# Patient Record
Sex: Female | Born: 1950 | Race: White | Hispanic: No | Marital: Married | Smoking: Former smoker
Health system: Southern US, Community
[De-identification: ages and names within clinical notes are randomized; demographics above are authoritative.]

## PROBLEM LIST (undated history)

## (undated) DIAGNOSIS — K5790 Diverticulosis of intestine, part unspecified, without perforation or abscess without bleeding: Secondary | ICD-10-CM

## (undated) DIAGNOSIS — J42 Unspecified chronic bronchitis: Secondary | ICD-10-CM

## (undated) DIAGNOSIS — Z5189 Encounter for other specified aftercare: Secondary | ICD-10-CM

## (undated) DIAGNOSIS — F324 Major depressive disorder, single episode, in partial remission: Secondary | ICD-10-CM

## (undated) DIAGNOSIS — M810 Age-related osteoporosis without current pathological fracture: Secondary | ICD-10-CM

## (undated) DIAGNOSIS — E119 Type 2 diabetes mellitus without complications: Secondary | ICD-10-CM

## (undated) DIAGNOSIS — E782 Mixed hyperlipidemia: Secondary | ICD-10-CM

## (undated) DIAGNOSIS — K859 Acute pancreatitis without necrosis or infection, unspecified: Secondary | ICD-10-CM

## (undated) DIAGNOSIS — K219 Gastro-esophageal reflux disease without esophagitis: Secondary | ICD-10-CM

## (undated) DIAGNOSIS — R011 Cardiac murmur, unspecified: Secondary | ICD-10-CM

## (undated) DIAGNOSIS — K2270 Barrett's esophagus without dysplasia: Secondary | ICD-10-CM

## (undated) DIAGNOSIS — G4733 Obstructive sleep apnea (adult) (pediatric): Secondary | ICD-10-CM

## (undated) DIAGNOSIS — M797 Fibromyalgia: Secondary | ICD-10-CM

## (undated) DIAGNOSIS — Z9989 Dependence on other enabling machines and devices: Secondary | ICD-10-CM

## (undated) DIAGNOSIS — K222 Esophageal obstruction: Secondary | ICD-10-CM

## (undated) DIAGNOSIS — G43909 Migraine, unspecified, not intractable, without status migrainosus: Secondary | ICD-10-CM

## (undated) DIAGNOSIS — M545 Low back pain, unspecified: Secondary | ICD-10-CM

## (undated) DIAGNOSIS — I10 Essential (primary) hypertension: Secondary | ICD-10-CM

## (undated) DIAGNOSIS — J45909 Unspecified asthma, uncomplicated: Secondary | ICD-10-CM

## (undated) DIAGNOSIS — I219 Acute myocardial infarction, unspecified: Secondary | ICD-10-CM

## (undated) DIAGNOSIS — N189 Chronic kidney disease, unspecified: Secondary | ICD-10-CM

## (undated) DIAGNOSIS — K589 Irritable bowel syndrome without diarrhea: Secondary | ICD-10-CM

## (undated) DIAGNOSIS — K529 Noninfective gastroenteritis and colitis, unspecified: Secondary | ICD-10-CM

## (undated) DIAGNOSIS — K760 Fatty (change of) liver, not elsewhere classified: Secondary | ICD-10-CM

## (undated) DIAGNOSIS — J189 Pneumonia, unspecified organism: Secondary | ICD-10-CM

## (undated) DIAGNOSIS — G4730 Sleep apnea, unspecified: Secondary | ICD-10-CM

## (undated) DIAGNOSIS — Z8719 Personal history of other diseases of the digestive system: Secondary | ICD-10-CM

## (undated) DIAGNOSIS — M1990 Unspecified osteoarthritis, unspecified site: Secondary | ICD-10-CM

## (undated) DIAGNOSIS — D649 Anemia, unspecified: Secondary | ICD-10-CM

## (undated) DIAGNOSIS — F419 Anxiety disorder, unspecified: Secondary | ICD-10-CM

## (undated) DIAGNOSIS — I2510 Atherosclerotic heart disease of native coronary artery without angina pectoris: Secondary | ICD-10-CM

## (undated) DIAGNOSIS — G8929 Other chronic pain: Secondary | ICD-10-CM

## (undated) DIAGNOSIS — H269 Unspecified cataract: Secondary | ICD-10-CM

## (undated) HISTORY — DX: Anxiety disorder, unspecified: F41.9

## (undated) HISTORY — DX: Noninfective gastroenteritis and colitis, unspecified: K52.9

## (undated) HISTORY — DX: Acute myocardial infarction, unspecified: I21.9

## (undated) HISTORY — DX: Age-related osteoporosis without current pathological fracture: M81.0

## (undated) HISTORY — DX: Esophageal obstruction: K22.2

## (undated) HISTORY — DX: Fibromyalgia: M79.7

## (undated) HISTORY — PX: CATARACT EXTRACTION W/ INTRAOCULAR LENS  IMPLANT, BILATERAL: SHX1307

## (undated) HISTORY — DX: Gastro-esophageal reflux disease without esophagitis: K21.9

## (undated) HISTORY — DX: Essential (primary) hypertension: I10

## (undated) HISTORY — DX: Major depressive disorder, single episode, in partial remission: F32.4

## (undated) HISTORY — PX: ESOPHAGEAL DILATION: SHX303

## (undated) HISTORY — DX: Atherosclerotic heart disease of native coronary artery without angina pectoris: I25.10

## (undated) HISTORY — DX: Mixed hyperlipidemia: E78.2

## (undated) HISTORY — DX: Fatty (change of) liver, not elsewhere classified: K76.0

## (undated) HISTORY — PX: CARPAL TUNNEL RELEASE: SHX101

## (undated) HISTORY — DX: Unspecified asthma, uncomplicated: J45.909

## (undated) HISTORY — PX: APPENDECTOMY: SHX54

## (undated) HISTORY — PX: ABDOMINAL HYSTERECTOMY: SHX81

## (undated) HISTORY — DX: Type 2 diabetes mellitus without complications: E11.9

## (undated) HISTORY — DX: Unspecified cataract: H26.9

## (undated) HISTORY — DX: Irritable bowel syndrome, unspecified: K58.9

## (undated) HISTORY — DX: Chronic kidney disease, unspecified: N18.9

## (undated) HISTORY — DX: Sleep apnea, unspecified: G47.30

## (undated) HISTORY — PX: UPPER GASTROINTESTINAL ENDOSCOPY: SHX188

## (undated) HISTORY — DX: Encounter for other specified aftercare: Z51.89

---

## 1969-03-20 HISTORY — PX: DILATION AND CURETTAGE OF UTERUS: SHX78

## 1969-03-20 HISTORY — PX: NASAL SEPTUM SURGERY: SHX37

## 1969-07-19 HISTORY — PX: HUMERUS FRACTURE SURGERY: SHX670

## 1970-07-20 HISTORY — PX: BUNIONECTOMY: SHX129

## 1983-07-21 HISTORY — PX: TUBAL LIGATION: SHX77

## 1989-03-20 DIAGNOSIS — J189 Pneumonia, unspecified organism: Secondary | ICD-10-CM

## 1989-03-20 HISTORY — PX: TOE SURGERY: SHX1073

## 1989-03-20 HISTORY — PX: CHOLECYSTECTOMY: SHX55

## 1989-03-20 HISTORY — DX: Pneumonia, unspecified organism: J18.9

## 2000-11-29 ENCOUNTER — Ambulatory Visit (HOSPITAL_COMMUNITY): Admission: RE | Admit: 2000-11-29 | Discharge: 2000-11-29 | Payer: Self-pay | Admitting: Cardiology

## 2002-11-03 ENCOUNTER — Encounter: Payer: Self-pay | Admitting: Unknown Physician Specialty

## 2002-11-03 ENCOUNTER — Encounter: Admission: RE | Admit: 2002-11-03 | Discharge: 2002-11-03 | Payer: Self-pay | Admitting: Unknown Physician Specialty

## 2004-05-27 ENCOUNTER — Ambulatory Visit: Payer: Self-pay | Admitting: Cardiology

## 2004-06-03 ENCOUNTER — Ambulatory Visit: Payer: Self-pay | Admitting: Cardiology

## 2005-02-18 ENCOUNTER — Encounter: Admission: RE | Admit: 2005-02-18 | Discharge: 2005-02-18 | Payer: Self-pay | Admitting: Orthopedic Surgery

## 2005-02-19 ENCOUNTER — Ambulatory Visit (HOSPITAL_BASED_OUTPATIENT_CLINIC_OR_DEPARTMENT_OTHER): Admission: RE | Admit: 2005-02-19 | Discharge: 2005-02-19 | Payer: Self-pay | Admitting: Orthopedic Surgery

## 2005-02-19 ENCOUNTER — Ambulatory Visit (HOSPITAL_COMMUNITY): Admission: RE | Admit: 2005-02-19 | Discharge: 2005-02-19 | Payer: Self-pay | Admitting: Orthopedic Surgery

## 2005-03-13 ENCOUNTER — Ambulatory Visit (HOSPITAL_COMMUNITY): Admission: RE | Admit: 2005-03-13 | Discharge: 2005-03-13 | Payer: Self-pay | Admitting: Orthopedic Surgery

## 2005-03-13 ENCOUNTER — Ambulatory Visit (HOSPITAL_BASED_OUTPATIENT_CLINIC_OR_DEPARTMENT_OTHER): Admission: RE | Admit: 2005-03-13 | Discharge: 2005-03-13 | Payer: Self-pay | Admitting: Orthopedic Surgery

## 2005-04-02 ENCOUNTER — Ambulatory Visit: Payer: Self-pay | Admitting: Psychiatry

## 2005-05-05 ENCOUNTER — Ambulatory Visit: Payer: Self-pay | Admitting: Psychiatry

## 2006-06-01 ENCOUNTER — Ambulatory Visit (HOSPITAL_BASED_OUTPATIENT_CLINIC_OR_DEPARTMENT_OTHER): Admission: RE | Admit: 2006-06-01 | Discharge: 2006-06-01 | Payer: Self-pay | Admitting: Urology

## 2006-07-20 DIAGNOSIS — K859 Acute pancreatitis without necrosis or infection, unspecified: Secondary | ICD-10-CM

## 2006-07-20 HISTORY — DX: Acute pancreatitis without necrosis or infection, unspecified: K85.90

## 2006-07-20 HISTORY — PX: ERCP: SHX60

## 2006-08-27 ENCOUNTER — Emergency Department (HOSPITAL_COMMUNITY): Admission: EM | Admit: 2006-08-27 | Discharge: 2006-08-27 | Payer: Self-pay | Admitting: Emergency Medicine

## 2007-03-14 ENCOUNTER — Encounter: Payer: Self-pay | Admitting: Pulmonary Disease

## 2007-03-16 ENCOUNTER — Ambulatory Visit: Payer: Self-pay | Admitting: Pulmonary Disease

## 2007-03-23 ENCOUNTER — Other Ambulatory Visit: Admission: RE | Admit: 2007-03-23 | Discharge: 2007-03-23 | Payer: Self-pay | Admitting: *Deleted

## 2007-04-03 ENCOUNTER — Encounter: Payer: Self-pay | Admitting: Pulmonary Disease

## 2007-04-03 ENCOUNTER — Ambulatory Visit (HOSPITAL_BASED_OUTPATIENT_CLINIC_OR_DEPARTMENT_OTHER): Admission: RE | Admit: 2007-04-03 | Discharge: 2007-04-03 | Payer: Self-pay | Admitting: Pulmonary Disease

## 2007-04-19 ENCOUNTER — Ambulatory Visit: Payer: Self-pay | Admitting: Pulmonary Disease

## 2007-04-27 ENCOUNTER — Ambulatory Visit: Payer: Self-pay | Admitting: Pulmonary Disease

## 2007-05-24 DIAGNOSIS — R51 Headache: Secondary | ICD-10-CM | POA: Insufficient documentation

## 2007-05-24 DIAGNOSIS — I10 Essential (primary) hypertension: Secondary | ICD-10-CM | POA: Insufficient documentation

## 2007-05-24 DIAGNOSIS — F411 Generalized anxiety disorder: Secondary | ICD-10-CM | POA: Insufficient documentation

## 2007-05-24 DIAGNOSIS — J45909 Unspecified asthma, uncomplicated: Secondary | ICD-10-CM | POA: Insufficient documentation

## 2007-05-24 DIAGNOSIS — G8929 Other chronic pain: Secondary | ICD-10-CM | POA: Insufficient documentation

## 2007-05-24 DIAGNOSIS — R519 Headache, unspecified: Secondary | ICD-10-CM | POA: Insufficient documentation

## 2007-05-24 DIAGNOSIS — G4733 Obstructive sleep apnea (adult) (pediatric): Secondary | ICD-10-CM | POA: Insufficient documentation

## 2007-05-24 DIAGNOSIS — G4700 Insomnia, unspecified: Secondary | ICD-10-CM | POA: Insufficient documentation

## 2007-05-24 DIAGNOSIS — G4710 Hypersomnia, unspecified: Secondary | ICD-10-CM | POA: Insufficient documentation

## 2007-05-24 DIAGNOSIS — E782 Mixed hyperlipidemia: Secondary | ICD-10-CM | POA: Insufficient documentation

## 2007-05-31 ENCOUNTER — Ambulatory Visit: Payer: Self-pay | Admitting: Pulmonary Disease

## 2007-11-28 ENCOUNTER — Ambulatory Visit: Payer: Self-pay | Admitting: Pulmonary Disease

## 2007-11-28 DIAGNOSIS — J310 Chronic rhinitis: Secondary | ICD-10-CM | POA: Insufficient documentation

## 2008-06-04 ENCOUNTER — Ambulatory Visit: Payer: Self-pay | Admitting: Pulmonary Disease

## 2008-06-11 ENCOUNTER — Telehealth: Payer: Self-pay | Admitting: Pulmonary Disease

## 2008-06-12 ENCOUNTER — Telehealth (INDEPENDENT_AMBULATORY_CARE_PROVIDER_SITE_OTHER): Payer: Self-pay | Admitting: *Deleted

## 2008-12-14 ENCOUNTER — Ambulatory Visit: Payer: Self-pay | Admitting: Pulmonary Disease

## 2009-01-14 ENCOUNTER — Telehealth: Payer: Self-pay | Admitting: Pulmonary Disease

## 2010-04-19 HISTORY — PX: TOE FUSION: SHX1070

## 2010-08-10 ENCOUNTER — Encounter: Payer: Self-pay | Admitting: *Deleted

## 2010-08-10 ENCOUNTER — Encounter: Payer: Self-pay | Admitting: Otolaryngology

## 2010-09-08 ENCOUNTER — Ambulatory Visit (INDEPENDENT_AMBULATORY_CARE_PROVIDER_SITE_OTHER): Payer: 59 | Admitting: Pulmonary Disease

## 2010-09-08 ENCOUNTER — Encounter: Payer: Self-pay | Admitting: Pulmonary Disease

## 2010-09-08 DIAGNOSIS — G4733 Obstructive sleep apnea (adult) (pediatric): Secondary | ICD-10-CM

## 2010-09-16 NOTE — Assessment & Plan Note (Signed)
Summary: re-evaluation for osa   CC:  F/u appt for OSA.  Pt states she never went to [facility] to be evaluated for dental appliance because ins wouldn't pay for it.  Pt states she is also not currently on a cpap.  Pt states she had surgery recently and was told "they had a hard time waking her up from the ansthesia."  Pt was instructed to f/u with Dr. Shelle Iron regarding her OSA. Marland Kitchen  History of Present Illness: the pt comes in today for further evaluation and treatment of osa.  She has a h/o mild osa, and has not been seen since 2010.  She was tried on cpap with very poor tolerance, and last visit we discussed trying bilevel while she was being evaluated for possible dental appliance.  She states the appliance was not covered by her insurance, and her local dentist is concerned about the feasibility due to missing back teeth.  She never tried the bilevel, and was concerned about costs.  She comes in today where she continues to have issues with her sleep, but feels her chronic pain may be more of an issue than anything else.  She has persistent daytime sleepiness as well.   Current Medications (verified): 1)  Singulair 10 Mg  Tabs (Montelukast Sodium) .... Take One Tab By Mouth Once Daily 2)  Alprazolam 1 Mg  Tabs (Alprazolam) .... Take 1 Tab By Mouth At Bedtime 3)  Lantus Solostar 100 Unit/ml Soln (Insulin Glargine) .... Take As Directed 4)  Hyzaar 100-25 Mg  Tabs (Losartan Potassium-Hctz) .... Take 1 Tablet By Mouth Once A Day 5)  Hyomax-Sr 0.375 Mg Xr12h-Tab (Hyoscyamine Sulfate) .... Take 1 Tablet By Mouth Once A Day 6)  Trazodone Hcl 50 Mg  Tabs (Trazodone Hcl) .... Take 1 Tab By Mouth At Bedtime 7)  Nexium 40 Mg Cpdr (Esomeprazole Magnesium) .... Take 1 Tablet By Mouth Once A Day 8)  Nuvigil 250 Mg Tabs (Armodafinil) .... Take 1 Tablet By Mouth Once A Day 9)  Vicodin 5-500 Mg Tabs (Hydrocodone-Acetaminophen) .... Take 1/2 Tab By Mouth Each Morning and 1 Tab By Mouth Each Evening 10)  Lidoderm 5 % Ptch  (Lidocaine) .... Use As Directed 11)  Biotin 5000 Mcg Caps (Biotin) .... Take 1 Tablet By Mouth Once A Day 12)  Cymbalta 60 Mg Cpep (Duloxetine Hcl) .... Take 1 Tablet By Mouth Once A Day 13)  Zyrtec Allergy 10 Mg Caps (Cetirizine Hcl) .... Take 1 Tablet By Mouth Once A Day 14)  Zocor 20 Mg Tabs (Simvastatin) .... Take 1 Tablet By Mouth Once A Day 15)  Probiotic  Caps (Probiotic Product) .... Take 1 Tablet By Mouth Once A Day 16)  Neurontin .... Take 1 Tab By Mouth At Bedtime  Allergies (verified): 1)  Sulfa 2)  * Ambien  Past History:  Past medical, surgical, family and social histories (including risk factors) reviewed, and no changes noted (except as noted below).  Past Medical History: Reviewed history from 05/24/2007 and no changes required. Hyperlipidemia Hypertension  Past Surgical History: R foot surgery---bone fusion Oct 2011.   Family History: Reviewed history and no changes required.  Social History: Reviewed history and no changes required.  Review of Systems       The patient complains of joint stiffness or pain.  The patient denies shortness of breath with activity, shortness of breath at rest, productive cough, non-productive cough, coughing up blood, chest pain, irregular heartbeats, acid heartburn, indigestion, loss of appetite, weight change, abdominal pain,  difficulty swallowing, sore throat, tooth/dental problems, headaches, nasal congestion/difficulty breathing through nose, sneezing, itching, ear ache, anxiety, depression, hand/feet swelling, rash, change in color of mucus, and fever.    Vital Signs:  Patient profile:   60 year old female Height:      62 inches Weight:      150.38 pounds BMI:     27.60 O2 Sat:      93 % on Room air Temp:     98.0 degrees F oral Pulse rate:   91 / minute BP sitting:   174 / 72  (left arm) Cuff size:   regular  Vitals Entered By: Arman Filter LPN (September 08, 2010 1:45 PM)  O2 Flow:  Room air CC: F/u appt for  OSA.  Pt states she never went to [facility] to be evaluated for dental appliance because ins wouldn't pay for it.  Pt states she is also not currently on a cpap.  Pt states she had surgery recently and was told "they had a hard time waking her up from the ansthesia."  Pt was instructed to f/u with Dr. Shelle Iron regarding her OSA.  Comments Medications reviewed with patient Arman Filter LPN  September 08, 2010 1:45 PM    Physical Exam  General:  ow female in nad Nose:  mild turbinate hypertrophy, but patent Mouth:  mild elongation of soft palate, no significant narrowing posteriorly Lungs:  clear to auscultation Heart:  rrr Extremities:  no edema or cyanosis  Neurologic:  alert, does not appear sleepy, moves all 4.    Impression & Recommendations:  Problem # 1:  OBSTRUCTIVE SLEEP APNEA (ICD-327.23) the pt has known mild osa, and has been very intolerant of cpap in the past.  She has disrupted sleep at night with sleepiness during the day, however has known chronic pain syndrome that she complains keeps her awake most nights.  I have reiterated to her this is not a CV risk given its mild nature.  She really does not have upper airway anatomy that is amenable to surgery, and is missing enough teeth that would make a dental appliance questionable (according to her dentist).  After a long discussion with her, she would like to try bilevel to see if her tolerance would be better.  I doubt she will do much better than with cpap, but am willing to give it a chance.  Medications Added to Medication List This Visit: 1)  Alprazolam 1 Mg Tabs (Alprazolam) .... Take 1 tab by mouth at bedtime 2)  Hyomax-sr 0.375 Mg Xr12h-tab (Hyoscyamine sulfate) .... Take 1 tablet by mouth once a day 3)  Trazodone Hcl 50 Mg Tabs (Trazodone hcl) .... Take 1 tab by mouth at bedtime 4)  Vicodin 5-500 Mg Tabs (Hydrocodone-acetaminophen) .... Take 1/2 tab by mouth each morning and 1 tab by mouth each evening 5)  Lidoderm 5 % Ptch  (Lidocaine) .... Use as directed 6)  Biotin 5000 Mcg Caps (Biotin) .... Take 1 tablet by mouth once a day 7)  Cymbalta 60 Mg Cpep (Duloxetine hcl) .... Take 1 tablet by mouth once a day 8)  Zyrtec Allergy 10 Mg Caps (Cetirizine hcl) .... Take 1 tablet by mouth once a day 9)  Zocor 20 Mg Tabs (Simvastatin) .... Take 1 tablet by mouth once a day 10)  Probiotic Caps (Probiotic product) .... Take 1 tablet by mouth once a day 11)  Neurontin  .... Take 1 tab by mouth at bedtime  Other Orders: Est. Patient Level IV (  16109) DME Referral (DME)  Patient Instructions: 1)  will try bipap to see if it helps sleep, but I suspect your pain may be more of an issue. 2)  followup with me in 4weeks, but call if tolerance issues with bipap 3)  work on weight loss.   Immunization History:  Tetanus/Td Immunization History:    Tetanus/Td:  historical (03/20/2010)  Influenza Immunization History:    Influenza:  historical (03/20/2010)

## 2010-09-26 ENCOUNTER — Encounter: Payer: Self-pay | Admitting: Pulmonary Disease

## 2010-10-08 ENCOUNTER — Ambulatory Visit: Payer: 59 | Admitting: Pulmonary Disease

## 2010-11-04 ENCOUNTER — Encounter: Payer: Self-pay | Admitting: Pulmonary Disease

## 2010-11-05 ENCOUNTER — Encounter: Payer: Self-pay | Admitting: Pulmonary Disease

## 2010-11-05 ENCOUNTER — Ambulatory Visit (INDEPENDENT_AMBULATORY_CARE_PROVIDER_SITE_OTHER): Payer: 59 | Admitting: Pulmonary Disease

## 2010-11-05 VITALS — BP 168/80 | HR 76 | Temp 98.3°F | Ht 62.0 in | Wt 152.0 lb

## 2010-11-05 DIAGNOSIS — G4733 Obstructive sleep apnea (adult) (pediatric): Secondary | ICD-10-CM

## 2010-11-05 NOTE — Progress Notes (Signed)
  Subjective:    Patient ID: [name], female    DOB: 1950/10/18, 60 y.o.   MRN: [id number]  HPI The pt comes in today for f/u of her osa.  She had been intolerant of cpap in the past, and recently was tried on bilevel.  She comes in today where she is doing much better with this device.  She is wearing every night, and has no issues with the pressure.  She has some mask leaks that she believes will improve once allergy season is over?  She is sleeping much better, and is very happy with her daytime alertness.    Review of Systems  Constitutional: Negative for fever and unexpected weight change.  HENT: Positive for ear pain, congestion, sore throat, trouble swallowing and sinus pressure. Negative for nosebleeds, rhinorrhea, sneezing, dental problem and postnasal drip.   Eyes: Negative for redness and itching.  Respiratory: Positive for chest tightness and shortness of breath. Negative for cough and wheezing.   Cardiovascular: Negative for palpitations and leg swelling.  Gastrointestinal: Negative for nausea and vomiting.  Genitourinary: Negative for dysuria.  Musculoskeletal: Negative for joint swelling.  Skin: Negative for rash.  Neurological: Positive for headaches.  Hematological: Bruises/bleeds easily.  Psychiatric/Behavioral: Negative for dysphoric mood. The patient is not nervous/anxious.        Objective:   Physical Exam Wd female in nad No skin breakdown or pressure necrosis from cpap mask . No edema or cyanosis noted  Alert, does not appear sleepy, moves all 4        Assessment & Plan:

## 2010-11-05 NOTE — Patient Instructions (Signed)
Stay on bipap, but call if you continue to have mask fit issues. Work on weight loss followup with me in 6mos

## 2010-11-05 NOTE — Assessment & Plan Note (Signed)
The pt is doing very well with bipap, and feels much better.  She will continue with this, and will consider a different mask if the leak issues persist.  Right now, she does not feel it is a significant issue.  I have also asked her to work on weight loss.

## 2010-12-02 NOTE — Procedures (Signed)
NAMESHANVI, [name]                 ACCOUNT NO.:  [id number]   MEDICAL RECORD NO.:  [id number]          PATIENT TYPE:  OUT   LOCATION:  SLEEP CENTER                 FACILITY:  [facility]   PHYSICIAN:  Barbaraann Share, MD,FCCPDATE OF BIRTH:  02-11-1949   DATE OF STUDY:  04/03/2007                            NOCTURNAL POLYSOMNOGRAM   REFERRING PHYSICIAN:   INDICATION FOR STUDY:  Hypersomnia with sleep apnea.   EPWORTH SLEEPINESS SCORE:  14.   MEDICATIONS:   SLEEP ARCHITECTURE:  Patient had a total sleep time of 366 minutes with  very little slow wave sleep and also REM.  Sleep onset latency was  normal.  REM onset was fairly rapid at 53 minutes.  Sleep efficiency was  decreased at 85%.   RESPIRATORY DATA:  Patient was found to have 68 obstructive hypopneas  and 3 central apneas for an apnea-hypopnea index of 12 events per hours.  The events occurred primarily during REM, and there was moderate snoring  noted throughout.   OXYGEN DATA:  There was O2 desaturation as low as 85% with the patient's  obstructive events.   CARDIAC DATA:  No clinically significant arrhythmias were noted.   MOVEMENT-PARASOMNIA:  None.   IMPRESSIONS-RECOMMENDATIONS:  Mild obstructive sleep apnea/hypopnea  syndrome with an apnea-hypopnea index of 12 events per hour and O2  desaturation as low as 85%.  The events occurred primarily during REM,  and it should be noted the patient's degree of sleep apnea may be under-  estimated because of her very low quantity of slow wave sleep and REM.  Treatment for this degree of sleep apnea can include weight loss alone,  if applicable, upper airway surgery, oral appliance, and also CPAP.  Clinical correlation is suggested.     Barbaraann Share, MD,FCCP  Diplomate, American Board of Sleep  Medicine  Electronically Signed    KMC/MEDQ  D:  04/19/2007 08:15:49  T:  04/19/2007 10:05:32  Job:  [id number]

## 2010-12-02 NOTE — Procedures (Signed)
[name], [name]                 ACCOUNT NO.:  [id number]   MEDICAL RECORD NO.:  [id number]          PATIENT TYPE:  OUT   LOCATION:  SLEEP CENTER                 [facility]   PHYSICIAN:  Barbaraann Share, MD,FCCPDATE OF BIRTH:  1950-12-16   DATE OF STUDY:  04/03/2007                          MULTIPLE SLEEP LATENCY TEST   REFERRING PHYSICIAN:   INDICATION FOR STUDY:  Hypersomnia, with questionable narcolepsy, code  347.00.   EPWORTH SLEEPINESS SCORE:  14.   BMI:   MEDICATIONS:   NAP 1:  8 a.m.  Sleep onset latency is 4.5 minutes, and latency to REM  is N/A.   NAP 2:  10 a.m.  Sleep onset latency is 2.5 minutes, and REM onset is  N/A.   NAP 3:  12 noon.  Sleep onset latency is 3.5 minutes, and REM onset is  N/A.   NAP 4:  2 PM  Sleep onset latency is 2 minutes. with REM onset at 10  minutes.   NAP 5:  4 p.m.  Sleep onset latency is 7 minutes, and REM onset is N/A.    MEAN SLEEP LATENCY:  3.9 minutes   NUMBER OF REM EPISODES:  1   COMMENTS:  The patient underwent nocturnal polysomnography prior to this  MSLT.  She was found to have mild obstructive sleep apnea, with an  apnea/hypopnea index of 12 events per hour, with the majority of the  events occurring during REM.   IMPRESSIONS/RECOMMENDATIONS:  Objective daytime sleepiness, with a mean  sleep onset latency of 3.9 minutes and one sleep onset REM period noted  during the entire study.  I suspect that the majority of this may be  due to the patient's obstructive sleep apnea, though I cannot with 100%  certainty exclude the possibility of narcolepsy.  Clinical correlation  is suggested after treating the patient's obstructive sleep apnea.      Barbaraann Share, MD,FCCP  Diplomate, American Board of Sleep  Medicine  Electronically Signed     KMC/MEDQ  D:  04/19/2007 08:35:15  T:  04/19/2007 10:13:20  Job:  [id number]

## 2010-12-05 NOTE — Op Note (Signed)
[name], [name]                 ACCOUNT NO.:  [id number]   MEDICAL RECORD NO.:  [id number]          PATIENT TYPE:  AMB   LOCATION:  [facility]                          FACILITY:  [facility]   PHYSICIAN:  Katy Fitch. Sypher, M.D. DATE OF BIRTH:  08-06-1950   DATE OF PROCEDURE:  03/13/2005  DATE OF DISCHARGE:                                 OPERATIVE REPORT   PREOPERATIVE DIAGNOSIS:  Entrapment neuropathy, left median nerve at carpal  tunnel.   POSTOPERATIVE DIAGNOSIS:  Entrapment neuropathy, left median nerve at carpal  tunnel.   OPERATION:  Release of left transverse carpal ligament.   OPERATIONS:  Josephine Igo, M.D.   ASSISTANT:  Molly Maduro Dasnoit PA-C.   ANESTHESIA:  General by LMA.   SUPERVISING ANESTHESIOLOGIST:  Bedelia Person, M.D.   INDICATIONS:  [name] is a 60 year old woman referred for evaluation and  management of numb and painful hands.  She is status post release of her  right transcarpal ligament and right thumb A1 pulley with good results.  She  now presents for similar surgery to release the left transcarpal ligament.  Preoperatively, she has had electrodiagnostic studies that document  significant median neuropathy.   PROCEDURE:  [name] was brought to the operating room and placed in  supine position upon the operating table.   Following induction of general anesthesia by LMA technique, the left arm was  prepped with Betadine soap and solution and sterilely draped.   Following exsanguination of the limb with an Esmarch bandage, an arterial  tourniquet on the proximal brachium was inflated to 220 mmHg.   Procedure commenced with a short incision in the line of the ring finger and  the palm.  Subcutaneous tissues were carefully divided, revealing the palmar  fascia.  This was split longitudinally to reveal the common extensor branch  of the median nerve.  These were followed back to the transcarpal ligament,  which were carefully isolated from the median nerve.   The ligament was then  released along its ulnar border with scissors extending into the distal  forearm.  This widely opened the carpal canal.   No mass or other predicaments were noted.   Bleeding points along the margin of the released ligament were  electrocauterized with bipolar current followed by repair of the skin with  intradermal 3-0 Prolene suture.   A compressive dressing was applied with a volar plaster splint, maintaining  the wrist in 5 degrees of dorsiflexion.   NOTE:  For aftercare, she was provided a prescription for Vicodin 5 mg one  p.o. q.4-6 h. p.r.n. pain.  She is noted have allergy to sulfa, erythromycin  and Augmentin and medication reconciliation has been completed.      Katy Fitch Sypher, M.D.  Electronically Signed     RVS/MEDQ  D:  03/13/2005  T:  03/14/2005  Job:  [id number]

## 2010-12-05 NOTE — Cardiovascular Report (Signed)
[place]  Patient:    [name], [name]                        MRN: [id number] Proc. Date: 11/29/00 Adm. Date:  [id number] Attending:  Norman Clay CC:         Caryl Comes. Slotnick, M.D., [place] [facility]   Cardiac Catheterization  PROCEDURE:  Cardiac catheterization.  HISTORY:  A 60 year old female with positive family history of heart disease, hyperlipidemia, who has had previous abnormal treadmill test.  The stress echo was normal, but she continued to have severe chest pain that was unresponsive to medical trial.  Because of severe anxiety and ongoing chest pain, catheterization was advised to exclude coronary disease.  CARDIOLOGIST:  Darden Palmer., M.D.  COMMENTS ABOUT PROCEDURE:  The patient tolerated the procedure well without complications.  At the end of the procedure, the patient had good hemostasis and pedal pulses present.  Please see the attached catheterization log for remainder of the details and medicines used.  HEMODYNAMIC DATA:  Aorta post contrast 137/64, LV post contrast 137/18.  ANGIOGRAPHIC DATA:  Left ventriculogram:  Performed in the 30 degree RAO projection.  The aortic valve was normal.  The mitral valve was normal.  The left ventricle appears normal in size.  The estimated ejection fraction was 60 to 65%.   Coronary arteries arise and distribute normally.  The left main coronary artery appears normal.  The left anterior descending appears normal.  The circumflex has two marginal branches and appears normal.  The right coronary artery has no significant disease.  IMPRESSION: 1. Normal left ventricular function. 2. Normal coronary arteries.  No significant coronary artery disease    identified.  RECOMMENDATIONS:  Workup and evaluation for other sources of pain with special attention to anxiety management. DD:  11/29/00 TD:  11/29/00 Job: [id number]

## 2010-12-05 NOTE — Op Note (Signed)
[name], [name]                 ACCOUNT NO.:  [id number]   MEDICAL RECORD NO.:  [id number]          PATIENT TYPE:  AMB   LOCATION:  [facility]                         FACILITY:  [facility]   PHYSICIAN:  Lindaann Slough, M.D.  DATE OF BIRTH:  Mar 29, 1951   DATE OF PROCEDURE:  06/01/2006  DATE OF DISCHARGE:                                 OPERATIVE REPORT   PREOPERATIVE DIAGNOSIS:  Pelvic pain syndrome.   POSTOPERATIVE DIAGNOSIS:  Pelvic pain syndrome.   PROCEDURE DONE:  Cystoscopy hydraulic bladder distension, instillation of  Marcaine and Pyridium and urethral dilation.   SURGEON:  Dr. Brunilda Payor.   ANESTHESIA:  General.   INDICATION:  The patient is a 60 years old female who has been complaining  of pelvic pain.  She was then treated by Dr. Randell Patient with antibiotics.  However, she continues to complain of the same symptoms.  She is scheduled  for cystoscopy and hydraulic bladder distension.   Under general anesthesia the patient was prepped and draped and placed in  the dorsal lithotomy position.  A #22 Wappler cystoscope was inserted in the  bladder.  The bladder mucosa is normal.  There is no stone or tumor in the  bladder.  The ureteral orifices are in normal position and shape with clear  efflux.  There is no evidence of submucosal hemorrhage.  The bladder was  then distended for about 5 minutes.  The bladder capacity is about 650 mL.  The cystoscope was then removed.  The urethra was dilated up to #30-French,  then 400 mg of Pyridium in 15 mL 0.5% Marcaine were instilled in the  bladder.     The patient tolerated the procedure well and left the OR in satisfactory  condition to post anesthesia care unit.      Lindaann Slough, M.D.  Electronically Signed     MN/MEDQ  D:  06/01/2006  T:  06/01/2006  Job:  [id number]   cc:   Almedia Balls. Randell Patient, M.D.  Fax: [phone]

## 2010-12-05 NOTE — Op Note (Signed)
[name], [name]                 ACCOUNT NO.:  [id number]   MEDICAL RECORD NO.:  [id number]          PATIENT TYPE:  AMB   LOCATION:  DSC                          FACILITY:  [facility]   PHYSICIAN:  Katy Fitch. Sypher, M.D. DATE OF BIRTH:  11-02-1950   DATE OF PROCEDURE:  02/19/2005  DATE OF DISCHARGE:                                 OPERATIVE REPORT   PREOP DIAGNOSES:  1.  Right carpal tunnel syndrome.  2.  Left carpal tunnel syndrome.  3.  Stenosing tenosynovitis of right thumb left thumb and right long finger.   POSTOPERATIVE DIAGNOSES:  1.  Right carpal tunnel syndrome.  2.  Left carpal tunnel syndrome.  3.  Stenosing tenosynovitis of right thumb left thumb and right long finger.   OPERATION:  1.  Release of right transverse carpal ligament.  2.  Through separate incision, release of right thumb A1 pulley.  3.  Injection of right long finger flexor sheath at the level of A1 pulley      with Depo-Medrol 1% lidocaine.  4.  Injection of left carpal canal slash ulnar bursa with Depo-Medrol      lidocaine.  5.  Injection of left thumb flexor sheath at A1 pulley with Depo-Medrol and      1% lidocaine.   OPERATING SURGEON:  Katy Fitch. Sypher, M.D.   ASSISTANT:  Marveen Reeks. Dasnoit, P.A.-C.   ANESTHESIA:  General by LMA.   SUPERVISING ANESTHESIOLOGIST:  Dr. Autumn Patty.   INDICATIONS:  [name] is a 54-year woman referred through the courtesy  of Dr. Lowanda Foster for evaluation and management of hand numbness and  triggering of her thumbs.   She has a history of chronic stenosing tenosynovitis affecting multiple  digits including both thumbs and recently her right long finger. She has  failed nonoperative measures. Electrodiagnostic studies have confirmed  bilateral carpal tunnel syndrome. Due to failure to respond to nonoperative  measures, she is brought to the operating room at this time for release of  right thumb A1 pulley and release of her right transverse carpal  ligament.  In the preoperative holding area, we discussed injection of her other digits  that were mildly effected with stenosing tenosynovitis and injection of her  left ulnar bursa.   After informed consent, she is brought to the operating room at this time.   PROCEDURE:  Miciah Shealy was brought to the operating room and placed in  supine position on the operating table.   Following anesthesia consultation with Dr. Sampson Goon, general anesthesia by  LMA technique was induced.   The right arm was prepped with Betadine soap solution, sterilely draped. The  left thumb and wrist region were prepped with alcohol swab.   The left thumb flexor sheath and left ulnar bursa were injected with a  mixture of Depo-Medrol and lidocaine.  1 mL of 40 milligrams per mL Depo-  Medrol was mixed with a total of 4 mL of 1% plain lidocaine.   A total mixture of 2 mL was injected into the ulnar bursa with standard  technique proximal to the wrist flexion  crease. The flexor sheath was  injected with a 0.75 mL injection distending the flexor sheath and the right  long finger was likewise injected with approximately 1 mL of the mixture  through the A1 pulley distending the flexor sheath.   Thereafter, the right upper extremity was exsanguinated with Esmarch bandage  and arterial tourniquet on the proximal brachium inflated to 230 mmHg.   Procedure commenced with a short transverse incision directly over the  palpably thickened thumb A1 pulley.  The subcutaneous tissues were carefully  divided taking care to gently retract the radial proper digital nerve. The  pulley was isolated, retractors placed, pulley split with scalpel and  scissors. Thereafter full active range of motion the IP joint of the thumb  was recovered.   Attention was directed to the palm. A short incision was fashioned in the  line of the ring finger. The subcutaneous tissue was carefully divided  revealing the palmar fascia. This was  split longitudinally to revealed the  common sensory branch of the median nerve. These were followed back to  transverse carpal ligament which was carefully isolated from the median  nerve.   The ligament was released along its ulnar border extending into the distal  forearm. This widely opened the carpal canal. No masses or other  predicaments were noted.   The wound was then repaired with intradermal 3-0 Prolene as was the wound at  the base of the thumb. Steri-Strips were applied followed by postoperative  analgesia with 0.25% Marcaine.   The tourniquet was released with immediate capillary fill to the fingers and  thumb. Ms. Claytor was awakened from her anesthesia and transferred to recovery  room with stable vital signs. For aftercare, she is provided prescription  for Percocet 5 milligrams 1 or 2 tablets p.o. 4 to 6 hours p.r.n. pain 20  tablets without refill. She is advised that she will have numbness of her  fingers and thumb that were injected with Depo-Medrol and lidocaine.       RVS/MEDQ  D:  02/19/2005  T:  02/19/2005  Job:  [id number]   cc:   Caryl Comes. Slotnick, M.D.  [address] [place]  [place] [address]  Fax: [phone]

## 2010-12-05 NOTE — H&P (Signed)
[name]. [facility]  Patient:    [name], [name]                          MRN: [id number] Adm. Date:  11/29/00 Attending:  Darden Palmer., M.D. CC:         [name], M.D., [facility]   History and Physical  CHIEF COMPLAINT: Admitted for catheterization and evaluation of chest pain.  HISTORY OF PRESENT ILLNESS: The patient is a 60 year old female, who I was asked to see in consultation by Dr. Gae Freeman for evaluation of chest pain on Nov 25, 2000.  [name] has a history of hypertension and has a very strongly positive premature cardiac disease.  She has had episodic chest pain over the years.  Two years ago she was evaluated with chest pain and had an abnormal treadmill, and had a catheterization done at [facility] which was reported as "normal."  She has continued to have some episodic chest pain since that time.  More recently she had worsening chest pain described as pressure in her chest and feeling like something was going to break in her chest, particularly when she would wake up in the morning.  One month ago she had a left-sided chest pain that radiated down her left arm and had some numbness in the left side of her face.  She had a headache, and was diagnosed possibly with shingles.  A stress test was set up with Dr. Gae Freeman, with findings of 1.5-2 mm of lateral ST segment depression.  The patient was referred to Dr. Burnetta Freeman on November 15, 2000 at [facility] and she had a stress echocardiogram but her work load was only 6.6 METS.  The patient has continued to have chest pain since that time.  It is very difficult to tell what the pain is from.  She complains the pain is worse with stress and she reports a great deal of work-related stress.  She is unable to exercise to a significant degree because of painful feet and previous feet operations.  She has had continued chest discomfort.   It is not worse with food and it not pleuritic.  She also describes some atypical type of chest pain, described as fleeting or sharp or tingling chest pains.  When she was evaluated in the office after reviewing all the data it was felt that because of her increased anxiety and her early family history of heart disease that repeat catheterization should be done to exclude interval coronary artery disease as the cause of her pain.  PAST MEDICAL HISTORY:  1. Hyperlipidemia, which is currently under treatment.  2. Blood pressure was also elevated in the office and she has a prior history     of hypertension.  3. History of treated asthma.  4. History of history in the past.  5. History of esophageal reflux.  PAST SURGICAL HISTORY:  1. Abdominal hysterectomy.  2. Cesarean section.  4. Fracture of right arm.  5. Foot surgery three times on the right and one on the left.  6. Cholecystectomy.  ALLERGIES:  1. SULFA.  2. ERYTHROMYCIN.  CURRENT MEDICATIONS:  1. Hyzaar 100/25 mg q.d.  2. Prilosec 40 mg q.d.  3. Zoloft 50 mg q.d.  4. Singulair 10 mg q.d.  5. Zyrtec 10 mg q.d.  6. Zocor 10 mg q.d.  7. Premarin 0.625 mg q.d.  8. Reglan 10 mg q.h.s.  9. Advair 100/50 mg b.i.d. 10. Flonase inhaler b.i.d.  FAMILY HISTORY: Father died of heart failure at age 54.  Mother died at age one month after coronary artery bypass grafting.  Three brothers are living, one brother has had stenting, another brother has had kidney stones.  A sister age 65 has hyperlipidemia.  She has two sons, age 58 and 28.  SOCIAL HISTORY: She works for the [employer] site and has a case load and describes an increased amount of situational stress with this.  She quit smoking seven years ago but smoked one pack per day for 20 years.  She does not use alcohol to excess.  She has been married to her second husband for the past 20 years.  REVIEW OF SYSTEMS: She describes significant situational stress.   She saw a psychiatrist years ago and describes a great deal of tension and anxiety, and has been treated for depression in the past.  She describes panic attacks and anxiety disorder.  She has significant esophageal reflux and symptoms of dyspepsia.  She has no diarrhea or constipation.  She does have some urinary frequency.  She has frequent allergies and also has a known history of asthma. She has no claudication but does describe foot pain that hinders her from walking.  She describes a prior history of having taken Redux and Pondimin several years ago, but has had an echocardiogram since then showing no valvular problems.  The remainder of the Review Of Systems is unremarkable except as noted above.  PHYSICAL EXAMINATION:  GENERAL: She is an anxious-appearing woman.  VITAL SIGNS: Weight 158-1/2 pounds.  Blood pressure 174/90 sitting, 170/86 standing.  Pulse 92.  SKIN: Warm and dry, without lesions.  HEENT: She wears glasses.  EOMI.  PERRLA.  C&S clear.  Fundi unremarkable. Pharynx negative.  NECK: Supple without masses.  No thyromegaly, no carotid bruits.  LUNGS: Clear to A&P.  CARDIOVASCULAR: Normal S1 and S2.  No S3, S4, or murmur.  ABDOMEN: Soft, nontender.  No masses, no organomegaly.  BREAST: Not examined.  LYMPH NODES: Unremarkable.  EXTREMITIES: Femoral and distal pulses present and 2+.  LABORATORY DATA: A 12 lead echocardiogram shows minor nonspecific changes at rest.  Laboratories are pending at the time of this dictation.  IMPRESSION:  1. Chest pain with some atypical features, rule out unstable angina or     coronary artery disease.     a. History of catheterization two years ago.     b. Recent negative stress echocardiogram but with poor exercise capacity.  2. Hyperlipidemia, under treatment.  3. Hypertension, not well controlled at this time.  4. Anxiety and depression with history of panic disorder.  5. Esophageal reflux.  6. Asthma by  history.  PLAN: The patient is brought in at this time for same-day cardiac  catheterization.  The procedure was discussed with the patient fully including risk of MI, death, or CVA, and she is willing to proceed.  Also, possibly of angioplasty and stenting was discussed with the patient. DD:  11/25/00 TD:  11/26/00 Job: [id number]

## 2011-02-16 ENCOUNTER — Telehealth: Payer: Self-pay | Admitting: Pulmonary Disease

## 2011-02-16 NOTE — Telephone Encounter (Signed)
Spoke with pt informed her we do have the cmn form [facility]; waiting for sig from Dr Shelle Iron as soon as signed we wll fax to [facility]. Kandice Hams

## 2011-02-16 NOTE — Telephone Encounter (Signed)
Pt advised I will forward this message to Alida to have CMN completed by [facility] and faxed to [facility]. Alida do you have this CMN? Carron Curie, CMA

## 2011-04-09 ENCOUNTER — Encounter: Payer: Self-pay | Admitting: Pulmonary Disease

## 2011-04-09 ENCOUNTER — Ambulatory Visit (INDEPENDENT_AMBULATORY_CARE_PROVIDER_SITE_OTHER): Payer: 59 | Admitting: Pulmonary Disease

## 2011-04-09 VITALS — BP 138/72 | HR 86 | Temp 98.3°F | Ht 62.0 in | Wt 149.2 lb

## 2011-04-09 DIAGNOSIS — G4733 Obstructive sleep apnea (adult) (pediatric): Secondary | ICD-10-CM

## 2011-04-09 NOTE — Progress Notes (Signed)
  Subjective:    Patient ID: [name], female    DOB: May 31, 1951, 60 y.o.   MRN: [id number]  HPI Patient comes in today for followup of her obstructive sleep apnea.  She is wearing BiPAP compliantly, and has found a mask that fits her fairly well.  She needs to keep up with the mask changes in the appropriate interval in order for it to seal affectively.  She feels that she is sleeping well and has adequate daytime alertness.   Review of Systems  Constitutional: Negative for fever and unexpected weight change.  HENT: Negative for ear pain, nosebleeds, congestion, sore throat, rhinorrhea, sneezing, trouble swallowing, dental problem, postnasal drip and sinus pressure.   Eyes: Negative for redness and itching.  Respiratory: Negative for cough, chest tightness, shortness of breath and wheezing.   Cardiovascular: Negative for palpitations and leg swelling.  Gastrointestinal: Negative for nausea and vomiting.  Genitourinary: Negative for dysuria.  Musculoskeletal: Negative for joint swelling.  Skin: Negative for rash.  Neurological: Negative for headaches.  Hematological: Does not bruise/bleed easily.  Psychiatric/Behavioral: Negative for dysphoric mood. The patient is not nervous/anxious.        Objective:   Physical Exam Overweight female in no acute distress Skin breakdown or pressure necrosis from the CPAP mask Lower extremities without edema, no cyanosis Alert, does not appear to be sleepy, moves all 4 extremities.        Assessment & Plan:

## 2011-04-09 NOTE — Assessment & Plan Note (Signed)
The patient is doing well on BiPAP, and feels that she is sleeping well with adequate daytime alertness.  I have asked her to keep up with her mask changes and supplies, and to work on weight loss.  She will follow up with me in one year.

## 2011-04-09 NOTE — Patient Instructions (Signed)
Stay on bipap and keep up with supplies Work on weight loss followup with me in one year.

## 2011-05-13 ENCOUNTER — Ambulatory Visit: Payer: 59 | Admitting: Pulmonary Disease

## 2011-07-15 ENCOUNTER — Telehealth: Payer: Self-pay | Admitting: Pulmonary Disease

## 2011-07-15 NOTE — Telephone Encounter (Signed)
I spoke with pt and she states that her family doctor is the one prescribing her nuvigil for her. She states she has a new insurance and requires a 90 day supply be sent to [facility]. She states the nurse advised her that they are requesting a PA done on the nuvigil and he PCP is wanting to know if we will do the PA since we have documentation of her OSA. I advised her that we can't do PA on a medication if we are not prescribing it. Pt then asked if I can send all her records to her PCP for her so they can do the PA. I advised her since we do not have a release on file that she will need to sign one before we can do that. Pt states she would like one mailed to her so she can fill out and will have her pcp fax it over. I have mailed form to pt and nothing further was needed

## 2012-04-08 ENCOUNTER — Ambulatory Visit: Payer: 59 | Admitting: Pulmonary Disease

## 2012-04-12 ENCOUNTER — Ambulatory Visit: Payer: 59 | Admitting: Pulmonary Disease

## 2012-04-19 HISTORY — PX: COLONOSCOPY: SHX174

## 2012-05-04 ENCOUNTER — Ambulatory Visit: Payer: 59 | Admitting: Pulmonary Disease

## 2012-11-21 DIAGNOSIS — R072 Precordial pain: Secondary | ICD-10-CM

## 2012-12-05 ENCOUNTER — Encounter: Payer: Self-pay | Admitting: Cardiology

## 2012-12-05 ENCOUNTER — Ambulatory Visit (INDEPENDENT_AMBULATORY_CARE_PROVIDER_SITE_OTHER): Payer: 59 | Admitting: Cardiology

## 2012-12-05 ENCOUNTER — Telehealth: Payer: Self-pay | Admitting: Cardiology

## 2012-12-05 ENCOUNTER — Encounter: Payer: Self-pay | Admitting: *Deleted

## 2012-12-05 ENCOUNTER — Other Ambulatory Visit: Payer: Self-pay | Admitting: Cardiology

## 2012-12-05 VITALS — BP 176/78 | HR 78 | Ht 63.0 in | Wt 148.0 lb

## 2012-12-05 DIAGNOSIS — R9439 Abnormal result of other cardiovascular function study: Secondary | ICD-10-CM

## 2012-12-05 DIAGNOSIS — R0989 Other specified symptoms and signs involving the circulatory and respiratory systems: Secondary | ICD-10-CM

## 2012-12-05 DIAGNOSIS — R0609 Other forms of dyspnea: Secondary | ICD-10-CM

## 2012-12-05 DIAGNOSIS — G4733 Obstructive sleep apnea (adult) (pediatric): Secondary | ICD-10-CM

## 2012-12-05 DIAGNOSIS — Z01810 Encounter for preprocedural cardiovascular examination: Secondary | ICD-10-CM

## 2012-12-05 DIAGNOSIS — E782 Mixed hyperlipidemia: Secondary | ICD-10-CM

## 2012-12-05 DIAGNOSIS — R0600 Dyspnea, unspecified: Secondary | ICD-10-CM

## 2012-12-05 DIAGNOSIS — I10 Essential (primary) hypertension: Secondary | ICD-10-CM

## 2012-12-05 NOTE — Patient Instructions (Signed)
Your physician recommends that you continue on your current medications as directed. Please refer to the Current Medication list given to you today. Your physician recommends that you return for lab work today at [facility]. A chest x-ray takes a picture of the organs and structures inside the chest, including the heart, lungs, and blood vessels. This test can show several things, including, whether the heart is enlarges; whether fluid is building up in the lungs; and whether pacemaker / defibrillator leads are still in place. Your physician has requested that you have a cardiac catheterization. Cardiac catheterization is used to diagnose and/or treat various heart conditions. Doctors may recommend this procedure for a number of different reasons. The most common reason is to evaluate chest pain. Chest pain can be a symptom of coronary artery disease (CAD), and cardiac catheterization can show whether plaque is narrowing or blocking your heart's arteries. This procedure is also used to evaluate the valves, as well as measure the blood flow and oxygen levels in different parts of your heart. For further information please visit [email or web address] Please follow instruction sheet, as given.

## 2012-12-05 NOTE — Assessment & Plan Note (Signed)
Outlined above. Patient reports increased dyspnea on exertion with intermittent atypical chest pain over the last few months. Cardiac risk factor profile includes relatively long-standing diabetes mellitus, hypertension, also hyperlipidemia. She did have a normal cardiac catheterization 12 years ago, although these results would not apply at this particular time. We have discussed risks and benefits of repeat diagnostic cardiac catheterization to exclude development of obstructive CAD, and she is in agreement to proceed.

## 2012-12-05 NOTE — Assessment & Plan Note (Signed)
Currently not on statin therapy. With concurrent type 2 diabetes mellitus, this would be a consideration. Lipids have been followed by Dr. Reuel Boom most recently.

## 2012-12-05 NOTE — Assessment & Plan Note (Signed)
Blood pressure elevated today, she does report compliance with her medications. May need further titration over time.

## 2012-12-05 NOTE — Telephone Encounter (Signed)
Left heart cath in [facility] lab dx: abnormal stress test and dyspnea on exertion with Dr. Clifton James @11 :30 am on Dec 09, 2012

## 2012-12-05 NOTE — Assessment & Plan Note (Signed)
Followed by Dr Clance 

## 2012-12-05 NOTE — Progress Notes (Signed)
Clinical Summary [name] is a 62 y.o.female referred for cardiology consultation by Dr. Reuel Freeman. She is here with her husband. She states that over the last few months she has noted increasing shortness of breath with typical activities, house chores, playing with her dog outside. Also feels intermittent heaviness in her chest, not always exertional. States these symptoms are different from her typical asthma.  GXT done on 5/5, interpreted by Dr. Myrtis Freeman, revealed abnormal ST segment changes - 1 mm flat ST segment depression (leads not indicated) with hypertensive response, maximum workload 10.1 METs. No chest pain was reported.  She does have a history of normal coronary arteries by cardiac catheterization in May of 2002 by Dr. Donnie Freeman.  Medical history reviewed including diabetes mellitus for approximately 10 years, hypertension, hyperlipidemia, and OSA.   Allergies  Allergen Reactions  . Remeron (Mirtazapine)     Makes her too sleepy  . Zolpidem Tartrate     amnesia  . Sulfonamide Derivatives Rash    Mouth sores    Current Outpatient Prescriptions  Medication Sig Dispense Refill  . ALPRAZolam (XANAX) 1 MG tablet Take 1 tablet by mouth at bedtime       . Armodafinil (NUVIGIL) 250 MG tablet Take 250 mg by mouth daily as needed.       . Biotin 5000 MCG TABS Take 1 tablet by mouth once daily       . cetirizine (ZYRTEC) 10 MG tablet Take 10 mg by mouth daily.        . Cholecalciferol (VITAMIN D-3 PO) Take 1 capsule by mouth daily as needed.       . doxycycline (VIBRA-TABS) 100 MG tablet Take 1 tablet by mouth 2 (two) times daily.      . DULoxetine (CYMBALTA) 60 MG capsule Take 60 mg by mouth daily.        Marland Kitchen gabapentin (NEURONTIN) 300 MG capsule Take 300 mg by mouth daily as needed.       Marland Kitchen HYDROcodone-acetaminophen (NORCO/VICODIN) 5-325 MG per tablet Take 1 tablet by mouth every 6 (six) hours as needed for pain.      Marland Kitchen insulin glargine (LANTUS) 100 UNIT/ML injection Take as directed        . lidocaine (LIDODERM) 5 % Use as directed       . losartan-hydrochlorothiazide (HYZAAR) 100-25 MG per tablet Take 1 tablet by mouth daily.        . montelukast (SINGULAIR) 10 MG tablet Take 10 mg by mouth daily.        Marland Kitchen NEXIUM 40 MG capsule Take 1 capsule by mouth daily.      . Probiotic Product (PROBIOTIC PO) Take 1 capsule by mouth once daily       . traZODone (DESYREL) 50 MG tablet Take 200 mg by mouth at bedtime.        No current facility-administered medications for this visit.    Past Medical History  Diagnosis Date  . Essential hypertension, benign   . Mixed hyperlipidemia   . GERD (gastroesophageal reflux disease)   . Anxiety   . Type 2 diabetes mellitus   . IBS (irritable bowel syndrome)   . Asthma   . OSA (obstructive sleep apnea)   . Fibromyalgia     Past Surgical History  Procedure Laterality Date  . Foot surgery  10/11    First MTP joint  . Colonoscopy  10/13    Dr. Teena Freeman - tubular adenomas and diverticulosis  . Cholecystectomy    .  Appendectomy    . Abdominal hysterectomy    . Carpal tunnel release    . Cataract extraction      Family History  Problem Relation Age of Onset  . Stroke Mother   . Cirrhosis Father     Social History [name] reports that she quit smoking about 19 years ago. Her smoking use included Cigarettes. She has a 12 pack-year smoking history. She has never used smokeless tobacco. [name] reports that she does not drink alcohol.  Review of Systems No palpitations. Uses inhalers occasionally for her asthma, no recent hospitalizations. Reports no bleeding episodes, no orthopnea or PND. No claudication.  Physical Examination Filed Vitals:   12/05/12 1344  BP: 176/78  Pulse: 78   Filed Weights   12/05/12 1344  Weight: 148 lb (67.132 kg)   No acute distress. HEENT: Conjunctiva and lids normal, oropharynx clear. Neck: Supple, no elevated JVP or carotid bruits, no thyromegaly. Lungs: Diminished but clear to auscultation,  nonlabored breathing at rest. Cardiac: Regular rate and rhythm, no S3 or significant systolic murmur, no pericardial rub. Abdomen: Soft, nontender, bowel sounds present, no guarding or rebound. Extremities: No pitting edema, distal pulses 2+. Skin: Warm and dry. Musculoskeletal: No kyphosis. Neuropsychiatric: Alert and oriented x3, affect grossly appropriate.   Problem List and Plan   Abnormal stress test Outlined above. Patient reports increased dyspnea on exertion with intermittent atypical chest pain over the last few months. Cardiac risk factor profile includes relatively long-standing diabetes mellitus, hypertension, also hyperlipidemia. She did have a normal cardiac catheterization 12 years ago, although these results would not apply at this particular time. We have discussed risks and benefits of repeat diagnostic cardiac catheterization to exclude development of obstructive CAD, and she is in agreement to proceed.  Essential hypertension, benign Blood pressure elevated today, she does report compliance with her medications. May need further titration over time.  Mixed hyperlipidemia Currently not on statin therapy. With concurrent type 2 diabetes mellitus, this would be a consideration. Lipids have been followed by Dr. Reuel Freeman most recently.  OBSTRUCTIVE SLEEP APNEA Followed by Dr. Shelle Freeman.    [name], M.D., F.A.C.C.

## 2012-12-08 ENCOUNTER — Telehealth: Payer: Self-pay

## 2012-12-08 NOTE — Telephone Encounter (Signed)
[employer] YNWG#[id number] FOR [facility] 12/09/12

## 2012-12-08 NOTE — Telephone Encounter (Signed)
Patient called and asked about lab work and chest xray done, would like to know if they were received and how they looked.  Patient is scheduled for a cath tomorrow.  Patient would like someone to call her, she is anxious about cath procedure.

## 2012-12-09 ENCOUNTER — Encounter (HOSPITAL_COMMUNITY)
Admission: RE | Disposition: A | Discharge status: Hospice | Payer: Self-pay | Source: Ambulatory Visit | Attending: Cardiovascular Disease

## 2012-12-09 ENCOUNTER — Encounter (HOSPITAL_BASED_OUTPATIENT_CLINIC_OR_DEPARTMENT_OTHER)
Admission: RE | Disposition: A | Discharge status: Hospice | Payer: Self-pay | Source: Ambulatory Visit | Attending: Cardiovascular Disease

## 2012-12-09 ENCOUNTER — Inpatient Hospital Stay (HOSPITAL_BASED_OUTPATIENT_CLINIC_OR_DEPARTMENT_OTHER)
Admission: RE | Admit: 2012-12-09 | Discharge: 2012-12-09 | Disposition: A | Discharge status: Hospice | Payer: Medicare Other | Source: Ambulatory Visit | Attending: Cardiovascular Disease | Admitting: Cardiovascular Disease

## 2012-12-09 ENCOUNTER — Ambulatory Visit (HOSPITAL_COMMUNITY)
Admission: RE | Admit: 2012-12-09 | Discharge: 2012-12-10 | Disposition: A | Discharge status: Hospice | Payer: Medicare Other | Source: Ambulatory Visit | Attending: Cardiovascular Disease | Admitting: Cardiovascular Disease

## 2012-12-09 ENCOUNTER — Encounter (HOSPITAL_COMMUNITY): Payer: Self-pay | Admitting: General Practice

## 2012-12-09 DIAGNOSIS — E119 Type 2 diabetes mellitus without complications: Secondary | ICD-10-CM | POA: Insufficient documentation

## 2012-12-09 DIAGNOSIS — I2510 Atherosclerotic heart disease of native coronary artery without angina pectoris: Secondary | ICD-10-CM

## 2012-12-09 DIAGNOSIS — Z955 Presence of coronary angioplasty implant and graft: Secondary | ICD-10-CM

## 2012-12-09 DIAGNOSIS — E785 Hyperlipidemia, unspecified: Secondary | ICD-10-CM | POA: Insufficient documentation

## 2012-12-09 DIAGNOSIS — I10 Essential (primary) hypertension: Secondary | ICD-10-CM | POA: Insufficient documentation

## 2012-12-09 DIAGNOSIS — G4733 Obstructive sleep apnea (adult) (pediatric): Secondary | ICD-10-CM | POA: Insufficient documentation

## 2012-12-09 DIAGNOSIS — G4710 Hypersomnia, unspecified: Secondary | ICD-10-CM

## 2012-12-09 DIAGNOSIS — I200 Unstable angina: Secondary | ICD-10-CM | POA: Insufficient documentation

## 2012-12-09 DIAGNOSIS — Z79899 Other long term (current) drug therapy: Secondary | ICD-10-CM | POA: Insufficient documentation

## 2012-12-09 DIAGNOSIS — R9439 Abnormal result of other cardiovascular function study: Secondary | ICD-10-CM | POA: Insufficient documentation

## 2012-12-09 DIAGNOSIS — G8929 Other chronic pain: Secondary | ICD-10-CM

## 2012-12-09 DIAGNOSIS — R0602 Shortness of breath: Secondary | ICD-10-CM | POA: Insufficient documentation

## 2012-12-09 DIAGNOSIS — E782 Mixed hyperlipidemia: Secondary | ICD-10-CM | POA: Insufficient documentation

## 2012-12-09 HISTORY — DX: Obstructive sleep apnea (adult) (pediatric): G47.33

## 2012-12-09 HISTORY — PX: PERCUTANEOUS CORONARY STENT INTERVENTION (PCI-S): SHX5485

## 2012-12-09 HISTORY — DX: Unspecified osteoarthritis, unspecified site: M19.90

## 2012-12-09 HISTORY — DX: Migraine, unspecified, not intractable, without status migrainosus: G43.909

## 2012-12-09 HISTORY — DX: Personal history of other diseases of the digestive system: Z87.19

## 2012-12-09 HISTORY — DX: Unspecified chronic bronchitis: J42

## 2012-12-09 HISTORY — DX: Dependence on other enabling machines and devices: Z99.89

## 2012-12-09 HISTORY — DX: Low back pain: M54.5

## 2012-12-09 HISTORY — DX: Low back pain, unspecified: M54.50

## 2012-12-09 HISTORY — DX: Other chronic pain: G89.29

## 2012-12-09 HISTORY — DX: Cardiac murmur, unspecified: R01.1

## 2012-12-09 HISTORY — DX: Barrett's esophagus without dysplasia: K22.70

## 2012-12-09 HISTORY — DX: Acute pancreatitis without necrosis or infection, unspecified: K85.90

## 2012-12-09 HISTORY — DX: Pneumonia, unspecified organism: J18.9

## 2012-12-09 HISTORY — DX: Anemia, unspecified: D64.9

## 2012-12-09 LAB — GLUCOSE, CAPILLARY: Glucose-Capillary: 146 mg/dL — ABNORMAL HIGH (ref 70–99)

## 2012-12-09 LAB — POCT ACTIVATED CLOTTING TIME: Activated Clotting Time: 432 seconds

## 2012-12-09 SURGERY — JV LEFT HEART CATHETERIZATION WITH CORONARY ANGIOGRAM
Anesthesia: Moderate Sedation

## 2012-12-09 SURGERY — PERCUTANEOUS CORONARY STENT INTERVENTION (PCI-S)
Anesthesia: LOCAL

## 2012-12-09 MED ORDER — HYDROCHLOROTHIAZIDE 25 MG PO TABS
25.0000 mg | ORAL_TABLET | Freq: Every day | ORAL | Status: DC
  Filled 2012-12-09: qty 1

## 2012-12-09 MED ORDER — LIDOCAINE HCL (PF) 1 % IJ SOLN
INTRAMUSCULAR | Status: AC
  Filled 2012-12-09: qty 30

## 2012-12-09 MED ORDER — ALPRAZOLAM 0.25 MG PO TABS: 0.5000 mg | ORAL_TABLET | Freq: Every evening | ORAL | Status: DC | PRN

## 2012-12-09 MED ORDER — MIDAZOLAM HCL 2 MG/2ML IJ SOLN
INTRAMUSCULAR | Status: AC
  Filled 2012-12-09: qty 2

## 2012-12-09 MED ORDER — ONDANSETRON HCL 4 MG/2ML IJ SOLN: 4.0000 mg | Freq: Four times a day (QID) | INTRAMUSCULAR | Status: DC | PRN

## 2012-12-09 MED ORDER — INSULIN GLARGINE 100 UNIT/ML ~~LOC~~ SOLN
40.0000 [IU] | Freq: Every day | SUBCUTANEOUS | Status: DC
  Filled 2012-12-09: qty 0.6

## 2012-12-09 MED ORDER — TRAZODONE HCL 100 MG PO TABS
200.0000 mg | ORAL_TABLET | Freq: Every day | ORAL | Status: DC
  Administered 2012-12-09: 22:00:00 200 mg via ORAL
  Filled 2012-12-09 (×2): qty 2

## 2012-12-09 MED ORDER — LOSARTAN POTASSIUM 25 MG PO TABS: 25.0000 mg | ORAL_TABLET | Freq: Every day | ORAL | Status: DC

## 2012-12-09 MED ORDER — LOSARTAN POTASSIUM 50 MG PO TABS
100.0000 mg | ORAL_TABLET | Freq: Every day | ORAL | Status: DC
  Filled 2012-12-09: qty 2

## 2012-12-09 MED ORDER — DIAZEPAM 5 MG PO TABS
5.0000 mg | ORAL_TABLET | Freq: Once | ORAL | Status: AC
  Administered 2012-12-09: 5 mg via ORAL

## 2012-12-09 MED ORDER — FENTANYL CITRATE 0.05 MG/ML IJ SOLN
INTRAMUSCULAR | Status: AC
  Filled 2012-12-09: qty 2

## 2012-12-09 MED ORDER — LORATADINE 10 MG PO TABS
10.0000 mg | ORAL_TABLET | Freq: Every day | ORAL | Status: DC
  Filled 2012-12-09: qty 1

## 2012-12-09 MED ORDER — ATORVASTATIN CALCIUM 40 MG PO TABS
40.0000 mg | ORAL_TABLET | Freq: Every day | ORAL | Status: DC
  Administered 2012-12-09: 40 mg via ORAL
  Filled 2012-12-09 (×3): qty 1

## 2012-12-09 MED ORDER — BIVALIRUDIN 250 MG IV SOLR
INTRAVENOUS | Status: AC
  Filled 2012-12-09: qty 250

## 2012-12-09 MED ORDER — SODIUM CHLORIDE 0.9 % IV SOLN: INTRAVENOUS | Status: DC

## 2012-12-09 MED ORDER — ACETAMINOPHEN 325 MG PO TABS: 650.0000 mg | ORAL_TABLET | ORAL | Status: DC | PRN

## 2012-12-09 MED ORDER — MONTELUKAST SODIUM 10 MG PO TABS
10.0000 mg | ORAL_TABLET | Freq: Every day | ORAL | Status: DC
  Administered 2012-12-09: 10 mg via ORAL
  Filled 2012-12-09 (×2): qty 1

## 2012-12-09 MED ORDER — CLOPIDOGREL BISULFATE 300 MG PO TABS
ORAL_TABLET | ORAL | Status: AC
  Filled 2012-12-09: qty 2

## 2012-12-09 MED ORDER — CLOPIDOGREL BISULFATE 75 MG PO TABS
75.0000 mg | ORAL_TABLET | Freq: Every day | ORAL | Status: DC
  Administered 2012-12-10: 10:00:00 75 mg via ORAL

## 2012-12-09 MED ORDER — METOPROLOL TARTRATE 25 MG PO TABS
25.0000 mg | ORAL_TABLET | Freq: Two times a day (BID) | ORAL | Status: DC
  Administered 2012-12-09 – 2012-12-10 (×2): 25 mg via ORAL
  Filled 2012-12-09 (×2): qty 1

## 2012-12-09 MED ORDER — HYDROCHLOROTHIAZIDE 25 MG PO TABS: 25.0000 mg | ORAL_TABLET | Freq: Every day | ORAL | Status: DC

## 2012-12-09 MED ORDER — DULOXETINE HCL 60 MG PO CPEP
60.0000 mg | ORAL_CAPSULE | Freq: Every day | ORAL | Status: DC
  Administered 2012-12-09: 60 mg via ORAL
  Filled 2012-12-09 (×2): qty 1

## 2012-12-09 MED ORDER — HEPARIN (PORCINE) IN NACL 2-0.9 UNIT/ML-% IJ SOLN
INTRAMUSCULAR | Status: AC
  Filled 2012-12-09: qty 1000

## 2012-12-09 MED ORDER — VERAPAMIL HCL 2.5 MG/ML IV SOLN
INTRAVENOUS | Status: AC
  Filled 2012-12-09: qty 2

## 2012-12-09 MED ORDER — SODIUM CHLORIDE 0.9 % IV SOLN: INTRAVENOUS | Status: AC

## 2012-12-09 MED ORDER — LOSARTAN POTASSIUM-HCTZ 100-25 MG PO TABS: 1.0000 | ORAL_TABLET | Freq: Every day | ORAL | Status: DC

## 2012-12-09 MED ORDER — ASPIRIN 81 MG PO CHEW
81.0000 mg | CHEWABLE_TABLET | Freq: Every day | ORAL | Status: DC
  Administered 2012-12-10: 81 mg via ORAL
  Filled 2012-12-09: qty 1

## 2012-12-09 MED ORDER — ASPIRIN 81 MG PO CHEW
324.0000 mg | CHEWABLE_TABLET | ORAL | Status: AC
  Administered 2012-12-09: 324 mg via ORAL

## 2012-12-09 MED ORDER — NITROGLYCERIN 1 MG/10 ML FOR IR/CATH LAB
INTRA_ARTERIAL | Status: AC
  Filled 2012-12-09: qty 10

## 2012-12-09 MED ORDER — HYDROCODONE-ACETAMINOPHEN 5-325 MG PO TABS
1.0000 | ORAL_TABLET | Freq: Two times a day (BID) | ORAL | Status: DC | PRN
  Administered 2012-12-10: 1 via ORAL

## 2012-12-09 NOTE — Interval H&P Note (Signed)
History and Physical Interval Note:  12/09/2012 12:38 PM  [name]  has presented today for cardiac cath with the diagnosis of abnormal stress test.  The various methods of treatment have been discussed with the patient and family. After consideration of risks, benefits and other options for treatment, the patient has consented to  Procedure(s): [facility] LEFT HEART CATHETERIZATION WITH CORONARY ANGIOGRAM (N/A) as a surgical intervention .  The patient's history has been reviewed, patient examined, no change in status, stable for surgery.  I have reviewed the patient's chart and labs.  Questions were answered to the patient's satisfaction.     Jarrad Mclees

## 2012-12-09 NOTE — OR Nursing (Signed)
Dr Clifton James with husband to discuss results and treatment plan

## 2012-12-09 NOTE — OR Nursing (Signed)
+  Allen's test right hand 

## 2012-12-09 NOTE — H&P (View-Only) (Signed)
 Clinical Summary [name] is a 62 y.o.female referred for cardiology consultation by Dr. Daniel. She is here with her husband. She states that over the last few months she has noted increasing shortness of breath with typical activities, house chores, playing with her dog outside. Also feels intermittent heaviness in her chest, not always exertional. States these symptoms are different from her typical asthma.  GXT done on 5/5, interpreted by Dr. Katz, revealed abnormal ST segment changes - 1 mm flat ST segment depression (leads not indicated) with hypertensive response, maximum workload 10.1 METs. No chest pain was reported.  She does have a history of normal coronary arteries by cardiac catheterization in May of 2002 by Dr. Tilley.  Medical history reviewed including diabetes mellitus for approximately 10 years, hypertension, hyperlipidemia, and OSA.   Allergies  Allergen Reactions  . Remeron (Mirtazapine)     Makes her too sleepy  . Zolpidem Tartrate     amnesia  . Sulfonamide Derivatives Rash    Mouth sores    Current Outpatient Prescriptions  Medication Sig Dispense Refill  . ALPRAZolam (XANAX) 1 MG tablet Take 1 tablet by mouth at bedtime       . Armodafinil (NUVIGIL) 250 MG tablet Take 250 mg by mouth daily as needed.       . Biotin 5000 MCG TABS Take 1 tablet by mouth once daily       . cetirizine (ZYRTEC) 10 MG tablet Take 10 mg by mouth daily.        . Cholecalciferol (VITAMIN D-3 PO) Take 1 capsule by mouth daily as needed.       . doxycycline (VIBRA-TABS) 100 MG tablet Take 1 tablet by mouth 2 (two) times daily.      . DULoxetine (CYMBALTA) 60 MG capsule Take 60 mg by mouth daily.        . gabapentin (NEURONTIN) 300 MG capsule Take 300 mg by mouth daily as needed.       . HYDROcodone-acetaminophen (NORCO/VICODIN) 5-325 MG per tablet Take 1 tablet by mouth every 6 (six) hours as needed for pain.      . insulin glargine (LANTUS) 100 UNIT/ML injection Take as directed        . lidocaine (LIDODERM) 5 % Use as directed       . losartan-hydrochlorothiazide (HYZAAR) 100-25 MG per tablet Take 1 tablet by mouth daily.        . montelukast (SINGULAIR) 10 MG tablet Take 10 mg by mouth daily.        . NEXIUM 40 MG capsule Take 1 capsule by mouth daily.      . Probiotic Product (PROBIOTIC PO) Take 1 capsule by mouth once daily       . traZODone (DESYREL) 50 MG tablet Take 200 mg by mouth at bedtime.        No current facility-administered medications for this visit.    Past Medical History  Diagnosis Date  . Essential hypertension, benign   . Mixed hyperlipidemia   . GERD (gastroesophageal reflux disease)   . Anxiety   . Type 2 diabetes mellitus   . IBS (irritable bowel syndrome)   . Asthma   . OSA (obstructive sleep apnea)   . Fibromyalgia     Past Surgical History  Procedure Laterality Date  . Foot surgery  10/11    First MTP joint  . Colonoscopy  10/13    Dr. Benson - tubular adenomas and diverticulosis  . Cholecystectomy    .   Appendectomy    . Abdominal hysterectomy    . Carpal tunnel release    . Cataract extraction      Family History  Problem Relation Age of Onset  . Stroke Mother   . Cirrhosis Father     Social History Ms. [name] reports that she quit smoking about 19 years ago. Her smoking use included Cigarettes. She has a 12 pack-year smoking history. She has never used smokeless tobacco. Ms. [name] reports that she does not drink alcohol.  Review of Systems No palpitations. Uses inhalers occasionally for her asthma, no recent hospitalizations. Reports no bleeding episodes, no orthopnea or PND. No claudication.  Physical Examination Filed Vitals:   12/05/12 1344  BP: 176/78  Pulse: 78   Filed Weights   12/05/12 1344  Weight: 148 lb (67.132 kg)   No acute distress. HEENT: Conjunctiva and lids normal, oropharynx clear. Neck: Supple, no elevated JVP or carotid bruits, no thyromegaly. Lungs: Diminished but clear to auscultation,  nonlabored breathing at rest. Cardiac: Regular rate and rhythm, no S3 or significant systolic murmur, no pericardial rub. Abdomen: Soft, nontender, bowel sounds present, no guarding or rebound. Extremities: No pitting edema, distal pulses 2+. Skin: Warm and dry. Musculoskeletal: No kyphosis. Neuropsychiatric: Alert and oriented x3, affect grossly appropriate.   Problem List and Plan   Abnormal stress test Outlined above. Patient reports increased dyspnea on exertion with intermittent atypical chest pain over the last few months. Cardiac risk factor profile includes relatively long-standing diabetes mellitus, hypertension, also hyperlipidemia. She did have a normal cardiac catheterization 12 years ago, although these results would not apply at this particular time. We have discussed risks and benefits of repeat diagnostic cardiac catheterization to exclude development of obstructive CAD, and she is in agreement to proceed.  Essential hypertension, benign Blood pressure elevated today, she does report compliance with her medications. May need further titration over time.  Mixed hyperlipidemia Currently not on statin therapy. With concurrent type 2 diabetes mellitus, this would be a consideration. Lipids have been followed by Dr. Daniel most recently.  OBSTRUCTIVE SLEEP APNEA Followed by Dr. Clance.    [name], M.D., F.A.C.C.   

## 2012-12-09 NOTE — CV Procedure (Signed)
   Cardiac Catheterization Operative Report  [name] [id number] 5/23/20142:27 PM Donzetta Sprung, MD  Procedure Performed:  1. PTCA/DES x 1 mid LAD    Operator: Verne Carrow, MD  Indication:   62 yo female with DM, HTN, HLD with recent exertional dyspnea/CP c/w class III angina. Exercise stress test with ischemic EKG changes. Diagnostic cath this am in outpatient cath lab with severe mid LAD stenosis.                                      Procedure Details: The risks, benefits, complications, treatment options, and expected outcomes were discussed with the patient. The patient and/or family concurred with the proposed plan, giving informed consent. The patient was brought to the inpatient cath lab from the outpatient lab. The patient was further sedated with Versed and Fentanyl. There was a 5 [place] sheath present in the right radial artery. The area was prepped and draped in a sterile fashion. I exchanged the sheath for a 6 [place] system. She was given 600 mg Plavix po x 1. A bolus of Angiomax was given and a drip was started. When the ACT was greater than 200, I engaged the left main with a JL3.5 guiding catheter. I then advanced a BMW wire into the distal LAD. The lesion was pre-dilated with a 2.0 x 8 mm balloon x 2. I then carefully positioned and deployed a 2.25 x 16 mm Promus Premier DES in the mid LAD. This was post-dilated with a 2.25 x 12 mm [place] balloon x 1. There was an excellent angiographic result.   There were no immediate complications. The patient was taken to the recovery area in stable condition.   Hemodynamic Findings: Central aortic pressure: 132/59  Impression: 1. Unstable angina (class III angina) with severe mid LAD stenosis 2. Successful PTCA/DES x 1 mid LAD  Recommendations: She will need dual anti-platelet therapy with ASA and Plavix for at least one year. Continue statin and beta blocker.        Complications:  None; patient tolerated the procedure  well.

## 2012-12-09 NOTE — CV Procedure (Addendum)
    Cardiac Catheterization Operative Report  [name] [id number] 5/23/20141:02 PM Donzetta Sprung, MD  Procedure Performed:  1. Left Heart Catheterization 2. Selective Coronary Angiography 3. Left ventricular angiogram  Operator: Verne Carrow, MD  Arterial access site:  Right radial artery.   Indication: 62 yo female with history of DM, HTN, HLD with recent dyspnea and chest pain with minimal exertion felt to represent angina. Exercise stress test with ischemic EKG changes.                                  Procedure Details: The risks, benefits, complications, treatment options, and expected outcomes were discussed with the patient. The patient and/or family concurred with the proposed plan, giving informed consent. The patient was brought to the cath lab after IV hydration was begun and oral premedication was given. The patient was further sedated with Versed and Fentanyl. The right wrist was assessed with an Allens test which was positive. The right wrist was prepped and draped in a sterile fashion. 1% lidocaine was used for local anesthesia. Using the modified Seldinger access technique, a 5 French sheath was placed in the right radial artery. 3 mg Verapamil was given through the sheath. 3500 units IV heparin was given. Standard diagnostic catheters were used to perform selective coronary angiography. A pigtail catheter was used to perform a left ventricular angiogram. The sheath was left in place.   There were no immediate complications. The patient was taken to the recovery area in stable condition.   Hemodynamic Findings: Central aortic pressure:132/61 Left ventricular pressure: 130/9/12  Angiographic Findings:  Left main: No obstructive disease.   Left Anterior Descending Artery: Large caliber vessel with diffuse 20% proximal stenosis. The mid vessel has a discreet 95% stenosis. The distal vessel has mild plaque.   Circumflex Artery: Moderate caliber vessel with  20% proximal stenosis. The first obtuse marginal branch is moderate in caliber with no stenosis.   Right Coronary Artery: Large caliber dominant vessel with 25% mid stenosis, mild luminal irregularities distally.   Left Ventricular Angiogram: LVEF 65%.   Impression: 1. Single vessel CAD 2. Class III unstable angina 3. Preserved LV systolic function  Recommendations: Will load with Plavix 600 mg po x 1. Will plan PCI of the mid LAD.        Complications:  None. The patient tolerated the procedure well.

## 2012-12-10 DIAGNOSIS — I2510 Atherosclerotic heart disease of native coronary artery without angina pectoris: Secondary | ICD-10-CM

## 2012-12-10 DIAGNOSIS — I10 Essential (primary) hypertension: Secondary | ICD-10-CM

## 2012-12-10 DIAGNOSIS — E782 Mixed hyperlipidemia: Secondary | ICD-10-CM

## 2012-12-10 LAB — BASIC METABOLIC PANEL
CO2: 29 mEq/L (ref 19–32)
Calcium: 9.4 mg/dL (ref 8.4–10.5)
Chloride: 101 mEq/L (ref 96–112)
GFR calc Af Amer: >90 mL/min (ref >90)
Sodium: 139 mEq/L (ref 135–145)

## 2012-12-10 LAB — CBC
HCT: 36.3 % (ref 36.0–46.0)
MCHC: 33.6 g/dL (ref 30.0–36.0)
RDW: 12.5 % (ref 11.5–15.5)
WBC: 7.4 10*3/uL (ref 4.0–10.5)

## 2012-12-10 MED ORDER — NITROGLYCERIN 0.4 MG SL SUBL: 0.4000 mg | SUBLINGUAL_TABLET | SUBLINGUAL | Status: DC | PRN

## 2012-12-10 MED ORDER — ATORVASTATIN CALCIUM 40 MG PO TABS: 40.0000 mg | ORAL_TABLET | Freq: Every day | ORAL | Status: DC

## 2012-12-10 MED ORDER — METOPROLOL TARTRATE 25 MG PO TABS: 25.0000 mg | ORAL_TABLET | Freq: Two times a day (BID) | ORAL | Status: DC

## 2012-12-10 MED ORDER — CLOPIDOGREL BISULFATE 75 MG PO TABS: 75.0000 mg | ORAL_TABLET | Freq: Every day | ORAL | Status: DC

## 2012-12-10 MED ORDER — LIVING WELL WITH DIABETES BOOK
Freq: Once | Status: AC
  Administered 2012-12-10: 01:00:00
  Filled 2012-12-10: qty 1

## 2012-12-10 MED ORDER — ASPIRIN 81 MG PO CHEW: 81.0000 mg | CHEWABLE_TABLET | Freq: Every day | ORAL | Status: DC

## 2012-12-10 NOTE — Discharge Summary (Signed)
Physician Discharge Summary  Patient ID: [name] MRN: [id number] DOB/AGE: Nov 27, 1950 62 y.o.  Admit date: 12/09/2012 Discharge date: 12/10/2012  Primary Discharge Diagnosis 1. CAD: S/P PCTA/DES to mid LAD 5.23.2014  Secondary Discharge Diagnosis 1. Hypertension 2. Diabetes 3. Hyperlipidemia 4. OSA  Significant Diagnostic Studies: 1. Cardiac Cath 12/09/2012 Angiographic Findings:  Left main: No obstructive disease.  Left Anterior Descending Artery: Large caliber vessel with diffuse 20% proximal stenosis. The mid vessel has a discreet 95% stenosis. The distal vessel has mild plaque.  Circumflex Artery: Moderate caliber vessel with 20% proximal stenosis. The first obtuse marginal branch is moderate in caliber with no stenosis.  Right Coronary Artery: Large caliber dominant vessel with 25% mid stenosis, mild luminal irregularities distally.  Left Ventricular Angiogram: LVEF 65%.  Impression:  1. Single vessel CAD  2. Class III unstable angina  3. Preserved LV systolic function  2. PCI: 12/09/2012 1. Unstable angina (class III angina) with severe mid LAD stenosis  2. Successful PTCA/DES x 1 mid LAD 2.25 x 16 mm Promus Premier DES in the mid LAD.  Hospital Course: [name] is a 62 year old patient of Dr. Diona Browner admitted for diagnostic cardiac catheterization in the setting of abnormal GXT with ST segment changes greater than 1 mm flat ST segment depression with hypertensive response. He was symptomatic with intermittent atypical chest pain over the last few months.   Patient was admitted to come hospital for outpatient cardiac catheterization which demonstrated single vessel LAD disease at 95% in the mid vessel. The patient Axis I PTCA with drug-eluting stent placement. The patient was placed on dual antiplatelet therapy, statin, beta blocker, and tolerated procedure and medication implementation well. Recommendations for dual antiplatelet therapy for one year. Patient was seen and  examined by Dr.  Bing on day of discharge and found to be stable. He will followup with Dr. Diona Browner for ongoing management as outpatient in our [place] office.  Discharge Exam: Blood pressure 144/59, pulse 58, temperature 97.9 F (36.6 C), temperature source Oral, resp. rate 16, height 5\' 3"  (1.6 m), weight 157 lb 3 oz (71.3 kg), SpO2 97.00%.      Lab Results  Component Value Date   WBC 7.4 12/10/2012   HGB 12.2 12/10/2012   HCT 36.3 12/10/2012   MCV 96.0 12/10/2012   PLT 300 12/10/2012    Recent Labs Lab 12/10/12 0525  NA 139  K 3.8  CL 101  CO2 29  BUN 16  CREATININE 0.72  CALCIUM 9.4  GLUCOSE 145*   FOLLOW UP PLANS AND APPOINTMENTS     Discharge Orders   Future Orders Complete By Expires     Amb Referral to Cardiac Rehabilitation  As directed     Diet - low sodium heart healthy  As directed     Increase activity slowly  As directed         Medication List    TAKE these medications       ALPRAZolam 1 MG tablet  Commonly known as:  XANAX  Take 1 tablet by mouth at bedtime     aspirin 81 MG chewable tablet  Chew 1 tablet (81 mg total) by mouth daily.     atorvastatin 40 MG tablet  Commonly known as:  LIPITOR  Take 1 tablet (40 mg total) by mouth daily at 6 PM.     Biotin 5000 MCG Tabs  Take 1 tablet by mouth once daily     cetirizine 10 MG tablet  Commonly known as:  ZYRTEC  Take 10 mg by mouth daily.     clopidogrel 75 MG tablet  Commonly known as:  PLAVIX  Take 1 tablet (75 mg total) by mouth daily with breakfast.     DULoxetine 60 MG capsule  Commonly known as:  CYMBALTA  Take 60 mg by mouth daily.     gabapentin 300 MG capsule  Commonly known as:  NEURONTIN  Take 300 mg by mouth daily as needed (for nerve pain).     HYDROcodone-acetaminophen 5-325 MG per tablet  Commonly known as:  NORCO/VICODIN  Take 1 tablet by mouth every 6 (six) hours as needed for pain.     insulin glargine 100 UNIT/ML injection  Commonly known as:  LANTUS   Inject 40-60 Units into the skin at bedtime. 150-200=40 units; >200=60 units     lidocaine 5 %  Commonly known as:  LIDODERM  Place 3 patches onto the skin daily as needed. Remove & Discard patch within 12 hours or as directed by MD     losartan-hydrochlorothiazide 100-25 MG per tablet  Commonly known as:  HYZAAR  Take 1 tablet by mouth daily.     metoprolol tartrate 25 MG tablet  Commonly known as:  LOPRESSOR  Take 1 tablet (25 mg total) by mouth 2 (two) times daily.     montelukast 10 MG tablet  Commonly known as:  SINGULAIR  Take 10 mg by mouth daily.     NEXIUM 40 MG capsule  Generic drug:  esomeprazole  Take 1 capsule by mouth daily.     NUVIGIL 250 MG tablet  Generic drug:  Armodafinil  Take 250 mg by mouth daily as needed (to stay awake).     PROBIOTIC PO  Take 1 capsule by mouth once daily     traZODone 50 MG tablet  Commonly known as:  DESYREL  Take 200 mg by mouth at bedtime.       Follow-up Information   Follow up with Nona Dell, MD. (Our office will call you for appt,)    Contact information:   [address] [place] [address] [phone]     Time spent with patient to include physician time:35 mintues  Joni Reining 12/10/2012, 9:47 AM  Cardiology Attending Patient interviewed and examined. Discussed with Joni Reining, NP.  Above note annotated and modified based upon my findings.  Right radial artery catheterization site is benign except for minor ecchymosis. Patient complains of localized left anterior chest discomfort with tenderness and a pleuritic component consistent with pain of musculoskeletal origin.  Return office visit with me planned. Agree with discharge today.  [place] Bing, MD 12/10/2012, 3:02 PM

## 2012-12-10 NOTE — Progress Notes (Signed)
CARDIAC REHAB PHASE I   PRE:  Rate/Rhythm: 59 sinus  BP:  Sitting: 126/59     SaO2: 96 RA  MODE:  Ambulation: 700 ft   POST:  Rate/Rhythem: 81 sinus  BP:  Sitting: 148/57   SaO2: 97 RA  Order received and appreciated.  Chart reviewed.  Pt ambulated 734ft with assist x1.  Pt took two standing rest breaks to catch breath from going too fast.  Pt did c/o chest discomfort similar to getting stent placed, MD aware and pt to call if it persistent or worsens.  Reviewed d/c education: diet, exercise, restrictions, stent, CP/calling 911/MD, and Cardiac Rehab Phase II.  Pt request to have info sent to [facility] for [facility] with [facility] closing in June.  Pt voiced understanding. Fabio Pierce, MA, ACSM RCEP [phone]  Hazle Nordmann

## 2012-12-13 ENCOUNTER — Telehealth: Payer: Self-pay | Admitting: Cardiology

## 2012-12-13 ENCOUNTER — Emergency Department (HOSPITAL_COMMUNITY)
Admission: EM | Admit: 2012-12-13 | Discharge: 2012-12-13 | Disposition: A | Discharge status: Home / Self Care | Payer: Medicare Other | Attending: Emergency Medicine | Admitting: Emergency Medicine

## 2012-12-13 ENCOUNTER — Emergency Department (HOSPITAL_COMMUNITY): Payer: Medicare Other

## 2012-12-13 ENCOUNTER — Encounter (HOSPITAL_COMMUNITY): Payer: Self-pay | Admitting: Emergency Medicine

## 2012-12-13 DIAGNOSIS — R110 Nausea: Secondary | ICD-10-CM | POA: Insufficient documentation

## 2012-12-13 DIAGNOSIS — M129 Arthropathy, unspecified: Secondary | ICD-10-CM | POA: Insufficient documentation

## 2012-12-13 DIAGNOSIS — I2510 Atherosclerotic heart disease of native coronary artery without angina pectoris: Secondary | ICD-10-CM | POA: Insufficient documentation

## 2012-12-13 DIAGNOSIS — D649 Anemia, unspecified: Secondary | ICD-10-CM | POA: Insufficient documentation

## 2012-12-13 DIAGNOSIS — R6883 Chills (without fever): Secondary | ICD-10-CM | POA: Insufficient documentation

## 2012-12-13 DIAGNOSIS — G8929 Other chronic pain: Secondary | ICD-10-CM | POA: Insufficient documentation

## 2012-12-13 DIAGNOSIS — R0789 Other chest pain: Secondary | ICD-10-CM | POA: Insufficient documentation

## 2012-12-13 DIAGNOSIS — E782 Mixed hyperlipidemia: Secondary | ICD-10-CM | POA: Insufficient documentation

## 2012-12-13 DIAGNOSIS — R0602 Shortness of breath: Secondary | ICD-10-CM | POA: Insufficient documentation

## 2012-12-13 DIAGNOSIS — J42 Unspecified chronic bronchitis: Secondary | ICD-10-CM | POA: Insufficient documentation

## 2012-12-13 DIAGNOSIS — Z8679 Personal history of other diseases of the circulatory system: Secondary | ICD-10-CM | POA: Insufficient documentation

## 2012-12-13 DIAGNOSIS — M545 Low back pain, unspecified: Secondary | ICD-10-CM | POA: Insufficient documentation

## 2012-12-13 DIAGNOSIS — Z8701 Personal history of pneumonia (recurrent): Secondary | ICD-10-CM | POA: Insufficient documentation

## 2012-12-13 DIAGNOSIS — Z888 Allergy status to other drugs, medicaments and biological substances status: Secondary | ICD-10-CM | POA: Insufficient documentation

## 2012-12-13 DIAGNOSIS — M62838 Other muscle spasm: Secondary | ICD-10-CM | POA: Insufficient documentation

## 2012-12-13 DIAGNOSIS — I10 Essential (primary) hypertension: Secondary | ICD-10-CM | POA: Insufficient documentation

## 2012-12-13 DIAGNOSIS — E119 Type 2 diabetes mellitus without complications: Secondary | ICD-10-CM | POA: Insufficient documentation

## 2012-12-13 DIAGNOSIS — R42 Dizziness and giddiness: Secondary | ICD-10-CM | POA: Insufficient documentation

## 2012-12-13 DIAGNOSIS — Z882 Allergy status to sulfonamides status: Secondary | ICD-10-CM | POA: Insufficient documentation

## 2012-12-13 DIAGNOSIS — J45909 Unspecified asthma, uncomplicated: Secondary | ICD-10-CM | POA: Insufficient documentation

## 2012-12-13 DIAGNOSIS — R011 Cardiac murmur, unspecified: Secondary | ICD-10-CM | POA: Insufficient documentation

## 2012-12-13 DIAGNOSIS — Z8719 Personal history of other diseases of the digestive system: Secondary | ICD-10-CM | POA: Insufficient documentation

## 2012-12-13 DIAGNOSIS — K219 Gastro-esophageal reflux disease without esophagitis: Secondary | ICD-10-CM | POA: Insufficient documentation

## 2012-12-13 DIAGNOSIS — Z79899 Other long term (current) drug therapy: Secondary | ICD-10-CM | POA: Insufficient documentation

## 2012-12-13 DIAGNOSIS — Z87891 Personal history of nicotine dependence: Secondary | ICD-10-CM | POA: Insufficient documentation

## 2012-12-13 DIAGNOSIS — R1013 Epigastric pain: Secondary | ICD-10-CM | POA: Insufficient documentation

## 2012-12-13 DIAGNOSIS — F411 Generalized anxiety disorder: Secondary | ICD-10-CM | POA: Insufficient documentation

## 2012-12-13 DIAGNOSIS — M62830 Muscle spasm of back: Secondary | ICD-10-CM

## 2012-12-13 DIAGNOSIS — M546 Pain in thoracic spine: Secondary | ICD-10-CM | POA: Insufficient documentation

## 2012-12-13 DIAGNOSIS — Z881 Allergy status to other antibiotic agents status: Secondary | ICD-10-CM | POA: Insufficient documentation

## 2012-12-13 DIAGNOSIS — IMO0001 Reserved for inherently not codable concepts without codable children: Secondary | ICD-10-CM | POA: Insufficient documentation

## 2012-12-13 DIAGNOSIS — R61 Generalized hyperhidrosis: Secondary | ICD-10-CM | POA: Insufficient documentation

## 2012-12-13 LAB — POCT I-STAT TROPONIN I

## 2012-12-13 LAB — GLUCOSE, CAPILLARY: Glucose-Capillary: 74 mg/dL (ref 70–99)

## 2012-12-13 LAB — BASIC METABOLIC PANEL
Calcium: 9.7 mg/dL (ref 8.4–10.5)
Creatinine, Ser: 0.62 mg/dL (ref 0.50–1.10)
GFR calc non Af Amer: >90 mL/min (ref >90)
Glucose, Bld: 103 mg/dL — ABNORMAL HIGH (ref 70–99)
Sodium: 138 mEq/L (ref 135–145)

## 2012-12-13 LAB — CBC
Hemoglobin: 14 g/dL (ref 12.0–15.0)
MCH: 32.6 pg (ref 26.0–34.0)
MCHC: 34.4 g/dL (ref 30.0–36.0)
MCV: 94.7 fL (ref 78.0–100.0)

## 2012-12-13 MED ORDER — FENTANYL CITRATE 0.05 MG/ML IJ SOLN
100.0000 ug | Freq: Once | INTRAMUSCULAR | Status: AC
  Administered 2012-12-13: 100 ug via INTRAVENOUS
  Filled 2012-12-13: qty 2

## 2012-12-13 MED ORDER — IOHEXOL 350 MG/ML SOLN
100.0000 mL | Freq: Once | INTRAVENOUS | Status: AC | PRN
  Administered 2012-12-13: 100 mL via INTRAVENOUS

## 2012-12-13 MED ORDER — SODIUM CHLORIDE 0.9 % IV BOLUS (SEPSIS)
1000.0000 mL | Freq: Once | INTRAVENOUS | Status: AC
  Administered 2012-12-13: 1000 mL via INTRAVENOUS

## 2012-12-13 MED ORDER — DIAZEPAM 5 MG PO TABS
5.0000 mg | ORAL_TABLET | Freq: Once | ORAL | Status: AC
  Administered 2012-12-13: 5 mg via ORAL
  Filled 2012-12-13: qty 1

## 2012-12-13 MED ORDER — CYCLOBENZAPRINE HCL 10 MG PO TABS: 10.0000 mg | ORAL_TABLET | Freq: Once | ORAL | Status: DC

## 2012-12-13 MED ORDER — LORAZEPAM 2 MG/ML IJ SOLN: 1.0000 mg | Freq: Once | INTRAMUSCULAR | Status: DC

## 2012-12-13 MED ORDER — DIAZEPAM 5 MG PO TABS: 5.0000 mg | ORAL_TABLET | Freq: Four times a day (QID) | ORAL | Status: DC | PRN

## 2012-12-13 MED ORDER — LORAZEPAM 2 MG/ML IJ SOLN
1.0000 mg | Freq: Once | INTRAMUSCULAR | Status: AC
  Administered 2012-12-13: 1 mg via INTRAVENOUS
  Filled 2012-12-13: qty 1

## 2012-12-13 MED ORDER — HYDROMORPHONE HCL PF 1 MG/ML IJ SOLN
1.0000 mg | Freq: Once | INTRAMUSCULAR | Status: AC
  Administered 2012-12-13: 1 mg via INTRAVENOUS
  Filled 2012-12-13: qty 1

## 2012-12-13 MED FILL — Sodium Chloride IV Soln 0.9%: INTRAVENOUS | Qty: 50 | Status: AC

## 2012-12-13 NOTE — ED Notes (Signed)
Patient given discharge instructions for muscle spasms. rx for valium sent home with patient. Advised to follow up with primary care. Patient voiced understanding of all instructions and had no further questions. Patient went to lobby via wheelchair.

## 2012-12-13 NOTE — ED Notes (Signed)
Pt given a cup of Sprite.

## 2012-12-13 NOTE — ED Notes (Signed)
Resident at bedside.  

## 2012-12-13 NOTE — Telephone Encounter (Signed)
PATIENT called back to tell us that her back pain has gotten a lot worse since she called this morning.  She is scared that it has something to do with the procedure she had done and would like someone to contact her asap

## 2012-12-13 NOTE — ED Provider Notes (Signed)
I saw and evaluated the patient, reviewed the resident's note and I agree with the findings including ECG interpretation and plan.  Yesterday this 62 year old female had positional mid back pain that was mild but have sudden sharp stabbing mid thoracic back pain worse with position changes without associated chest pain shortness breath lightheadedness abdominal pain or focal neurologic symptoms or change in bowel or bladder function with partially reproducible bilateral parathoracic soft tissue tenderness without midline back tenderness.  CT ordered to r/o dissection after DDx d/w Pt (shared decision-making)  Hurman Horn, MD 12/14/12 [phone]

## 2012-12-13 NOTE — Telephone Encounter (Signed)
Had procedure on 5-23 at [facility]. Now having back pain for 2 days .

## 2012-12-13 NOTE — ED Notes (Signed)
New CP today. Stent placed on Friday. At home patient started experiencing mid back pain. This am increasing pain back/chest when trying to stand. Advised by Cards office to come to ED. No diaphoresis, SOB

## 2012-12-13 NOTE — ED Provider Notes (Signed)
History    CSN: [id number] Arrival date & time 12/13/12  1323  First MD Initiated Contact with Patient 12/13/12 1444     Chief Complaint  Patient presents with  . Chest Pain    HPI Comments: Patient is a 62 year old female who underwent PCI 4 days ago for unstable angina.  She was discharged to home 2 days postop and had been doing well until this morning.  This morning she he woke up with significant mid thoracic back pain.  Worse with any type of movement.  She reports it as radiating into her chest.  She reports some dizziness and lightheadedness and shortness of breath associated with this.  While lying still she is asymptomatic.  She reports that on the way to the emergency department in her car she was very uncomfortable and had significant difficulties whenever he would hit a bump.   She denies being afraid to move because she felt like she is going to worsen.  She has had chronic low back pain but this is significantly different.  She has a difficult time describing the character of the pain.  She has not tried anything for relief.  Patient is a 62 y.o. female presenting with chest pain.  Chest Pain Pain location:  Unable to specify (more focally in mid thoracic back pain) Pain quality: sharp and tightness   Pain radiates to:  Mid back Pain severity:  Severe Onset quality:  Gradual Associated symptoms: abdominal pain (epigastric with deep breath), back pain, diaphoresis, nausea and shortness of breath   Associated symptoms: no cough, no dizziness, no fever, no headache, no numbness, no palpitations, not vomiting and no weakness     Past Medical History  Diagnosis Date  . Essential hypertension, benign   . Mixed hyperlipidemia   . GERD (gastroesophageal reflux disease)   . Anxiety   . Type 2 diabetes mellitus   . IBS (irritable bowel syndrome)   . Asthma   . Fibromyalgia   . Complication of anesthesia     "took long time to get me awake once in 2008" (12/09/2012)  .  Pancreatitis 2008  . Barrett esophagus   . Coronary artery disease   . Heart murmur     "when I was young & noted when I had menstrual periods" (12/09/2012)  . Anginal pain   . Liver cyst     "told I did; they couldn't find it when rechecked" (12/09/2012)  . Pneumonia 1990's    "2-3 times; before I quit smoking in 1995" (12/09/2012)  . Chronic bronchitis     "when I was smoking and now when I'm around smoking' (12/09/2012)  . Exertional shortness of breath     "sometimes recently when I'm just setting" (12/09/2012)  . OSA on CPAP     "wear mask when sinuses aren't hurting me" (12/09/2012)  . Anemia     "when I was young" (12/09/2012)  . H/O hiatal hernia   . Sinus headache     "alot" (12/09/2012)  . Migraines     "off and on" (12/09/2012)  . Arthritis     "hips, shoulder, elbows, feet" (12/09/2012)  . Chronic lower back pain     "I have 3 bulging discs" (12/09/2012)    Past Surgical History  Procedure Laterality Date  . Toe fusion Right 10/11    First MTP joint  . Colonoscopy  10/13    Dr. Teena Dunk - tubular adenomas and diverticulosis  . Cholecystectomy  1990'S  . Appendectomy  ?  1987  . Abdominal hysterectomy  ?1987  . Carpal tunnel release Bilateral ?1990  . Cataract extraction w/ intraocular lens  implant, bilateral Bilateral ?2005  . Cardiac catheterization  ?2003  . Coronary angioplasty with stent placement  12/09/2012    "1" (12/09/2012)  . Humerus fracture surgery Right 07/19/1969    "horst threw me" (12/09/2012)  . Bunionectomy Bilateral 1972  . Toe surgery Right 1990's    "toe next to big toe:  dr said I had tumor; cut bone & stuff; another OR dr stretched tendons, etc" (12/09/2012)  . Nasal septum surgery  1970's  . Dilation and curettage of uterus  1970's    "probably 3" (12/09/2012)  . Tubal ligation  1985  . Cesarean section  1985  . Ercp  2008  . Esophageal dilation  ` 2012    Family History  Problem Relation Age of Onset  . Stroke Mother   . Cirrhosis Father      History  Substance Use Topics  . Smoking status: Former Smoker -- 0.80 packs/day for 15 years    Types: Cigarettes    Quit date: 07/20/1993  . Smokeless tobacco: Never Used  . Alcohol Use: No    OB History   Grav Para Term Preterm Abortions TAB SAB Ect Mult Living                  Review of Systems  Constitutional: Positive for chills and diaphoresis. Negative for fever and activity change.  HENT: Negative for neck pain and neck stiffness.   Eyes: Negative.   Respiratory: Positive for chest tightness (pain with taking a deep breath.) and shortness of breath. Negative for cough, choking, wheezing and stridor.   Cardiovascular: Positive for chest pain. Negative for palpitations and leg swelling.  Gastrointestinal: Positive for nausea and abdominal pain (epigastric with deep breath). Negative for vomiting, blood in stool, abdominal distention and anal bleeding.  Genitourinary: Negative for dysuria, frequency, hematuria and enuresis.  Musculoskeletal: Positive for back pain.  Skin: Negative.   Allergic/Immunologic: Negative.   Neurological: Positive for light-headedness. Negative for dizziness, syncope, weakness, numbness and headaches.  Hematological: Negative.   Psychiatric/Behavioral: The patient is nervous/anxious.     Allergies  Erythromycin base; Remeron; Zolpidem tartrate; and Sulfonamide derivatives  Home Medications   Current Outpatient Rx  Name  Route  Sig  Dispense  Refill  . albuterol (PROVENTIL HFA;VENTOLIN HFA) 108 (90 BASE) MCG/ACT inhaler   Inhalation   Inhale 2 puffs into the lungs every 6 (six) hours as needed for wheezing.         Marland Kitchen ALPRAZolam (XANAX) 1 MG tablet      Take 1 tablet by mouth at bedtime          . Armodafinil (NUVIGIL) 250 MG tablet   Oral   Take 250 mg by mouth daily as needed (to stay awake).         Marland Kitchen aspirin EC 81 MG tablet   Oral   Take 81 mg by mouth daily.         Marland Kitchen atorvastatin (LIPITOR) 40 MG tablet   Oral    Take 1 tablet (40 mg total) by mouth daily at 6 PM.   30 tablet   10   . Biotin 5000 MCG TABS      Take 1 tablet by mouth once daily          . cetirizine (ZYRTEC) 10 MG tablet   Oral   Take 10 mg  by mouth daily.           . clopidogrel (PLAVIX) 75 MG tablet   Oral   Take 1 tablet (75 mg total) by mouth daily with breakfast.   30 tablet   10   . DULoxetine (CYMBALTA) 60 MG capsule   Oral   Take 60 mg by mouth daily.           Marland Kitchen gabapentin (NEURONTIN) 300 MG capsule   Oral   Take 300 mg by mouth daily as needed (for nerve pain).         Marland Kitchen HYDROcodone-acetaminophen (NORCO/VICODIN) 5-325 MG per tablet   Oral   Take 1 tablet by mouth every 6 (six) hours as needed for pain.         Marland Kitchen insulin glargine (LANTUS) 100 UNIT/ML injection   Subcutaneous   Inject 40-60 Units into the skin at bedtime. 150-200=40 units; >200=60 units         . lidocaine (LIDODERM) 5 %   Transdermal   Place 3 patches onto the skin daily as needed. Remove & Discard patch within 12 hours or as directed by MD         . losartan-hydrochlorothiazide (HYZAAR) 100-25 MG per tablet   Oral   Take 1 tablet by mouth daily.           . metoprolol tartrate (LOPRESSOR) 25 MG tablet   Oral   Take 1 tablet (25 mg total) by mouth 2 (two) times daily.   60 tablet   10   . montelukast (SINGULAIR) 10 MG tablet   Oral   Take 10 mg by mouth daily.           Marland Kitchen NEXIUM 40 MG capsule   Oral   Take 1 capsule by mouth daily.         . nitroGLYCERIN (NITROSTAT) 0.4 MG SL tablet   Sublingual   Place 1 tablet (0.4 mg total) under the tongue every 5 (five) minutes as needed for chest pain.   30 tablet   12   . Probiotic Product (PROBIOTIC PO)      Take 1 capsule by mouth once daily          . traZODone (DESYREL) 50 MG tablet   Oral   Take 200 mg by mouth at bedtime.          . Vitamin D, Ergocalciferol, (DRISDOL) 50000 UNITS CAPS   Oral   Take 150,000 Units by mouth daily.         .  diazepam (VALIUM) 5 MG tablet   Oral   Take 1 tablet (5 mg total) by mouth every 6 (six) hours as needed (muscle spasm).   30 tablet   0     BP 129/57  Pulse 61  Temp(Src) 98 F (36.7 C) (Oral)  Resp 14  SpO2 94%  Physical Exam  Nursing note and vitals reviewed. Constitutional: She appears well-developed and well-nourished. No distress.  HENT:  Head: Normocephalic and atraumatic.  Eyes: Conjunctivae are normal. Right eye exhibits no discharge. Left eye exhibits no discharge. No scleral icterus.  Neck: No JVD present. No tracheal deviation present.  Cardiovascular: Normal rate, regular rhythm, normal heart sounds and intact distal pulses.  Exam reveals no gallop and no friction rub.   No murmur heard. Pulmonary/Chest: Effort normal and breath sounds normal. No respiratory distress. She has no wheezes. She has no rales.  Abdominal: Soft.  Musculoskeletal: Normal range of motion. She exhibits  no edema.  Palpable muscle spasm and mid thoracic region.  Pain with thoracic flexion and rotation.  Neurological: She is alert. She exhibits normal muscle tone.  Skin: Skin is warm and dry. No rash noted. She is not diaphoretic. No erythema. No pallor.  Psychiatric: She has a normal mood and affect. Her behavior is normal. Judgment and thought content normal.    ED Course  Procedures (including critical care time)  Labs Reviewed  BASIC METABOLIC PANEL - Abnormal; Notable for the following:    Glucose, Bld 103 (*)    All other components within normal limits  CBC  GLUCOSE, CAPILLARY  POCT I-STAT TROPONIN I   Dg Chest 2 View  12/13/2012   *RADIOLOGY REPORT*  Clinical Data: Chest pain, shortness of breath, headache.  CHEST - 2 VIEW  Comparison: 12/05/2012  Findings: Heart and mediastinal contours are within normal limits. No focal opacities or effusions.  No acute bony abnormality.  IMPRESSION: No active cardiopulmonary disease.   Original Report Authenticated By: Charlett Nose, M.D.   Ct  Angio Chest Aortic Dissect W &/or W/o  12/13/2012   *RADIOLOGY REPORT*  Clinical Data:  Chest and back pain, recent coronary stent insertion 5 days ago, shortness of breath  CT ANGIOGRAPHY CHEST, ABDOMEN AND PELVIS  Technique:  Multidetector CT imaging through the chest, abdomen and pelvis was performed using the standard protocol during bolus administration of intravenous contrast.  Multiplanar reconstructed images including MIPs were obtained and reviewed to evaluate the vascular anatomy.  Contrast: OMNIPAQUE IOHEXOL 350 MG/ML SOLN  Comparison:  12/13/2012 chest x-ray and 08/27/2006 CT without contrast  CTA CHEST  Findings:  Atherosclerotic changes of the thoracic aorta with intimal thickening and calcification noted.  Negative for acute dissection, aneurysm, mediastinal hemorrhage, or intramural hemorrhage.  Major branch vessels remain patent.  Visualized central pulmonary arteries are also patent.  No significant pulmonary embolus.  Within the anterior mediastinum, there is a prominent lymph node measuring 15 x 8 mm, image 17.  No significant adenopathy appreciated.  Coronary stents evident.  No pericardial or pleural effusion.  Lung windows demonstrate respiratory motion artifact.  Mild apical scarring bilaterally.  Scattered patchy ground-glass attenuation in the lingula, inferior right middle lobe, and both lower lobes, suspect related to poor inspiration.  No definite pneumonia, collapse, consolidation, or edema.  Trachea central airways appear patent.  No suspicious pulmonary nodule or mass.   Review of the MIP images confirms the above findings.  IMPRESSION: Atherosclerotic thoracic aorta without acute dissection  Negative for significant acute pulmonary embolus  No acute intrathoracic finding  Prominent anterior mediastinal lymph node, nonspecific  CTA ABDOMEN AND PELVIS  Findings:  Atherosclerotic changes of the abdominal aorta without aneurysm, dissection or occlusion.  No retroperitoneal  hemorrhage or hematoma.  The celiac, SMA, renal arteries, and IMA are all patent.  Common, internal and external iliac arteries are patent with mild atherosclerotic changes.  Visualized common femoral, proximal profunda femoral, and superficial femoral arteries are also patent.  No acute vascular abnormality appreciated.  Circumaortic left renal vein noted, normal variant.  Nonvascular findings: Prior cholecystectomy evident.  Slight biliary dilatation, suspect related to post cholecystectomy effect. Incidental duodenal diverticulum noted close to the CBD insertion.  Liver, spleen, pancreas, and kidneys are within normal limits for arterial phase imaging.  Mild thickening of the adrenal glands, stable.  Negative for bowel obstruction, dilatation, ileus or free air. Sigmoid diverticulosis noted.  Retained stool throughout the colon. No free air.  No abdominal  or pelvic free fluid, fluid collection, hemorrhage, or abscess.  Prior hysterectomy evident.  Urinary bladder unremarkable.  No inguinal abnormality, or hernia.  Diffuse degenerative changes of the spine, and SI joints.   Review of the MIP images confirms the above findings.  IMPRESSION: Negative for acute abdominal aortic dissection.   atherosclerotic changes without occlusive process.  Mesenteric, renal and iliac vasculature also patent.  Incidental duodenal diverticulum  Prior cholecystectomy with stable CBD dilatation.  Sigmoid diverticulosis  Prior hysterectomy   Original Report Authenticated By: Judie Petit. Miles Costain, M.D.   Ct Angio Abd/pel W/ And/or W/o  12/13/2012   *RADIOLOGY REPORT*  Clinical Data:  Chest and back pain, recent coronary stent insertion 5 days ago, shortness of breath  CT ANGIOGRAPHY CHEST, ABDOMEN AND PELVIS  Technique:  Multidetector CT imaging through the chest, abdomen and pelvis was performed using the standard protocol during bolus administration of intravenous contrast.  Multiplanar reconstructed images including MIPs were obtained and  reviewed to evaluate the vascular anatomy.  Contrast: OMNIPAQUE IOHEXOL 350 MG/ML SOLN  Comparison:  12/13/2012 chest x-ray and 08/27/2006 CT without contrast  CTA CHEST  Findings:  Atherosclerotic changes of the thoracic aorta with intimal thickening and calcification noted.  Negative for acute dissection, aneurysm, mediastinal hemorrhage, or intramural hemorrhage.  Major branch vessels remain patent.  Visualized central pulmonary arteries are also patent.  No significant pulmonary embolus.  Within the anterior mediastinum, there is a prominent lymph node measuring 15 x 8 mm, image 17.  No significant adenopathy appreciated.  Coronary stents evident.  No pericardial or pleural effusion.  Lung windows demonstrate respiratory motion artifact.  Mild apical scarring bilaterally.  Scattered patchy ground-glass attenuation in the lingula, inferior right middle lobe, and both lower lobes, suspect related to poor inspiration.  No definite pneumonia, collapse, consolidation, or edema.  Trachea central airways appear patent.  No suspicious pulmonary nodule or mass.   Review of the MIP images confirms the above findings.  IMPRESSION: Atherosclerotic thoracic aorta without acute dissection  Negative for significant acute pulmonary embolus  No acute intrathoracic finding  Prominent anterior mediastinal lymph node, nonspecific  CTA ABDOMEN AND PELVIS  Findings:  Atherosclerotic changes of the abdominal aorta without aneurysm, dissection or occlusion.  No retroperitoneal hemorrhage or hematoma.  The celiac, SMA, renal arteries, and IMA are all patent.  Common, internal and external iliac arteries are patent with mild atherosclerotic changes.  Visualized common femoral, proximal profunda femoral, and superficial femoral arteries are also patent.  No acute vascular abnormality appreciated.  Circumaortic left renal vein noted, normal variant.  Nonvascular findings: Prior cholecystectomy evident.  Slight biliary dilatation,  suspect related to post cholecystectomy effect. Incidental duodenal diverticulum noted close to the CBD insertion.  Liver, spleen, pancreas, and kidneys are within normal limits for arterial phase imaging.  Mild thickening of the adrenal glands, stable.  Negative for bowel obstruction, dilatation, ileus or free air. Sigmoid diverticulosis noted.  Retained stool throughout the colon. No free air.  No abdominal or pelvic free fluid, fluid collection, hemorrhage, or abscess.  Prior hysterectomy evident.  Urinary bladder unremarkable.  No inguinal abnormality, or hernia.  Diffuse degenerative changes of the spine, and SI joints.   Review of the MIP images confirms the above findings.  IMPRESSION: Negative for acute abdominal aortic dissection.   atherosclerotic changes without occlusive process.  Mesenteric, renal and iliac vasculature also patent.  Incidental duodenal diverticulum  Prior cholecystectomy with stable CBD dilatation.  Sigmoid diverticulosis  Prior hysterectomy  Original Report Authenticated By: Judie Petit. Shick, M.D.     1. Muscle spasm of back      MDM  Patient with midthoracic back pain.  She demonstrates muscle spasm on exam.  Patient initially reports this was a sudden onset of midthoracic back pain however upon further questioning she does report that she's had some discomfort in this area prior to this morning and has progressively worsened.  However given the presentation with persistent mid back pain since PCI we'll evaluate with a CT of the chest for aortic dissection although recognizes is likely low yield.  Renal function is preserved following PCI.  Vital signs are otherwise stable.  Given  significant discomfort and positive smoking history with early cardiac death in the family will proceed with the above test.  CT negative for dissection.  Troponin had been collected greater than 6 hours after onset of pain and pain has improved with symptomatic treatment in the emergency department.   Will plan to discharge home with muscle relaxer to follow up with PCP.  Keep cardiology follow up as previously scheduled  Andrena Mews, DO 12/13/12 2140

## 2012-12-14 NOTE — Telephone Encounter (Signed)
Spoke with patient and she said she went to ED on yesterday and was diagnosed with a pulled muscle in her back.

## 2012-12-19 ENCOUNTER — Telehealth: Payer: Self-pay | Admitting: *Deleted

## 2012-12-19 NOTE — Telephone Encounter (Signed)
I spoke with the pt and she went to a 62 YEAR old birthday party on Saturday.  Yesterday she did not feel great but today she has developed a temperature of 100.3, BP 170/90 and pulse 90. The pt has a headache, throat congestion and aches all over.  The pt did walk around her yard today and notices she did get a little short winded.The pt does state that she is anxious since her hospitalization.   I made the pt aware that she does not need to take NTG for these symptoms.  I did instruct the pt that she can take Tylenol as needed. I advised the pt that she needs to remain well hydrated and if she is still running a fever tomorrow she should contact her PCP. The pt will continue to monitor her BP and will contact our office if if remains elevated. Pt agreed with plan.

## 2012-12-19 NOTE — Telephone Encounter (Signed)
PT WAS SEEN AT [facility] BUT IS SWITCHING TO [facility] OFFICE. SHE HAS APPT 12/27/12 W/ [facility] POST CATH.  SHE STATES SHE DOESN'T FEEL GOOD TODAY, SHE HAS BEEN SHORT OF BREATH AND HAS A FEVER OF 100.3. SHE WAS WONDERING IF SHE NEEDS TO TAKE A NITRO

## 2012-12-27 ENCOUNTER — Encounter: Payer: Self-pay | Admitting: Adult Health

## 2012-12-27 ENCOUNTER — Ambulatory Visit (INDEPENDENT_AMBULATORY_CARE_PROVIDER_SITE_OTHER): Payer: Medicare Other | Admitting: Adult Health

## 2012-12-27 VITALS — BP 144/66 | HR 60 | Ht 63.0 in | Wt 146.0 lb

## 2012-12-27 DIAGNOSIS — I2510 Atherosclerotic heart disease of native coronary artery without angina pectoris: Secondary | ICD-10-CM

## 2012-12-27 DIAGNOSIS — E119 Type 2 diabetes mellitus without complications: Secondary | ICD-10-CM

## 2012-12-27 DIAGNOSIS — I7090 Unspecified atherosclerosis: Secondary | ICD-10-CM

## 2012-12-27 DIAGNOSIS — I10 Essential (primary) hypertension: Secondary | ICD-10-CM

## 2012-12-27 MED ORDER — AMLODIPINE BESYLATE 2.5 MG PO TABS: 2.5000 mg | ORAL_TABLET | Freq: Every day | ORAL | Status: DC

## 2012-12-27 MED ORDER — METFORMIN HCL ER (MOD) 500 MG PO TB24: 500.0000 mg | ORAL_TABLET | Freq: Every day | ORAL | Status: DC
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
## 2012-12-27 NOTE — Patient Instructions (Addendum)
Your physician recommends that you schedule a follow-up appointment in: 3 MONTHS  Your physician has recommended you make the following change in your medication: Amlodipine 2.5 mg daily  Nurse visit to recheck blood pressure in 1 WEEKAngiogram, Angioplasty, or Stent Placement Care After One of the following procedures was done today. ANGIOGRAM: A catheter was placed through the blood vessel in your groin, contrast was injected into the vessels, and pictures were taken. ANGIOPLASTY: A catheter was placed through the blood vessel in your groin and directed to an area of blocked blood flow. A balloon, and possibly a metal stent were used to open the blockage. If no other blockages are present below this area, your symptoms should improve. If blockages are present below this area, surgery may still be necessary. STENT: A catheter was placed in your groin through which a metal mesh tube was placed in a narrowed part of the artery to facilitate blood flow. You were given intravenous sedation. These medications are rapidly cleared from your bloodstream. You may feel some discomfort at the insertion site after the local anesthetic wears off. This discomfort should gradually improve over the next several days.  Only take over-the-counter or prescription medicines for pain, discomfort, or fever as directed by your caregiver.  Complications are very uncommon after this procedure. Go to the nearest emergency department if you develop any of the following symptoms:  Worsening pain.  Bleeding.  Swelling at the puncture site.  Lightheadedness.  Dizziness or fainting.  Fever or chills.  If oozing, bleeding, or a lump appears at the puncture site, apply firm pressure directly to the site steadily for 15 minutes and go to the emergency department.  Keep the skin around the insertion site dry. You may take showers after 24 hours. If the area does get wet, dry the skin completely. Avoid baths until the  skin puncture site heals, usually 5 to 7 days.  Development of redness, increased soreness, or swelling may be signs of a skin infection. Contact your physician.  Rest for the remainder of the day and avoid any heavy lifting (more than 10 pounds or 4.5 kg). Do not operate heavy machinery, drive, or make legal decisions for the first 24 hours after the procedure. Have a responsible person drive you home.  You may resume your usual diet after the procedure. Avoid alcoholic beverages for 24 hours after the procedure. Document Released: 07/06/2005 Document Revised: 09/28/2011 Document Reviewed: 05/05/2006 [facility] Patient Information 2014 [place], [place].

## 2012-12-27 NOTE — Assessment & Plan Note (Signed)
She is currently on insulin at [address] [facility] and a 64 year old bottle of metformin 500 mg daily. She states that her BG remains elevated. She is on BB, which can contribute to higher BG readings. I have asked her to see her PCP for ongoing management.  I will give her a Rx for new bottle of metformin but will not continue to treat diabetes. She is advised to see her PCP for dosage adjustments.

## 2012-12-27 NOTE — Assessment & Plan Note (Signed)
Blood pressure is not well controlled on current regimen. I have rechecked her BP in the clinic, manually. 178/78. I will add amlodipine 2.5mg  daily for better control. She is on metoprolol 25 mg BID with HR of 60 bpm, and losartan 100/25 mg daily as well. She will have a BP check in one week.

## 2012-12-27 NOTE — Assessment & Plan Note (Signed)
She is doing well without recurrent complaints of angina. She is to start cardiac rehab in the next couple of weeks. She is trying to slowly increase her exercise without heavy lifting. We will see her again in 3 months. Continue DAPT, BB, and Statin.

## 2012-12-28 ENCOUNTER — Ambulatory Visit: Payer: PRIVATE HEALTH INSURANCE | Admitting: Cardiology

## 2013-01-03 ENCOUNTER — Encounter: Payer: Self-pay | Admitting: *Deleted

## 2013-01-03 ENCOUNTER — Telehealth: Payer: Self-pay | Admitting: *Deleted

## 2013-01-03 ENCOUNTER — Ambulatory Visit (INDEPENDENT_AMBULATORY_CARE_PROVIDER_SITE_OTHER): Payer: Medicare Other | Admitting: *Deleted

## 2013-01-03 VITALS — BP 126/67 | HR 78 | Ht 63.0 in | Wt 149.8 lb

## 2013-01-03 DIAGNOSIS — I10 Essential (primary) hypertension: Secondary | ICD-10-CM

## 2013-01-03 NOTE — Patient Instructions (Addendum)
Your physician recommends that you schedule a follow-up appointment in: TO BE DETERMINED   

## 2013-01-03 NOTE — Progress Notes (Signed)
Pt presented to [facility] today to re-check bp No complaints voiced Please advise if any further instructions need to be advised  Cala Bradford A. Roselynn Whitacre L.P.N.  Last OV D/C Summary and VS:   BP 144/66  Pulse 60  Ht 5\' 3"  (1.6 m)  Wt 146 lb (66.225 kg)  BMI 25.87 kg/m2  Your physician recommends that you schedule a follow-up appointment in: 3 MONTHS  Your physician has recommended you make the following change in your medication: Amlodipine 2.5 mg daily  Nurse visit to recheck blood pressure in 1 WEEK

## 2013-01-03 NOTE — Telephone Encounter (Signed)
Pt wanted to clarify how long she may see bruising periodically due to recent medication addition of Plavix, please advise

## 2013-01-05 ENCOUNTER — Encounter (HOSPITAL_COMMUNITY)
Admission: RE | Admit: 2013-01-05 | Discharge: 2013-01-05 | Disposition: A | Discharge status: Home / Self Care | Payer: Medicare Other | Source: Ambulatory Visit | Attending: Cardiology | Admitting: Cardiology

## 2013-01-05 VITALS — BP 130/62 | HR 54 | Ht 63.0 in | Wt 146.7 lb

## 2013-01-05 DIAGNOSIS — Z955 Presence of coronary angioplasty implant and graft: Secondary | ICD-10-CM

## 2013-01-05 DIAGNOSIS — Z9861 Coronary angioplasty status: Secondary | ICD-10-CM | POA: Insufficient documentation

## 2013-01-05 DIAGNOSIS — Z5189 Encounter for other specified aftercare: Secondary | ICD-10-CM | POA: Insufficient documentation

## 2013-01-05 NOTE — Progress Notes (Signed)
Patient was referred to CR by Dr. Diona Browner due to patient recent Coronary Stent placement V45.82. During orientation advised patient on arrival and appointment times what to wear, what to do before, during and after exercise. Reviewed attendance and class policy. Talked about inclement weather and class consultation policy. Pt is scheduled to start Cardiac Rehab on 01/09/13 at 9:30. Pt was advised to come to class 5 minutes before class starts. He was also given instructions on meeting with the dietician and attending the Family Structure classes. Pt is eager to get started.

## 2013-01-05 NOTE — Patient Instructions (Signed)
Pt has finished orientation and is scheduled to start CR on 01/09/13 at 9:30. t has been instructed to arrive to class 15 minutes early for scheduled class. Pt has been instructed to wear comfortable clothing and shoes with rubber soles. Pt has been told to take their medications 1 hour prior to coming to class.  If the patient is not going to attend class, he/she has been instructed to call.

## 2013-01-09 ENCOUNTER — Encounter (HOSPITAL_COMMUNITY)
Admission: RE | Admit: 2013-01-09 | Discharge: 2013-01-09 | Disposition: A | Discharge status: Home / Self Care | Payer: Medicare Other | Source: Ambulatory Visit | Attending: Cardiology | Admitting: Cardiology

## 2013-01-09 NOTE — Telephone Encounter (Signed)
Spoke to pt to advise results/instructions. Pt understood.  

## 2013-01-09 NOTE — Telephone Encounter (Signed)
Usually this is a long term thing, but decreased bruising should decline after about a month. For excessive bruising will need to be seen to assess her for changes in medication

## 2013-01-09 NOTE — Progress Notes (Signed)
Spoke to pt to advise results/instructions. Pt understood.  

## 2013-01-09 NOTE — Progress Notes (Signed)
BP good. Continue meds as directed.

## 2013-01-11 ENCOUNTER — Encounter (HOSPITAL_COMMUNITY)
Admission: RE | Admit: 2013-01-11 | Discharge: 2013-01-11 | Disposition: A | Discharge status: Home / Self Care | Payer: Medicare Other | Source: Ambulatory Visit | Attending: Cardiology | Admitting: Cardiology

## 2013-01-12 ENCOUNTER — Telehealth: Payer: Self-pay

## 2013-01-12 NOTE — Telephone Encounter (Signed)
Pt noted temp of 102.7 this am, thought due to rehab yesterday, pt noted she had a tick on her, removed it, took tylenol and feels much better, advised pt to call PCP, pt advised she gets bit by ticks all the time and they usually place her on ABT, reiterated the importance to be evaluated, pt understood and will call PCP or seek medical attention in ED if sxs worsen

## 2013-01-12 NOTE — Telephone Encounter (Signed)
Patient called and states she is feeling achy and running a fever, has chills, was not sure if it is related to Cardiac Rehab or heart.  Patient has not called PCP.    Patient has taken Tylenol.

## 2013-01-13 ENCOUNTER — Encounter (HOSPITAL_COMMUNITY): Payer: Medicare Other

## 2013-01-16 ENCOUNTER — Encounter (HOSPITAL_COMMUNITY)
Admission: RE | Admit: 2013-01-16 | Discharge: 2013-01-16 | Disposition: A | Discharge status: Home / Self Care | Payer: Medicare Other | Source: Ambulatory Visit | Attending: Cardiology | Admitting: Cardiology

## 2013-01-18 ENCOUNTER — Encounter (HOSPITAL_COMMUNITY)
Admission: RE | Admit: 2013-01-18 | Discharge: 2013-01-18 | Disposition: A | Discharge status: Home / Self Care | Payer: Medicare Other | Source: Ambulatory Visit | Attending: Cardiology | Admitting: Cardiology

## 2013-01-18 DIAGNOSIS — Z9861 Coronary angioplasty status: Secondary | ICD-10-CM | POA: Insufficient documentation

## 2013-01-18 DIAGNOSIS — Z5189 Encounter for other specified aftercare: Secondary | ICD-10-CM | POA: Insufficient documentation

## 2013-01-20 ENCOUNTER — Encounter (HOSPITAL_COMMUNITY): Payer: Medicare Other

## 2013-01-23 ENCOUNTER — Encounter (HOSPITAL_COMMUNITY)
Admission: RE | Admit: 2013-01-23 | Discharge: 2013-01-23 | Disposition: A | Discharge status: Home / Self Care | Payer: Medicare Other | Source: Ambulatory Visit | Attending: Cardiology | Admitting: Cardiology

## 2013-01-25 ENCOUNTER — Encounter (HOSPITAL_COMMUNITY): Payer: Medicare Other

## 2013-01-27 ENCOUNTER — Encounter (HOSPITAL_COMMUNITY)
Admission: RE | Admit: 2013-01-27 | Discharge: 2013-01-27 | Disposition: A | Discharge status: Home / Self Care | Payer: Medicare Other | Source: Ambulatory Visit | Attending: Cardiology | Admitting: Cardiology

## 2013-01-30 ENCOUNTER — Encounter (HOSPITAL_COMMUNITY)
Admission: RE | Admit: 2013-01-30 | Discharge: 2013-01-30 | Disposition: A | Discharge status: Home / Self Care | Payer: Medicare Other | Source: Ambulatory Visit | Attending: Cardiology | Admitting: Cardiology

## 2013-02-01 ENCOUNTER — Encounter (HOSPITAL_COMMUNITY)
Admission: RE | Admit: 2013-02-01 | Discharge: 2013-02-01 | Disposition: A | Discharge status: Home / Self Care | Payer: Medicare Other | Source: Ambulatory Visit | Attending: Cardiology | Admitting: Cardiology

## 2013-02-03 ENCOUNTER — Encounter (HOSPITAL_COMMUNITY): Payer: Medicare Other

## 2013-02-06 ENCOUNTER — Encounter (HOSPITAL_COMMUNITY): Payer: Medicare Other

## 2013-02-08 ENCOUNTER — Encounter (HOSPITAL_COMMUNITY): Payer: Medicare Other

## 2013-02-10 ENCOUNTER — Encounter (HOSPITAL_COMMUNITY): Payer: Medicare Other

## 2013-02-13 ENCOUNTER — Encounter (HOSPITAL_COMMUNITY)
Admission: RE | Admit: 2013-02-13 | Discharge: 2013-02-13 | Disposition: A | Discharge status: Home / Self Care | Payer: Medicare Other | Source: Ambulatory Visit | Attending: Cardiology | Admitting: Cardiology

## 2013-02-15 ENCOUNTER — Encounter (HOSPITAL_COMMUNITY): Payer: Medicare Other

## 2013-02-17 ENCOUNTER — Encounter (HOSPITAL_COMMUNITY): Payer: Medicare Other

## 2013-02-20 ENCOUNTER — Encounter (HOSPITAL_COMMUNITY): Payer: Medicare Other

## 2013-02-22 ENCOUNTER — Encounter (HOSPITAL_COMMUNITY): Payer: Medicare Other

## 2013-02-23 ENCOUNTER — Telehealth: Payer: Self-pay | Admitting: Adult Health

## 2013-02-23 NOTE — Telephone Encounter (Signed)
She does not need to be treated with abx for routine dental procedure. For extraction or root canal, she will need to have abx.Amoxicillin 500 mg on the day before and the day of procedure.

## 2013-02-23 NOTE — Telephone Encounter (Signed)
Patient states that she needs to know if she needs to be pre medicated prior to dental procedure.  If so, please call Dr.Knox's office at [phone]. / tgs

## 2013-02-24 ENCOUNTER — Encounter (HOSPITAL_COMMUNITY): Payer: Medicare Other

## 2013-02-24 NOTE — Telephone Encounter (Signed)
FYI: Spoke to rep from Dr Lucky Cowboy office was informed that pt advised dental office the following "I will die if I stop taking any of my medicines" Therefore dental office rep is faxing a clearance letter to this office. Will have it completed by MD/NP upon arrival.

## 2013-02-27 ENCOUNTER — Encounter (HOSPITAL_COMMUNITY): Payer: Medicare Other

## 2013-03-01 ENCOUNTER — Encounter (HOSPITAL_COMMUNITY): Payer: Medicare Other

## 2013-03-01 NOTE — Telephone Encounter (Signed)
Fax received in office addressed to Dr. Clifton James. Will fax to  office--[phone].

## 2013-03-03 ENCOUNTER — Encounter (HOSPITAL_COMMUNITY): Payer: Medicare Other

## 2013-03-06 ENCOUNTER — Encounter (HOSPITAL_COMMUNITY): Payer: Medicare Other

## 2013-03-08 ENCOUNTER — Encounter (HOSPITAL_COMMUNITY): Payer: Medicare Other

## 2013-03-10 ENCOUNTER — Encounter (HOSPITAL_COMMUNITY): Payer: Medicare Other

## 2013-03-13 ENCOUNTER — Encounter (HOSPITAL_COMMUNITY): Payer: Medicare Other

## 2013-03-15 ENCOUNTER — Encounter (HOSPITAL_COMMUNITY): Payer: Medicare Other

## 2013-03-15 ENCOUNTER — Encounter (HOSPITAL_COMMUNITY): Payer: Self-pay | Admitting: *Deleted

## 2013-03-15 ENCOUNTER — Emergency Department (HOSPITAL_COMMUNITY)
Admission: EM | Admit: 2013-03-15 | Discharge: 2013-03-15 | Disposition: A | Discharge status: Home / Self Care | Payer: Medicare Other | Attending: Emergency Medicine | Admitting: Emergency Medicine

## 2013-03-15 DIAGNOSIS — Z87891 Personal history of nicotine dependence: Secondary | ICD-10-CM | POA: Insufficient documentation

## 2013-03-15 DIAGNOSIS — M129 Arthropathy, unspecified: Secondary | ICD-10-CM | POA: Insufficient documentation

## 2013-03-15 DIAGNOSIS — Z7902 Long term (current) use of antithrombotics/antiplatelets: Secondary | ICD-10-CM | POA: Insufficient documentation

## 2013-03-15 DIAGNOSIS — Z8719 Personal history of other diseases of the digestive system: Secondary | ICD-10-CM | POA: Insufficient documentation

## 2013-03-15 DIAGNOSIS — M545 Low back pain, unspecified: Secondary | ICD-10-CM | POA: Insufficient documentation

## 2013-03-15 DIAGNOSIS — Z7982 Long term (current) use of aspirin: Secondary | ICD-10-CM | POA: Insufficient documentation

## 2013-03-15 DIAGNOSIS — I10 Essential (primary) hypertension: Secondary | ICD-10-CM | POA: Insufficient documentation

## 2013-03-15 DIAGNOSIS — K219 Gastro-esophageal reflux disease without esophagitis: Secondary | ICD-10-CM | POA: Insufficient documentation

## 2013-03-15 DIAGNOSIS — Z8709 Personal history of other diseases of the respiratory system: Secondary | ICD-10-CM | POA: Insufficient documentation

## 2013-03-15 DIAGNOSIS — I2510 Atherosclerotic heart disease of native coronary artery without angina pectoris: Secondary | ICD-10-CM | POA: Insufficient documentation

## 2013-03-15 DIAGNOSIS — J45909 Unspecified asthma, uncomplicated: Secondary | ICD-10-CM | POA: Insufficient documentation

## 2013-03-15 DIAGNOSIS — Z79899 Other long term (current) drug therapy: Secondary | ICD-10-CM | POA: Insufficient documentation

## 2013-03-15 DIAGNOSIS — E119 Type 2 diabetes mellitus without complications: Secondary | ICD-10-CM | POA: Insufficient documentation

## 2013-03-15 DIAGNOSIS — F411 Generalized anxiety disorder: Secondary | ICD-10-CM | POA: Insufficient documentation

## 2013-03-15 DIAGNOSIS — Z8679 Personal history of other diseases of the circulatory system: Secondary | ICD-10-CM | POA: Insufficient documentation

## 2013-03-15 DIAGNOSIS — Z794 Long term (current) use of insulin: Secondary | ICD-10-CM | POA: Insufficient documentation

## 2013-03-15 DIAGNOSIS — Z8639 Personal history of other endocrine, nutritional and metabolic disease: Secondary | ICD-10-CM | POA: Insufficient documentation

## 2013-03-15 DIAGNOSIS — R197 Diarrhea, unspecified: Secondary | ICD-10-CM | POA: Insufficient documentation

## 2013-03-15 DIAGNOSIS — IMO0001 Reserved for inherently not codable concepts without codable children: Secondary | ICD-10-CM | POA: Insufficient documentation

## 2013-03-15 DIAGNOSIS — G4733 Obstructive sleep apnea (adult) (pediatric): Secondary | ICD-10-CM | POA: Insufficient documentation

## 2013-03-15 DIAGNOSIS — G8929 Other chronic pain: Secondary | ICD-10-CM | POA: Insufficient documentation

## 2013-03-15 DIAGNOSIS — Z862 Personal history of diseases of the blood and blood-forming organs and certain disorders involving the immune mechanism: Secondary | ICD-10-CM | POA: Insufficient documentation

## 2013-03-15 DIAGNOSIS — Z9861 Coronary angioplasty status: Secondary | ICD-10-CM | POA: Insufficient documentation

## 2013-03-15 DIAGNOSIS — R011 Cardiac murmur, unspecified: Secondary | ICD-10-CM | POA: Insufficient documentation

## 2013-03-15 DIAGNOSIS — Z8701 Personal history of pneumonia (recurrent): Secondary | ICD-10-CM | POA: Insufficient documentation

## 2013-03-15 HISTORY — DX: Diverticulosis of intestine, part unspecified, without perforation or abscess without bleeding: K57.90

## 2013-03-15 MED ORDER — OXYCODONE-ACETAMINOPHEN 5-325 MG PO TABS
1.0000 | ORAL_TABLET | Freq: Once | ORAL | Status: AC
  Administered 2013-03-15: 1 via ORAL
  Filled 2013-03-15: qty 1

## 2013-03-15 MED ORDER — CYCLOBENZAPRINE HCL 10 MG PO TABS
10.0000 mg | ORAL_TABLET | Freq: Once | ORAL | Status: AC
  Administered 2013-03-15: 10 mg via ORAL
  Filled 2013-03-15: qty 1

## 2013-03-15 NOTE — ED Provider Notes (Signed)
CSN: [id number]     Arrival date & time 03/15/13  1208 History   First MD Initiated Contact with Patient 03/15/13 1330     Chief Complaint  Patient presents with  . Back Pain   (Consider location/radiation/quality/duration/timing/severity/associated sxs/prior Treatment) HPI [name] is a 62 y.o. female who presents to the ED with low back pain. She has chronic back pain and Fibromyalgia. She is treated by her doctor at [facility]. She take Vicodin for pain but for the past 3 weeks her back still hurts.  The pain is on the right side and radiates to the right buttock. She called her doctor's office today and has an appointment at her doctor's office tomorrow. She told them she could not wait until then and was going to the ED. The pain has been ongoing for 3 weeks. She wanted to cancel her appointment with her doctor tomorrow and get a strong medication here until her next regular appointment.  Patient took one vicodin last night and has taken nothing today for pain.    Past Medical History  Diagnosis Date  . Essential hypertension, benign   . Mixed hyperlipidemia   . GERD (gastroesophageal reflux disease)   . Anxiety   . Type 2 diabetes mellitus   . IBS (irritable bowel syndrome)   . Asthma   . Fibromyalgia   . Complication of anesthesia     "took long time to get me awake once in 2008" (12/09/2012)  . Pancreatitis 2008  . Barrett esophagus   . Coronary artery disease   . Heart murmur     "when I was young & noted when I had menstrual periods" (12/09/2012)  . Anginal pain   . Liver cyst     "told I did; they couldn't find it when rechecked" (12/09/2012)  . Pneumonia 1990's    "2-3 times; before I quit smoking in 1995" (12/09/2012)  . Chronic bronchitis     "when I was smoking and now when I'm around smoking' (12/09/2012)  . Exertional shortness of breath     "sometimes recently when I'm just setting" (12/09/2012)  . OSA on CPAP     "wear mask when sinuses aren't  hurting me" (12/09/2012)  . Anemia     "when I was young" (12/09/2012)  . H/O hiatal hernia   . Sinus headache     "alot" (12/09/2012)  . Migraines     "off and on" (12/09/2012)  . Arthritis     "hips, shoulder, elbows, feet" (12/09/2012)  . Chronic lower back pain     "I have 3 bulging discs" (12/09/2012)  . Diverticulosis    Past Surgical History  Procedure Laterality Date  . Toe fusion Right 10/11    First MTP joint  . Colonoscopy  10/13    Dr. Teena Dunk - tubular adenomas and diverticulosis  . Cholecystectomy  1990'S  . Appendectomy  ?1987  . Abdominal hysterectomy  ?1987  . Carpal tunnel release Bilateral ?1990  . Cataract extraction w/ intraocular lens  implant, bilateral Bilateral ?2005  . Cardiac catheterization  ?2003  . Coronary angioplasty with stent placement  12/09/2012    "1" (12/09/2012)  . Humerus fracture surgery Right 07/19/1969    "horst threw me" (12/09/2012)  . Bunionectomy Bilateral 1972  . Toe surgery Right 1990's    "toe next to big toe:  dr said I had tumor; cut bone & stuff; another OR dr stretched tendons, etc" (12/09/2012)  . Nasal septum surgery  1970's  . Dilation and curettage of uterus  1970's    "probably 3" (12/09/2012)  . Tubal ligation  1985  . Cesarean section  1985  . Ercp  2008  . Esophageal dilation  ` 2012   Family History  Problem Relation Age of Onset  . Stroke Mother   . Cirrhosis Father    History  Substance Use Topics  . Smoking status: Former Smoker -- 0.80 packs/day for 15 years    Types: Cigarettes    Quit date: 07/20/1993  . Smokeless tobacco: Never Used  . Alcohol Use: No   OB History   Grav Para Term Preterm Abortions TAB SAB Ect Mult Living                 Review of Systems  Constitutional: Negative for fever and chills.  HENT: Negative for neck pain.   Respiratory: Negative for shortness of breath.   Gastrointestinal: Positive for diarrhea. Negative for nausea and vomiting.  Genitourinary: Negative for dysuria and  frequency.  Musculoskeletal: Positive for back pain.  Skin: Negative for rash.  Neurological: Negative for headaches.  Psychiatric/Behavioral: The patient is not nervous/anxious.     Allergies  Erythromycin base; Remeron; Zolpidem tartrate; and Sulfonamide derivatives  Home Medications   Current Outpatient Rx  Name  Route  Sig  Dispense  Refill  . albuterol (PROVENTIL HFA;VENTOLIN HFA) 108 (90 BASE) MCG/ACT inhaler   Inhalation   Inhale 2 puffs into the lungs every 6 (six) hours as needed for wheezing.         Marland Kitchen ALPRAZolam (XANAX) 1 MG tablet      Take 1 tablet by mouth at bedtime          . amLODipine (NORVASC) 2.5 MG tablet   Oral   Take 1 tablet (2.5 mg total) by mouth daily.   90 tablet   1   . Armodafinil (NUVIGIL) 250 MG tablet   Oral   Take 250 mg by mouth daily as needed (to stay awake).         Marland Kitchen aspirin EC 81 MG tablet   Oral   Take 81 mg by mouth daily.         . Biotin 5000 MCG TABS      1 tablet. Take 1 tablet by mouth once daily         . cetirizine (ZYRTEC) 10 MG tablet   Oral   Take 10 mg by mouth daily.           . clopidogrel (PLAVIX) 75 MG tablet   Oral   Take 1 tablet (75 mg total) by mouth daily with breakfast.   30 tablet   10   . DULoxetine (CYMBALTA) 60 MG capsule   Oral   Take 60 mg by mouth daily.           Marland Kitchen gabapentin (NEURONTIN) 300 MG capsule   Oral   Take 300 mg by mouth daily as needed (for nerve pain).         Marland Kitchen HYDROcodone-acetaminophen (NORCO/VICODIN) 5-325 MG per tablet   Oral   Take 1 tablet by mouth every 6 (six) hours as needed for pain.         Marland Kitchen insulin glargine (LANTUS) 100 UNIT/ML injection   Subcutaneous   Inject 40-60 Units into the skin at bedtime. 150-200=40 units; >200=60 units         . lidocaine (LIDODERM) 5 %   Transdermal   Place 3 patches  onto the skin daily as needed. Remove & Discard patch within 12 hours or as directed by MD         . losartan-hydrochlorothiazide  (HYZAAR) 100-25 MG per tablet   Oral   Take 1 tablet by mouth daily.           . metFORMIN (GLUMETZA) 500 MG (MOD) 24 hr tablet   Oral   Take 1 tablet (500 mg total) by mouth daily with breakfast.   30 tablet   4   . metoprolol tartrate (LOPRESSOR) 25 MG tablet   Oral   Take 1 tablet (25 mg total) by mouth 2 (two) times daily.   60 tablet   10   . montelukast (SINGULAIR) 10 MG tablet   Oral   Take 10 mg by mouth daily.           Marland Kitchen NEXIUM 40 MG capsule   Oral   Take 1 capsule by mouth daily.         . nitroGLYCERIN (NITROSTAT) 0.4 MG SL tablet   Sublingual   Place 1 tablet (0.4 mg total) under the tongue every 5 (five) minutes as needed for chest pain.   30 tablet   12   . Probiotic Product (PROBIOTIC PO)      Take 1 capsule by mouth once daily          . traZODone (DESYREL) 50 MG tablet   Oral   Take 200 mg by mouth at bedtime.          . Vitamin D, Ergocalciferol, (DRISDOL) 50000 UNITS CAPS   Oral   Take 5,000 Units by mouth 2 (two) times daily.           BP 168/65  Pulse 66  Temp(Src) 98 F (36.7 C) (Oral)  Resp 24  Ht 5\' 3"  (1.6 m)  Wt 148 lb (67.132 kg)  BMI 26.22 kg/m2  SpO2 97% Physical Exam  Nursing note and vitals reviewed. Constitutional: She is oriented to person, place, and time. She appears well-developed and well-nourished. No distress.  HENT:  Head: Normocephalic.  Eyes: EOM are normal.  Neck: Neck supple.  Cardiovascular: Normal rate and regular rhythm.   Pulmonary/Chest: Effort normal. Wheezes: occasional.  Abdominal: Soft. There is no tenderness.  Musculoskeletal:       Lumbar back: She exhibits decreased range of motion and tenderness.       Back:  Pain with palpation over right sciatic nerve. Pedal pulses equal, adequate circulation, good touch sensation.  Neurological: She is alert and oriented to person, place, and time. She has normal strength and normal reflexes. No cranial nerve deficit or sensory deficit. Gait  normal.  Skin: Skin is warm and dry.  Psychiatric: She has a normal mood and affect. Her behavior is normal.    ED Course  Procedures  MDM  62 y.o. female with chronic back pain. Discussed with patient that the ED is not a good place for chronic pain management and she should keep her appointment with her PCP for tomorrow for evaluation and referral as needed for her chronic pain. Patient voices understanding. Patient stable for discharge home without any immediate complications.    Medication List    ASK your doctor about these medications       albuterol 108 (90 BASE) MCG/ACT inhaler  Commonly known as:  PROVENTIL HFA;VENTOLIN HFA  Inhale 2 puffs into the lungs every 6 (six) hours as needed for wheezing.     ALPRAZolam 1  MG tablet  Commonly known as:  XANAX  Take 1 tablet by mouth at bedtime     amLODipine 2.5 MG tablet  Commonly known as:  NORVASC  Take 1 tablet (2.5 mg total) by mouth daily.     aspirin EC 81 MG tablet  Take 81 mg by mouth daily.     Biotin 5000 MCG Tabs  1 tablet. Take 1 tablet by mouth once daily     cetirizine 10 MG tablet  Commonly known as:  ZYRTEC  Take 10 mg by mouth daily.     clopidogrel 75 MG tablet  Commonly known as:  PLAVIX  Take 1 tablet (75 mg total) by mouth daily with breakfast.     DULoxetine 60 MG capsule  Commonly known as:  CYMBALTA  Take 60 mg by mouth daily.     gabapentin 300 MG capsule  Commonly known as:  NEURONTIN  Take 300 mg by mouth daily as needed (for nerve pain).     HYDROcodone-acetaminophen 5-325 MG per tablet  Commonly known as:  NORCO/VICODIN  Take 1 tablet by mouth every 6 (six) hours as needed for pain.     insulin glargine 100 UNIT/ML injection  Commonly known as:  LANTUS  Inject 40-60 Units into the skin at bedtime. 150-200=40 units; >200=60 units     lidocaine 5 %  Commonly known as:  LIDODERM  Place 3 patches onto the skin daily as needed. Remove & Discard patch within 12 hours or as directed by  MD     losartan-hydrochlorothiazide 100-25 MG per tablet  Commonly known as:  HYZAAR  Take 1 tablet by mouth daily.     metFORMIN 500 MG (MOD) 24 hr tablet  Commonly known as:  GLUMETZA  Take 1 tablet (500 mg total) by mouth daily with breakfast.     metoprolol tartrate 25 MG tablet  Commonly known as:  LOPRESSOR  Take 1 tablet (25 mg total) by mouth 2 (two) times daily.     montelukast 10 MG tablet  Commonly known as:  SINGULAIR  Take 10 mg by mouth daily.     NEXIUM 40 MG capsule  Generic drug:  esomeprazole  Take 1 capsule by mouth daily.     nitroGLYCERIN 0.4 MG SL tablet  Commonly known as:  NITROSTAT  Place 1 tablet (0.4 mg total) under the tongue every 5 (five) minutes as needed for chest pain.     NUVIGIL 250 MG tablet  Generic drug:  Armodafinil  Take 250 mg by mouth daily as needed (to stay awake).     PROBIOTIC PO  Take 1 capsule by mouth once daily     traZODone 50 MG tablet  Commonly known as:  DESYREL  Take 200 mg by mouth at bedtime.     Vitamin D (Ergocalciferol) 50000 UNITS Caps capsule  Commonly known as:  DRISDOL  Take 5,000 Units by mouth 2 (two) times daily.           [address] [place], [place] 03/16/13 [phone]

## 2013-03-15 NOTE — ED Notes (Signed)
C/o lower back pain, worse on right side radiating down buttock.  Denies injury.

## 2013-03-15 NOTE — ED Notes (Signed)
Low back pain for 3 weeks. Has used heat and ice without relief. No  Injury.

## 2013-03-16 LAB — GLUCOSE, CAPILLARY: Glucose-Capillary: 105 mg/dL — ABNORMAL HIGH (ref 70–99)

## 2013-03-17 ENCOUNTER — Encounter (HOSPITAL_COMMUNITY): Payer: Medicare Other

## 2013-03-17 NOTE — ED Provider Notes (Signed)
Medical screening examination/treatment/procedure(s) were performed by non-physician practitioner and as supervising physician I was immediately available for consultation/collaboration.  Donnetta Hutching, MD 03/17/13 [phone]

## 2013-03-20 ENCOUNTER — Encounter (HOSPITAL_COMMUNITY): Payer: Medicare Other

## 2013-03-22 ENCOUNTER — Encounter (HOSPITAL_COMMUNITY): Payer: Medicare Other

## 2013-03-24 ENCOUNTER — Encounter (HOSPITAL_COMMUNITY): Payer: Medicare Other

## 2013-03-27 ENCOUNTER — Encounter: Payer: Self-pay | Admitting: Cardiology

## 2013-03-27 ENCOUNTER — Encounter (HOSPITAL_COMMUNITY): Payer: Medicare Other

## 2013-03-27 ENCOUNTER — Ambulatory Visit (INDEPENDENT_AMBULATORY_CARE_PROVIDER_SITE_OTHER): Payer: Medicare Other | Admitting: Cardiology

## 2013-03-27 VITALS — BP 135/74 | HR 62 | Ht 63.0 in | Wt 151.8 lb

## 2013-03-27 DIAGNOSIS — I2510 Atherosclerotic heart disease of native coronary artery without angina pectoris: Secondary | ICD-10-CM

## 2013-03-27 DIAGNOSIS — I10 Essential (primary) hypertension: Secondary | ICD-10-CM

## 2013-03-27 DIAGNOSIS — E782 Mixed hyperlipidemia: Secondary | ICD-10-CM

## 2013-03-27 DIAGNOSIS — E119 Type 2 diabetes mellitus without complications: Secondary | ICD-10-CM

## 2013-03-27 MED ORDER — CLOPIDOGREL BISULFATE 75 MG PO TABS: 75.0000 mg | ORAL_TABLET | Freq: Every day | ORAL | Status: DC

## 2013-03-27 MED ORDER — AMLODIPINE BESYLATE 2.5 MG PO TABS: 2.5000 mg | ORAL_TABLET | Freq: Every day | ORAL | Status: DC

## 2013-03-27 MED ORDER — METOPROLOL TARTRATE 25 MG PO TABS: 25.0000 mg | ORAL_TABLET | Freq: Two times a day (BID) | ORAL | Status: DC

## 2013-03-27 MED ORDER — NITROGLYCERIN 0.4 MG SL SUBL: 0.4000 mg | SUBLINGUAL_TABLET | SUBLINGUAL | Status: DC | PRN

## 2013-03-27 NOTE — Assessment & Plan Note (Signed)
Keep followup with Dr. Daniel. 

## 2013-03-27 NOTE — Assessment & Plan Note (Signed)
Symptomatically stable status post DES to LAD in May of this year. Continue medical therapy including DAPT. Prescriptions provided today for Norvasc, Lopressor, Plavix, and nitroglycerin. Followup arranged.

## 2013-03-27 NOTE — Assessment & Plan Note (Addendum)
She was just changed from Lipitor to Zocor by her primary care provider. There was concern that Lipitor may have been leading to inadequate blood glucose control. Recent LDL 144.

## 2013-03-27 NOTE — Assessment & Plan Note (Signed)
Continue current medical regimen.

## 2013-03-27 NOTE — Progress Notes (Signed)
Clinical Summary Ms. Pedley is a 62 y.o.female last seen in the office by Ms. Lawrence NP back in June. History reviewed below. She states that she has had trouble with back pain, episode of pneumonia, and Lyme disease since last visit. Has used NTG on one occasion only.  She reports compliance with her medications. No unusual bleeding problems on DAPT. She indicated that she might need a dental procedure in the next few months. I stressed the importance of continuing DAPT, and would strive not to interrupt his early in her one years course if at all possible. At least get through 6 months since placement of DES in May.   Allergies  Allergen Reactions  . Erythromycin Base Diarrhea  . Remeron [Mirtazapine]     Makes her too sleepy  . Zolpidem Tartrate     amnesia  . Sulfonamide Derivatives Rash    Mouth sores    Current Outpatient Prescriptions  Medication Sig Dispense Refill  . albuterol (PROVENTIL HFA;VENTOLIN HFA) 108 (90 BASE) MCG/ACT inhaler Inhale 2 puffs into the lungs every 6 (six) hours as needed for wheezing.      Marland Kitchen ALPRAZolam (XANAX) 1 MG tablet Take 1 tablet by mouth at bedtime       . amLODipine (NORVASC) 2.5 MG tablet Take 1 tablet (2.5 mg total) by mouth daily.  90 tablet  1  . Armodafinil (NUVIGIL) 250 MG tablet Take 250 mg by mouth daily as needed (to stay awake).      Marland Kitchen aspirin EC 81 MG tablet Take 81 mg by mouth daily.      . Biotin 5000 MCG TABS 1 tablet. Take 1 tablet by mouth once daily      . cetirizine (ZYRTEC) 10 MG tablet Take 10 mg by mouth daily.        . chlorhexidine (PERIDEX) 0.12 % solution       . clopidogrel (PLAVIX) 75 MG tablet Take 1 tablet (75 mg total) by mouth daily with breakfast.  90 tablet  1  . DULoxetine (CYMBALTA) 60 MG capsule Take 60 mg by mouth daily.        Marland Kitchen gabapentin (NEURONTIN) 300 MG capsule Take 300 mg by mouth daily as needed (for nerve pain).      Marland Kitchen HYDROcodone-acetaminophen (NORCO/VICODIN) 5-325 MG per tablet Take 1 tablet by  mouth every 6 (six) hours as needed for pain.      Marland Kitchen insulin glargine (LANTUS) 100 UNIT/ML injection Inject 40-60 Units into the skin at bedtime. 150-200=40 units; >200=60 units      . lidocaine (LIDODERM) 5 % Place 3 patches onto the skin daily as needed. Remove & Discard patch within 12 hours or as directed by MD      . losartan-hydrochlorothiazide (HYZAAR) 100-25 MG per tablet Take 1 tablet by mouth daily.        . metFORMIN (GLUMETZA) 500 MG (MOD) 24 hr tablet Take 1 tablet (500 mg total) by mouth daily with breakfast.  30 tablet  4  . metoprolol tartrate (LOPRESSOR) 25 MG tablet Take 1 tablet (25 mg total) by mouth 2 (two) times daily.  180 tablet  1  . montelukast (SINGULAIR) 10 MG tablet Take 10 mg by mouth daily.        Marland Kitchen NEXIUM 40 MG capsule Take 1 capsule by mouth daily.      . nitroGLYCERIN (NITROSTAT) 0.4 MG SL tablet Place 1 tablet (0.4 mg total) under the tongue every 5 (five) minutes as needed for chest  pain.  90 tablet  1  . Probiotic Product (PROBIOTIC PO) Take 1 capsule by mouth once daily       . simvastatin (ZOCOR) 40 MG tablet Take 40 mg by mouth at bedtime.       . traZODone (DESYREL) 50 MG tablet Take 200 mg by mouth at bedtime.       . Vitamin D, Ergocalciferol, (DRISDOL) 50000 UNITS CAPS Take 5,000 Units by mouth 2 (two) times daily.        No current facility-administered medications for this visit.    Past Medical History  Diagnosis Date  . Essential hypertension, benign   . Mixed hyperlipidemia   . GERD (gastroesophageal reflux disease)   . Anxiety   . Type 2 diabetes mellitus   . IBS (irritable bowel syndrome)   . Asthma   . Fibromyalgia   . Pancreatitis 2008  . Barrett esophagus   . Coronary atherosclerosis of native coronary artery     DES to LAD May 2014  . Pneumonia 1990's  . Chronic bronchitis   . OSA on CPAP   . H/O hiatal hernia   . Migraines   . Arthritis   . Chronic lower back pain   . Diverticulosis     Social History Ms. Sturdivant reports  that she quit smoking about 19 years ago. Her smoking use included Cigarettes. She has a 12 pack-year smoking history. She has never used smokeless tobacco. Ms. Lingenfelter reports that she does not drink alcohol.  Review of Systems Admit palpitations or syncope. Otherwise as outlined above.  Physical Examination Filed Vitals:   03/27/13 1250  BP: 135/74  Pulse: 62   Filed Weights   03/27/13 1250  Weight: 151 lb 12 oz (68.833 kg)    No acute distress.  HEENT: Conjunctiva and lids normal, oropharynx clear.  Neck: Supple, no elevated JVP or carotid bruits, no thyromegaly.  Lungs: Diminished but clear to auscultation, nonlabored breathing at rest.  Cardiac: Regular rate and rhythm, no S3 or significant systolic murmur, no pericardial rub.  Abdomen: Soft, nontender, bowel sounds present, no guarding or rebound.  Extremities: No pitting edema, distal pulses 2+.  Skin: Warm and dry.  Musculoskeletal: No kyphosis.  Neuropsychiatric: Alert and oriented x3, affect grossly appropriate.   Problem List and Plan   Coronary atherosclerosis of native coronary artery Symptomatically stable status post DES to LAD in May of this year. Continue medical therapy including DAPT. Prescriptions provided today for Norvasc, Lopressor, Plavix, and nitroglycerin. Followup arranged.  Essential hypertension, benign Continue current medical regimen.  Mixed hyperlipidemia She was just changed from Lipitor to Zocor by her primary care provider. There was concern that Lipitor may have been leading to inadequate blood glucose control. Recent LDL 144.  Type 2 diabetes mellitus Keep follow up with Dr. Reuel Boom.    Jonelle Sidle, M.D., F.A.C.C.

## 2013-03-27 NOTE — Patient Instructions (Addendum)
Your physician recommends that you schedule a follow-up appointment in: 3 months.  

## 2013-03-29 ENCOUNTER — Encounter (HOSPITAL_COMMUNITY): Payer: Medicare Other

## 2013-03-31 ENCOUNTER — Encounter (HOSPITAL_COMMUNITY): Payer: Medicare Other

## 2013-04-11 ENCOUNTER — Encounter: Payer: Self-pay | Admitting: Cardiology

## 2013-04-13 ENCOUNTER — Emergency Department (HOSPITAL_COMMUNITY)
Admission: EM | Admit: 2013-04-13 | Discharge: 2013-04-13 | Disposition: A | Discharge status: Home / Self Care | Payer: Medicare Other | Attending: Emergency Medicine | Admitting: Emergency Medicine

## 2013-04-13 ENCOUNTER — Emergency Department (HOSPITAL_COMMUNITY): Payer: Medicare Other

## 2013-04-13 DIAGNOSIS — N12 Tubulo-interstitial nephritis, not specified as acute or chronic: Secondary | ICD-10-CM

## 2013-04-13 DIAGNOSIS — IMO0001 Reserved for inherently not codable concepts without codable children: Secondary | ICD-10-CM | POA: Insufficient documentation

## 2013-04-13 DIAGNOSIS — F411 Generalized anxiety disorder: Secondary | ICD-10-CM | POA: Insufficient documentation

## 2013-04-13 DIAGNOSIS — Z7982 Long term (current) use of aspirin: Secondary | ICD-10-CM | POA: Insufficient documentation

## 2013-04-13 DIAGNOSIS — R05 Cough: Secondary | ICD-10-CM | POA: Insufficient documentation

## 2013-04-13 DIAGNOSIS — Z8669 Personal history of other diseases of the nervous system and sense organs: Secondary | ICD-10-CM | POA: Insufficient documentation

## 2013-04-13 DIAGNOSIS — M545 Low back pain, unspecified: Secondary | ICD-10-CM | POA: Insufficient documentation

## 2013-04-13 DIAGNOSIS — G4733 Obstructive sleep apnea (adult) (pediatric): Secondary | ICD-10-CM | POA: Insufficient documentation

## 2013-04-13 DIAGNOSIS — I2510 Atherosclerotic heart disease of native coronary artery without angina pectoris: Secondary | ICD-10-CM | POA: Insufficient documentation

## 2013-04-13 DIAGNOSIS — I10 Essential (primary) hypertension: Secondary | ICD-10-CM | POA: Insufficient documentation

## 2013-04-13 DIAGNOSIS — E782 Mixed hyperlipidemia: Secondary | ICD-10-CM | POA: Insufficient documentation

## 2013-04-13 DIAGNOSIS — G8929 Other chronic pain: Secondary | ICD-10-CM | POA: Insufficient documentation

## 2013-04-13 DIAGNOSIS — Z8709 Personal history of other diseases of the respiratory system: Secondary | ICD-10-CM | POA: Insufficient documentation

## 2013-04-13 DIAGNOSIS — R109 Unspecified abdominal pain: Secondary | ICD-10-CM | POA: Insufficient documentation

## 2013-04-13 DIAGNOSIS — Z79899 Other long term (current) drug therapy: Secondary | ICD-10-CM | POA: Insufficient documentation

## 2013-04-13 DIAGNOSIS — R059 Cough, unspecified: Secondary | ICD-10-CM | POA: Insufficient documentation

## 2013-04-13 DIAGNOSIS — J45909 Unspecified asthma, uncomplicated: Secondary | ICD-10-CM | POA: Insufficient documentation

## 2013-04-13 DIAGNOSIS — Z8701 Personal history of pneumonia (recurrent): Secondary | ICD-10-CM | POA: Insufficient documentation

## 2013-04-13 DIAGNOSIS — Z794 Long term (current) use of insulin: Secondary | ICD-10-CM | POA: Insufficient documentation

## 2013-04-13 DIAGNOSIS — Z8719 Personal history of other diseases of the digestive system: Secondary | ICD-10-CM | POA: Insufficient documentation

## 2013-04-13 DIAGNOSIS — Z9989 Dependence on other enabling machines and devices: Secondary | ICD-10-CM | POA: Insufficient documentation

## 2013-04-13 DIAGNOSIS — Z87891 Personal history of nicotine dependence: Secondary | ICD-10-CM | POA: Insufficient documentation

## 2013-04-13 DIAGNOSIS — E119 Type 2 diabetes mellitus without complications: Secondary | ICD-10-CM | POA: Insufficient documentation

## 2013-04-13 DIAGNOSIS — Z8739 Personal history of other diseases of the musculoskeletal system and connective tissue: Secondary | ICD-10-CM | POA: Insufficient documentation

## 2013-04-13 LAB — URINE MICROSCOPIC-ADD ON

## 2013-04-13 LAB — URINALYSIS, ROUTINE W REFLEX MICROSCOPIC
Bilirubin Urine: NEGATIVE
Glucose, UA: NEGATIVE mg/dL
Ketones, ur: NEGATIVE mg/dL
Urobilinogen, UA: 1 mg/dL (ref 0.0–1.0)
pH: 7.5 (ref 5.0–8.0)

## 2013-04-13 LAB — CBC WITH DIFFERENTIAL/PLATELET
Basophils Relative: 0 % (ref 0–1)
HCT: 41.3 % (ref 36.0–46.0)
Hemoglobin: 14.2 g/dL (ref 12.0–15.0)
Lymphocytes Relative: 9 % — ABNORMAL LOW (ref 12–46)
Lymphs Abs: 1.3 10*3/uL (ref 0.7–4.0)
MCHC: 34.4 g/dL (ref 30.0–36.0)
Monocytes Absolute: 1.1 10*3/uL — ABNORMAL HIGH (ref 0.1–1.0)
Monocytes Relative: 8 % (ref 3–12)
Neutro Abs: 12 10*3/uL — ABNORMAL HIGH (ref 1.7–7.7)
RBC: 4.51 MIL/uL (ref 3.87–5.11)

## 2013-04-13 LAB — BASIC METABOLIC PANEL
BUN: 10 mg/dL (ref 6–23)
CO2: 29 mEq/L (ref 19–32)
Chloride: 99 mEq/L (ref 96–112)
Creatinine, Ser: 0.68 mg/dL (ref 0.50–1.10)
Sodium: 139 mEq/L (ref 135–145)

## 2013-04-13 MED ORDER — ULTIMATE PROBIOTIC FORMULA PO CAPS: ORAL_CAPSULE | ORAL | Status: DC

## 2013-04-13 MED ORDER — IBUPROFEN 800 MG PO TABS
800.0000 mg | ORAL_TABLET | Freq: Once | ORAL | Status: AC
  Administered 2013-04-13: 800 mg via ORAL
  Filled 2013-04-13: qty 1

## 2013-04-13 MED ORDER — KETOROLAC TROMETHAMINE 30 MG/ML IJ SOLN
30.0000 mg | Freq: Once | INTRAMUSCULAR | Status: AC
  Administered 2013-04-13: 30 mg via INTRAVENOUS
  Filled 2013-04-13: qty 1

## 2013-04-13 MED ORDER — LEVOFLOXACIN 500 MG PO TABS: 500.0000 mg | ORAL_TABLET | Freq: Every day | ORAL | Status: DC

## 2013-04-13 MED ORDER — DEXTROSE 5 % IV SOLN
1.0000 g | Freq: Once | INTRAVENOUS | Status: AC
  Administered 2013-04-13: 1 g via INTRAVENOUS
  Filled 2013-04-13: qty 10

## 2013-04-13 NOTE — ED Notes (Signed)
E signature obtained. Signature pad not working.

## 2013-04-13 NOTE — ED Provider Notes (Addendum)
CSN: [id number]     Arrival date & time 04/13/13  1348 History   First MD Initiated Contact with Patient 04/13/13 1350     Chief Complaint  Patient presents with  . Shortness of Breath   HPI  And has had difficult year. She had Lyme's disease and [facility] spotted fever and may in June of this year. Treated with antibiotics for both. Both diagnosed per her report by blood serology testing. She had a stent for an anterior MI in June or May of this year. She had pneumonia at the end of July and underwent 2 courses of antibiotics.. She has had about 6 weeks of feeling well. She started developing some pain in her back 2 days ago and fevers shakes and chills last night. Temperature to 103.5 at home this morning. She took some Tylenol. She presents here with a temp improved to 100.9. She states she does feel short of breath than usual and had much of a cough and no sputum. No dysuria no hematuria. Appetite but no nausea vomiting. She states she aches all over. No headache no neck pain. No rash  Past Medical History  Diagnosis Date  . Essential hypertension, benign   . Mixed hyperlipidemia   . GERD (gastroesophageal reflux disease)   . Anxiety   . Type 2 diabetes mellitus   . IBS (irritable bowel syndrome)   . Asthma   . Fibromyalgia   . Pancreatitis 2008  . Barrett esophagus   . Coronary atherosclerosis of native coronary artery     DES to LAD May 2014  . Pneumonia 1990's  . Chronic bronchitis   . OSA on CPAP   . H/O hiatal hernia   . Migraines   . Arthritis   . Chronic lower back pain   . Diverticulosis    Past Surgical History  Procedure Laterality Date  . Toe fusion Right 10/11    First MTP joint  . Colonoscopy  10/13    Dr. Teena Dunk - tubular adenomas and diverticulosis  . Cholecystectomy  1990'S  . Appendectomy  ?1987  . Abdominal hysterectomy  ?1987  . Carpal tunnel release Bilateral ?1990  . Cataract extraction w/ intraocular lens  implant, bilateral Bilateral ?2005   . Humerus fracture surgery Right 07/19/1969    "horst threw me" (12/09/2012)  . Bunionectomy Bilateral 1972  . Toe surgery Right 1990's    "toe next to big toe:  dr said I had tumor; cut bone & stuff; another OR dr stretched tendons, etc" (12/09/2012)  . Nasal septum surgery  1970's  . Dilation and curettage of uterus  1970's    "probably 3" (12/09/2012)  . Tubal ligation  1985  . Cesarean section  1985  . Ercp  2008  . Esophageal dilation  ` 2012   Family History  Problem Relation Age of Onset  . Stroke Mother   . Cirrhosis Father    History  Substance Use Topics  . Smoking status: Former Smoker -- 0.80 packs/day for 15 years    Types: Cigarettes    Quit date: 07/20/1993  . Smokeless tobacco: Never Used  . Alcohol Use: No   OB History   Grav Para Term Preterm Abortions TAB SAB Ect Mult Living                 Review of Systems  Constitutional: Negative for fever, chills, diaphoresis, appetite change and fatigue.  HENT: Negative for sore throat, mouth sores and trouble swallowing.  Eyes: Negative for visual disturbance.  Respiratory: Positive for cough and shortness of breath. Negative for chest tightness and wheezing.   Cardiovascular: Negative for chest pain.  Gastrointestinal: Negative for nausea, vomiting, abdominal pain, diarrhea and abdominal distention.  Endocrine: Negative for polydipsia, polyphagia and polyuria.  Genitourinary: Positive for flank pain. Negative for dysuria, frequency and hematuria.  Musculoskeletal: Positive for myalgias and back pain. Negative for gait problem.  Skin: Negative for color change, pallor and rash.  Neurological: Negative for dizziness, syncope, light-headedness and headaches.  Hematological: Does not bruise/bleed easily.  Psychiatric/Behavioral: Negative for behavioral problems and confusion.    Allergies  Erythromycin base; Azithromycin; Remeron; Zolpidem tartrate; and Sulfonamide derivatives  Home Medications   Current  Outpatient Rx  Name  Route  Sig  Dispense  Refill  . acetaminophen (TYLENOL) 500 MG tablet   Oral   Take 500 mg by mouth every 6 (six) hours as needed for pain.          Marland Kitchen ALPRAZolam (XANAX) 1 MG tablet   Oral   Take 1 mg by mouth at bedtime as needed for sleep.         Marland Kitchen amLODipine (NORVASC) 2.5 MG tablet   Oral   Take 1 tablet (2.5 mg total) by mouth daily.   90 tablet   1   . Armodafinil (NUVIGIL) 250 MG tablet   Oral   Take 250 mg by mouth daily as needed (to stay awake).         Marland Kitchen aspirin EC 81 MG tablet   Oral   Take 81 mg by mouth daily.         Marland Kitchen BIOTIN PO   Oral   Take 1 tablet by mouth every morning.         . cetirizine (ZYRTEC) 10 MG tablet   Oral   Take 10 mg by mouth daily.           . chlorhexidine (PERIDEX) 0.12 % solution               . clopidogrel (PLAVIX) 75 MG tablet   Oral   Take 1 tablet (75 mg total) by mouth daily with breakfast.   90 tablet   1   . DULoxetine (CYMBALTA) 60 MG capsule   Oral   Take 60 mg by mouth daily.           Marland Kitchen gabapentin (NEURONTIN) 300 MG capsule   Oral   Take 300 mg by mouth daily as needed (for nerve pain).         Marland Kitchen HYDROcodone-acetaminophen (NORCO/VICODIN) 5-325 MG per tablet   Oral   Take 1 tablet by mouth every 6 (six) hours as needed for pain. Mostly takes at bedtime, but may take prn for nerve pain.         Marland Kitchen insulin glargine (LANTUS) 100 UNIT/ML injection   Subcutaneous   Inject 25-50 Units into the skin at bedtime. >150 = 45-50units; <130 = 25-35units         . lidocaine (LIDODERM) 5 %   Transdermal   Place 3 patches onto the skin daily as needed. Remove & Discard patch within 12 hours or as directed by MD         . losartan-hydrochlorothiazide (HYZAAR) 100-25 MG per tablet   Oral   Take 1 tablet by mouth daily.           . metFORMIN (GLUMETZA) 500 MG (MOD) 24 hr tablet   Oral  Take 1 tablet (500 mg total) by mouth daily with breakfast.   30 tablet   4   .  metoprolol tartrate (LOPRESSOR) 25 MG tablet   Oral   Take 1 tablet (25 mg total) by mouth 2 (two) times daily.   180 tablet   1   . montelukast (SINGULAIR) 10 MG tablet   Oral   Take 10 mg by mouth daily.           Marland Kitchen NEXIUM 40 MG capsule   Oral   Take 1 capsule by mouth daily.         . nitroGLYCERIN (NITROSTAT) 0.4 MG SL tablet   Sublingual   Place 1 tablet (0.4 mg total) under the tongue every 5 (five) minutes as needed for chest pain.   90 tablet   1   . Probiotic Product (PROBIOTIC PO)      Take 1 capsule by mouth once daily          . simvastatin (ZOCOR) 40 MG tablet   Oral   Take 40 mg by mouth at bedtime.          . traZODone (DESYREL) 50 MG tablet   Oral   Take 200 mg by mouth at bedtime.          Marland Kitchen VITAMIN D, CHOLECALCIFEROL, PO   Oral   Take 1 tablet by mouth every morning.         Marland Kitchen albuterol (PROVENTIL HFA;VENTOLIN HFA) 108 (90 BASE) MCG/ACT inhaler   Inhalation   Inhale 2 puffs into the lungs every 6 (six) hours as needed for wheezing.         . Lactobacillus (ULTIMATE PROBIOTIC FORMULA) CAPS      1 capsule daily of OTC probiotic per pharmacy recommendation   20 capsule   0   . levofloxacin (LEVAQUIN) 500 MG tablet   Oral   Take 1 tablet (500 mg total) by mouth daily.   10 tablet   0    BP 100/44  Pulse 73  Temp(Src) 99.6 F (37.6 C) (Oral)  Resp 18  SpO2 94% Physical Exam  Constitutional: She is oriented to person, place, and time. She appears well-developed and well-nourished. No distress.  Appears to not feel well. She is not diaphoretic or pale. She is speaking in full sentences. Mentating well.  HENT:  Head: Normocephalic.  Eyes: Conjunctivae are normal. Pupils are equal, round, and reactive to light. No scleral icterus.  Neck: Normal range of motion. Neck supple. No thyromegaly present.  Cardiovascular: Normal rate, regular rhythm and normal heart sounds.  Exam reveals no gallop and no friction rub.   No murmur  heard. Tachycardic in triage. Resting heart rate at the bedside is in the 90s.  Pulmonary/Chest: Effort normal and breath sounds normal. No respiratory distress. She has no wheezes. She has no rales.  Clear lungs. No increased work of  breathing. No crackles rales wheezing or prolongation. No focal signs of consolidation  Abdominal: Soft. Bowel sounds are normal. She exhibits no distension. There is no tenderness. There is no rebound.  Entry abdomen soft benign. She is tender to percuss over the costal phrenic angles.    Musculoskeletal: Normal range of motion.  No joint pain or joint effusion  Neurological: She is alert and oriented to person, place, and time.  Skin: Skin is warm and dry. No rash noted.  No rash  Psychiatric: She has a normal mood and affect. Her behavior is normal.  ED Course  Procedures (including critical care time) Labs Review Labs Reviewed  CBC WITH DIFFERENTIAL - Abnormal; Notable for the following:    WBC 14.4 (*)    Neutrophils Relative % 83 (*)    Neutro Abs 12.0 (*)    Lymphocytes Relative 9 (*)    Monocytes Absolute 1.1 (*)    All other components within normal limits  URINALYSIS, ROUTINE W REFLEX MICROSCOPIC - Abnormal; Notable for the following:    APPearance CLOUDY (*)    Leukocytes, UA MODERATE (*)    All other components within normal limits  URINE MICROSCOPIC-ADD ON - Abnormal; Notable for the following:    Bacteria, UA FEW (*)    All other components within normal limits  URINE CULTURE  BASIC METABOLIC PANEL   Imaging Review Dg Chest 2 View  04/13/2013   CLINICAL DATA:  Shortness of breath, fever  EXAM: CHEST  2 VIEW  COMPARISON:  03/08/2013  FINDINGS: Cardiomediastinal silhouette is stable. No acute infiltrate or pulmonary edema. Central mild bronchitic changes. Stable mild scarring in right middle lobe. Bony thorax is unremarkable.  IMPRESSION: No acute infiltrate or pulmonary edema. Central mild bronchitic changes.   Electronically Signed    By: Natasha Mead   On: 04/13/2013 15:11   EKG. Indication dyspnea. Termination sinus rhythm rate of 80. No acute ischemic changes injury or ectopy. Ventricular conduction delay without widening. No ectopy.  MDM   1. Pyelonephritis    Normal x-ray. Her urine appears infected. Cultures pending. Given IV Rocephin. Plan to discharge home. She is well oxygenated hemodynamic stable here. Showing no signs of sepsis or acidosis. Plan Levaquin rest antipyretics fluids reach her primary care physician. Recheck ER if acute changes.    Roney Marion, MD 04/13/13 1650  Roney Marion, MD 04/13/13 1650  Roney Marion, MD 04/13/13 Ernestina Columbia

## 2013-04-13 NOTE — ED Notes (Signed)
Per EMS: Pt from hom with c/o SOB since 0800 today and fever 105 at home. Lung sound clear. Dx with pneumonia x 1 week; finished abx. 93 % RA, 2L increased to 97%. NAD. BG 186. 97 NSR. 137/71

## 2013-04-13 NOTE — ED Notes (Signed)
Pt reports dx [facility] Spider Fever and Lyme disease dx this year. Denies any new pain, recent rash. States she is having 7/10 bilateral rib pain "from coughing" and lower back pain. Denies urinary syx.

## 2013-04-14 LAB — URINE CULTURE

## 2013-04-17 ENCOUNTER — Telehealth: Payer: Self-pay | Admitting: Pulmonary Disease

## 2013-04-17 NOTE — Telephone Encounter (Signed)
Spoke with patient-- Patient last seen 07/15/11-- states she has recently come down with lots of different illnesses in which has made it more dfficult for her to wear her CPAP Patient is wanting to come in and be seen by [facility] more regularly First available for Dr. Shelle Iron not until first of November, per patient this is too late because with all her other health problems she really needs to get back on CPAP [name] please advise if patient can be worked in sooner! Thanks!!

## 2013-04-17 NOTE — Telephone Encounter (Signed)
Copied from My Chart:  From [name] To Patient Appointment Schedule Request Mailing List [P [id number]] Composed 04/17/2013 1:32 PM For Delivery On 04/17/2013 1:32 PM Subject Appointment Request Message Type Patient Appointment Schedule Request Read Status Y Message Body Appointment Request From: [name]   With Provider: Barbaraann Share, MD [facility]]   Preferred Date Range: From 05/02/2013 To 05/19/2013   Preferred Times: Monday Afternoon, Tuesday Afternoon, Wednesday Afternoon, Thursday Afternoon, Friday Afternoon   Reason for visit: Acute Office Visit   Comments:  I need to see Dr. Shelle Iron and get back on my bipap, quit a bunch of medications and see only [facility] related staff, physicians, etc. You can pull my chart and see some of my many medical problems this year. My primary doctor has not been able to handle my health like he thought he could plus he does not communicate with [facility]. Thank you

## 2013-04-18 NOTE — Telephone Encounter (Signed)
Spoke with the pt and this appt was scheduled She states nothing further needed

## 2013-04-18 NOTE — Telephone Encounter (Signed)
Available appt Friday Oct 3 at 11a with KC---does not need to be a consult

## 2013-04-21 ENCOUNTER — Encounter: Payer: Self-pay | Admitting: Pulmonary Disease

## 2013-04-21 ENCOUNTER — Encounter: Payer: Self-pay | Admitting: Gastroenterology

## 2013-04-21 ENCOUNTER — Ambulatory Visit (INDEPENDENT_AMBULATORY_CARE_PROVIDER_SITE_OTHER): Payer: Medicare Other | Admitting: Pulmonary Disease

## 2013-04-21 VITALS — BP 136/74 | HR 84 | Temp 98.3°F | Ht 62.0 in | Wt 149.8 lb

## 2013-04-21 DIAGNOSIS — G4733 Obstructive sleep apnea (adult) (pediatric): Secondary | ICD-10-CM

## 2013-04-21 NOTE — Patient Instructions (Addendum)
Would not wear your bilevel until you are able to resolve your issue with vomiting and reflux during the night.  Would recommend that you see a gastroenterologist. Once your GI issue is resolved, restart on bilevel, and let me know if you are having issues with tolerance.   If you are doing well with bilevel after restarting, followup with me in one year.

## 2013-04-21 NOTE — Assessment & Plan Note (Signed)
The patient has not been able to wear bilevel because of nocturnal vomiting with reflux into the back of her throat.  I have told her it is very important that she see a gastroenterologist, and once this problem is resolved, she needs to start back on her bilevel.  She is to call me if she has any issues with her equipment or bilevel tolerance.

## 2013-04-21 NOTE — Progress Notes (Signed)
  Subjective:    Patient ID: [name], female    DOB: September 07, 1950, 63 y.o.   MRN: [id number]  HPI Patient comes in today for followup of her obstructive sleep apnea.  She has been on bilevel in the past for her mild sleep apnea, but has not been able to wear her device most recently.  She has had severe vomiting at night with reflux up into her throat, and has even had choking episodes.  She has not seen a gastroenterologist as of yet.  She is concerned about aspiration, and rightly has not worn her bilevel device.  Since that time, she has seen worsening of her sleep and daytime alertness.   Review of Systems  Constitutional: Negative for fever and unexpected weight change.  HENT: Positive for congestion, sore throat, sneezing, trouble swallowing, postnasal drip and sinus pressure. Negative for ear pain, nosebleeds, rhinorrhea and dental problem.   Eyes: Negative for redness and itching.  Respiratory: Positive for cough, chest tightness, shortness of breath and wheezing.   Cardiovascular: Positive for palpitations. Negative for leg swelling.  Gastrointestinal: Positive for vomiting. Negative for nausea.  Genitourinary: Negative for dysuria.  Musculoskeletal: Positive for joint swelling.  Skin: Negative for rash.  Neurological: Positive for headaches.  Hematological: Bruises/bleeds easily.  Psychiatric/Behavioral: Negative for dysphoric mood. The patient is not nervous/anxious.        Objective:   Physical Exam Thin female in no acute distress Nose without purulence or discharge noted No skin breakdown or pressure necrosis from the CPAP mask Neck without lymphadenopathy or thyromegaly Lower extremities without edema, no cyanosis Alert and oriented, moves all 4 extremities.       Assessment & Plan:

## 2013-05-23 ENCOUNTER — Ambulatory Visit: Payer: Medicare Other | Admitting: Gastroenterology

## 2013-05-25 ENCOUNTER — Other Ambulatory Visit: Payer: Self-pay

## 2013-06-20 NOTE — Addendum Note (Signed)
Encounter addended by: Angelica Pou, RN on: 06/20/2013  1:54 PM<BR>     Documentation filed: Notes Section

## 2013-06-20 NOTE — Progress Notes (Signed)
Cardiac Rehabilitation Program Outcomes Report   Orientation:  01/05/2013 Graduate Date:  NA Discharge Date:  02/13/2013 # of sessions completed: 9 DX: STENT X 1  Cardiologist: Hermina Staggers MD:  Ina Kick Time:  09:30  A.  Exercise Program:  Tolerates exercise @ 3.72 METS for 15 minutes, Walk Test Results:  Pre: Pre walk Test: Rest Hr 54, BP 130/62, O2 99%, RPE 6, and RPD 6, 6 min HR 81 , BP 178/72, O2 97% RPE 11 and RPD 9, Post HR 62, BP 142/70, O2 100%, RPE 6 and RPD 6. Walked 1600 ft at 3.0 mph at 3.2 METS. and Discharged  B.  Mental Health:  Good mental attitude  C.  Education/Instruction/Skills  Accurately checks own pulse.  Rest:  67  Exercise: 80, Knows THR for exercise, Uses Perceived Exertion Scale and/or Dyspnea Scale and Attended 3 education classes  Uses Perceived Exertion Scale and/or Dyspnea Scale  D.  Nutrition/Weight Control/Body Composition:  Adherence to prescribed nutrition program: good    E.  Blood Lipids    No results found for this basename: CHOL, HDL, LDLCALC, LDLDIRECT, TRIG, CHOLHDL    F.  Lifestyle Changes:  Making positive lifestyle changes and Not smoking:  Quit 1995  G.  Symptoms noted with exercise:  Asymptomatic  Report Completed By:  Lelon Huh.  Jasmia Angst RN   Comments:  This is patients discharge report. She only attended 9 sessions. She achieved a Peak METS of 3.72. Her resting HR was 67 and resting Bp was 132/60, and peak HR was 80 and peak BP was 148/60. She did well, but never returned to finish program.

## 2013-06-20 NOTE — Addendum Note (Signed)
Encounter addended by: Angelica Pou, RN on: 06/20/2013  1:55 PM<BR>     Documentation filed: Clinical Notes

## 2013-06-23 ENCOUNTER — Encounter: Payer: Self-pay | Admitting: Cardiology

## 2013-06-23 ENCOUNTER — Encounter: Payer: Medicare Other | Admitting: Cardiology

## 2013-06-23 ENCOUNTER — Ambulatory Visit (INDEPENDENT_AMBULATORY_CARE_PROVIDER_SITE_OTHER): Payer: Medicare Other | Admitting: Cardiology

## 2013-06-23 VITALS — BP 169/66 | HR 65 | Ht 62.0 in | Wt 151.0 lb

## 2013-06-23 DIAGNOSIS — E782 Mixed hyperlipidemia: Secondary | ICD-10-CM

## 2013-06-23 DIAGNOSIS — I2510 Atherosclerotic heart disease of native coronary artery without angina pectoris: Secondary | ICD-10-CM

## 2013-06-23 DIAGNOSIS — I10 Essential (primary) hypertension: Secondary | ICD-10-CM

## 2013-06-23 NOTE — Assessment & Plan Note (Signed)
Bood pressure elevated today. I asked her to keep an eye on this with Dr. Reuel Boom. She does have room for further titration, increasing Norvasc would be a reasonable next step.

## 2013-06-23 NOTE — Patient Instructions (Signed)
Your physician recommends that you schedule a follow-up appointment in: 6 MONTHS WITH DR MCDOWELL You will receive a reminder letter two months in advance reminding you to call and schedule your appointment. If you don't receive this letter, please contact our office.  Your physician recommends that you continue on your current medications as directed. Please refer to the Current Medication list given to you today.

## 2013-06-23 NOTE — Assessment & Plan Note (Signed)
Symptomatically stable status post DES to the LAD in May of this year. Continue current management. Followup arranged in 6 months.

## 2013-06-23 NOTE — Assessment & Plan Note (Signed)
She continues on Zocor, keep followup with Dr. Daniel. 

## 2013-06-23 NOTE — Progress Notes (Signed)
Error   This encounter was created in error - please disregard. 

## 2013-06-23 NOTE — Progress Notes (Signed)
Clinical Summary [name] is a 62 y.o.female last seen in September. She continues to do well, no recurring angina symptoms or unusual shortness of breath. She had a busy Thanksgiving, was fatigued doing all the cooking and cleaning, but otherwise states that she has been feeling better. She reports compliance with her medications, no bleeding problems on DAPT.   We reviewed her medications. Her blood pressure was elevated today. She states this is usually not the case. She does have a visit with Dr. Reuel Boom pending for next month. I have asked her to keep an on her blood pressure. She does have room for titration of her current medications.   Allergies  Allergen Reactions  . Erythromycin Base Diarrhea  . Azithromycin Other (See Comments)    Upset stomach   . Remeron [Mirtazapine]     Makes her too sleepy  . Zolpidem Tartrate     amnesia  . Sulfonamide Derivatives Rash    Mouth sores    Current Outpatient Prescriptions  Medication Sig Dispense Refill  . ACCU-CHEK SMARTVIEW test strip       . acetaminophen (TYLENOL) 500 MG tablet Take 500 mg by mouth every 6 (six) hours as needed for pain.       Marland Kitchen albuterol (PROVENTIL HFA;VENTOLIN HFA) 108 (90 BASE) MCG/ACT inhaler Inhale 2 puffs into the lungs every 6 (six) hours as needed for wheezing.      Marland Kitchen ALPRAZolam (XANAX) 1 MG tablet Take 1 mg by mouth at bedtime as needed for sleep.      Marland Kitchen amLODipine (NORVASC) 2.5 MG tablet Take 1 tablet (2.5 mg total) by mouth daily.  90 tablet  1  . Armodafinil (NUVIGIL) 250 MG tablet Take 250 mg by mouth daily as needed (to stay awake).      Marland Kitchen aspirin EC 81 MG tablet Take 81 mg by mouth daily.      Marland Kitchen BIOTIN PO Take 1 tablet by mouth every morning.      . Blood Glucose Monitoring Suppl (ACCU-CHEK NANO SMARTVIEW) W/DEVICE KIT Use as directed      . clopidogrel (PLAVIX) 75 MG tablet Take 1 tablet (75 mg total) by mouth daily with breakfast.  90 tablet  1  . DULoxetine (CYMBALTA) 60 MG capsule       .  fluticasone (FLONASE) 50 MCG/ACT nasal spray Place 2 sprays into the nose at bedtime.      . gabapentin (NEURONTIN) 300 MG capsule Take 300 mg by mouth daily as needed (for nerve pain).      Marland Kitchen HYDROcodone-acetaminophen (NORCO/VICODIN) 5-325 MG per tablet Take 1 tablet by mouth every 6 (six) hours as needed for pain. Mostly takes at bedtime, but may take prn for nerve pain.      Marland Kitchen insulin glargine (LANTUS) 100 UNIT/ML injection Inject 60 Units into the skin at bedtime.       . Lactobacillus (ULTIMATE PROBIOTIC FORMULA) CAPS 1 capsule daily of OTC probiotic per pharmacy recommendation  20 capsule  0  . lidocaine (LIDODERM) 5 % Place 3 patches onto the skin daily as needed. Remove & Discard patch within 12 hours or as directed by MD      . losartan-hydrochlorothiazide (HYZAAR) 100-25 MG per tablet Take 1 tablet by mouth daily.        . metoprolol tartrate (LOPRESSOR) 25 MG tablet Take 1 tablet (25 mg total) by mouth 2 (two) times daily.  180 tablet  1  . montelukast (SINGULAIR) 10 MG tablet Take 10  mg by mouth daily.        Marland Kitchen NEXIUM 40 MG capsule Take 1 capsule by mouth daily as needed.       . nitroGLYCERIN (NITROSTAT) 0.4 MG SL tablet Place 1 tablet (0.4 mg total) under the tongue every 5 (five) minutes as needed for chest pain.  90 tablet  1  . simvastatin (ZOCOR) 40 MG tablet Take 40 mg by mouth at bedtime.       . traZODone (DESYREL) 50 MG tablet Take 200 mg by mouth at bedtime.       Marland Kitchen VITAMIN D, CHOLECALCIFEROL, PO Take 5,000 Units by mouth every morning.        No current facility-administered medications for this visit.    Past Medical History  Diagnosis Date  . Essential hypertension, benign   . Mixed hyperlipidemia   . GERD (gastroesophageal reflux disease)   . Anxiety   . Type 2 diabetes mellitus   . IBS (irritable bowel syndrome)   . Asthma   . Fibromyalgia   . Pancreatitis 2008  . Barrett esophagus   . Coronary atherosclerosis of native coronary artery     DES to LAD May  2014  . Pneumonia 1990's  . Chronic bronchitis   . OSA on CPAP   . H/O hiatal hernia   . Migraines   . Arthritis   . Chronic lower back pain   . Diverticulosis     Social History [name] reports that she quit smoking about 19 years ago. Her smoking use included Cigarettes. She has a 12 pack-year smoking history. She has never used smokeless tobacco. [name] reports that she does not drink alcohol.  Review of Systems No palpitations, dizziness, syncope. No claudication. No orthopnea or PND. Otherwise negative.  Physical Examination Filed Vitals:   06/23/13 1349  BP: 169/66  Pulse: 65   Filed Weights   06/23/13 1349  Weight: 151 lb (68.493 kg)    Appears comfortable at rest.  HEENT: Conjunctiva and lids normal, oropharynx clear.  Neck: Supple, no elevated JVP or carotid bruits, no thyromegaly.  Lungs: Diminished but clear to auscultation, nonlabored breathing at rest.  Cardiac: Regular rate and rhythm, no S3 or significant systolic murmur, no pericardial rub.  Abdomen: Soft, nontender, bowel sounds present, no guarding or rebound.  Extremities: No pitting edema, distal pulses 2+.  Skin: Warm and dry.  Musculoskeletal: No kyphosis.  Neuropsychiatric: Alert and oriented x3, affect grossly appropriate.   Problem List and Plan   Coronary atherosclerosis of native coronary artery Symptomatically stable status post DES to the LAD in May of this year. Continue current management. Followup arranged in 6 months.  Essential hypertension, benign Bood pressure elevated today. I asked her to keep an eye on this with Dr. Reuel Boom. She does have room for further titration, increasing Norvasc would be a reasonable next step.  Mixed hyperlipidemia She continues on Zocor, keep followup with Dr. Reuel Boom.    Jonelle Sidle, M.D., F.A.C.C.

## 2013-06-26 ENCOUNTER — Ambulatory Visit: Payer: Medicare Other | Admitting: Cardiology

## 2013-11-02 ENCOUNTER — Other Ambulatory Visit: Payer: Self-pay | Admitting: Cardiology

## 2013-11-03 ENCOUNTER — Other Ambulatory Visit: Payer: Self-pay | Admitting: Cardiology

## 2013-11-08 ENCOUNTER — Encounter: Payer: Self-pay | Admitting: Cardiology

## 2013-11-08 ENCOUNTER — Ambulatory Visit (INDEPENDENT_AMBULATORY_CARE_PROVIDER_SITE_OTHER): Payer: Medicare Other | Admitting: Cardiology

## 2013-11-08 VITALS — BP 180/64 | HR 92 | Ht 63.0 in | Wt 155.0 lb

## 2013-11-08 DIAGNOSIS — I2510 Atherosclerotic heart disease of native coronary artery without angina pectoris: Secondary | ICD-10-CM

## 2013-11-08 DIAGNOSIS — I25810 Atherosclerosis of coronary artery bypass graft(s) without angina pectoris: Secondary | ICD-10-CM

## 2013-11-08 DIAGNOSIS — E119 Type 2 diabetes mellitus without complications: Secondary | ICD-10-CM

## 2013-11-08 DIAGNOSIS — E782 Mixed hyperlipidemia: Secondary | ICD-10-CM

## 2013-11-08 DIAGNOSIS — I10 Essential (primary) hypertension: Secondary | ICD-10-CM

## 2013-11-08 MED ORDER — CLOPIDOGREL BISULFATE 75 MG PO TABS: ORAL_TABLET | ORAL | Status: DC

## 2013-11-08 MED ORDER — METOPROLOL TARTRATE 25 MG PO TABS: ORAL_TABLET | ORAL | Status: DC

## 2013-11-08 NOTE — Assessment & Plan Note (Signed)
Recent lipid numbers reviewed. She continues on Zocor. This has been followed by Dr. Quillian Quince.

## 2013-11-08 NOTE — Assessment & Plan Note (Signed)
Followed by Dr. Daniel 

## 2013-11-08 NOTE — Patient Instructions (Addendum)
Your physician recommends that you schedule a follow-up appointment in: 4 months with Dr Ferne Reus will receive a reminder letter two months in advance reminding you to call and schedule your appointment. If you don't receive this letter, please contact our office.  Your physician has requested that you have a carotid duplex. This test is an ultrasound of the carotid arteries in your neck. It looks at blood flow through these arteries that supply the brain with blood. Allow one hour for this exam. There are no restrictions or special instructions.  Please continue Plavix for one month then stop.  Increased Metoprolol 25 mg three times a day.

## 2013-11-08 NOTE — Assessment & Plan Note (Signed)
Blood pressure elevated today. Beta blocker is being increased. There is more room for adjustment of Norvasc if needed.

## 2013-11-08 NOTE — Progress Notes (Signed)
Clinical Summary [name] is a 63 y.o.female last seen in December 2014. She presents back to the office with some recent concerns about her medications. She continues to see Dr. Quillian Quince for primary care. Nexium was recommended for reflux symptoms, but she was concerned about taking this while being on Plavix. She had a DES to the LAD in May of last year, and will be able to stop her Plavix at the end of this May, but continue on aspirin.  She has not had any angina symptoms. She does report palpitations. No syncope. ECG today shows sinus rhythm with frequent PVCs. She reports compliance with her beta blocker. Blood pressure was elevated today.  She continues on Zocor. Recent lab work shows cholesterol 193, triglycerides 168, HDL 40, LDL 111, normal LFTs.   Allergies  Allergen Reactions  . Erythromycin Base Diarrhea  . Azithromycin Other (See Comments)    Upset stomach   . Remeron [Mirtazapine]     Makes her too sleepy  . Zolpidem Tartrate     amnesia  . Sulfonamide Derivatives Rash    Mouth sores    Current Outpatient Prescriptions  Medication Sig Dispense Refill  . ACCU-CHEK SMARTVIEW test strip       . acetaminophen (TYLENOL) 500 MG tablet Take 500 mg by mouth every 6 (six) hours as needed for pain.       Marland Kitchen albuterol (PROVENTIL HFA;VENTOLIN HFA) 108 (90 BASE) MCG/ACT inhaler Inhale 2 puffs into the lungs every 6 (six) hours as needed for wheezing.      Marland Kitchen ALPRAZolam (XANAX) 1 MG tablet Take 1 mg by mouth at bedtime as needed for sleep.      Marland Kitchen amLODipine (NORVASC) 2.5 MG tablet Take 1 tablet (2.5 mg total) by mouth daily.  90 tablet  1  . aspirin EC 81 MG tablet Take 81 mg by mouth daily.      Marland Kitchen BIOTIN PO Take 1 tablet by mouth every morning.      . Blood Glucose Monitoring Suppl (ACCU-CHEK NANO SMARTVIEW) W/DEVICE KIT Use as directed      . clopidogrel (PLAVIX) 75 MG tablet TAKE ONE TABLET BY MOUTH ONCE DAILY WITH BREAKFAST  30 tablet  0  . DULoxetine (CYMBALTA) 60 MG capsule  Take 60 mg by mouth daily.       . fluticasone (FLONASE) 50 MCG/ACT nasal spray Place 2 sprays into the nose at bedtime.      Marland Kitchen HYDROcodone-acetaminophen (NORCO/VICODIN) 5-325 MG per tablet Take 1 tablet by mouth every 6 (six) hours as needed for pain. Mostly takes at bedtime, but may take prn for nerve pain.      Marland Kitchen insulin glargine (LANTUS) 100 UNIT/ML injection Inject 60 Units into the skin at bedtime.       . Lactobacillus (ULTIMATE PROBIOTIC FORMULA) CAPS 1 capsule daily of OTC probiotic per pharmacy recommendation  20 capsule  0  . lidocaine (LIDODERM) 5 % Place 3 patches onto the skin daily as needed. Remove & Discard patch within 12 hours or as directed by MD      . losartan-hydrochlorothiazide (HYZAAR) 100-25 MG per tablet Take 1 tablet by mouth daily.        . metoprolol tartrate (LOPRESSOR) 25 MG tablet TAKE ONE TABLET BY MOUTH THREE TIMES A DAY  270 tablet  2  . montelukast (SINGULAIR) 10 MG tablet Take 10 mg by mouth daily.        . nitroGLYCERIN (NITROSTAT) 0.4 MG SL tablet Place 1  tablet (0.4 mg total) under the tongue every 5 (five) minutes as needed for chest pain.  90 tablet  1  . simvastatin (ZOCOR) 40 MG tablet Take 40 mg by mouth at bedtime.       . traZODone (DESYREL) 50 MG tablet Take 200 mg by mouth at bedtime.       Marland Kitchen VITAMIN D, CHOLECALCIFEROL, PO Take 5,000 Units by mouth every morning.        No current facility-administered medications for this visit.    Past Medical History  Diagnosis Date  . Essential hypertension, benign   . Mixed hyperlipidemia   . GERD (gastroesophageal reflux disease)   . Anxiety   . Type 2 diabetes mellitus   . IBS (irritable bowel syndrome)   . Asthma   . Fibromyalgia   . Pancreatitis 2008  . Barrett esophagus   . Coronary atherosclerosis of native coronary artery     DES to LAD May 2014  . Pneumonia 1990's  . Chronic bronchitis   . OSA on CPAP   . H/O hiatal hernia   . Migraines   . Arthritis   . Chronic lower back pain   .  Diverticulosis     Social History Ms. Benningfield reports that she quit smoking about 20 years ago. Her smoking use included Cigarettes. She has a 12 pack-year smoking history. She has never used smokeless tobacco. Ms. Titterington reports that she does not drink alcohol.  Review of Systems Occasional anxiety and emotional upset. Stable appetite. Was having some nausea and abdominal pain. This seems to be getting better. Tries to walk outdoors for exercise. Otherwise negative.  Physical Examination Filed Vitals:   11/08/13 1358  BP: 180/64  Pulse: 92   Filed Weights   11/08/13 1358  Weight: 155 lb (70.308 kg)    Appears comfortable at rest.  HEENT: Conjunctiva and lids normal, oropharynx clear.  Neck: Supple, no elevated JVP or carotid bruits, no thyromegaly.  Lungs: Diminished but clear to auscultation, nonlabored breathing at rest.  Cardiac: Regular rate and rhythm with occasional ectopy, no S3 or significant systolic murmur, no pericardial rub.  Abdomen: Soft, nontender, bowel sounds present, no guarding or rebound.  Extremities: No pitting edema, distal pulses 2+.  Skin: Warm and dry.  Musculoskeletal: No kyphosis.  Neuropsychiatric: Alert and oriented x3, affect grossly appropriate.   Problem List and Plan   Coronary atherosclerosis of native coronary artery Continue current regimen except to increase Lopressor to 25 mg 3 times a day. She will continue Plavix through May and then simplify to aspirin alone. Plan followup in 4 months.  Mixed hyperlipidemia Recent lipid numbers reviewed. She continues on Zocor. This has been followed by Dr. Quillian Quince.  Essential hypertension, benign Blood pressure elevated today. Beta blocker is being increased. There is more room for adjustment of Norvasc if needed.  Type 2 diabetes mellitus Followed by Dr. Quillian Quince.    [name], M.D., F.A.C.C.

## 2013-11-08 NOTE — Assessment & Plan Note (Signed)
Continue current regimen except to increase Lopressor to 25 mg 3 times a day. She will continue Plavix through May and then simplify to aspirin alone. Plan followup in 4 months.

## 2013-11-09 ENCOUNTER — Encounter: Payer: Self-pay | Admitting: Cardiology

## 2013-11-10 ENCOUNTER — Ambulatory Visit (HOSPITAL_COMMUNITY)
Admission: RE | Admit: 2013-11-10 | Discharge: 2013-11-10 | Disposition: A | Discharge status: Home / Self Care | Payer: Medicare Other | Source: Ambulatory Visit | Attending: Cardiology | Admitting: Cardiology

## 2013-11-10 DIAGNOSIS — I6529 Occlusion and stenosis of unspecified carotid artery: Secondary | ICD-10-CM | POA: Insufficient documentation

## 2013-11-10 DIAGNOSIS — I2510 Atherosclerotic heart disease of native coronary artery without angina pectoris: Secondary | ICD-10-CM | POA: Insufficient documentation

## 2013-11-10 DIAGNOSIS — I658 Occlusion and stenosis of other precerebral arteries: Secondary | ICD-10-CM | POA: Insufficient documentation

## 2013-11-10 DIAGNOSIS — I25810 Atherosclerosis of coronary artery bypass graft(s) without angina pectoris: Secondary | ICD-10-CM

## 2013-11-16 ENCOUNTER — Telehealth: Payer: Self-pay | Admitting: Cardiology

## 2013-11-16 NOTE — Telephone Encounter (Signed)
Patient would like to speak with nurse regarding symptoms / tgs

## 2013-11-16 NOTE — Telephone Encounter (Signed)
Despite increase in Metoprolol 25 mg tid , pt still experiencing palpitations and elevated blood pressure (193/109)  Please advise

## 2013-11-16 NOTE — Telephone Encounter (Signed)
Would recommend increasing metoprolol to 50 mg twice a day. If this is not effective, Norvasc could be further advanced to 5 mg daily. Would not make these changes at once however.

## 2013-11-16 NOTE — Telephone Encounter (Signed)
I spoke with patient, she will increase her lopressor to 50 mg bid.I spoke with pharmacist "Marjory Lies" at [facility] and have ordered medication for pt. Ms.Mednick will call back in one week and report her BP readings and give [place] a status check on severity of palpitations.

## 2013-11-27 ENCOUNTER — Telehealth: Payer: Self-pay

## 2013-11-27 MED ORDER — AMLODIPINE BESYLATE 5 MG PO TABS: 5.0000 mg | ORAL_TABLET | Freq: Every day | ORAL | Status: DC

## 2013-11-27 NOTE — Telephone Encounter (Signed)
Pt notified,Norvasc increased to 5 mg, she is taking lopressor 50 mg bid already   Will call pt in 1 week for status check

## 2013-11-27 NOTE — Telephone Encounter (Signed)
Pt seen 11/08/13 Lopressor increased to 25 mg tid,pt still has BP's running 160/98 - 169/109  HR 66-75  Continues with palpitations    Please advise

## 2013-11-27 NOTE — Telephone Encounter (Signed)
According to last telephone note, Lopressor has already been increased to 50 mg twice daily. Would increase Norvasc to 5 mg daily next, previously 2.5 mg daily.

## 2013-12-08 NOTE — Telephone Encounter (Signed)
Pts average BP on Norvasc 5 mg is 146/76, HR in 60's although she still has a lot of palpitations.She was supposed to finish Plavix  and is afraid to stop immediately.States she has read she could have a stroke in the first ninty days off plavix.She wonders if we could wean her off instead

## 2013-12-27 ENCOUNTER — Telehealth: Payer: Self-pay | Admitting: Cardiology

## 2013-12-28 ENCOUNTER — Other Ambulatory Visit: Payer: Self-pay

## 2013-12-28 ENCOUNTER — Telehealth: Payer: Self-pay | Admitting: Cardiology

## 2013-12-28 MED ORDER — AMLODIPINE BESYLATE 5 MG PO TABS: 7.5000 mg | ORAL_TABLET | Freq: Every day | ORAL | Status: DC

## 2013-12-28 NOTE — Telephone Encounter (Signed)
i will call pt when finished with clinic,pt made aware and agrees with dispo

## 2013-12-28 NOTE — Telephone Encounter (Signed)
Patient would like to speak with Tye Maryland regarding new medicine that Dr.McDowell put her onl. / tgs

## 2013-12-28 NOTE — Telephone Encounter (Signed)
Should probably try and increase Norvasc dose further. If feasible, would try to go to Norvasc 7.5 mg daily next. We can even go higher if needed.

## 2013-12-28 NOTE — Telephone Encounter (Signed)
I spoke with patient,she will increase Norvasc 7.5 mg daily and call me back next week with BP update.

## 2013-12-28 NOTE — Telephone Encounter (Signed)
Pt's Lopressor was increased to 50 mg bid and Norvasc was increased to 5 mg daily on 11/27/13 . Her palpitations are somewhat better,however, average BP is running 159/85.  She has been treated for the last three weeks for wheezing with antibiotics and prednisone by pcp and is no better.Has fu apt with pcp on 01/01/14

## 2014-03-20 IMAGING — CR DG CHEST 2V
2 series · 2 of 2 positions shown · non-contrast
Comparison: 12/05/2012

CLINICAL DATA: Chest pain, shortness of breath, headache.

CHEST - 2 VIEW

[w chest pa]
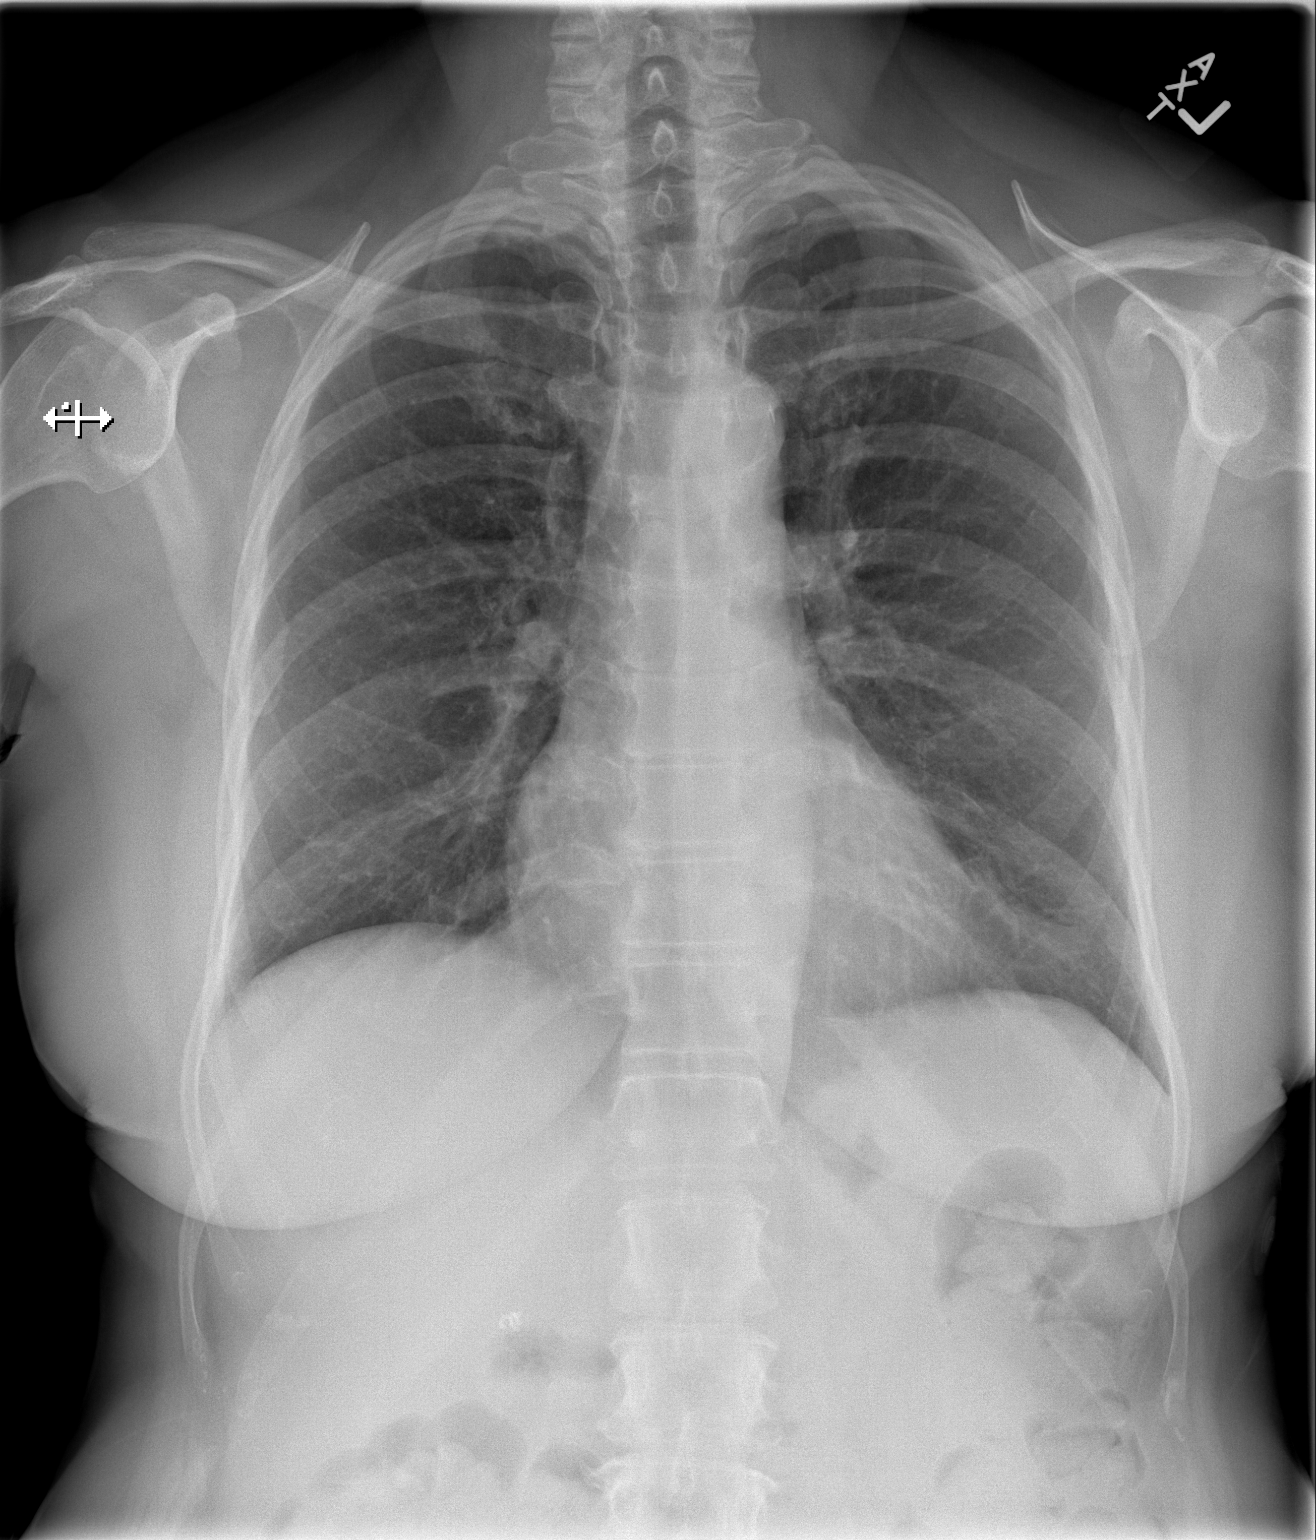

[w chest lat]
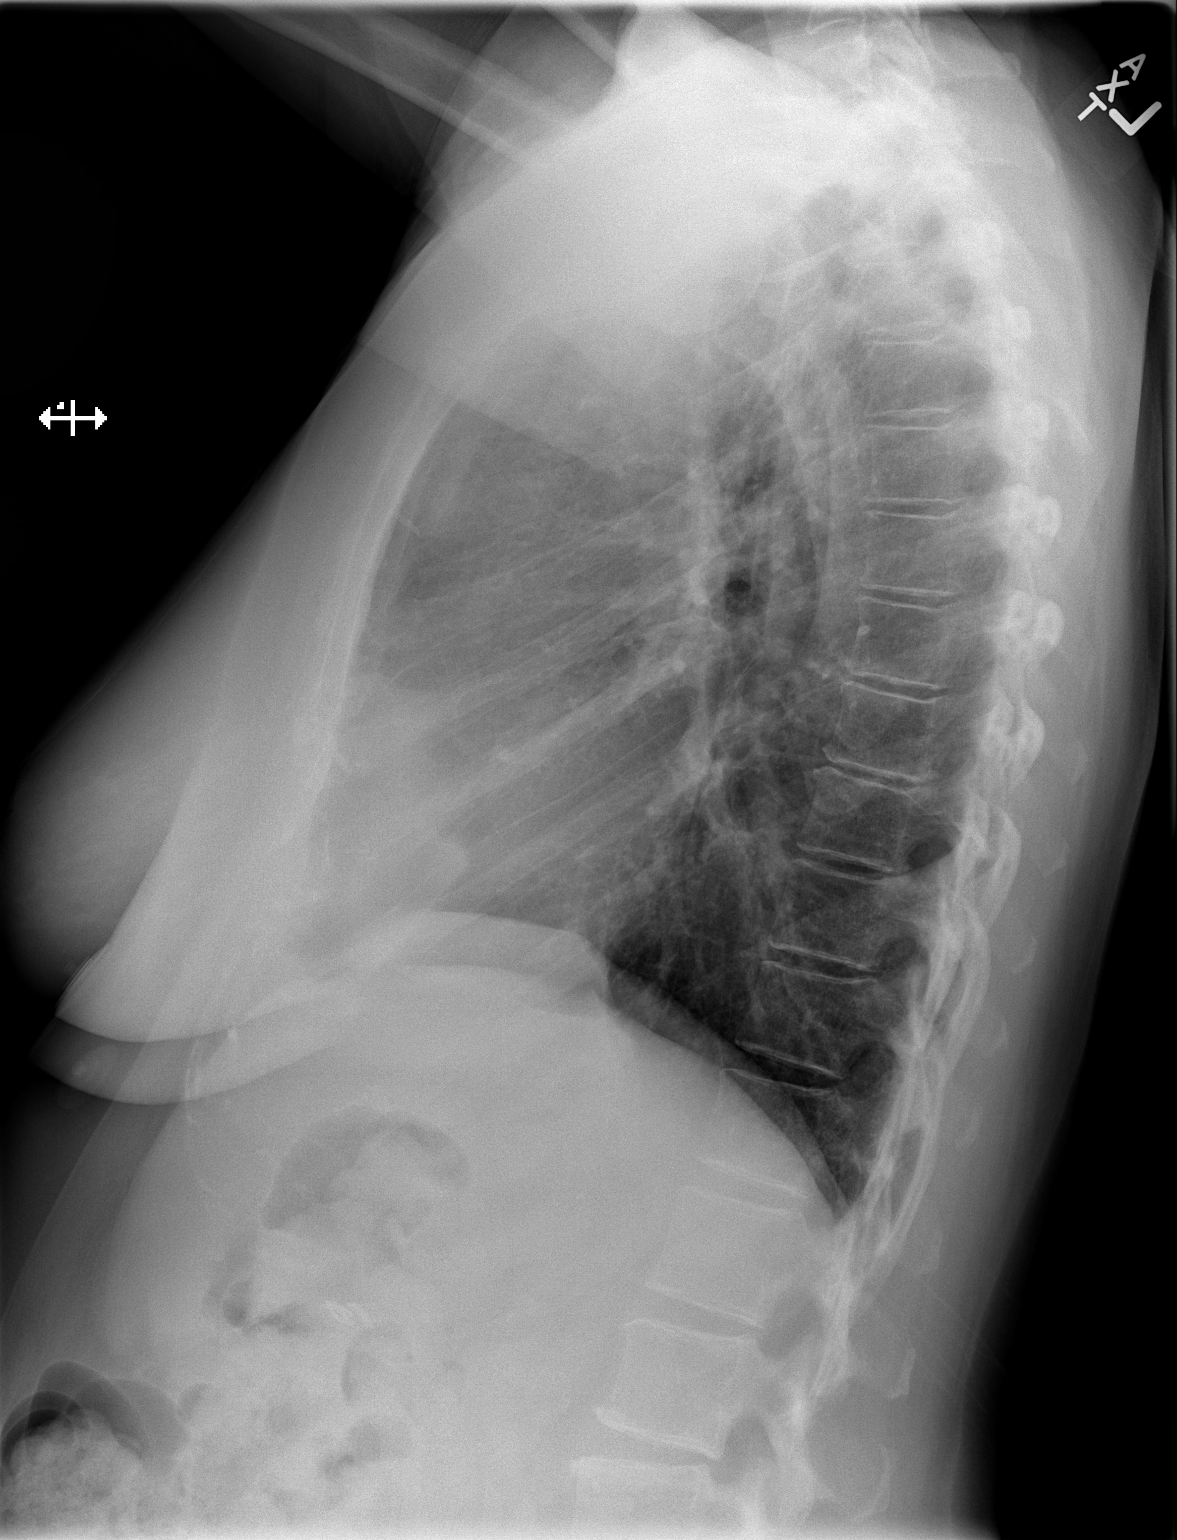

[2 of 2 positions shown; findings below may reference images not displayed]

FINDINGS: Heart and mediastinal contours are within normal limits.
No focal opacities or effusions.  No acute bony abnormality.
IMPRESSION: No active cardiopulmonary disease.

## 2014-04-23 ENCOUNTER — Telehealth: Payer: Self-pay | Admitting: Pulmonary Disease

## 2014-04-23 ENCOUNTER — Ambulatory Visit: Payer: Medicare Other | Admitting: Pulmonary Disease

## 2014-04-23 NOTE — Telephone Encounter (Signed)
Rec'd from [facility] forward 15 pages to Dr. Gwenette Greet

## 2014-04-23 NOTE — Telephone Encounter (Signed)
Rec'd from [facility] forward 15 pages to [facility]

## 2014-04-23 NOTE — Telephone Encounter (Signed)
Rec'd from [facility] forward to Dr. Gwenette Greet

## 2014-04-24 ENCOUNTER — Telehealth: Payer: Self-pay

## 2014-04-24 ENCOUNTER — Encounter: Payer: Self-pay | Admitting: Cardiology

## 2014-04-24 ENCOUNTER — Ambulatory Visit (INDEPENDENT_AMBULATORY_CARE_PROVIDER_SITE_OTHER): Payer: Medicare Other | Admitting: Cardiology

## 2014-04-24 VITALS — BP 140/68 | HR 58 | Ht 63.0 in | Wt 155.0 lb

## 2014-04-24 DIAGNOSIS — I25119 Atherosclerotic heart disease of native coronary artery with unspecified angina pectoris: Secondary | ICD-10-CM

## 2014-04-24 DIAGNOSIS — I10 Essential (primary) hypertension: Secondary | ICD-10-CM

## 2014-04-24 DIAGNOSIS — E1159 Type 2 diabetes mellitus with other circulatory complications: Secondary | ICD-10-CM

## 2014-04-24 DIAGNOSIS — E782 Mixed hyperlipidemia: Secondary | ICD-10-CM

## 2014-04-24 MED ORDER — EZETIMIBE 10 MG PO TABS: 10.0000 mg | ORAL_TABLET | Freq: Every day | ORAL | Status: DC

## 2014-04-24 MED ORDER — AMLODIPINE BESYLATE 5 MG PO TABS: 7.5000 mg | ORAL_TABLET | Freq: Every day | ORAL | Status: DC

## 2014-04-24 NOTE — Progress Notes (Signed)
Clinical Summary [name] is a 63 y.o.female last seen in April. Medications have been uptitrated since that time for better blood pressure control. She continues to follow with Dr. Quillian Quince. She has come off Plavix as of May, continues on aspirin. I note that she was also taken off of Zocor given complaints of achiness, symptoms have improved somewhat although not resolved. She was willing to try Zetia having had previous problems with Lipitor, Crestor, and Pravachol by her recollection.  Recent lab work per Dr. Quillian Quince showed hemoglobin A1c 7.3, BUN 9, creatinine 0.7, potassium 4.1, normal AST and ALT, cholesterol 245, triglycerides 241, HDL 36, LDL 181.  Dr. Quillian Quince has referred her for GI evaluation in light of progressive reflux symptoms as well as apparent reflux aspiration. Workup is pending. No specific cardiac reason to expect that she could not proceed with EGD or other endoscopic evaluation if needed.   Allergies  Allergen Reactions  . Erythromycin Base Diarrhea  . Azithromycin Other (See Comments)    Upset stomach   . Remeron [Mirtazapine]     Makes her too sleepy  . Zolpidem Tartrate     amnesia  . Sulfonamide Derivatives Rash    Mouth sores    Current Outpatient Prescriptions  Medication Sig Dispense Refill  . ACCU-CHEK SMARTVIEW test strip       . acetaminophen (TYLENOL) 500 MG tablet Take 500 mg by mouth every 6 (six) hours as needed for pain.       Marland Kitchen albuterol (PROVENTIL HFA;VENTOLIN HFA) 108 (90 BASE) MCG/ACT inhaler Inhale 2 puffs into the lungs every 6 (six) hours as needed for wheezing.      Marland Kitchen ALPRAZolam (XANAX) 1 MG tablet Take 1 mg by mouth at bedtime as needed for sleep.      Marland Kitchen amLODipine (NORVASC) 5 MG tablet Take 1.5 tablets (7.5 mg total) by mouth daily.  135 tablet  3  . aspirin EC 81 MG tablet Take 81 mg by mouth daily.      Marland Kitchen BIOTIN PO Take 1 tablet by mouth every morning.      . Blood Glucose Monitoring Suppl (ACCU-CHEK NANO SMARTVIEW) W/DEVICE KIT Use  as directed      . DULoxetine (CYMBALTA) 60 MG capsule Take 60 mg by mouth daily.       . fluticasone (FLONASE) 50 MCG/ACT nasal spray Place 2 sprays into the nose at bedtime.      Marland Kitchen HYDROcodone-acetaminophen (NORCO/VICODIN) 5-325 MG per tablet Take 1 tablet by mouth every 6 (six) hours as needed for pain. Mostly takes at bedtime, but may take prn for nerve pain.      Marland Kitchen insulin glargine (LANTUS) 100 UNIT/ML injection Inject 60 Units into the skin at bedtime.       . Lactobacillus (ULTIMATE PROBIOTIC FORMULA) CAPS 1 capsule daily of OTC probiotic per pharmacy recommendation  20 capsule  0  . lidocaine (LIDODERM) 5 % Place 3 patches onto the skin daily as needed. Remove & Discard patch within 12 hours or as directed by MD      . losartan-hydrochlorothiazide (HYZAAR) 100-25 MG per tablet Take 1 tablet by mouth daily.        . metoprolol (LOPRESSOR) 50 MG tablet Take 50 mg by mouth 2 (two) times daily.      . montelukast (SINGULAIR) 10 MG tablet Take 10 mg by mouth daily.        . nitroGLYCERIN (NITROSTAT) 0.4 MG SL tablet Place 1 tablet (0.4 mg total) under  the tongue every 5 (five) minutes as needed for chest pain.  90 tablet  1  . traZODone (DESYREL) 50 MG tablet Take 200 mg by mouth at bedtime.       Marland Kitchen VITAMIN D, CHOLECALCIFEROL, PO Take 5,000 Units by mouth every morning.       . ezetimibe (ZETIA) 10 MG tablet Take 1 tablet (10 mg total) by mouth daily.  90 tablet  3   No current facility-administered medications for this visit.    Past Medical History  Diagnosis Date  . Essential hypertension, benign   . Mixed hyperlipidemia   . GERD (gastroesophageal reflux disease)   . Anxiety   . Type 2 diabetes mellitus   . IBS (irritable bowel syndrome)   . Asthma   . Fibromyalgia   . Pancreatitis 2008  . Barrett esophagus   . Coronary atherosclerosis of native coronary artery     DES to LAD May 2014  . Pneumonia 1990's  . Chronic bronchitis   . OSA on CPAP   . H/O hiatal hernia   .  Migraines   . Arthritis   . Chronic lower back pain   . Diverticulosis     Social History Ms. Lesniewski reports that she quit smoking about 20 years ago. Her smoking use included Cigarettes. She has a 12 pack-year smoking history. She has never used smokeless tobacco. Ms. Devin reports that she does not drink alcohol.  Review of Systems No palpitations. Reports that blood pressure goes up when she gets upset. She tries to calm down, sometimes takes nitroglycerin. No other progressive angina symptoms however. Other systems reviewed and negative.  Physical Examination Filed Vitals:   04/24/14 1040  BP: 140/68  Pulse: 58   Filed Weights   04/24/14 1040  Weight: 155 lb (70.308 kg)    Appears comfortable at rest.  HEENT: Conjunctiva and lids normal, oropharynx clear.  Neck: Supple, no elevated JVP or carotid bruits, no thyromegaly.  Lungs: Diminished but clear to auscultation, nonlabored breathing at rest.  Cardiac: Regular rate and rhythm with occasional ectopy, no S3 or significant systolic murmur, no pericardial rub.  Abdomen: Soft, nontender, bowel sounds present, no guarding or rebound.  Extremities: No pitting edema, distal pulses 2+.  Skin: Warm and dry.  Musculoskeletal: No kyphosis.  Neuropsychiatric: Alert and oriented x3, affect grossly appropriate.   Problem List and Plan   Coronary atherosclerosis of native coronary artery Off Plavix as of May. Plan to continue aspirin, Lopressor, Norvasc, and as needed nitroglycerin. Followup arranged in the next 6 months.  Mixed hyperlipidemia History of statin intolerance including Zocor, Lipitor, Pravachol, and Crestor. We will try Zetia.  Essential hypertension, benign No change in current antihypertensive regimen. Keep followup with Dr. Quillian Quince.  Type 2 diabetes mellitus Recent hemoglobin A1c 7.3. Keep followup with Dr. Quillian Quince.    [name], M.D., F.A.C.C.

## 2014-04-24 NOTE — Assessment & Plan Note (Signed)
No change in current antihypertensive regimen. Keep followup with Dr. Quillian Quince.

## 2014-04-24 NOTE — Assessment & Plan Note (Signed)
History of statin intolerance including Zocor, Lipitor, Pravachol, and Crestor. We will try Zetia.

## 2014-04-24 NOTE — Patient Instructions (Addendum)
Your physician recommends that you schedule a follow-up appointment in: 6 months    Your physician has recommended you make the following change in your medication:      STOP Plavix, STOP Zocor   START Zetia 10 mg daily   I refilled your Novasc      Thank you for choosing [facility] !

## 2014-04-24 NOTE — Assessment & Plan Note (Signed)
Off Plavix as of May. Plan to continue aspirin, Lopressor, Norvasc, and as needed nitroglycerin. Followup arranged in the next 6 months.

## 2014-04-24 NOTE — Assessment & Plan Note (Signed)
Recent hemoglobin A1c 7.3. Keep followup with Dr. Quillian Quince.

## 2014-04-24 NOTE — Telephone Encounter (Signed)
Rec'd from [place] forward 15 pages to GI Historical Provider

## 2014-04-25 ENCOUNTER — Telehealth: Payer: Self-pay | Admitting: Gastroenterology

## 2014-04-25 ENCOUNTER — Encounter: Payer: Self-pay | Admitting: Cardiology

## 2014-04-26 ENCOUNTER — Telehealth: Payer: Self-pay | Admitting: Pulmonary Disease

## 2014-04-26 NOTE — Telephone Encounter (Signed)
Received 15 pages from [facility], sent to Dr. Gwenette Greet. 04/26/14/ss

## 2014-04-30 ENCOUNTER — Encounter: Payer: Self-pay | Admitting: Gastroenterology

## 2014-04-30 NOTE — Telephone Encounter (Signed)
Lm for pt to call.. Records approved, in file.

## 2014-05-02 ENCOUNTER — Emergency Department (HOSPITAL_COMMUNITY): Payer: No Typology Code available for payment source

## 2014-05-02 ENCOUNTER — Encounter (HOSPITAL_COMMUNITY): Payer: Self-pay | Admitting: Emergency Medicine

## 2014-05-02 ENCOUNTER — Emergency Department (HOSPITAL_COMMUNITY)
Admission: EM | Admit: 2014-05-02 | Discharge: 2014-05-02 | Disposition: A | Discharge status: Home / Self Care | Payer: No Typology Code available for payment source | Attending: Emergency Medicine | Admitting: Emergency Medicine

## 2014-05-02 DIAGNOSIS — S3991XA Unspecified injury of abdomen, initial encounter: Secondary | ICD-10-CM | POA: Diagnosis not present

## 2014-05-02 DIAGNOSIS — Z794 Long term (current) use of insulin: Secondary | ICD-10-CM | POA: Insufficient documentation

## 2014-05-02 DIAGNOSIS — J42 Unspecified chronic bronchitis: Secondary | ICD-10-CM | POA: Diagnosis not present

## 2014-05-02 DIAGNOSIS — S199XXA Unspecified injury of neck, initial encounter: Secondary | ICD-10-CM | POA: Diagnosis not present

## 2014-05-02 DIAGNOSIS — E782 Mixed hyperlipidemia: Secondary | ICD-10-CM | POA: Diagnosis not present

## 2014-05-02 DIAGNOSIS — Z7982 Long term (current) use of aspirin: Secondary | ICD-10-CM | POA: Insufficient documentation

## 2014-05-02 DIAGNOSIS — G8929 Other chronic pain: Secondary | ICD-10-CM | POA: Insufficient documentation

## 2014-05-02 DIAGNOSIS — Z79899 Other long term (current) drug therapy: Secondary | ICD-10-CM | POA: Diagnosis not present

## 2014-05-02 DIAGNOSIS — Y9389 Activity, other specified: Secondary | ICD-10-CM | POA: Diagnosis not present

## 2014-05-02 DIAGNOSIS — E119 Type 2 diabetes mellitus without complications: Secondary | ICD-10-CM | POA: Insufficient documentation

## 2014-05-02 DIAGNOSIS — I10 Essential (primary) hypertension: Secondary | ICD-10-CM | POA: Insufficient documentation

## 2014-05-02 DIAGNOSIS — S0990XA Unspecified injury of head, initial encounter: Secondary | ICD-10-CM | POA: Diagnosis present

## 2014-05-02 DIAGNOSIS — F419 Anxiety disorder, unspecified: Secondary | ICD-10-CM | POA: Insufficient documentation

## 2014-05-02 DIAGNOSIS — Z87891 Personal history of nicotine dependence: Secondary | ICD-10-CM | POA: Insufficient documentation

## 2014-05-02 DIAGNOSIS — Y92410 Unspecified street and highway as the place of occurrence of the external cause: Secondary | ICD-10-CM | POA: Insufficient documentation

## 2014-05-02 DIAGNOSIS — S0003XA Contusion of scalp, initial encounter: Secondary | ICD-10-CM | POA: Diagnosis not present

## 2014-05-02 DIAGNOSIS — G43909 Migraine, unspecified, not intractable, without status migrainosus: Secondary | ICD-10-CM | POA: Diagnosis not present

## 2014-05-02 DIAGNOSIS — S3992XA Unspecified injury of lower back, initial encounter: Secondary | ICD-10-CM | POA: Insufficient documentation

## 2014-05-02 DIAGNOSIS — I2510 Atherosclerotic heart disease of native coronary artery without angina pectoris: Secondary | ICD-10-CM | POA: Diagnosis not present

## 2014-05-02 DIAGNOSIS — Z9981 Dependence on supplemental oxygen: Secondary | ICD-10-CM | POA: Insufficient documentation

## 2014-05-02 DIAGNOSIS — Z8701 Personal history of pneumonia (recurrent): Secondary | ICD-10-CM | POA: Insufficient documentation

## 2014-05-02 DIAGNOSIS — Z8719 Personal history of other diseases of the digestive system: Secondary | ICD-10-CM | POA: Diagnosis not present

## 2014-05-02 DIAGNOSIS — G4733 Obstructive sleep apnea (adult) (pediatric): Secondary | ICD-10-CM | POA: Insufficient documentation

## 2014-05-02 DIAGNOSIS — J45909 Unspecified asthma, uncomplicated: Secondary | ICD-10-CM | POA: Diagnosis not present

## 2014-05-02 DIAGNOSIS — M1990 Unspecified osteoarthritis, unspecified site: Secondary | ICD-10-CM | POA: Diagnosis not present

## 2014-05-02 DIAGNOSIS — Z792 Long term (current) use of antibiotics: Secondary | ICD-10-CM | POA: Diagnosis not present

## 2014-05-02 LAB — CBC
HCT: 39.7 % (ref 36.0–46.0)
Hemoglobin: 13.6 g/dL (ref 12.0–15.0)
MCH: 32 pg (ref 26.0–34.0)
MCHC: 34.3 g/dL (ref 30.0–36.0)
MCV: 93.4 fL (ref 78.0–100.0)
Platelets: 330 10*3/uL (ref 150–400)
RBC: 4.25 MIL/uL (ref 3.87–5.11)
RDW: 12 % (ref 11.5–15.5)
WBC: 6.3 10*3/uL (ref 4.0–10.5)

## 2014-05-02 LAB — BASIC METABOLIC PANEL
Anion gap: 14 (ref 5–15)
BUN: 11 mg/dL (ref 6–23)
CHLORIDE: 103 meq/L (ref 96–112)
CO2: 25 mEq/L (ref 19–32)
Calcium: 9.4 mg/dL (ref 8.4–10.5)
Creatinine, Ser: 0.55 mg/dL (ref 0.50–1.10)
GFR calc Af Amer: >90 mL/min (ref >90)
GFR calc non Af Amer: >90 mL/min (ref >90)
GLUCOSE: 128 mg/dL — AB (ref 70–99)
POTASSIUM: 4.3 meq/L (ref 3.7–5.3)
Sodium: 142 mEq/L (ref 137–147)

## 2014-05-02 MED ORDER — SODIUM CHLORIDE 0.9 % IV BOLUS (SEPSIS)
1000.0000 mL | Freq: Once | INTRAVENOUS | Status: AC
  Administered 2014-05-02: 1000 mL via INTRAVENOUS

## 2014-05-02 MED ORDER — OXYCODONE-ACETAMINOPHEN 5-325 MG PO TABS: 1.0000 | ORAL_TABLET | Freq: Four times a day (QID) | ORAL | Status: DC | PRN

## 2014-05-02 MED ORDER — IOHEXOL 300 MG/ML  SOLN
100.0000 mL | Freq: Once | INTRAMUSCULAR | Status: AC | PRN
  Administered 2014-05-02: 100 mL via INTRAVENOUS

## 2014-05-02 MED ORDER — HYDROMORPHONE HCL 1 MG/ML IJ SOLN
1.0000 mg | Freq: Once | INTRAMUSCULAR | Status: AC
  Administered 2014-05-02: 1 mg via INTRAVENOUS
  Filled 2014-05-02: qty 1

## 2014-05-02 MED ORDER — OXYCODONE-ACETAMINOPHEN 5-325 MG PO TABS
2.0000 | ORAL_TABLET | Freq: Once | ORAL | Status: AC
  Administered 2014-05-02: 2 via ORAL
  Filled 2014-05-02: qty 2

## 2014-05-02 NOTE — ED Notes (Signed)
Patient transported to X-ray 
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
## 2014-05-02 NOTE — Discharge Instructions (Signed)
Contusion °A contusion is a deep bruise. Contusions are the result of an injury that caused bleeding under the skin. The contusion may turn blue, purple, or yellow. Minor injuries will give you a painless contusion, but more severe contusions may stay painful and swollen for a few weeks.  °CAUSES  °A contusion is usually caused by a blow, trauma, or direct force to an area of the body. °SYMPTOMS  °· Swelling and redness of the injured area. °· Bruising of the injured area. °· Tenderness and soreness of the injured area. °· Pain. °DIAGNOSIS  °The diagnosis can be made by taking a history and physical exam. An X-ray, CT scan, or MRI may be needed to determine if there were any associated injuries, such as fractures. °TREATMENT  °Specific treatment will depend on what area of the body was injured. In general, the best treatment for a contusion is resting, icing, elevating, and applying cold compresses to the injured area. Over-the-counter medicines may also be recommended for pain control. Ask your caregiver what the best treatment is for your contusion. °HOME CARE INSTRUCTIONS  °· Put ice on the injured area. °¨ Put ice in a plastic bag. °¨ Place a towel between your skin and the bag. °¨ Leave the ice on for 15-20 minutes, 3-4 times a day, or as directed by your health care provider. °· Only take over-the-counter or prescription medicines for pain, discomfort, or fever as directed by your caregiver. Your caregiver may recommend avoiding anti-inflammatory medicines (aspirin, ibuprofen, and naproxen) for 48 hours because these medicines may increase bruising. °· Rest the injured area. °· If possible, elevate the injured area to reduce swelling. °SEEK IMMEDIATE MEDICAL CARE IF:  °· You have increased bruising or swelling. °· You have pain that is getting worse. °· Your swelling or pain is not relieved with medicines. °MAKE SURE YOU:  °· Understand these instructions. °· Will watch your condition. °· Will get help right  away if you are not doing well or get worse. °Document Released: 04/15/2005 Document Revised: 07/11/2013 Document Reviewed: 05/11/2011 °ExitCare® Patient Information ©2015 [employer]. This information is not intended to replace advice given to you by your health care provider. Make sure you discuss any questions you have with your health care provider. ° °

## 2014-06-19 ENCOUNTER — Ambulatory Visit: Payer: Medicare Other | Admitting: Internal Medicine

## 2014-06-28 ENCOUNTER — Ambulatory Visit (INDEPENDENT_AMBULATORY_CARE_PROVIDER_SITE_OTHER): Payer: Medicare Other | Admitting: Physician Assistant

## 2014-06-28 ENCOUNTER — Ambulatory Visit: Payer: Medicare Other | Admitting: Gastroenterology

## 2014-06-28 ENCOUNTER — Encounter (HOSPITAL_COMMUNITY): Payer: Self-pay | Admitting: Cardiovascular Disease

## 2014-06-28 VITALS — BP 142/74 | HR 84 | Ht 60.63 in | Wt 157.0 lb

## 2014-06-28 DIAGNOSIS — K219 Gastro-esophageal reflux disease without esophagitis: Secondary | ICD-10-CM

## 2014-06-28 DIAGNOSIS — R1314 Dysphagia, pharyngoesophageal phase: Secondary | ICD-10-CM

## 2014-06-28 MED ORDER — ESOMEPRAZOLE MAGNESIUM 40 MG PO CPDR: 40.0000 mg | DELAYED_RELEASE_CAPSULE | Freq: Two times a day (BID) | ORAL | Status: DC

## 2014-06-28 NOTE — Patient Instructions (Addendum)
We sent a prescription for Nexium 40 mg, take 1 cap twice daily, before breakfast and dinner. We have given you antireflux information. You have been scheduled for a Barium Esophogram at [facility] (1st floor of the hospital) on 07-06-2014 at 11:30 am . Please arrive at 11:15 am  to your appointment for registration. Make certain not to have anything to eat or drink 3 hours prior to your test. If you need to reschedule for any reason, please contact radiology at [phone] to do so. __________________________________________________________________ A barium swallow is an examination that concentrates on views of the esophagus. This tends to be a double contrast exam (barium and two liquids which, when combined, create a gas to distend the wall of the oesophagus) or single contrast (non-ionic iodine based). The study is usually tailored to your symptoms so a good history is essential. Attention is paid during the study to the form, structure and configuration of the esophagus, looking for functional disorders (such as aspiration, dysphagia, achalasia, motility and reflux) EXAMINATION You may be asked to change into a gown, depending on the type of swallow being performed. A radiologist and radiographer will perform the procedure. The radiologist will advise you of the type of contrast selected for your procedure and direct you during the exam. You will be asked to stand, sit or lie in several different positions and to hold a small amount of fluid in your mouth before being asked to swallow while the imaging is performed .In some instances you may be asked to swallow barium coated marshmallows to assess the motility of a solid food bolus. The exam can be recorded as a digital or video fluoroscopy procedure. POST PROCEDURE It will take 1-2 days for the barium to pass through your system. To facilitate this, it is important, unless otherwise directed, to increase your fluids for the next 24-48hrs  and to resume your normal diet.  This test typically takes about 30 minutes to perform. __________________________________________________________________________________

## 2014-06-28 NOTE — Progress Notes (Signed)
Patient ID: [name], female   DOB: 08-16-1950, 63 y.o.   MRN: [id number]    HPI:    [name] is a 63 year old female referred for evaluation by Dr. Lauris Poag due to dysphagia.  [name] has a history of coronary artery disease and is status post a DES to the LAD in May 2014. She was on Plavix until late May 2015. She has a history of CAD, mixed hyperlipidemia, hypertension, diabetes mellitus, obstructive sleep apnea, fibromyalgia, I am disease, [facility] spotted fever, asthma, GERD, and rheumatoid arthritis. She has seen several other gastroenterologists in the past. She last had a colonoscopy at [facility] in October 2013 and was found to have a torturous rectosigmoid, moderate diverticulosis in the left colon, and 3 polyps. She had an EGD 05/03/2012 at [facility] and was noted to have mild gastritis. She had an endoscopy on 02/12/2011 at [facility] for dysphagia and was noted to have a normal appearing esophageal mucosa without evidence of stricture or ring. Stomach normal appearing gastric mucosa without evidence of gastritis or ulcer disease. Duodenum normal appearing duodenal mucosa. The esophagus was dilated to 19 mm using a savory dilator without complications. The patient states she had had esophageal dilations 1 or 2 times prior to that at other clinics. After her EGD at [facility] in 2012, she had no recurrence of her dysphagia until about a year ago when she again began to experience dysphagia to meats and dry foods. She states that several times a week she will have to spit food up because it will not go down. She occasionally has dysphagia to liquids. She also states that she has been having heartburn throughout the day. She has been on Nexium but takes it in the afternoon and does not take it on a daily basis. She has tried using a wedge under her mattress to elevate the head of her bed but continues to get mouthfuls of regurgitation,  and says she has aspirated and developed pneumonia on several occasions. More recently, she feels that she coughs and sputters when she eats or drinks and has had aspirations while eating as well.  She also has a history of irritable bowel syndrome and says that her stools for years alternate between formed and loose. She frequently has postprandial cramping relieved with defecation. She has lots of gas and bloating. Her appetite has been good. Her weight has been stable. She has had no anorexia or weight loss.   Past Medical History  Diagnosis Date  . Essential hypertension, benign   . Mixed hyperlipidemia   . GERD (gastroesophageal reflux disease)   . Anxiety   . Type 2 diabetes mellitus   . IBS (irritable bowel syndrome)   . Asthma   . Fibromyalgia   . Pancreatitis 2008  . Barrett esophagus   . Coronary atherosclerosis of native coronary artery     DES to LAD May 2014  . Pneumonia 1990's  . Chronic bronchitis   . OSA on CPAP   . H/O hiatal hernia   . Migraines   . Arthritis   . Chronic lower back pain   . Diverticulosis   . Major depression in partial remission     Past Surgical History  Procedure Laterality Date  . Toe fusion Right 10/11    First MTP joint  . Colonoscopy  10/13    Dr. Britta Mccreedy - tubular adenomas and diverticulosis  . Cholecystectomy  1990'S  . Appendectomy  ?1987  .  Abdominal hysterectomy  ?1987  . Carpal tunnel release Bilateral ?1990  . Cataract extraction w/ intraocular lens  implant, bilateral Bilateral ?2005  . Humerus fracture surgery Right 07/19/1969    "horst threw me" (12/09/2012)  . Bunionectomy Bilateral 1972  . Toe surgery Right 1990's    "toe next to big toe:  dr said I had tumor; cut bone & stuff; another OR dr stretched tendons, etc" (12/09/2012)  . Nasal septum surgery  1970's  . Dilation and curettage of uterus  1970's    "probably 3" (12/09/2012)  . Tubal ligation  1985  . Cesarean section  1985  . Ercp  2008  . Esophageal dilation   ` 2012  . Percutaneous coronary stent intervention (pci-s) N/A 12/09/2012    Procedure: PERCUTANEOUS CORONARY STENT INTERVENTION (PCI-S);  Surgeon: Burnell Blanks, MD;  Location: [facility] CATH LAB;  Service: Cardiovascular;  Laterality: N/A;   Family History  Problem Relation Age of Onset  . Stroke Mother   . Cirrhosis Father   . Hypertension Father   . Esophageal cancer Sister    History  Substance Use Topics  . Smoking status: Former Smoker -- 0.80 packs/day for 15 years    Types: Cigarettes    Quit date: 07/20/1993  . Smokeless tobacco: Never Used  . Alcohol Use: No   Current Outpatient Prescriptions  Medication Sig Dispense Refill  . ACCU-CHEK SMARTVIEW test strip     . albuterol (PROVENTIL HFA;VENTOLIN HFA) 108 (90 BASE) MCG/ACT inhaler Inhale 1 puff into the lungs every 6 (six) hours as needed for wheezing or shortness of breath.    . ALPRAZolam (XANAX) 1 MG tablet Take 1 mg by mouth at bedtime as needed for anxiety.    Marland Kitchen amLODipine (NORVASC) 5 MG tablet Take 7.5 mg by mouth daily.    Marland Kitchen aspirin EC 81 MG tablet Take 81 mg by mouth daily.    Marland Kitchen BIOTIN PO Take 1 tablet by mouth every morning.    . Blood Glucose Monitoring Suppl (ACCU-CHEK NANO SMARTVIEW) W/DEVICE KIT Use as directed    . Cyanocobalamin (VITAMIN B-12) 2500 MCG SUBL Place 1 tablet under the tongue daily.    Marland Kitchen docusate sodium (COLACE) 100 MG capsule Take 100 mg by mouth 2 (two) times daily.    . DULoxetine (CYMBALTA) 60 MG capsule Take 60 mg by mouth at bedtime.    Marland Kitchen ezetimibe (ZETIA) 10 MG tablet Take 1 tablet (10 mg total) by mouth daily. 90 tablet 3  . HYDROcodone-acetaminophen (NORCO/VICODIN) 5-325 MG per tablet Take 1 tablet by mouth at bedtime as needed for moderate pain.    Marland Kitchen insulin glargine (LANTUS) 100 UNIT/ML injection Inject 70 Units into the skin at bedtime.    . lidocaine (LIDODERM) 5 % Place 3 patches onto the skin daily as needed (for pain). Remove & Discard patch within 12 hours or as directed by  MD    . loratadine (CLARITIN) 10 MG tablet Take 10 mg by mouth daily.    Marland Kitchen losartan-hydrochlorothiazide (HYZAAR) 100-25 MG per tablet Take 1 tablet by mouth daily.    . metoprolol (LOPRESSOR) 50 MG tablet Take 50 mg by mouth 2 (two) times daily.    . montelukast (SINGULAIR) 10 MG tablet Take 10 mg by mouth at bedtime.    . nitroGLYCERIN (NITROSTAT) 0.4 MG SL tablet Place 1 tablet (0.4 mg total) under the tongue every 5 (five) minutes as needed for chest pain. 90 tablet 1  . traZODone (DESYREL) 50 MG tablet  Take 200 mg by mouth at bedtime as needed for sleep.    Marland Kitchen VITAMIN D, CHOLECALCIFEROL, PO Take 5,000 Units by mouth every morning.     Marland Kitchen esomeprazole (NEXIUM) 40 MG capsule Take 1 capsule (40 mg total) by mouth 2 (two) times daily before a meal. 60 capsule 6   No current facility-administered medications for this visit.   Allergies  Allergen Reactions  . Erythromycin Base Diarrhea  . Azithromycin Other (See Comments)    Upset stomach   . Remeron [Mirtazapine] Other (See Comments)    Makes her too sleepy  . Zolpidem Tartrate Other (See Comments)    amnesia  . Sulfonamide Derivatives Rash    Mouth sores     Review of Systems: Gen: Denies any fever, chills, sweats, anorexia, fatigue, weakness, malaise, weight loss, and sleep disorder CV: Denies chest pain, angina, palpitations, syncope, orthopnea, PND, peripheral edema, and claudication. Resp: Denies dyspnea at rest, dyspnea with exercise, cough, sputum, wheezing, coughing up blood, and pleurisy. GI: Denies vomiting blood, jaundice, and fecal incontinence.  Has dysphagia to meats and dry foods. GU : Denies urinary burning, blood in urine, urinary frequency, urinary hesitancy, nocturnal urination, and urinary incontinence. MS: Denies joint pain, limitation of movement, and swelling, stiffness, low back pain, extremity pain. Denies muscle weakness, cramps, atrophy.  Derm: Denies rash, itching, dry skin, hives, moles, warts, or unhealing  ulcers.  Psych: Denies depression, anxiety, memory loss, suicidal ideation, hallucinations, paranoia, and confusion. Heme: Denies bruising, bleeding, and enlarged lymph nodes. Neuro:  Denies any headaches, dizziness, paresthesias. Endo:  Denies any problems with DM, thyroid, adrenal function    Prior Endoscopies:   See history of present illness  Physical Exam: BP 142/74 mmHg  Pulse 84  Ht 5' 0.63" (1.54 m)  Wt 157 lb (71.215 kg)  BMI 30.03 kg/m2 Constitutional: Pleasant,well-developed female in no acute distress. HEENT: Normocephalic and atraumatic. Conjunctivae are normal. No scleral icterus. Neck supple.  Cardiovascular: Normal rate, regular rhythm.  Pulmonary/chest: Effort normal and breath sounds normal. No wheezing, rales or rhonchi. Abdominal: Soft, nondistended, nontender. Bowel sounds active throughout. There are no masses palpable. No hepatomegaly. Extremities: no edema Lymphadenopathy: No cervical adenopathy noted. Neurological: Alert and oriented to person place and time. Skin: Skin is warm and dry. No rashes noted. Psychiatric: Normal mood and affect. Behavior is normal.  ASSESSMENT AND PLAN: #1. GERD and dysphagia. An antireflux regimen has been reviewed. The patient has been instructed to use her Nexium on a daily basis and she will try Nexium 40 mg twice a day, 30 minutes prior to breakfast and 30 minutes prior to supper. She will be scheduled for an esophagram with barium tablet. Pending the results, she will likely be scheduled for an EGD with dilation or possibly a swallowing study with speech pathology.  #2 IBS. This is been a long-standing problem for her she's been advised to adhere to a high-fiber low-fat diet.  Further recommendations will be made pending the findings of the above tests.    Hvozdovic, Phyllis Barley PA-C 06/28/2014, 4:17 PM

## 2014-06-29 NOTE — Progress Notes (Signed)
Reviewed and agree with management. Robert D. Kaplan, M.D., FACG  

## 2014-07-02 ENCOUNTER — Telehealth: Payer: Self-pay | Admitting: *Deleted

## 2014-07-02 DIAGNOSIS — K219 Gastro-esophageal reflux disease without esophagitis: Secondary | ICD-10-CM

## 2014-07-02 DIAGNOSIS — R1314 Dysphagia, pharyngoesophageal phase: Secondary | ICD-10-CM

## 2014-07-02 NOTE — Telephone Encounter (Signed)
RECEIVED VIA FAX FROM [facility] PRIOR AUTH NEEDED FOR PATIENTS NEXIUM  I CALLED [phone] [facility] AND SPOKE WITH Alizzon S. PER [facility] PRIOR AUTH NOT NEEDED, PATIENTS TRYING TO FILL TO SOON, CANNOT BE FILLED AGAIN UNTIL 07-25-2014. LMOM FOR PATIENT TO CALL BACK.

## 2014-07-06 ENCOUNTER — Ambulatory Visit (HOSPITAL_COMMUNITY)
Admission: RE | Admit: 2014-07-06 | Discharge: 2014-07-06 | Disposition: A | Discharge status: Home / Self Care | Payer: Medicare Other | Source: Ambulatory Visit | Attending: Physician Assistant | Admitting: Physician Assistant

## 2014-07-06 DIAGNOSIS — K219 Gastro-esophageal reflux disease without esophagitis: Secondary | ICD-10-CM

## 2014-07-06 DIAGNOSIS — R1314 Dysphagia, pharyngoesophageal phase: Secondary | ICD-10-CM

## 2014-07-10 ENCOUNTER — Other Ambulatory Visit: Payer: Self-pay

## 2014-07-10 DIAGNOSIS — T17308A Unspecified foreign body in larynx causing other injury, initial encounter: Secondary | ICD-10-CM

## 2014-08-01 ENCOUNTER — Ambulatory Visit (HOSPITAL_COMMUNITY): Admit: 2014-08-01 | Payer: Self-pay | Admitting: Gastroenterology

## 2014-08-01 ENCOUNTER — Encounter (HOSPITAL_COMMUNITY): Payer: Self-pay

## 2014-08-01 SURGERY — EGD (ESOPHAGOGASTRODUODENOSCOPY)
Anesthesia: Moderate Sedation

## 2014-08-07 ENCOUNTER — Encounter: Payer: Self-pay | Admitting: Gastroenterology

## 2014-08-08 ENCOUNTER — Ambulatory Visit (AMBULATORY_SURGERY_CENTER): Payer: Self-pay | Admitting: *Deleted

## 2014-08-08 VITALS — Ht 62.0 in | Wt 158.0 lb

## 2014-08-08 DIAGNOSIS — K219 Gastro-esophageal reflux disease without esophagitis: Secondary | ICD-10-CM

## 2014-08-08 NOTE — Progress Notes (Signed)
Patient denies any allergies to eggs, patient does not know about Soy, she states she does not eat soy. Patient denies any problems with sedation. Patient denies any oxygen use at home and does not take any diet/weight loss medications. EMMI education assisgned to patient on EGD, this was explained and instructions given to patient.

## 2014-08-27 ENCOUNTER — Encounter: Payer: Self-pay | Admitting: Gastroenterology

## 2014-08-27 ENCOUNTER — Ambulatory Visit (AMBULATORY_SURGERY_CENTER): Payer: 59 | Admitting: Gastroenterology

## 2014-08-27 VITALS — BP 146/51 | HR 55 | Temp 98.5°F | Resp 16 | Ht 62.0 in | Wt 158.0 lb

## 2014-08-27 DIAGNOSIS — K219 Gastro-esophageal reflux disease without esophagitis: Secondary | ICD-10-CM

## 2014-08-27 DIAGNOSIS — K222 Esophageal obstruction: Secondary | ICD-10-CM

## 2014-08-27 MED ORDER — SODIUM CHLORIDE 0.9 % IV SOLN: 500.0000 mL | INTRAVENOUS | Status: DC

## 2014-08-27 MED ORDER — OMEPRAZOLE-SODIUM BICARBONATE 40-1100 MG PO CAPS: 1.0000 | ORAL_CAPSULE | Freq: Every day | ORAL | Status: DC

## 2014-08-27 NOTE — Op Note (Signed)
[facility]  [address] [place], [address]   ENDOSCOPY PROCEDURE REPORT  PATIENT: Phyllis, Freeman  MR#: [id number] BIRTHDATE: 07-Jul-1951 , 39  yrs. old GENDER: female ENDOSCOPIST: Inda Castle, MD REFERRED BY: PROCEDURE DATE:  08/27/2014 PROCEDURE:  EGD, diagnostic and Maloney dilation of esophagus ASA CLASS:     Class II INDICATIONS:  dysphagia. MEDICATIONS: Monitored anesthesia care and Propofol 140 mg IV TOPICAL ANESTHETIC:  DESCRIPTION OF PROCEDURE: After the risks benefits and alternatives of the procedure were thoroughly explained, informed consent was obtained.  The LB GIF-H180 Loaner [id number] endoscope was introduced through the mouth and advanced to the second portion of the duodenum , Without limitations.  The instrument was slowly withdrawn as the mucosa was fully examined.    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  The stricture was easily traversable. The stricture was dilated using a 26mm (52Fr) Maloney dilator. Except for the findings listed the EGD was otherwise normal. Retroflexed views revealed no abnormalities.     The scope was then withdrawn from the patient and the procedure completed.  COMPLICATIONS: There were no immediate complications.  ENDOSCOPIC IMPRESSION: 1.   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   EGD was otherwise normal  RECOMMENDATIONS: Repeat dilation as necessary switch from Nexium to Zegerid at bedtime Gastric emptying scan  REPEAT EXAM:  eSigned:  Inda Castle, MD 08/27/2014 3:05 PM    CC: Gar Ponto, MD

## 2014-08-27 NOTE — Patient Instructions (Signed)
YOU HAD AN ENDOSCOPIC PROCEDURE TODAY AT [facility]: Refer to the procedure report that was given to you for any specific questions about what was found during the examination.  If the procedure report does not answer your questions, please call your gastroenterologist to clarify.  If you requested that your care partner not be given the details of your procedure findings, then the procedure report has been included in a sealed envelope for you to review at your convenience later.  YOU SHOULD EXPECT: Some feelings of bloating in the abdomen. Passage of more gas than usual.  Walking can help get rid of the air that was put into your GI tract during the procedure and reduce the bloating. If you had a lower endoscopy (such as a colonoscopy or flexible sigmoidoscopy) you may notice spotting of blood in your stool or on the toilet paper. If you underwent a bowel prep for your procedure, then you may not have a normal bowel movement for a few days.  DIET:  FOLLOW DILATION HANDOUT.  ACTIVITY: Your care partner should take you home directly after the procedure.  You should plan to take it easy, moving slowly for the rest of the day.  You can resume normal activity the day after the procedure however you should NOT DRIVE or use heavy machinery for 24 hours (because of the sedation medicines used during the test).    SYMPTOMS TO REPORT IMMEDIATELY: A gastroenterologist can be reached at any hour.  During normal business hours, 8:30 AM to 5:00 PM Monday through Friday, call [phone].  After hours and on weekends, please call the GI answering service at [phone] who will take a message and have the physician on call contact you.    Following upper endoscopy (EGD)  Vomiting of blood or coffee ground material  New chest pain or pain under the shoulder blades  Painful or persistently difficult swallowing  New shortness of breath  Fever of 100F or higher  Black, tarry-looking  stools  FOLLOW UP: If any biopsies were taken you will be contacted by phone or by letter within the next 1-3 weeks.  Call your gastroenterologist if you have not heard about the biopsies in 3 weeks.  Our staff will call the home number listed on your records the next business day following your procedure to check on you and address any questions or concerns that you may have at that time regarding the information given to you following your procedure. This is a courtesy call and so if there is no answer at the home number and we have not heard from you through the emergency physician on call, we will assume that you have returned to your regular daily activities without incident.  SIGNATURES/CONFIDENTIALITY: You and/or your care partner have signed paperwork which will be entered into your electronic medical record.  These signatures attest to the fact that that the information above on your After Visit Summary has been reviewed and is understood.  Full responsibility of the confidentiality of this discharge information lies with you and/or your care-partner.  Resume medication.

## 2014-08-27 NOTE — Progress Notes (Signed)
Called to room to assist during endoscopic procedure.  Patient ID and intended procedure confirmed with present staff. Received instructions for my participation in the procedure from the performing physician.  

## 2014-08-27 NOTE — Progress Notes (Signed)
Procedure ends, to recovery, report given and VSS. 

## 2014-08-28 ENCOUNTER — Telehealth: Payer: Self-pay | Admitting: *Deleted

## 2014-08-28 DIAGNOSIS — K219 Gastro-esophageal reflux disease without esophagitis: Secondary | ICD-10-CM

## 2014-08-28 LAB — GLUCOSE, CAPILLARY
GLUCOSE-CAPILLARY: 123 mg/dL — AB (ref 70–99)
Glucose-Capillary: 97 mg/dL (ref 70–99)

## 2014-08-28 NOTE — Telephone Encounter (Signed)
  Follow up Call-  Call back number 08/27/2014  Post procedure Call Back phone  # [phone]  Permission to leave phone message Yes     Patient questions:  Do you have a fever, pain , or abdominal swelling? No. Pain Score  0 *  Have you tolerated food without any problems? Yes.    Have you been able to return to your normal activities? Yes.    Do you have any questions about your discharge instructions: Diet   No. Medications  No. Follow up visit  No.  Do you have questions or concerns about your Care? No.  Actions: * If pain score is 4 or above: No action needed, pain <4.

## 2014-08-28 NOTE — Telephone Encounter (Signed)
Patient notified of appointment and instructions.

## 2014-08-28 NOTE — Telephone Encounter (Signed)
Scheduled GES at [facility] radiology(Al[facility]) on 09/04/14 at 9:30 AM. NPO after midnight and no stomach medication.

## 2014-09-03 ENCOUNTER — Telehealth: Payer: Self-pay | Admitting: Gastroenterology

## 2014-09-04 ENCOUNTER — Other Ambulatory Visit: Payer: Self-pay

## 2014-09-04 ENCOUNTER — Ambulatory Visit (HOSPITAL_COMMUNITY): Payer: Medicare Other

## 2014-09-04 MED ORDER — RANITIDINE HCL 150 MG PO TABS: 150.0000 mg | ORAL_TABLET | Freq: Two times a day (BID) | ORAL | Status: DC

## 2014-09-04 NOTE — Telephone Encounter (Signed)
Symptoms could be related to Zegerid.  DC and begin Zantac 150 mg twice a day

## 2014-09-04 NOTE — Telephone Encounter (Signed)
advised

## 2014-09-04 NOTE — Telephone Encounter (Signed)
The hospital rescheduled her GES to 09/19/14. Patient has less reflux since starting Zegerid, but she is now experiencing increased intestinal gas, loose BM's and abdominal cramps. She denies bloody stools, fever or nausea.

## 2014-09-06 ENCOUNTER — Encounter (HOSPITAL_COMMUNITY): Payer: Self-pay

## 2014-09-06 ENCOUNTER — Encounter (HOSPITAL_COMMUNITY)
Admission: RE | Admit: 2014-09-06 | Discharge: 2014-09-06 | Disposition: A | Discharge status: Home / Self Care | Payer: Medicare Other | Source: Ambulatory Visit | Attending: Gastroenterology | Admitting: Gastroenterology

## 2014-09-06 DIAGNOSIS — K219 Gastro-esophageal reflux disease without esophagitis: Secondary | ICD-10-CM | POA: Diagnosis present

## 2014-09-06 MED ORDER — TECHNETIUM TC 99M SULFUR COLLOID
2.0000 | Freq: Once | INTRAVENOUS | Status: AC | PRN
  Administered 2014-09-06: 2 via INTRAVENOUS

## 2014-09-07 NOTE — Progress Notes (Signed)
Quick Note:  Please inform the patient that the GES was normal and to continue current plan of action ______

## 2014-09-10 ENCOUNTER — Other Ambulatory Visit: Payer: Self-pay

## 2014-09-10 ENCOUNTER — Telehealth: Payer: Self-pay

## 2014-09-10 NOTE — Telephone Encounter (Signed)
Patient stopped the Zegrid and began Zantac. She is having heartburn despite Zantac. Bowel movements are less urgent. No headaches. She is not drinking sodas. Please advise on the next step.

## 2014-09-10 NOTE — Telephone Encounter (Signed)
Was patient feeling better on Zegerid?  If so she should switch back.  Symptoms are primarily nocturnal she can takes Zegerid daily at bedtime instead of every morning

## 2014-09-10 NOTE — Telephone Encounter (Signed)
We stopped her Zegerid due to the loose and urgent BM's. This has improved a little but has not resolved. Her heartburn is worse.

## 2014-09-11 ENCOUNTER — Other Ambulatory Visit: Payer: Self-pay

## 2014-09-11 NOTE — Telephone Encounter (Signed)
Patient notified

## 2014-09-11 NOTE — Telephone Encounter (Signed)
Try dexilant 60 mg before breakfast

## 2014-09-12 ENCOUNTER — Other Ambulatory Visit: Payer: Self-pay

## 2014-09-12 MED ORDER — DEXLANSOPRAZOLE 60 MG PO CPDR: 60.0000 mg | DELAYED_RELEASE_CAPSULE | Freq: Every day | ORAL | Status: DC

## 2014-09-19 ENCOUNTER — Ambulatory Visit (HOSPITAL_COMMUNITY): Payer: Medicare Other

## 2014-10-11 ENCOUNTER — Ambulatory Visit (INDEPENDENT_AMBULATORY_CARE_PROVIDER_SITE_OTHER): Payer: Medicare Other | Admitting: Pulmonary Disease

## 2014-10-11 ENCOUNTER — Encounter: Payer: Self-pay | Admitting: Gastroenterology

## 2014-10-11 ENCOUNTER — Encounter: Payer: Self-pay | Admitting: Pulmonary Disease

## 2014-10-11 ENCOUNTER — Ambulatory Visit (INDEPENDENT_AMBULATORY_CARE_PROVIDER_SITE_OTHER): Payer: Medicare Other | Admitting: Gastroenterology

## 2014-10-11 ENCOUNTER — Telehealth: Payer: Self-pay | Admitting: Gastroenterology

## 2014-10-11 VITALS — BP 154/70 | HR 60 | Ht 61.0 in | Wt 158.0 lb

## 2014-10-11 VITALS — BP 142/78 | HR 50 | Temp 97.1°F | Ht 61.0 in | Wt 159.8 lb

## 2014-10-11 DIAGNOSIS — R197 Diarrhea, unspecified: Secondary | ICD-10-CM | POA: Diagnosis not present

## 2014-10-11 DIAGNOSIS — G4733 Obstructive sleep apnea (adult) (pediatric): Secondary | ICD-10-CM | POA: Diagnosis not present

## 2014-10-11 DIAGNOSIS — Z860100 Personal history of colon polyps, unspecified: Secondary | ICD-10-CM | POA: Insufficient documentation

## 2014-10-11 DIAGNOSIS — K625 Hemorrhage of anus and rectum: Secondary | ICD-10-CM

## 2014-10-11 DIAGNOSIS — K222 Esophageal obstruction: Secondary | ICD-10-CM | POA: Diagnosis not present

## 2014-10-11 MED ORDER — RIFAXIMIN 550 MG PO TABS: 550.0000 mg | ORAL_TABLET | Freq: Two times a day (BID) | ORAL | Status: DC

## 2014-10-11 MED ORDER — NA SULFATE-K SULFATE-MG SULF 17.5-3.13-1.6 GM/177ML PO SOLN: 1.0000 | Freq: Once | ORAL | Status: DC

## 2014-10-11 NOTE — Telephone Encounter (Signed)
Just received request from pharmacy.

## 2014-10-11 NOTE — Patient Instructions (Signed)
Will get you new supplies  Will refer to sleep center for mask fitting session. Work on weight loss followup with me again in one year if doing well.

## 2014-10-11 NOTE — Patient Instructions (Addendum)
You have been scheduled for a colonoscopy. Please follow written instructions given to you at your visit today.  Please pick up your prep supplies at the pharmacy within the next 1-3 days. If you use inhalers (even only as needed), please bring them with you on the day of your procedure. Your physician has requested that you go to www.startemmi.com and enter the access code given to you at your visit today. This web site gives a general overview about your procedure. However, you should still follow specific instructions given to you by our office regarding your preparation for the procedure.   We sent in a new prescription to your pharmacy    ______________________________________________________________________  X   INSULIN (LONG ACTING) MEDICATION INSTRUCTIONS (Lantus, NPH, 70/30, Humulin, Novolin-N, Levemir, )   The day before your procedure:  Take  your regular evening dose    The day of your procedure:  Do not take your morning dose

## 2014-10-11 NOTE — Progress Notes (Signed)
      History of Present Illness:  [name] complains of severe diarrhea occurring 10-12 times a day, typically, accompanied by urgency.  This is a long standing problem.  She has also seen blood mixed with her stools.  She apparently had polyps removed 3 years ago.  She is concerned about the bleeding and because her sister has a history of colonic polyposis.  Dysphagia has resolved following esophageal dilation.    Review of Systems: Pertinent positive and negative review of systems were noted in the above HPI section. All other review of systems were otherwise negative.    Current Medications, Allergies, Past Medical History, Past Surgical History, Family History and Social History were reviewed in [facility] record  Vital signs were reviewed in today's medical record. Physical Exam: General: Well developed , well nourished, no acute distress   See Assessment and Plan under Problem List

## 2014-10-11 NOTE — Assessment & Plan Note (Signed)
Diarrhea could be related to bacterial overgrowth associated with diabetes and problems with fertility.  IBS is also a concern.  Recommendations #1 empiric trial of xifaxan 550 mg twice a day for 10 days

## 2014-10-11 NOTE — Assessment & Plan Note (Signed)
Limited rectal bleeding may be related to hemorrhoids.  In view of history of colon polyps more proximal colonic bleeding source should be ruled out.    Recommendations #1 colonoscopy

## 2014-10-11 NOTE — Assessment & Plan Note (Signed)
The patient is now back on bilevel after a period of time where she could not wear the device because of severe reflux with vomiting during the night. She has now seen a gastroenterologist and is doing much better, and has just gotten back on the bilevel. She is overdue for new supplies, and is having issues with her mask fit.  We'll get her referred to the sleep Center for a formal mask fitting. I have also encouraged her to work aggressively on weight loss.

## 2014-10-11 NOTE — Assessment & Plan Note (Signed)
Asymptomatic following esophageal dilation

## 2014-10-11 NOTE — Progress Notes (Signed)
   Subjective:    Patient ID: [name], female    DOB: 12/21/1950, 64 y.o.   MRN: [id number]  HPI The patient comes in today for follow-up of her known obstructive sleep apnea. At the last visit, she was having issues with nocturnal vomiting secondary to esophageal dysmotility. She has now been seen by gastroenterology, and feels that she is doing much better. He has restarted her bilevel device, but is overdue for supplies and is having mask fit issues. She also has gained 10 pounds since the last visit.   Review of Systems  Constitutional: Negative for fever and unexpected weight change.  HENT: Negative for congestion, dental problem, ear pain, nosebleeds, postnasal drip, rhinorrhea, sinus pressure, sneezing, sore throat and trouble swallowing.   Eyes: Negative for redness and itching.  Respiratory: Negative for cough, chest tightness, shortness of breath and wheezing.   Cardiovascular: Negative for palpitations and leg swelling.  Gastrointestinal: Negative for nausea and vomiting.  Genitourinary: Negative for dysuria.  Musculoskeletal: Negative for joint swelling.  Skin: Negative for rash.  Neurological: Negative for headaches.  Hematological: Does not bruise/bleed easily.  Psychiatric/Behavioral: Negative for dysphoric mood. The patient is not nervous/anxious.        Objective:   Physical Exam Overweight female in no acute distress Nose without purulence or discharge noted No skin breakdown or pressure necrosis from the C Pap mask Neck without lymphadenopathy or thyromegaly Lower extremities without edema, no cyanosis Alert and oriented, does not appear to be sleepy, moves all 4 extremities.       Assessment & Plan:

## 2014-10-11 NOTE — Assessment & Plan Note (Signed)
Polyps removed 3 years ago.  In view of rectal bleeding will follow-up colonoscopy at this time

## 2014-10-16 ENCOUNTER — Encounter: Payer: Self-pay | Admitting: *Deleted

## 2014-10-16 NOTE — Telephone Encounter (Signed)
PATIENT AWARE THAT XIFAXIAN IS APPROVED WITH A 50 COPAY

## 2014-10-16 NOTE — Telephone Encounter (Signed)
SENT INFORMATION TO COVER MY MEDS TODAY

## 2014-10-24 ENCOUNTER — Ambulatory Visit (INDEPENDENT_AMBULATORY_CARE_PROVIDER_SITE_OTHER): Payer: Medicare Other | Admitting: Cardiology

## 2014-10-24 ENCOUNTER — Encounter: Payer: Self-pay | Admitting: Gastroenterology

## 2014-10-24 ENCOUNTER — Encounter: Payer: Self-pay | Admitting: Cardiology

## 2014-10-24 VITALS — BP 138/64 | HR 58 | Ht 62.0 in | Wt 157.2 lb

## 2014-10-24 DIAGNOSIS — I25119 Atherosclerotic heart disease of native coronary artery with unspecified angina pectoris: Secondary | ICD-10-CM

## 2014-10-24 DIAGNOSIS — I10 Essential (primary) hypertension: Secondary | ICD-10-CM

## 2014-10-24 DIAGNOSIS — I6522 Occlusion and stenosis of left carotid artery: Secondary | ICD-10-CM | POA: Diagnosis not present

## 2014-10-24 DIAGNOSIS — E782 Mixed hyperlipidemia: Secondary | ICD-10-CM | POA: Diagnosis not present

## 2014-10-24 NOTE — Patient Instructions (Signed)
Your physician wants you to follow-up in: 6 months with Dr.McDowell You will receive a reminder letter in the mail two months in advance. If you don't receive a letter, please call our office to schedule the follow-up appointment.   Your physician recommends that you continue on your current medications as directed. Please refer to the Current Medication list given to you today.    Your physician has requested that you have a carotid duplex. This test is an ultrasound of the carotid arteries in your neck. It looks at blood flow through these arteries that supply the brain with blood. Allow one hour for this exam. There are no restrictions or special instructions.     Thank you for choosing [facility] !         

## 2014-10-24 NOTE — Progress Notes (Signed)
Cardiology Office Note  Date: 10/24/2014   ID: [name], DOB 05-06-1951, MRN [id number]  PCP: Gar Ponto, MD  Primary Cardiologist: Rozann Lesches, MD   Chief Complaint  Patient presents with  . Coronary Artery Disease  . Hypertension    History of Present Illness: [name] is a 64 y.o. female last seen in October 2015. She presents for a routine visit. From a cardiac perspective, she has not had any angina symptoms. She is planning on trying to diet and pursue a regular exercise plan to lose weight. She reports NYHA class II dyspnea. We reviewed her cardiac medications, she reports compliance.  She has a history of statin intolerance as documented previously. Fortunately, she did tolerate Zetia and her lipid numbers are improving as noted below.  Follow-up tracing is reviewed.  Last carotid Dopplers were in April 2015 as outlined below.   Past Medical History  Diagnosis Date  . Essential hypertension, benign   . Mixed hyperlipidemia   . GERD (gastroesophageal reflux disease)   . Anxiety   . Type 2 diabetes mellitus   . IBS (irritable bowel syndrome)   . Asthma   . Fibromyalgia   . Pancreatitis 2008  . Barrett esophagus   . Coronary atherosclerosis of native coronary artery     DES to LAD May 2014  . Pneumonia 1990's  . Chronic bronchitis   . OSA on CPAP   . H/O hiatal hernia   . Migraines   . Arthritis   . Chronic lower back pain   . Diverticulosis   . Major depression in partial remission   . Fatty liver   . Chronic diarrhea      Current Outpatient Prescriptions  Medication Sig Dispense Refill  . ACCU-CHEK SMARTVIEW test strip     . albuterol (PROVENTIL HFA;VENTOLIN HFA) 108 (90 BASE) MCG/ACT inhaler Inhale 1 puff into the lungs every 6 (six) hours as needed for wheezing or shortness of breath.    . ALPRAZolam (XANAX) 1 MG tablet Take 1 mg by mouth at bedtime as needed for anxiety.    Marland Kitchen amLODipine (NORVASC) 5 MG tablet Take 7.5 mg by mouth  daily.    Marland Kitchen aspirin EC 81 MG tablet Take 81 mg by mouth daily.    Marland Kitchen BIOTIN PO Take 1 tablet by mouth every morning.    . Blood Glucose Monitoring Suppl (ACCU-CHEK NANO SMARTVIEW) W/DEVICE KIT Use as directed    . cetirizine (ZYRTEC) 10 MG tablet Take 10 mg by mouth daily.    . Cyanocobalamin (VITAMIN B-12) 2500 MCG SUBL Place 1 tablet under the tongue daily.    Marland Kitchen dexlansoprazole (DEXILANT) 60 MG capsule Take 1 capsule (60 mg total) by mouth daily. 90 capsule 3  . docusate sodium (COLACE) 100 MG capsule Take 100 mg by mouth 2 (two) times daily.    . DULoxetine (CYMBALTA) 60 MG capsule Take 60 mg by mouth at bedtime.    Marland Kitchen ezetimibe (ZETIA) 10 MG tablet Take 1 tablet (10 mg total) by mouth daily. 90 tablet 3  . HYDROcodone-acetaminophen (NORCO/VICODIN) 5-325 MG per tablet Take 1 tablet by mouth at bedtime as needed for moderate pain.    . Lactobacillus (ULTIMATE PROBIOTIC FORMULA PO) Take 1 tablet by mouth daily. Ultimate Flora    . LANTUS SOLOSTAR 100 UNIT/ML Solostar Pen Inject 70 Units into the skin at bedtime.     . lidocaine (LIDODERM) 5 % Place 3 patches onto the skin daily as needed (for  pain). Remove & Discard patch within 12 hours or as directed by MD    . loratadine (CLARITIN) 10 MG tablet Take 10 mg by mouth daily.    Marland Kitchen losartan-hydrochlorothiazide (HYZAAR) 100-25 MG per tablet Take 1 tablet by mouth daily.    . metoprolol (LOPRESSOR) 50 MG tablet Take 50 mg by mouth 2 (two) times daily.    . montelukast (SINGULAIR) 10 MG tablet Take 10 mg by mouth at bedtime.    . nitroGLYCERIN (NITROSTAT) 0.4 MG SL tablet Place 1 tablet (0.4 mg total) under the tongue every 5 (five) minutes as needed for chest pain. 90 tablet 1  . rifaximin (XIFAXAN) 550 MG TABS tablet Take 1 tablet (550 mg total) by mouth 2 (two) times daily. 20 tablet 0  . traZODone (DESYREL) 50 MG tablet Take 200 mg by mouth at bedtime as needed for sleep.    Marland Kitchen VITAMIN D, CHOLECALCIFEROL, PO Take 5,000 Units by mouth every morning.       No current facility-administered medications for this visit.    Allergies:  Erythromycin base; Azithromycin; Remeron; Zolpidem tartrate; and Sulfonamide derivatives   Social History: The patient  reports that she quit smoking about 21 years ago. Her smoking use included Cigarettes. She started smoking about 38 years ago. She has a 12 pack-year smoking history. She has never used smokeless tobacco. She reports that she does not drink alcohol or use illicit drugs.   ROS:  Please see the history of present illness. Otherwise, complete review of systems is positive for intermittent diarrhea. States that she will be having a colonoscopy soon with prior history of polyps. Reflux and aspiration are improving.  All other systems are reviewed and negative.   Physical Exam: VS:  BP 138/64 mmHg  Pulse 58  Ht _0  (1.575 m)  Wt 157 lb 3.2 oz (71.305 kg)  BMI 28.74 kg/m2  SpO2 95%, BMI Body mass index is 28.74 kg/(m^2).  Wt Readings from Last 3 Encounters:  10/24/14 157 lb 3.2 oz (71.305 kg)  10/11/14 159 lb 12.8 oz (72.485 kg)  10/11/14 158 lb (71.668 kg)     Appears comfortable at rest.  HEENT: Conjunctiva and lids normal, oropharynx clear.  Neck: Supple, no elevated JVP or carotid bruits, no thyromegaly.  Lungs: Diminished but clear to auscultation, nonlabored breathing at rest.  Cardiac: Regular rate and rhythm with occasional ectopy, no S3 or significant systolic murmur, no pericardial rub.  Abdomen: Soft, nontender, bowel sounds present, no guarding or rebound.  Extremities: No pitting edema, distal pulses 2+.  Skin: Warm and dry.  Musculoskeletal: No kyphosis.  Neuropsychiatric: Alert and oriented x3, affect grossly appropriate.   ECG: ECG is ordered today and reviewed showing sinus rhythm with incomplete right bundle branch block.  Recent Labwork: 05/02/2014: BUN 11; Creatinine 0.55; Hemoglobin 13.6; Platelets 330; Potassium 4.3; Sodium 142   09/19/2014 ([facility])  cholesterol 184, triglycerides 196, HDL 40, LDL 105, HbgA1c 7.3, TSH 1.59, BUN 10, creatinine 0.6, potassium 4.0, AST 13, ALT 22  Additional Studies Reviewed:  Carotid Dopplers 11/10/2013: IMPRESSION: 1. Elevated peak systolic velocity consistent with a 50- 69% diameter stenosis in the distal left internal carotid artery. Artifactual elevation of the velocities is also possible and distal aspects of the internal carotid artery. 2. Heterogeneous atherosclerotic plaque results in a less than 50% diameter stenosis of the proximal right internal carotid artery. 3. Vertebral arteries are patent with normal antegrade flow. Signed,  ASSESSMENT AND PLAN:  1. Symptomatically stable CAD status post  DES to the LAD in May 2014. Plan is to continue medical therapy and observation. I did encourage regular exercise plan.  2. Carotid artery disease, moderate on the left. Follow-up carotid Dopplers will be obtained.  3. Essential hypertension, no change in current antihypertensive regimen. Blood pressure control reasonable today.  4. Hyperlipidemia, tolerating Zetia with improving lipid numbers.  Current medicines were reviewed at length with the patient today.   Orders Placed This Encounter  Procedures  . EKG 12-Lead  . Carotid duplex    Disposition: FU with me in 6 months.   Signed, [name], MD, [facility] 10/24/2014 10:48 AM    [facility] [place]. [address] [place], [place] [address] Phone: [phone]; Fax: [phone]

## 2014-10-31 ENCOUNTER — Encounter (INDEPENDENT_AMBULATORY_CARE_PROVIDER_SITE_OTHER): Payer: Medicare Other

## 2014-10-31 DIAGNOSIS — I6523 Occlusion and stenosis of bilateral carotid arteries: Secondary | ICD-10-CM | POA: Diagnosis not present

## 2014-10-31 DIAGNOSIS — I6522 Occlusion and stenosis of left carotid artery: Secondary | ICD-10-CM

## 2014-11-20 ENCOUNTER — Encounter: Payer: Self-pay | Admitting: Gastroenterology

## 2014-11-20 ENCOUNTER — Ambulatory Visit (AMBULATORY_SURGERY_CENTER): Payer: Medicare Other | Admitting: Gastroenterology

## 2014-11-20 VITALS — BP 109/54 | HR 64 | Temp 98.8°F | Resp 35 | Ht 61.0 in | Wt 158.0 lb

## 2014-11-20 DIAGNOSIS — Z86010 Personal history of colonic polyps: Secondary | ICD-10-CM | POA: Diagnosis not present

## 2014-11-20 DIAGNOSIS — K5730 Diverticulosis of large intestine without perforation or abscess without bleeding: Secondary | ICD-10-CM

## 2014-11-20 DIAGNOSIS — K648 Other hemorrhoids: Secondary | ICD-10-CM

## 2014-11-20 DIAGNOSIS — K625 Hemorrhage of anus and rectum: Secondary | ICD-10-CM

## 2014-11-20 DIAGNOSIS — R197 Diarrhea, unspecified: Secondary | ICD-10-CM | POA: Diagnosis not present

## 2014-11-20 LAB — GLUCOSE, CAPILLARY
GLUCOSE-CAPILLARY: 85 mg/dL (ref 70–99)
GLUCOSE-CAPILLARY: 77 mg/dL (ref 70–99)

## 2014-11-20 MED ORDER — SODIUM CHLORIDE 0.9 % IV SOLN: 500.0000 mL | INTRAVENOUS | Status: DC

## 2014-11-20 MED ORDER — HYDROCORTISONE ACETATE 25 MG RE SUPP: 25.0000 mg | Freq: Two times a day (BID) | RECTAL | Status: DC

## 2014-11-20 NOTE — Op Note (Addendum)
[facility]  [address]. [place], [address]   COLONOSCOPY PROCEDURE REPORT  PATIENT: [name], [name]  MR#: [id number] BIRTHDATE: Jun 14, 1951 , 62  yrs. old GENDER: female ENDOSCOPIST: Inda Castle, MD REFERRED BY: PROCEDURE DATE:  11/20/2014 PROCEDURE:   Colonoscopy, diagnostic and Colonoscopy with biopsy First Screening Colonoscopy - Avg.  risk and is 50 yrs.  old or older - No.  Prior Negative Screening - Now for repeat screening. N/A  History of Adenoma - Now for follow-up colonoscopy & has been > or = to 3 yrs.  Yes hx of adenoma.  Has been 3 or more years since last colonoscopy. ASA CLASS:   Class II INDICATIONS:anal bleeding, chronic diarrhea, and high risk patient with personal history of colonic polyps. MEDICATIONS: Monitored anesthesia care, Propofol 400 mg IV, and Lidocaine 40 mg IV  DESCRIPTION OF PROCEDURE:   After the risks benefits and alternatives of the procedure were thoroughly explained, informed consent was obtained.  The digital rectal exam revealed no abnormalities of the rectum.   The LB CF-H180AL Loaner [id number] and LB PFC-H190 [id number]  endoscope was introduced through the anus and advanced to the cecum, which was identified by both the appendix and ileocecal valve. No adverse events experienced.   The quality of the prep was (Suprep was used) excellent.  The instrument was then slowly withdrawn as the colon was fully examined.      COLON FINDINGS: There was severe diverticulosis noted in the sigmoid colon with associated colonic spasm, colonic narrowing and muscular hypertrophy.   Internal hemorrhoids were found.   The examination was otherwise normal. random biopsies were taken throughout the colon to rule out microscopic colitis Retroflexed views revealed no abnormalities. The time to cecum = 14.3 Withdrawal time = 5.9   The scope was withdrawn and the procedure completed. COMPLICATIONS: There were no immediate  complications.  ENDOSCOPIC IMPRESSION: 1.   There was severe diverticulosis noted in the sigmoid colon 2.   Internal hemorrhoids 3.   The examination was otherwise normal  rectal bleeding is probably hemorrhoidal  RECOMMENDATIONS: Anusol HC suppositories Await biopsy results Colonoscopy 10 years  eSigned:  Inda Castle, MD 11/27/2014 7:37 PM Revised: 11/27/2014 7:37 PM  cc: Gar Ponto, MD

## 2014-11-20 NOTE — Progress Notes (Signed)
Called to room to assist during endoscopic procedure.  Patient ID and intended procedure confirmed with present staff. Received instructions for my participation in the procedure from the performing physician.  

## 2014-11-20 NOTE — Progress Notes (Signed)
Per Dr. [name] ok to discharge home. Stated I feel a little better.instructed to call back if needed. To eat a light meal in the beginning.  Abdomin soft. No facial grimacing.

## 2014-11-20 NOTE — Patient Instructions (Signed)
Discharge instructions given. Handouts on diverticulosis and hemorrhoids. Resume previous medications. YOU HAD AN ENDOSCOPIC PROCEDURE TODAY AT THE [facility]:   Refer to the procedure report that was given to you for any specific questions about what was found during the examination.  If the procedure report does not answer your questions, please call your gastroenterologist to clarify.  If you requested that your care partner not be given the details of your procedure findings, then the procedure report has been included in a sealed envelope for you to review at your convenience later.  YOU SHOULD EXPECT: Some feelings of bloating in the abdomen. Passage of more gas than usual.  Walking can help get rid of the air that was put into your GI tract during the procedure and reduce the bloating. If you had a lower endoscopy (such as a colonoscopy or flexible sigmoidoscopy) you may notice spotting of blood in your stool or on the toilet paper. If you underwent a bowel prep for your procedure, you may not have a normal bowel movement for a few days.  Please Note:  You might notice some irritation and congestion in your nose or some drainage.  This is from the oxygen used during your procedure.  There is no need for concern and it should clear up in a day or so.  SYMPTOMS TO REPORT IMMEDIATELY:   Following lower endoscopy (colonoscopy or flexible sigmoidoscopy):  Excessive amounts of blood in the stool  Significant tenderness or worsening of abdominal pains  Swelling of the abdomen that is new, acute  Fever of 100F or higher   For urgent or emergent issues, a gastroenterologist can be reached at any hour by calling [phone].   DIET: Your first meal following the procedure should be a small meal and then it is ok to progress to your normal diet. Heavy or fried foods are harder to digest and may make you feel nauseous or bloated.  Likewise, meals heavy in dairy and vegetables can  increase bloating.  Drink plenty of fluids but you should avoid alcoholic beverages for 24 hours.  ACTIVITY:  You should plan to take it easy for the rest of today and you should NOT DRIVE or use heavy machinery until tomorrow (because of the sedation medicines used during the test).    FOLLOW UP: Our staff will call the number listed on your records the next business day following your procedure to check on you and address any questions or concerns that you may have regarding the information given to you following your procedure. If we do not reach you, we will leave a message.  However, if you are feeling well and you are not experiencing any problems, there is no need to return our call.  We will assume that you have returned to your regular daily activities without incident.  If any biopsies were taken you will be contacted by phone or by letter within the next 1-3 weeks.  Please call us at [phone] if you have not heard about the biopsies in 3 weeks.    SIGNATURES/CONFIDENTIALITY: You and/or your care partner have signed paperwork which will be entered into your electronic medical record.  These signatures attest to the fact that that the information above on your After Visit Summary has been reviewed and is understood.  Full responsibility of the confidentiality of this discharge information lies with you and/or your care-partner. 

## 2014-11-20 NOTE — Progress Notes (Signed)
Assist to bathroom to aide in expelling of air. No success with air expelling. levsin  Given with out success. Dr. [name] notified of patient inability to expel air. Abdomin soft and nondistented. Burped and belched. Dr. [name] will see patient before discharge.

## 2014-11-20 NOTE — Progress Notes (Signed)
Stable to RR 

## 2014-11-21 ENCOUNTER — Telehealth: Payer: Self-pay | Admitting: *Deleted

## 2014-11-21 MED ORDER — HYOSCYAMINE SULFATE ER 0.375 MG PO TBCR: 1.0000 | EXTENDED_RELEASE_TABLET | Freq: Two times a day (BID) | ORAL | Status: DC | PRN

## 2014-11-21 NOTE — Telephone Encounter (Signed)
  Follow up Call-  Call back number 11/20/2014 08/27/2014  Post procedure Call Back phone  # [phone]  Permission to leave phone message Yes Yes     Patient questions:  Do you have a fever, pain , or abdominal swelling? Yes.   Pain Score  3 *  Have you tolerated food without any problems? Yes.    Have you been able to return to your normal activities? Yes.    Do you have any questions about your discharge instructions: Diet   No. Medications  No. Follow up visit  No.  Do you have questions or concerns about your Care? Yes.    Actions: * If pain score is 4 or above: No action needed, pain <4. Patient reports a lot  of discomfort last evening and early morning(0100).Reports some hemorrhoidal bleeding last evening. Patient stating during the evening, abdomen tight until gas passed.Some nausea noted, no vomitus, temperature at highest yesterday 99. Patient took one of her Vicodans last evening without relief. This am able to eat and drink and go about her normal activities.  Patient stating her abdomen is tender, she became nauseated when leaning against her washer this am. Encouraged patient to drink warm fluids, walk about her home and try knee chest position if she has difficulty passing air today. Patient instructed to call if abdomen swells or pain greater than 4. Patient to pick up Anusol  And Gasx today at drug store.  Note forwarded to Dr [name] for any further suggestions.

## 2014-11-21 NOTE — Telephone Encounter (Signed)
Dr [name] ordering Hyomax as needed for abdominal cramping. called patient to inform of new medication, will be sent to [facility] in [place]. Patient verbalized understanding and agreement. Unable to escribe, called [facility] and spoke with Marjory Lies, prescription given via telephone with read back.

## 2014-11-21 NOTE — Telephone Encounter (Signed)
Hyomax 0.375 mg twice a day when necessary abdominal pain

## 2014-11-27 ENCOUNTER — Encounter: Payer: Self-pay | Admitting: Gastroenterology

## 2014-11-28 ENCOUNTER — Encounter: Payer: Self-pay | Admitting: *Deleted

## 2014-12-06 ENCOUNTER — Telehealth: Payer: Self-pay | Admitting: Gastroenterology

## 2014-12-06 NOTE — Telephone Encounter (Signed)
Did she ever take xifaxan?  If not she should take 550mg  3 times a day for 14 days. If she did take xifaxan then I will arrange for a breath test per research and start her on Viberzi

## 2014-12-06 NOTE — Telephone Encounter (Signed)
Patient is aware of her normal colon biopsy. She reports she has had some improvement in her symptoms of diarrhea and abdominal cramping. She is taking hyoscyamine twice daily. She does not eat if she is out of the house or going out because that will trigger her symptoms. She has a follow up appointment with you. Please advise.

## 2014-12-06 NOTE — Telephone Encounter (Signed)
She completed a 10 day course of Xifaxin before the colonoscopy.

## 2014-12-11 NOTE — Telephone Encounter (Signed)
I will have research contact her for a breath test then will start Viberzi.

## 2014-12-11 NOTE — Telephone Encounter (Signed)
Patient advised.

## 2014-12-11 NOTE — Telephone Encounter (Signed)
I have left message for the patient to call back 

## 2014-12-12 ENCOUNTER — Other Ambulatory Visit: Payer: Self-pay | Admitting: Cardiology

## 2014-12-12 MED ORDER — METOPROLOL TARTRATE 50 MG PO TABS: 50.0000 mg | ORAL_TABLET | Freq: Two times a day (BID) | ORAL | Status: DC

## 2014-12-12 NOTE — Telephone Encounter (Signed)
Needs refill on Metoprolol sent to [facility] / patient is switching pharmacies / tg

## 2014-12-24 ENCOUNTER — Telehealth: Payer: Self-pay | Admitting: Cardiology

## 2014-12-24 NOTE — Telephone Encounter (Signed)
Patient has noted BP elevated when taking am readings, 175/112, 190/115.Patient is going to come in today with her machine so I can check against ours

## 2014-12-24 NOTE — Telephone Encounter (Signed)
Patient would like to speak with nurse regarding problems she is having with hydration and elevated BP / tg

## 2015-01-01 ENCOUNTER — Ambulatory Visit (INDEPENDENT_AMBULATORY_CARE_PROVIDER_SITE_OTHER): Payer: Medicare Other | Admitting: Gastroenterology

## 2015-01-01 ENCOUNTER — Encounter: Payer: Self-pay | Admitting: Gastroenterology

## 2015-01-01 DIAGNOSIS — R197 Diarrhea, unspecified: Secondary | ICD-10-CM | POA: Diagnosis not present

## 2015-01-01 MED ORDER — AMPICILLIN 250 MG PO CAPS: ORAL_CAPSULE | ORAL | Status: DC

## 2015-01-01 NOTE — Patient Instructions (Signed)
Follow up in 3 months We will renew your Hyomax

## 2015-01-01 NOTE — Progress Notes (Signed)
      History of Present Illness:  [name] continues to have diarrhea on most daily.  She's afraid to go out or TE and go out because of her symptoms.  She feels that symptoms did transiently improved with xifaxan.  Colonoscopy including biopsies was negative.  She complains of crampy abdominal pain which is improved with hyomax.    Review of Systems: Pertinent positive and negative review of systems were noted in the above HPI section. All other review of systems were otherwise negative.    Current Medications, Allergies, Past Medical History, Past Surgical History, Family History and Social History were reviewed in [facility] record  Vital signs were reviewed in today's medical record. Physical Exam: General: Well developed , well nourished, no acute distress   See Assessment and Plan under Problem List

## 2015-01-01 NOTE — Assessment & Plan Note (Signed)
Patient did respond to xifaxan.  This suggests that bacterial overgrowth may be operative.  Plan to treat with ampicillin one week out of the month

## 2015-02-15 ENCOUNTER — Ambulatory Visit: Payer: Medicare Other | Admitting: Gastroenterology

## 2015-04-24 ENCOUNTER — Encounter: Payer: Self-pay | Admitting: Gastroenterology

## 2015-04-24 ENCOUNTER — Ambulatory Visit (INDEPENDENT_AMBULATORY_CARE_PROVIDER_SITE_OTHER): Payer: Medicare Other | Admitting: Gastroenterology

## 2015-04-24 DIAGNOSIS — R197 Diarrhea, unspecified: Secondary | ICD-10-CM | POA: Diagnosis not present

## 2015-04-24 MED ORDER — ALOSETRON HCL 0.5 MG PO TABS: 0.5000 mg | ORAL_TABLET | Freq: Two times a day (BID) | ORAL | Status: DC

## 2015-04-24 MED ORDER — ALOSETRON HCL 1 MG PO TABS: 0.5000 mg | ORAL_TABLET | Freq: Two times a day (BID) | ORAL | Status: DC

## 2015-04-24 NOTE — Progress Notes (Signed)
      History of Present Illness:  [name] continues to complain of severe diarrhea.  She has 10-12 bowel movements during the day but it does not awaken her.  She's had incontinence.  In the past she had very minimal improvement with xifaxan and ampicillin.  She states that symptoms have worsened over the past few months but date back to her childhood with regard to diarrhea.  Colonoscopy with random biopsies in May, 2016 was unremarkable.    Review of Systems: Pertinent positive and negative review of systems were noted in the above HPI section. All other review of systems were otherwise negative.    Current Medications, Allergies, Past Medical History, Past Surgical History, Family History and Social History were reviewed in [facility] record  Vital signs were reviewed in today's medical record. Physical Exam: General: Well developed , well nourished, no acute distress   See Assessment and Plan under Problem List

## 2015-04-24 NOTE — Patient Instructions (Signed)
You will follow up with Dr Silverio Decamp on 06-17-2015 Monday at 1:45pm We will give you a printed prescription of Lotronex

## 2015-04-24 NOTE — Assessment & Plan Note (Signed)
Diarrhea is very likely related to IBS.  Absence of response to ampicillin and a short course of xifaxan mitigate against bacterial overgrowth.  Since she has a history of pancreatitis she is not a candidate for Viberzi.  Plan to start Lotronex.  She was carefully instructed to stop the medication if she develops constipation or abdominal pain.  She will follow up in several weeks with Dr. Silverio Decamp.

## 2015-04-30 ENCOUNTER — Other Ambulatory Visit: Payer: Self-pay | Admitting: Cardiology

## 2015-05-10 ENCOUNTER — Encounter: Payer: Self-pay | Admitting: Cardiology

## 2015-05-10 ENCOUNTER — Ambulatory Visit (INDEPENDENT_AMBULATORY_CARE_PROVIDER_SITE_OTHER): Payer: Medicare Other | Admitting: Cardiology

## 2015-05-10 VITALS — BP 100/58 | HR 57 | Ht 62.0 in | Wt 155.0 lb

## 2015-05-10 DIAGNOSIS — E782 Mixed hyperlipidemia: Secondary | ICD-10-CM | POA: Diagnosis not present

## 2015-05-10 DIAGNOSIS — I2510 Atherosclerotic heart disease of native coronary artery without angina pectoris: Secondary | ICD-10-CM | POA: Diagnosis not present

## 2015-05-10 DIAGNOSIS — I10 Essential (primary) hypertension: Secondary | ICD-10-CM

## 2015-05-10 MED ORDER — NITROGLYCERIN 0.4 MG SL SUBL: 0.4000 mg | SUBLINGUAL_TABLET | SUBLINGUAL | Status: DC | PRN

## 2015-05-10 NOTE — Patient Instructions (Addendum)
Your physician wants you to follow-up in: 6 months with Dr Ferne Reus will receive a reminder letter in the mail two months in advance. If you don't receive a letter, please call our office to schedule the follow-up appointment.    Your physician recommends that you continue on your current medications as directed. Please refer to the Current Medication list given to you today.   I refilled your nitroglycerine    If you need a refill on your cardiac medications before your next appointment, please call your pharmacy.      Thank you for choosing [facility] !

## 2015-05-10 NOTE — Progress Notes (Signed)
Cardiology Office Note  Date: 05/10/2015   ID: [name], DOB Dec 08, 1950, MRN [id number]  PCP: Gar Ponto, MD  Primary Cardiologist: Rozann Lesches, MD   Chief Complaint  Patient presents with  . Coronary Artery Disease    History of Present Illness: [name] is a 64 y.o. female last seen in April. She presents for a routine cardiac visit. She tells me that she has had recent trouble with back pain due to disc disease and has had some steroid injections. She is following with Dr. Quillian Quince. Otherwise, no angina symptoms or unusual shortness of breath.  We reviewed her medications which are outlined below in stable from a cardiac perspective. He has a history of statin intolerance, but has been able to take Zetia. Recent lab work is reviewed below.  Today we refilled her nitroglycerin to make sure that she has a fresh bottle, although she has not needed to use any for angina so far.   Past Medical History  Diagnosis Date  . Essential hypertension, benign   . Mixed hyperlipidemia   . GERD (gastroesophageal reflux disease)   . Anxiety   . Type 2 diabetes mellitus ([name])   . IBS (irritable bowel syndrome)   . Asthma   . Fibromyalgia   . Pancreatitis 2008  . Barrett esophagus   . Coronary atherosclerosis of native coronary artery     DES to LAD May 2014  . Pneumonia 1990's  . Chronic bronchitis ([place])   . OSA on CPAP   . H/O hiatal hernia   . Migraines   . Arthritis   . Chronic lower back pain   . Diverticulosis   . Major depression in partial remission ([name])   . Fatty liver   . Chronic diarrhea     Current Outpatient Prescriptions  Medication Sig Dispense Refill  . ACCU-CHEK SMARTVIEW test strip     . albuterol (PROVENTIL HFA;VENTOLIN HFA) 108 (90 BASE) MCG/ACT inhaler Inhale 1 puff into the lungs every 6 (six) hours as needed for wheezing or shortness of breath.    . alosetron (LOTRONEX) 0.5 MG tablet Take 1 tablet (0.5 mg total) by mouth 2 (two) times  daily. 60 tablet 1  . alosetron (LOTRONEX) 1 MG tablet Take 0.5 tablets (0.5 mg total) by mouth 2 (two) times daily. 60 tablet 2  . ALPRAZolam (XANAX) 1 MG tablet Take 1 mg by mouth at bedtime as needed for anxiety.    Marland Kitchen amLODipine (NORVASC) 5 MG tablet TAKE 1 AND 1/2 TABLET BY MOUTH DAILY 135 tablet 1  . aspirin EC 81 MG tablet Take 81 mg by mouth daily.    Marland Kitchen BIOTIN PO Take 1 tablet by mouth every morning.    . Blood Glucose Monitoring Suppl (ACCU-CHEK NANO SMARTVIEW) W/DEVICE KIT Use as directed    . cetirizine (ZYRTEC) 10 MG tablet Take 10 mg by mouth daily.    . Cyanocobalamin (VITAMIN B-12) 2500 MCG SUBL Place 1 tablet under the tongue daily.    Marland Kitchen dexlansoprazole (DEXILANT) 60 MG capsule Take 1 capsule (60 mg total) by mouth daily. 90 capsule 3  . docusate sodium (COLACE) 100 MG capsule Take 100 mg by mouth 2 (two) times daily.    . DULoxetine (CYMBALTA) 60 MG capsule Take 60 mg by mouth at bedtime.    Marland Kitchen ezetimibe (ZETIA) 10 MG tablet Take 1 tablet (10 mg total) by mouth daily. 90 tablet 3  . HYDROcodone-acetaminophen (NORCO/VICODIN) 5-325 MG per tablet Take 1 tablet  by mouth at bedtime as needed for moderate pain.    Marland Kitchen Hyoscyamine Sulfate 0.375 MG TBCR Take 1 tablet (0.375 mg total) by mouth 2 (two) times daily as needed. 25 tablet 1  . Lactobacillus (ULTIMATE PROBIOTIC FORMULA PO) Take 1 tablet by mouth daily. Ultimate Flora    . LANTUS SOLOSTAR 100 UNIT/ML Solostar Pen Inject 70 Units into the skin at bedtime.     . lidocaine (LIDODERM) 5 % Place 3 patches onto the skin daily as needed (for pain). Remove & Discard patch within 12 hours or as directed by MD    . loratadine (CLARITIN) 10 MG tablet Take 10 mg by mouth daily.    Marland Kitchen losartan-hydrochlorothiazide (HYZAAR) 100-25 MG per tablet Take 1 tablet by mouth daily.    . metoprolol (LOPRESSOR) 50 MG tablet Take 1 tablet (50 mg total) by mouth 2 (two) times daily. 60 tablet 11  . montelukast (SINGULAIR) 10 MG tablet Take 10 mg by mouth at  bedtime.    . nitroGLYCERIN (NITROSTAT) 0.4 MG SL tablet Place 1 tablet (0.4 mg total) under the tongue every 5 (five) minutes as needed for chest pain. 90 tablet 1  . traZODone (DESYREL) 50 MG tablet Take 200 mg by mouth at bedtime as needed for sleep.    Marland Kitchen VITAMIN D, CHOLECALCIFEROL, PO Take 5,000 Units by mouth every morning.      No current facility-administered medications for this visit.    Allergies:  Erythromycin base; Azithromycin; Other; Pollen extract; Remeron; Tree extract; Zolpidem tartrate; and Sulfonamide derivatives   Social History: The patient  reports that she quit smoking about 21 years ago. Her smoking use included Cigarettes. She started smoking about 38 years ago. She has a 12 pack-year smoking history. She has never used smokeless tobacco. She reports that she does not drink alcohol or use illicit drugs.   ROS:  Please see the history of present illness. Otherwise, complete review of systems is positive for lower back pain.  All other systems are reviewed and negative.   Physical Exam: VS:  BP 100/58 mmHg  Pulse 57  Ht '5\' 2"'  (1.575 m)  Wt 155 lb (70.308 kg)  BMI 28.34 kg/m2  SpO2 95%, BMI Body mass index is 28.34 kg/(m^2).  Wt Readings from Last 3 Encounters:  05/10/15 155 lb (70.308 kg)  04/24/15 156 lb 4 oz (70.875 kg)  01/01/15 156 lb 9.6 oz (71.033 kg)     Appears comfortable at rest.  HEENT: Conjunctiva and lids normal, oropharynx clear.  Neck: Supple, no elevated JVP or carotid bruits, no thyromegaly.  Lungs: Diminished but clear to auscultation, nonlabored breathing at rest.  Cardiac: Regular rate and rhythm with occasional ectopy, no S3 or significant systolic murmur, no pericardial rub.  Abdomen: Soft, nontender, bowel sounds present, no guarding or rebound.  Extremities: No pitting edema, distal pulses 2+.    ECG: ECG is not ordered today.  Recent Labwork:  09/19/2014 ([facility]) cholesterol 184, triglycerides 196, HDL 40, LDL 105,  HbgA1c 7.3, TSH 1.59, BUN 10, creatinine 0.6, potassium 4.0, AST 13, ALT 22  Recent updated lipids showed cholesterol 197, triglycerides 466, HDL 34, LDL not calculated, hemoglobin A1c 8.0.  Other Studies Reviewed Today:  Carotid Dopplers 10/31/2014 showed 1-39% bilateral ICA stenoses.  ASSESSMENT AND PLAN:  1. CAD status post DES to the LAD in May 2014. She reports no angina symptoms on medical therapy. We will continue observation.  2. Essential hypertension, blood pressure is normal today.  3. Hyperlipidemia with  statin intolerance. She continues on Zetia. Triglycerides and hemoglobin A1c are elevated, we discussed maintaining follow with Dr. Quillian Quince to ensure ongoing management of type 2 diabetes mellitus. Triglycerides were only 196 earlier this year.  Current medicines were reviewed at length with the patient today.   Disposition: FU with me in 6 months.   Signed, [name], MD, [facility] 05/10/2015 11:01 AM    [facility] at [facility] [address] [place], [place] [address] Phone: [phone]; Fax: [phone]

## 2015-05-13 ENCOUNTER — Other Ambulatory Visit: Payer: Self-pay | Admitting: Cardiology

## 2015-06-17 ENCOUNTER — Ambulatory Visit: Payer: Medicare Other | Admitting: Gastroenterology

## 2015-08-12 ENCOUNTER — Other Ambulatory Visit: Payer: Self-pay | Admitting: Gastroenterology

## 2015-08-13 DIAGNOSIS — N898 Other specified noninflammatory disorders of vagina: Secondary | ICD-10-CM | POA: Diagnosis not present

## 2015-08-16 ENCOUNTER — Telehealth: Payer: Self-pay | Admitting: Gastroenterology

## 2015-08-19 NOTE — Telephone Encounter (Signed)
This pt is to establish with Dr [name] on 09-20-2015. Pt needs refills on Dexilant 60mg  1 po daily until follow up. Please advise.

## 2015-08-22 NOTE — Telephone Encounter (Signed)
Yes, OK for 1 month supply and refill x 1 until appointment

## 2015-08-23 DIAGNOSIS — E119 Type 2 diabetes mellitus without complications: Secondary | ICD-10-CM | POA: Diagnosis not present

## 2015-08-23 DIAGNOSIS — I10 Essential (primary) hypertension: Secondary | ICD-10-CM | POA: Diagnosis not present

## 2015-08-23 DIAGNOSIS — K210 Gastro-esophageal reflux disease with esophagitis: Secondary | ICD-10-CM | POA: Diagnosis not present

## 2015-08-23 DIAGNOSIS — E782 Mixed hyperlipidemia: Secondary | ICD-10-CM | POA: Diagnosis not present

## 2015-08-23 MED ORDER — DEXLANSOPRAZOLE 60 MG PO CPDR: 60.0000 mg | DELAYED_RELEASE_CAPSULE | Freq: Every day | ORAL | Status: DC

## 2015-08-23 NOTE — Telephone Encounter (Signed)
Dexilant 60 mg filled with 1 RF. Pt must keep follow up to receive additional refills.

## 2015-08-30 DIAGNOSIS — I10 Essential (primary) hypertension: Secondary | ICD-10-CM | POA: Diagnosis not present

## 2015-08-30 DIAGNOSIS — E1151 Type 2 diabetes mellitus with diabetic peripheral angiopathy without gangrene: Secondary | ICD-10-CM | POA: Diagnosis not present

## 2015-08-30 DIAGNOSIS — K219 Gastro-esophageal reflux disease without esophagitis: Secondary | ICD-10-CM | POA: Diagnosis not present

## 2015-08-30 DIAGNOSIS — G4733 Obstructive sleep apnea (adult) (pediatric): Secondary | ICD-10-CM | POA: Diagnosis not present

## 2015-08-30 DIAGNOSIS — I2510 Atherosclerotic heart disease of native coronary artery without angina pectoris: Secondary | ICD-10-CM | POA: Diagnosis not present

## 2015-08-30 DIAGNOSIS — K580 Irritable bowel syndrome with diarrhea: Secondary | ICD-10-CM | POA: Diagnosis not present

## 2015-08-30 DIAGNOSIS — R69 Illness, unspecified: Secondary | ICD-10-CM | POA: Diagnosis not present

## 2015-08-30 DIAGNOSIS — E782 Mixed hyperlipidemia: Secondary | ICD-10-CM | POA: Diagnosis not present

## 2015-08-30 DIAGNOSIS — I6523 Occlusion and stenosis of bilateral carotid arteries: Secondary | ICD-10-CM | POA: Diagnosis not present

## 2015-08-30 DIAGNOSIS — J301 Allergic rhinitis due to pollen: Secondary | ICD-10-CM | POA: Diagnosis not present

## 2015-09-18 DIAGNOSIS — J0101 Acute recurrent maxillary sinusitis: Secondary | ICD-10-CM | POA: Diagnosis not present

## 2015-09-18 DIAGNOSIS — K047 Periapical abscess without sinus: Secondary | ICD-10-CM | POA: Diagnosis not present

## 2015-09-20 ENCOUNTER — Ambulatory Visit: Payer: Medicare Other | Admitting: Gastroenterology

## 2015-09-23 DIAGNOSIS — M25562 Pain in left knee: Secondary | ICD-10-CM | POA: Diagnosis not present

## 2015-09-23 DIAGNOSIS — J069 Acute upper respiratory infection, unspecified: Secondary | ICD-10-CM | POA: Diagnosis not present

## 2015-09-23 DIAGNOSIS — J209 Acute bronchitis, unspecified: Secondary | ICD-10-CM | POA: Diagnosis not present

## 2015-10-18 ENCOUNTER — Encounter: Payer: Self-pay | Admitting: Gastroenterology

## 2015-10-18 ENCOUNTER — Ambulatory Visit (INDEPENDENT_AMBULATORY_CARE_PROVIDER_SITE_OTHER): Payer: Medicare HMO | Admitting: Gastroenterology

## 2015-10-18 VITALS — BP 138/70 | HR 72 | Ht 61.5 in | Wt 160.0 lb

## 2015-10-18 DIAGNOSIS — R1314 Dysphagia, pharyngoesophageal phase: Secondary | ICD-10-CM | POA: Diagnosis not present

## 2015-10-18 DIAGNOSIS — K589 Irritable bowel syndrome without diarrhea: Secondary | ICD-10-CM | POA: Diagnosis not present

## 2015-10-18 DIAGNOSIS — K219 Gastro-esophageal reflux disease without esophagitis: Secondary | ICD-10-CM

## 2015-10-18 NOTE — Patient Instructions (Addendum)
You have been scheduled for an esophageal manometry at [facility] on 10-21-2015 at 1pm. Please arrive 30 minutes prior to your procedure for registration. You will need to go to outpatient registration (1st floor of the hospital) first. Make certain to bring your insurance cards as well as a complete list of medications.  Please remember the following:  1) Nothing to eat after 12:00 midnight on the night before your test. You can have clear liquids until 4 hours prior. (9am)  2) Hold all diabetic medications/insulin the morning of the test. You may eat and take your medications after the test.  3) For 3 days prior to your test do not take: Dexilant, Prevacid, Nexium, Protonix, Aciphex, Zegerid, Pantoprazole, Prilosec or omeprazole.  4) For 2 days prior to your test, do not take: Reglan, Tagamet, Zantac, Axid or Pepcid.  5) You MAY use an antacid such as Rolaids or Tums up to 12 hours prior to your test.  It will take at least 2 weeks to receive the results of this test from your physician. ------------------------------------------ ABOUT ESOPHAGEAL MANOMETRY Esophageal manometry (muh-NOM-uh-tree) is a test that gauges how well your esophagus works. Your esophagus is the long, muscular tube that connects your throat to your stomach. Esophageal manometry measures the rhythmic muscle contractions (peristalsis) that occur in your esophagus when you swallow. Esophageal manometry also measures the coordination and force exerted by the muscles of your esophagus.  During esophageal manometry, a thin, flexible tube (catheter) that contains sensors is passed through your nose, down your esophagus and into your stomach. Esophageal manometry can be helpful in diagnosing some mostly uncommon disorders that affect your esophagus.  Why it's done Esophageal manometry is used to evaluate the movement (motility) of food through the esophagus and into the stomach. The test measures how well the circular  bands of muscle (sphincters) at the top and bottom of your esophagus open and close, as well as the pressure, strength and pattern of the wave of esophageal muscle contractions that moves food along.  What you can expect Esophageal manometry is an outpatient procedure done without sedation. Most people tolerate it well. You may be asked to change into a hospital gown before the test starts.  During esophageal manometry  While you are sitting up, a member of your health care team sprays your throat with a numbing medication or puts numbing gel in your nose or both.  A catheter is guided through your nose into your esophagus. The catheter may be sheathed in a water-filled sleeve. It doesn't interfere with your breathing. However, your eyes may water, and you may gag. You may have a slight nosebleed from irritation.  After the catheter is in place, you may be asked to lie on your back on an exam table, or you may be asked to remain seated.  You then swallow small sips of water. As you do, a computer connected to the catheter records the pressure, strength and pattern of your esophageal muscle contractions.  During the test, you'll be asked to breathe slowly and smoothly, remain as still as possible, and swallow only when you're asked to do so.  A member of your health care team may move the catheter down into your stomach while the catheter continues its measurements.  The catheter then is slowly withdrawn. The test usually lasts 20 to 30 minutes.  After esophageal manometry  When your esophageal manometry is complete, you may return to your normal activities  This test typically takes 30-45  minutes to complete.   If you are age 53 or older, your body mass index should be between 23-30. Your Body mass index is 29.75 kg/(m^2). If this is out of the aforementioned range listed, please consider follow up with your Primary Care Provider.  If you are age 59 or younger, your body mass index should be  between 19-25. Your Body mass index is 29.75 kg/(m^2). If this is out of the aformentioned range listed, please consider follow up with your Primary Care Provider.    Thank you for choosing [facility]  Dr Wilfrid Lund III

## 2015-10-18 NOTE — Progress Notes (Signed)
[facility] Progress Note  Chief Complaint: IBS and dysphagia  Subjective History:  This is a former patient of Dr. [name] with chronic diarrhea predominant IBS and GERD. She apparently takes when necessary antispasmodic, did not improve with empiric treatment of bacterial overgrowth, could not take Viberzi because of a previous history of pancreatitis, and was given a prescription for Lotronex in October 2016. She reports that her insurance would not cover it. She says the diarrhea seems to have improved somewhat on its own, but she still has 5-6 BMs per day with urgency, especially if she is eating outer shopping. She requires Dexilant daily to control pyrosis and regurgitation, she says several other PPIs have not worked. In addition, for the last few months she has had frequent episodes of "choking" which seems to mean food will feel stuck in the chest and she did bring it back up. She also reports that at nighttime she frequently awakens with regurgitation and feelings of having aspirated. She believes this led to her having pneumonia multiple times last year.   ROS: Cardiovascular:  no chest pain Respiratory: no dyspnea  The patient's Past Medical, Family and Social History were reviewed and are on file in the EMR.  Objective:  Med list reviewed  Vital signs in last 24 hrs: Filed Vitals:   10/18/15 1340  BP: 138/70  Pulse: 72    Physical Exam   HEENT: sclera anicteric, oral mucosa moist without lesions  Neck: supple, no thyromegaly, JVD or lymphadenopathy  Cardiac: RRR without murmurs, S1S2 heard, no peripheral edema  Pulm: clear to auscultation bilaterally, normal RR and effort noted  Abdomen: Overweight , soft, No tenderness, with active bowel sounds. No guarding or palpable hepatosplenomegaly.  Skin; warm and dry, no jaundice or rash   Radiologic studies:  Normal gastric emptying study last year nml barium swallow  06/2014  @ASSESSMENTPLANBEGIN @ Assessment: Encounter Diagnoses  Name Primary?  . IBS (irritable bowel syndrome)   . Gastroesophageal reflux disease without esophagitis   . Dysphagia, pharyngoesophageal phase Yes    Her IBS symptoms are stable, there are no other meds available for her. She feels she has been able to control it with diet. Her dysphagia is puzzling, I wonder if she may have a motility disorder such as esophageal spasm, less likely achalasia.  Plan:  After prior authorization, Dexilant will be refilled Esophageal motility study  Total 25 minutes time, over half spent reviewing previous records and discussing available treatments for GERD IBS and a workup for dysphagia. Nelida Meuse III

## 2015-10-21 ENCOUNTER — Encounter (HOSPITAL_COMMUNITY)
Admission: RE | Disposition: A | Discharge status: Home / Self Care | Payer: Self-pay | Source: Ambulatory Visit | Attending: Gastroenterology

## 2015-10-21 ENCOUNTER — Ambulatory Visit (HOSPITAL_COMMUNITY)
Admission: RE | Admit: 2015-10-21 | Discharge: 2015-10-21 | Disposition: A | Discharge status: Home / Self Care | Payer: Medicare HMO | Source: Ambulatory Visit | Attending: Gastroenterology | Admitting: Gastroenterology

## 2015-10-21 DIAGNOSIS — R1310 Dysphagia, unspecified: Secondary | ICD-10-CM | POA: Insufficient documentation

## 2015-10-21 HISTORY — PX: ESOPHAGEAL MANOMETRY: SHX5429

## 2015-10-21 SURGERY — MANOMETRY, ESOPHAGUS

## 2015-10-21 MED ORDER — LIDOCAINE VISCOUS 2 % MT SOLN
OROMUCOSAL | Status: AC
  Filled 2015-10-21: qty 15

## 2015-10-21 SURGICAL SUPPLY — 2 items
FACESHIELD LNG OPTICON STERILE (SAFETY) IMPLANT
GLOVE BIO SURGEON STRL SZ8 (GLOVE) ×4 IMPLANT

## 2015-10-21 NOTE — H&P (Signed)
Patient presented for esophageal motility study. See outpatient office note of last week.

## 2015-10-23 ENCOUNTER — Encounter (HOSPITAL_COMMUNITY): Payer: Self-pay | Admitting: Gastroenterology

## 2015-10-24 DIAGNOSIS — R1310 Dysphagia, unspecified: Secondary | ICD-10-CM | POA: Insufficient documentation

## 2015-10-29 ENCOUNTER — Encounter: Payer: Self-pay | Admitting: Gastroenterology

## 2015-10-29 MED ORDER — DEXLANSOPRAZOLE 60 MG PO CPDR: 60.0000 mg | DELAYED_RELEASE_CAPSULE | Freq: Every day | ORAL | Status: DC

## 2015-10-30 NOTE — Telephone Encounter (Signed)
Rx approved for Dexilant from 06-2015 to 06-2016. Patient and pharmacy notified.

## 2015-10-31 ENCOUNTER — Telehealth: Payer: Self-pay | Admitting: Gastroenterology

## 2015-10-31 ENCOUNTER — Ambulatory Visit: Payer: Medicare Other | Admitting: Internal Medicine

## 2015-10-31 NOTE — Telephone Encounter (Signed)
Pt has been scheduled for previsit and ENDO

## 2015-10-31 NOTE — Telephone Encounter (Signed)
[name],    Please let her know that the esophageal motility test was normal, so the cause of the swallowing trouble remains unclear. It is possible that she has esophageal spasm sometimes, but was not detected on the test if it did not occur that day.   [name] did an EGD Feb 2016 for dysphagia, and did a dilation.  If she recalls that it was helpful , then I would be willing to do an EGD to see if there is anything I can help with.

## 2015-11-06 ENCOUNTER — Encounter: Payer: Self-pay | Admitting: Cardiology

## 2015-11-06 ENCOUNTER — Ambulatory Visit (INDEPENDENT_AMBULATORY_CARE_PROVIDER_SITE_OTHER): Payer: Medicare HMO | Admitting: Cardiology

## 2015-11-06 VITALS — BP 98/50 | HR 63 | Ht 61.5 in | Wt 157.0 lb

## 2015-11-06 DIAGNOSIS — Z638 Other specified problems related to primary support group: Secondary | ICD-10-CM | POA: Diagnosis not present

## 2015-11-06 DIAGNOSIS — I10 Essential (primary) hypertension: Secondary | ICD-10-CM | POA: Diagnosis not present

## 2015-11-06 DIAGNOSIS — F439 Reaction to severe stress, unspecified: Secondary | ICD-10-CM

## 2015-11-06 DIAGNOSIS — K222 Esophageal obstruction: Secondary | ICD-10-CM

## 2015-11-06 DIAGNOSIS — E782 Mixed hyperlipidemia: Secondary | ICD-10-CM

## 2015-11-06 DIAGNOSIS — I25119 Atherosclerotic heart disease of native coronary artery with unspecified angina pectoris: Secondary | ICD-10-CM | POA: Diagnosis not present

## 2015-11-06 NOTE — Patient Instructions (Signed)
Your physician wants you to follow-up in: 6 months You will receive a reminder letter in the mail two months in advance. If you don't receive a letter, please call our office to schedule the follow-up appointment.     Your physician recommends that you continue on your current medications as directed. Please refer to the Current Medication list given to you today.    Dr Dorann Lodge   [phone]       Thank you for choosing [facility] !

## 2015-11-06 NOTE — Progress Notes (Signed)
Cardiology Office Note  Date: 11/06/2015   ID: [name], DOB [birth year], MRN [id number]  PCP: Gar Ponto, MD  Primary Cardiologist: Rozann Lesches, MD   Chief Complaint  Patient presents with  . Coronary Artery Disease    History of Present Illness: [name] is a 65 y.o. female last seen in October 2016. She presents for a routine follow-up visit. She tells me that she has been under stress at home, tends to have angina symptoms in this setting and uses nitroglycerin. Otherwise when she is out walking or doing other things that take her mind off stress, she does not report any angina.  I reviewed her medications. She continues on aspirin, Norvasc, Zetia, Hyzaar, and Lopressor.  I reviewed her ECG today which shows sinus rhythm with R' in lead V1 and V2.  She had lab work in February of this year, reviewed below. LDL was 115 at that time, her triglycerides have come down significantly. Hemoglobin A1c elevated at 9.0 however. She continues to follow with Dr. Quillian Quince.  Past Medical History  Diagnosis Date  . Essential hypertension, benign   . Mixed hyperlipidemia   . GERD (gastroesophageal reflux disease)   . Anxiety   . Type 2 diabetes mellitus ([place])   . IBS (irritable bowel syndrome)   . Asthma   . Fibromyalgia   . Pancreatitis 2008  . Barrett esophagus   . Coronary atherosclerosis of native coronary artery     DES to LAD May 2014  . Pneumonia 1990's  . Chronic bronchitis ([place])   . OSA on CPAP   . H/O hiatal hernia   . Migraines   . Arthritis   . Chronic lower back pain   . Diverticulosis   . Major depression in partial remission ([place])   . Fatty liver   . Chronic diarrhea     Past Surgical History  Procedure Laterality Date  . Toe fusion Right 10/11    First MTP joint  . Colonoscopy  10/13    Dr. Britta Mccreedy - tubular adenomas and diverticulosis  . Cholecystectomy  1990'S  . Appendectomy  ?1987  . Abdominal hysterectomy  ?1987  . Carpal tunnel  release Bilateral ?1990  . Cataract extraction w/ intraocular lens  implant, bilateral Bilateral ?2005  . Humerus fracture surgery Right 07/19/1969    "horst threw me" (12/09/2012)  . Bunionectomy Bilateral 1972  . Toe surgery Right 1990's    "toe next to big toe:  dr said I had tumor; cut bone & stuff; another OR dr stretched tendons, etc" (12/09/2012)  . Nasal septum surgery  1970's  . Dilation and curettage of uterus  1970's    "probably 3" (12/09/2012)  . Tubal ligation  1985  . Cesarean section  1985  . Ercp  2008  . Esophageal dilation  ` 2012  . Percutaneous coronary stent intervention (pci-s) N/A 12/09/2012    Procedure: PERCUTANEOUS CORONARY STENT INTERVENTION (PCI-S);  Surgeon: Burnell Blanks, MD;  Location: [facility];  Service: Cardiovascular;  Laterality: N/A;  . Esophageal manometry N/A 10/21/2015    Procedure: ESOPHAGEAL MANOMETRY (EM);  Surgeon: Doran Stabler, MD;  Location: [facility]OS[facility];  Service: Gastroenterology;  Laterality: N/A;    Current Outpatient Prescriptions  Medication Sig Dispense Refill  . ALPRAZolam (XANAX) 1 MG tablet TAKE 1 TABLET BY MOUTH AT BEDTIME AS NEEDED FOR ANXIETY  1  . amLODipine (NORVASC) 5 MG tablet Take 7.5 mg by mouth daily. Takes 7.5 mg  daily    . aspirin EC 81 MG tablet Take 81 mg by mouth daily.    Marland Kitchen DEXILANT 60 MG capsule Take 60 mg by mouth daily.     . DULoxetine (CYMBALTA) 60 MG capsule Take 60 mg by mouth daily.     Marland Kitchen ezetimibe (ZETIA) 10 MG tablet Take 10 mg by mouth daily.     Marland Kitchen gabapentin (NEURONTIN) 300 MG capsule Take 300 mg by mouth at bedtime.     Marland Kitchen HYDROcodone-acetaminophen (NORCO/VICODIN) 5-325 MG tablet TAKE 1 TABLET BY MOUTH FOUR TIMES A DAY FOR PAIN  0  . losartan-hydrochlorothiazide (HYZAAR) 100-25 MG tablet Take 1 tablet by mouth daily.     . metoprolol (LOPRESSOR) 50 MG tablet Take 50 mg by mouth 2 (two) times daily.     . montelukast (SINGULAIR) 10 MG tablet Take 10 mg by mouth at bedtime.     . traZODone  (DESYREL) 50 MG tablet Take 50 mg by mouth. 4 tabs hs    . TRESIBA FLEXTOUCH 200 UNIT/ML SOPN      No current facility-administered medications for this visit.   Allergies:  Erythromycin base; Azithromycin; Other; Pollen extract; Remeron; Tree extract; Zolpidem tartrate; and Sulfonamide derivatives   Social History: The patient  reports that she quit smoking about 22 years ago. Her smoking use included Cigarettes. She started smoking about 39 years ago. She has a 12 pack-year smoking history. She has never used smokeless tobacco. She reports that she does not drink alcohol or use illicit drugs.   ROS:  Please see the history of present illness. Otherwise, complete review of systems is positive for esophageal stricture pending EGD with dilatation.  All other systems are reviewed and negative.   Physical Exam: VS:  BP 98/50 mmHg  Pulse 63  Ht 5' 1.5" (1.562 m)  Wt 157 lb (71.215 kg)  BMI 29.19 kg/m2  SpO2 93%, BMI Body mass index is 29.19 kg/(m^2).  Wt Readings from Last 3 Encounters:  11/06/15 157 lb (71.215 kg)  10/18/15 160 lb (72.576 kg)  05/10/15 155 lb (70.308 kg)    Appears comfortable at rest.  HEENT: Conjunctiva and lids normal, oropharynx clear.  Neck: Supple, no elevated JVP or carotid bruits, no thyromegaly.  Lungs: Diminished but clear to auscultation, nonlabored breathing at rest.  Cardiac: Regular rate and rhythm with occasional ectopy, no S3 or significant systolic murmur, no pericardial rub.  Abdomen: Soft, nontender, bowel sounds present, no guarding or rebound. Extremities: No pitting edema, distal pulses 2+.  Skin: Warm and dry. Musculoskeletal: No kyphosis. Neuropsychiatric: Alert and oriented 3, affect appropriate.  ECG: I personally reviewed the prior tracing from 10/24/2014 which showed sinus rhythm with incomplete right bundle branch block.  Recent Labwork:  February 2017: Cholesterol 198, triglycerides 241, HDL 35, LDL 115, hemoglobin A1c 9.0, TSH  1.3, BUN 11, creatinine 0.6, potassium 4.1, AST 18, ALT 27  Other Studies Reviewed Today:  Carotid Dopplers 10/31/2014 showed 1-39% bilateral ICA stenoses.  Assessment and Plan:  1. CAD status post DES to the LAD in 2014. She is clinically stable other than angina symptoms associated with psychosocial stressors. ECG today reviewed and stable. We will continue medical therapy and observation for now. Consider follow-up ischemic testing within the next year.  2. Hyperlipidemia with statin intolerance. She remains on Zetia, recent LDL 115.  3. Essential hypertension, blood pressure is low normal today. Patient asymptomatic.  4. Reported esophageal stricture pending EGD with dilatation. Would be reasonable to hold aspirin if  required.  Current medicines were reviewed with the patient today.   Orders Placed This Encounter  Procedures  . EKG 12-Lead    Disposition: FU with me in 6 months.   Signed, [name], MD, [facility] 11/06/2015 1:24 PM    [facility] at [facility] [address], [place], [place] [address] Phone: [phone]; Fax: [phone]

## 2015-11-08 ENCOUNTER — Other Ambulatory Visit: Payer: Self-pay | Admitting: Cardiology

## 2015-11-08 ENCOUNTER — Ambulatory Visit (AMBULATORY_SURGERY_CENTER): Payer: Self-pay

## 2015-11-08 VITALS — Ht 62.0 in | Wt 160.0 lb

## 2015-11-08 DIAGNOSIS — R1319 Other dysphagia: Secondary | ICD-10-CM

## 2015-11-08 NOTE — Progress Notes (Signed)
No allergies to eggs or soy No home oxygen No diet meds No past problems with anesthesia  Has email and internet; registered for emmi  Some teeth are fragile due to erosion from stomach acid

## 2015-11-15 ENCOUNTER — Ambulatory Visit (AMBULATORY_SURGERY_CENTER): Payer: Medicare HMO | Admitting: Gastroenterology

## 2015-11-15 ENCOUNTER — Encounter: Payer: Self-pay | Admitting: Gastroenterology

## 2015-11-15 VITALS — BP 134/70 | HR 62 | Temp 98.6°F | Resp 17 | Ht 62.0 in | Wt 160.0 lb

## 2015-11-15 DIAGNOSIS — I10 Essential (primary) hypertension: Secondary | ICD-10-CM | POA: Diagnosis not present

## 2015-11-15 DIAGNOSIS — R1319 Other dysphagia: Secondary | ICD-10-CM | POA: Diagnosis not present

## 2015-11-15 DIAGNOSIS — R1310 Dysphagia, unspecified: Secondary | ICD-10-CM | POA: Diagnosis not present

## 2015-11-15 DIAGNOSIS — M797 Fibromyalgia: Secondary | ICD-10-CM | POA: Diagnosis not present

## 2015-11-15 DIAGNOSIS — E119 Type 2 diabetes mellitus without complications: Secondary | ICD-10-CM | POA: Diagnosis not present

## 2015-11-15 DIAGNOSIS — I2510 Atherosclerotic heart disease of native coronary artery without angina pectoris: Secondary | ICD-10-CM | POA: Diagnosis not present

## 2015-11-15 DIAGNOSIS — E669 Obesity, unspecified: Secondary | ICD-10-CM | POA: Diagnosis not present

## 2015-11-15 DIAGNOSIS — R69 Illness, unspecified: Secondary | ICD-10-CM | POA: Diagnosis not present

## 2015-11-15 DIAGNOSIS — G4733 Obstructive sleep apnea (adult) (pediatric): Secondary | ICD-10-CM | POA: Diagnosis not present

## 2015-11-15 LAB — GLUCOSE, CAPILLARY
GLUCOSE-CAPILLARY: 241 mg/dL — AB (ref 65–99)
Glucose-Capillary: 315 mg/dL — ABNORMAL HIGH (ref 65–99)

## 2015-11-15 MED ORDER — SODIUM CHLORIDE 0.9 % IV SOLN: 500.0000 mL | INTRAVENOUS | Status: DC

## 2015-11-15 NOTE — Progress Notes (Signed)
Report to PACU, RN, vss, BBS= Clear.  

## 2015-11-15 NOTE — Op Note (Signed)
[facility] Patient Name: [name] Procedure Date: 11/15/2015 7:36 AM MRN: [id number] Endoscopist: [place]. [name] , MD Age: 65 Date of Birth: [birth year] Gender: Female Procedure:                Upper GI endoscopy Indications:              Dysphagia Medicines:                Monitored Anesthesia Care Procedure:                Pre-Anesthesia Assessment:                           - Prior to the procedure, a History and Physical                            was performed, and patient medications and                            allergies were reviewed. The patient's tolerance of                            previous anesthesia was also reviewed. The risks                            and benefits of the procedure and the sedation                            options and risks were discussed with the patient.                            All questions were answered, and informed consent                            was obtained. Prior Anticoagulants: The patient has                            taken no previous anticoagulant or antiplatelet                            agents. ASA Grade Assessment: II - A patient with                            mild systemic disease. After reviewing the risks                            and benefits, the patient was deemed in                            satisfactory condition to undergo the procedure.                           After obtaining informed consent, the endoscope was  passed under direct vision. Throughout the                            procedure, the patient's blood pressure, pulse, and                            oxygen saturations were monitored continuously. The                            Model GIF-HQ190 [id number]) scope was introduced                            through the mouth, and advanced to the second part                            of duodenum. The upper GI endoscopy was                            accomplished  without difficulty. The patient                            tolerated the procedure well. Scope In: Scope Out: Findings:                 A small hiatal hernia was present.                           A mild (wdely patent) Schatzki ring was found in                            the lower third of the esophagus.                           The stomach was normal.                           The examined duodenum was normal.                           The cardia and gastric fundus were normal on                            retroflexion. Complications:            No immediate complications. Estimated Blood Loss:     Estimated blood loss: none. Impression:               - Small hiatal hernia.                           - Mild Schatzki ring.                           - Normal stomach.                           - Normal examined duodenum.                           -  No specimens collected. Recommendation:           - Patient has a contact number available for                            emergencies. The signs and symptoms of potential                            delayed complications were discussed with the                            patient. Return to normal activities tomorrow.                            Written discharge instructions were provided to the                            patient.                           - Resume previous diet.                           - Continue present medications. [name], MD 11/15/2015 8:14:30 AM This report has been signed electronically.

## 2015-11-15 NOTE — Patient Instructions (Addendum)
YOU HAD AN ENDOSCOPIC PROCEDURE TODAY AT [facility]:   Refer to the procedure report that was given to you for any specific questions about what was found during the examination.  If the procedure report does not answer your questions, please call your gastroenterologist to clarify.  If you requested that your care partner not be given the details of your procedure findings, then the procedure report has been included in a sealed envelope for you to review at your convenience later.  YOU SHOULD EXPECT: Some feelings of bloating in the abdomen. Passage of more gas than usual.  Walking can help get rid of the air that was put into your GI tract during the procedure and reduce the bloating. If you had a lower endoscopy (such as a colonoscopy or flexible sigmoidoscopy) you may notice spotting of blood in your stool or on the toilet paper. If you underwent a bowel prep for your procedure, you may not have a normal bowel movement for a few days.  Please Note:  You might notice some irritation and congestion in your nose or some drainage.  This is from the oxygen used during your procedure.  There is no need for concern and it should clear up in a day or so.  SYMPTOMS TO REPORT IMMEDIATELY:    Following upper endoscopy (EGD)  Vomiting of blood or coffee ground material  New chest pain or pain under the shoulder blades  Painful or persistently difficult swallowing  New shortness of breath  Fever of 100F or higher  Black, tarry-looking stools  For urgent or emergent issues, a gastroenterologist can be reached at any hour by calling [phone].   DIET: Your first meal following the procedure should be a small meal and then it is ok to progress to your normal diet. Heavy or fried foods are harder to digest and may make you feel nauseous or bloated.  Likewise, meals heavy in dairy and vegetables can increase bloating.  Drink plenty of fluids but you should avoid alcoholic beverages  for 24 hours.  ACTIVITY:  You should plan to take it easy for the rest of today and you should NOT DRIVE or use heavy machinery until tomorrow (because of the sedation medicines used during the test).    FOLLOW UP: Our staff will call the number listed on your records the next business day following your procedure to check on you and address any questions or concerns that you may have regarding the information given to you following your procedure. If we do not reach you, we will leave a message.  However, if you are feeling well and you are not experiencing any problems, there is no need to return our call.  We will assume that you have returned to your regular daily activities without incident.  If any biopsies were taken you will be contacted by phone or by letter within the next 1-3 weeks.  Please call us at [phone] if you have not heard about the biopsies in 3 weeks.    SIGNATURES/CONFIDENTIALITY: You and/or your care partner have signed paperwork which will be entered into your electronic medical record.  These signatures attest to the fact that that the information above on your After Visit Summary has been reviewed and is understood.  Full responsibility of the confidentiality of this discharge information lies with you and/or your care-partner.    Handout was given to your care partner on hiatal hernia. You may resume your current medications today.  Your blood sugar was 241 in the recovery room. Please call if any questions or concerns.

## 2015-11-15 NOTE — Progress Notes (Signed)
No problems noted in the recovery room. maw 

## 2015-11-18 ENCOUNTER — Telehealth: Payer: Self-pay

## 2015-11-18 NOTE — Telephone Encounter (Signed)
  Follow up Call-  Call back number 11/15/2015 11/20/2014 08/27/2014  Post procedure Call Back phone  # [phone]  Permission to leave phone message Yes Yes Yes     Patient questions:  Do you have a fever, pain , or abdominal swelling? No. Pain Score  0 *  Have you tolerated food without any problems? Yes.    Have you been able to return to your normal activities? Yes.    Do you have any questions about your discharge instructions: Diet   No. Medications  No. Follow up visit  No.  Do you have questions or concerns about your Care? No.  Actions: * If pain score is 4 or above: No action needed, pain <4.  No problems per the pt. maw

## 2015-11-20 DIAGNOSIS — R5383 Other fatigue: Secondary | ICD-10-CM | POA: Diagnosis not present

## 2015-11-20 DIAGNOSIS — I10 Essential (primary) hypertension: Secondary | ICD-10-CM | POA: Diagnosis not present

## 2015-11-20 DIAGNOSIS — E1151 Type 2 diabetes mellitus with diabetic peripheral angiopathy without gangrene: Secondary | ICD-10-CM | POA: Diagnosis not present

## 2015-11-20 DIAGNOSIS — K219 Gastro-esophageal reflux disease without esophagitis: Secondary | ICD-10-CM | POA: Diagnosis not present

## 2015-11-20 DIAGNOSIS — E782 Mixed hyperlipidemia: Secondary | ICD-10-CM | POA: Diagnosis not present

## 2015-11-26 DIAGNOSIS — I2510 Atherosclerotic heart disease of native coronary artery without angina pectoris: Secondary | ICD-10-CM | POA: Diagnosis not present

## 2015-11-26 DIAGNOSIS — R69 Illness, unspecified: Secondary | ICD-10-CM | POA: Diagnosis not present

## 2015-11-26 DIAGNOSIS — E1151 Type 2 diabetes mellitus with diabetic peripheral angiopathy without gangrene: Secondary | ICD-10-CM | POA: Diagnosis not present

## 2015-11-26 DIAGNOSIS — E782 Mixed hyperlipidemia: Secondary | ICD-10-CM | POA: Diagnosis not present

## 2015-11-26 DIAGNOSIS — I10 Essential (primary) hypertension: Secondary | ICD-10-CM | POA: Diagnosis not present

## 2015-11-26 DIAGNOSIS — Z1389 Encounter for screening for other disorder: Secondary | ICD-10-CM | POA: Diagnosis not present

## 2015-11-26 DIAGNOSIS — G4733 Obstructive sleep apnea (adult) (pediatric): Secondary | ICD-10-CM | POA: Diagnosis not present

## 2015-11-26 DIAGNOSIS — I6523 Occlusion and stenosis of bilateral carotid arteries: Secondary | ICD-10-CM | POA: Diagnosis not present

## 2016-01-20 ENCOUNTER — Ambulatory Visit (INDEPENDENT_AMBULATORY_CARE_PROVIDER_SITE_OTHER): Payer: Medicare HMO | Admitting: Internal Medicine

## 2016-01-20 ENCOUNTER — Telehealth: Payer: Self-pay | Admitting: Internal Medicine

## 2016-01-20 ENCOUNTER — Encounter: Payer: Self-pay | Admitting: Internal Medicine

## 2016-01-20 VITALS — BP 120/74 | HR 57 | Ht 61.0 in | Wt 160.0 lb

## 2016-01-20 DIAGNOSIS — K222 Esophageal obstruction: Secondary | ICD-10-CM

## 2016-01-20 DIAGNOSIS — G4733 Obstructive sleep apnea (adult) (pediatric): Secondary | ICD-10-CM

## 2016-01-20 NOTE — Assessment & Plan Note (Signed)
She describes problem as better controlled now with assistance of GI

## 2016-01-20 NOTE — Telephone Encounter (Signed)
Per the patient's request she wanted to use [name] in [place]. The order was faxed to them but they didn't think they were in network with her [employer] Medicare Ins. I called her insurance and there were only 2 DME companies in [place] [employer] [facility] We will wait and see what [facility] says about the coverage for the patient.

## 2016-01-20 NOTE — Patient Instructions (Addendum)
Order-  Needs new DME because of insurance, evaluate eligibility for replacement of old BiLevel machine,        Pressure 10/ 7, mask of choice, humidifier, supplies, AirView     Dx OSA   Please call as needed

## 2016-01-20 NOTE — Assessment & Plan Note (Signed)
She needs to establish with a new DME company because of insurance requirements. It sounds as if she may be eligible for replacement machine and this would be a good time to add Airview. She has been using bilevel 10/7 based on original management by Dr. Gwenette Greet. She has been compliant and it has definitely helped.

## 2016-01-20 NOTE — Progress Notes (Signed)
Subjective:    Patient ID: [name], female    DOB: 09-24-1950, 65 y.o.   MRN: [id number]  HPI  10/11/14- Dr Gwenette Greet The patient comes in today for follow-up of her known obstructive sleep apnea. At the last visit, she was having issues with nocturnal vomiting secondary to esophageal dysmotility. She has now been seen by gastroenterology, and feels that she is doing much better. He has restarted her bilevel device, but is overdue for supplies and is having mask fit issues. She also has gained 10 pounds since the last visit.  01/20/2016-65 year old female former smoker followed for OSA, complicated by DM 2, esophageal stricture/ hx aspiration pneumonia, IBS, HBP, CAD, Asthma  NPSG 2008 AHI 12/ hr BiLevel 10/7/ [facility] patient; Pt has had change in insurance and will need to change DME's from [facility] to a new one. Wears BiPAP nightly. No DL and not in AirView. Insurance is going to require her to change DME companies. Her current mask is very old and she is strapping on tightly to get a seal. We think the machine is more than 67 years old. Pressure is okay, but the mask discomfort is distracting so she doesn't pay attention to the pressure. Sometimes she can't keep it on more than 4 hours. She has had trouble with esophageal stricture and dysmotility, including at least one episode of aspiration pneumonia. When reflux has been more active she has CPAP off. She is working closely with GI on this and doing better. Asthma managed by her PCP is described as well controlled using Singulair and occasional rescue inhaler.  Review of Systems +=pos Constitutional: Negative for fever and unexpected weight change.  HENT: Negative for congestion, dental problem, ear pain, nosebleeds, postnasal drip, rhinorrhea, sinus pressure, sneezing, sore throat and trouble swallowing.   Eyes: Negative for redness and itching.  Respiratory: Negative for cough, chest tightness, shortness of breath and wheezing.     Cardiovascular: Negative for palpitations and leg swelling.  Gastrointestinal: Negative for nausea and vomiting. + reflux Genitourinary: Negative for dysuria.  Musculoskeletal: Negative for joint swelling.  Skin: Negative for rash.  Neurological: Negative for headaches.  Hematological: Does not bruise/bleed easily.  Psychiatric/Behavioral: Negative for dysphoric mood. The patient is not nervous/anxious.     Objective:   OBJ- Physical Exam General- Alert, Oriented, Affect-appropriate, Distress- none acute Skin- rash-none, lesions- none, excoriation- none Lymphadenopathy- none Head- atraumatic            Eyes- Gross vision intact, PERRLA, conjunctivae and secretions clear            Ears- Hearing, canals-normal            Nose- Clear, no-Septal dev, mucus, polyps, erosion, perforation             Throat- Mallampati III , mucosa clear , drainage- none, tonsils- atrophic Neck- flexible , trachea midline, no stridor , thyroid nl, carotid no bruit Chest - symmetrical excursion , unlabored           Heart/CV- RRR , no murmur , no gallop  , no rub, nl s1 s2                           - JVD- none , edema- none, stasis changes- none, varices- none           Lung- clear to P&A, wheeze- none, cough- none , dullness-none, rub- none  Chest wall-  Abd-  Br/ Gen/ Rectal- Not done, not indicated Extrem- cyanosis- none, clubbing, none, atrophy- none, strength- nl Neuro- grossly intact to observation    Assessment & Plan:

## 2016-01-30 DIAGNOSIS — G4733 Obstructive sleep apnea (adult) (pediatric): Secondary | ICD-10-CM | POA: Diagnosis not present

## 2016-02-06 ENCOUNTER — Other Ambulatory Visit: Payer: Self-pay | Admitting: Cardiology

## 2016-03-01 DIAGNOSIS — G4733 Obstructive sleep apnea (adult) (pediatric): Secondary | ICD-10-CM | POA: Diagnosis not present

## 2016-03-20 DIAGNOSIS — R5383 Other fatigue: Secondary | ICD-10-CM | POA: Diagnosis not present

## 2016-03-20 DIAGNOSIS — E119 Type 2 diabetes mellitus without complications: Secondary | ICD-10-CM | POA: Diagnosis not present

## 2016-03-20 DIAGNOSIS — I10 Essential (primary) hypertension: Secondary | ICD-10-CM | POA: Diagnosis not present

## 2016-03-20 DIAGNOSIS — K210 Gastro-esophageal reflux disease with esophagitis: Secondary | ICD-10-CM | POA: Diagnosis not present

## 2016-03-20 DIAGNOSIS — E782 Mixed hyperlipidemia: Secondary | ICD-10-CM | POA: Diagnosis not present

## 2016-03-25 DIAGNOSIS — Z0001 Encounter for general adult medical examination with abnormal findings: Secondary | ICD-10-CM | POA: Diagnosis not present

## 2016-03-25 DIAGNOSIS — Z6828 Body mass index (BMI) 28.0-28.9, adult: Secondary | ICD-10-CM | POA: Diagnosis not present

## 2016-03-25 DIAGNOSIS — R69 Illness, unspecified: Secondary | ICD-10-CM | POA: Diagnosis not present

## 2016-03-25 DIAGNOSIS — Z23 Encounter for immunization: Secondary | ICD-10-CM | POA: Diagnosis not present

## 2016-03-25 DIAGNOSIS — E782 Mixed hyperlipidemia: Secondary | ICD-10-CM | POA: Diagnosis not present

## 2016-03-25 DIAGNOSIS — G4733 Obstructive sleep apnea (adult) (pediatric): Secondary | ICD-10-CM | POA: Diagnosis not present

## 2016-03-25 DIAGNOSIS — Z1212 Encounter for screening for malignant neoplasm of rectum: Secondary | ICD-10-CM | POA: Diagnosis not present

## 2016-03-25 DIAGNOSIS — E1151 Type 2 diabetes mellitus with diabetic peripheral angiopathy without gangrene: Secondary | ICD-10-CM | POA: Diagnosis not present

## 2016-04-01 DIAGNOSIS — G4733 Obstructive sleep apnea (adult) (pediatric): Secondary | ICD-10-CM | POA: Diagnosis not present

## 2016-05-01 DIAGNOSIS — G4733 Obstructive sleep apnea (adult) (pediatric): Secondary | ICD-10-CM | POA: Diagnosis not present

## 2016-05-06 ENCOUNTER — Other Ambulatory Visit: Payer: Self-pay | Admitting: Cardiology

## 2016-05-15 DIAGNOSIS — G4733 Obstructive sleep apnea (adult) (pediatric): Secondary | ICD-10-CM | POA: Diagnosis not present

## 2016-05-26 ENCOUNTER — Encounter: Payer: Self-pay | Admitting: *Deleted

## 2016-05-26 DIAGNOSIS — Z961 Presence of intraocular lens: Secondary | ICD-10-CM | POA: Diagnosis not present

## 2016-05-26 NOTE — Progress Notes (Signed)
Cardiology Office Note  Date: 05/27/2016   ID: [name], DOB [birth year], MRN [id number]  PCP: Gar Ponto, MD  Primary Cardiologist: Rozann Lesches, MD   Chief Complaint  Patient presents with  . Coronary Artery Disease    History of Present Illness: [name] is a 65 y.o. female last seen in April. She presents for a routine follow-up visit. She does not report any significant angina symptoms and has had stable NYHA class II dyspnea.  I reviewed her medications which are outlined below. She continues on aspirin, Norvasc, Zerit, Hyzaar, and Lopressor. She has tolerated Zetia quite well and her lipid numbers have improved.  We discussed arranging a follow-up Myoview, coronary intervention was in 2014.  Past Medical History:  Diagnosis Date  . Anxiety   . Arthritis   . Asthma   . Barrett esophagus   . Chronic bronchitis ([place])   . Chronic diarrhea   . Chronic lower back pain   . Coronary atherosclerosis of native coronary artery    DES to LAD May 2014  . Diverticulosis   . Essential hypertension, benign   . Fatty liver   . Fibromyalgia   . GERD (gastroesophageal reflux disease)   . H/O hiatal hernia   . IBS (irritable bowel syndrome)   . Major depression in partial remission ([place])   . Migraines   . Mixed hyperlipidemia   . OSA on CPAP   . Pancreatitis 2008  . Pneumonia 1990's  . Type 2 diabetes mellitus ([place])     Past Surgical History:  Procedure Laterality Date  . ABDOMINAL HYSTERECTOMY  ?1987  . APPENDECTOMY  ?1987  . BUNIONECTOMY Bilateral 1972  . CARPAL TUNNEL RELEASE Bilateral ?1990  . CATARACT EXTRACTION W/ INTRAOCULAR LENS  IMPLANT, BILATERAL Bilateral ?2005  . CESAREAN SECTION  1985  . CHOLECYSTECTOMY  1990'S  . COLONOSCOPY  10/13   Dr. Britta Mccreedy - tubular adenomas and diverticulosis  . DILATION AND CURETTAGE OF UTERUS  1970's   "probably 3" (12/09/2012)  . ERCP  2008  . ESOPHAGEAL DILATION  ` 2012  . ESOPHAGEAL MANOMETRY N/A 10/21/2015   Procedure: ESOPHAGEAL MANOMETRY (EM);  Surgeon: Doran Stabler, MD;  Location: [facility];  Service: Gastroenterology;  Laterality: N/A;  . HUMERUS FRACTURE SURGERY Right 07/19/1969   "horst threw me" (12/09/2012)  . NASAL SEPTUM SURGERY  1970's  . PERCUTANEOUS CORONARY STENT INTERVENTION (PCI-S) N/A 12/09/2012   Procedure: PERCUTANEOUS CORONARY STENT INTERVENTION (PCI-S);  Surgeon: Burnell Blanks, MD;  Location: [facility] CATH LAB;  Service: Cardiovascular;  Laterality: N/A;  . TOE FUSION Right 10/11   First MTP joint  . TOE SURGERY Right 1990's   "toe next to big toe:  dr said I had tumor; cut bone & stuff; another OR dr stretched tendons, etc" (12/09/2012)  . TUBAL LIGATION  1985    Current Outpatient Prescriptions  Medication Sig Dispense Refill  . ALPRAZolam (XANAX) 1 MG tablet TAKE 1 TABLET BY MOUTH AT BEDTIME AS NEEDED FOR ANXIETY  1  . amLODipine (NORVASC) 5 MG tablet Take 7.5 mg by mouth daily. Takes 7.5 mg daily    . aspirin EC 81 MG tablet Take 81 mg by mouth daily.    Marland Kitchen DEXILANT 60 MG capsule Take 60 mg by mouth daily.     . DULoxetine (CYMBALTA) 60 MG capsule Take 60 mg by mouth daily.     Marland Kitchen ezetimibe (ZETIA) 10 MG tablet TAKE 1 TABLET BY MOUTH ONCE DAILY 90  tablet 3  . gabapentin (NEURONTIN) 300 MG capsule Take 300 mg by mouth at bedtime.     Marland Kitchen HYDROcodone-acetaminophen (NORCO/VICODIN) 5-325 MG tablet TAKE 1 TABLET BY MOUTH FOUR TIMES A DAY FOR PAIN  0  . losartan-hydrochlorothiazide (HYZAAR) 100-25 MG tablet Take 1 tablet by mouth daily.     . metoprolol (LOPRESSOR) 50 MG tablet TAKE 1 TABLET BY MOUTH TWICE DAILY 180 tablet 3  . montelukast (SINGULAIR) 10 MG tablet Take 10 mg by mouth at bedtime.     . promethazine (PHENERGAN) 25 MG tablet Take 25 mg by mouth every 6 (six) hours as needed for nausea or vomiting.    . traZODone (DESYREL) 50 MG tablet Take 200 mg by mouth at bedtime. 4 tabs hs    . TRESIBA FLEXTOUCH 200 UNIT/ML SOPN Inject 105 Units into the skin at  bedtime. As directed     No current facility-administered medications for this visit.    Allergies:  Erythromycin base; Azithromycin; Other; Pollen extract; Remeron [mirtazapine]; Tree extract; Zolpidem tartrate; and Sulfonamide derivatives   Social History: The patient  reports that she quit smoking about 22 years ago. Her smoking use included Cigarettes. She started smoking about 39 years ago. She has a 12.00 pack-year smoking history. She has never used smokeless tobacco. She reports that she does not drink alcohol or use drugs.   ROS:  Please see the history of present illness. Otherwise, complete review of systems is positive for none.  All other systems are reviewed and negative.   Physical Exam: VS:  BP 135/78   Pulse 66   Ht 5' 1.5" (1.562 m)   Wt 162 lb (73.5 kg)   BMI 30.11 kg/m , BMI Body mass index is 30.11 kg/m.  Wt Readings from Last 3 Encounters:  05/27/16 162 lb (73.5 kg)  01/20/16 160 lb (72.6 kg)  11/15/15 160 lb (72.6 kg)    Appears comfortable at rest.  HEENT: Conjunctiva and lids normal, oropharynx clear.  Neck: Supple, no elevated JVP or carotid bruits, no thyromegaly.  Lungs: Diminished but clear to auscultation, nonlabored breathing at rest.  Cardiac: Regular rate and rhythm with occasional ectopy, no S3 or significant systolic murmur, no pericardial rub.  Abdomen: Soft, nontender, bowel sounds present, no guarding or rebound.  Extremities: No pitting edema, distal pulses 2+. Skin: Warm and dry. Musculoskeletal: No kyphosis. Neuropsychiatric: Alert 3, affect appropriate.   ECG: I personally reviewed the tracing from 11/06/2015 which showed sinus rhythm with R' in V1 and V2.  Recent Labwork:  February 2017: Cholesterol 198, triglycerides 241, HDL 35, LDL 115, hemoglobin A1c 9.0, TSH 1.3, BUN 11, creatinine 0.6, potassium 4.1, AST 18, ALT 15 April 2016: Cholesterol 164, triglycerides 192, HDL 40, LDL 86, BUN 11, creatinine 0.7, potassium 4.3,  AST 19, ALT 26, TSH 2.1, hemoglobin A1c 7.7  Other Studies Reviewed Today:  Carotid Dopplers 10/31/2014: 1-39% bilateral ICA stenoses.  Assessment and Plan:  1. CAD status post DES to the LAD in May 2014. She does not report active angina. She has not had follow-up ischemic testing since her intervention and we will arrange a Lexiscan Myoview on medical therapy.  2. Hyperlipidemia with statin intolerance. She is doing well on Zetia and her most recent LDL was 86.  3. Type 2 diabetes mellitus, states that she is trying to lose weight and work on her glucose control. HgbA1c 7.7 down from 9.0 in February.  4. OSA on CPAP. Describes fatigue and difficulty with her mask fitting.  She just made recent adjustments and hopefully this will be effective.  Current medicines were reviewed with the patient today.   Orders Placed This Encounter  Procedures  . NM Myocar Multi W/Spect W/Wall Motion / EF  . Myocardial Perfusion Imaging    Disposition: Follow-up in 6 months.  Signed, [name], MD, [facility] 05/27/2016 2:44 PM    [facility] [place], [place], [place] [address] Phone: [phone]; Fax: [phone]

## 2016-05-27 ENCOUNTER — Ambulatory Visit (INDEPENDENT_AMBULATORY_CARE_PROVIDER_SITE_OTHER): Payer: Medicare HMO | Admitting: Cardiology

## 2016-05-27 ENCOUNTER — Encounter: Payer: Self-pay | Admitting: Cardiology

## 2016-05-27 ENCOUNTER — Encounter: Payer: Self-pay | Admitting: *Deleted

## 2016-05-27 VITALS — BP 135/78 | HR 66 | Ht 61.5 in | Wt 162.0 lb

## 2016-05-27 DIAGNOSIS — E782 Mixed hyperlipidemia: Secondary | ICD-10-CM

## 2016-05-27 DIAGNOSIS — I25119 Atherosclerotic heart disease of native coronary artery with unspecified angina pectoris: Secondary | ICD-10-CM | POA: Diagnosis not present

## 2016-05-27 DIAGNOSIS — G4733 Obstructive sleep apnea (adult) (pediatric): Secondary | ICD-10-CM | POA: Diagnosis not present

## 2016-05-27 DIAGNOSIS — E1159 Type 2 diabetes mellitus with other circulatory complications: Secondary | ICD-10-CM | POA: Diagnosis not present

## 2016-05-27 DIAGNOSIS — Z9989 Dependence on other enabling machines and devices: Secondary | ICD-10-CM

## 2016-05-27 NOTE — Patient Instructions (Signed)
Your physician wants you to follow-up in: 6 MONTHS WITH DR. MCDOWELL You will receive a reminder letter in the mail two months in advance. If you don't receive a letter, please call our office to schedule the follow-up appointment.  Your physician recommends that you continue on your current medications as directed. Please refer to the Current Medication list given to you today.  Your physician has requested that you have a lexiscan myoview. For further information please visit [email or web address]. Please follow instruction sheet, as given.  Thank you for choosing [facility]!!    

## 2016-06-05 ENCOUNTER — Encounter (HOSPITAL_COMMUNITY)
Admission: RE | Admit: 2016-06-05 | Discharge: 2016-06-05 | Disposition: A | Discharge status: Home / Self Care | Payer: Medicare HMO | Source: Ambulatory Visit | Attending: Cardiology | Admitting: Cardiology

## 2016-06-05 ENCOUNTER — Inpatient Hospital Stay (HOSPITAL_COMMUNITY): Admission: RE | Admit: 2016-06-05 | Payer: Medicare HMO | Source: Ambulatory Visit

## 2016-06-05 DIAGNOSIS — I25119 Atherosclerotic heart disease of native coronary artery with unspecified angina pectoris: Secondary | ICD-10-CM | POA: Insufficient documentation

## 2016-06-05 LAB — NM MYOCAR MULTI W/SPECT W/WALL MOTION / EF
CSEPPHR: 72 {beats}/min
LVDIAVOL: 55 mL (ref 46–106)
LVSYSVOL: 16 mL
RATE: 0.31
Rest HR: 58 {beats}/min
SDS: 0
SRS: 3
SSS: 3
TID: 1.18

## 2016-06-05 MED ORDER — SODIUM CHLORIDE 0.9% FLUSH
INTRAVENOUS | Status: AC
  Administered 2016-06-05: 10 mL via INTRAVENOUS
  Filled 2016-06-05: qty 10

## 2016-06-05 MED ORDER — TECHNETIUM TC 99M TETROFOSMIN IV KIT
30.0000 | PACK | Freq: Once | INTRAVENOUS | Status: AC | PRN
  Administered 2016-06-05: 31.5 via INTRAVENOUS

## 2016-06-05 MED ORDER — TECHNETIUM TC 99M TETROFOSMIN IV KIT
10.0000 | PACK | Freq: Once | INTRAVENOUS | Status: AC | PRN
  Administered 2016-06-05: 10.6 via INTRAVENOUS

## 2016-06-05 MED ORDER — REGADENOSON 0.4 MG/5ML IV SOLN
INTRAVENOUS | Status: AC
  Administered 2016-06-05: 0.4 mg via INTRAVENOUS
  Filled 2016-06-05: qty 5

## 2016-06-10 ENCOUNTER — Telehealth: Payer: Self-pay | Admitting: *Deleted

## 2016-06-10 ENCOUNTER — Telehealth: Payer: Self-pay | Admitting: Cardiology

## 2016-06-10 NOTE — Telephone Encounter (Signed)
Pt aware routed to pcp  Notes Recorded by [name], MD on 06/05/2016 at 3:12 PM EST Results reviewed. Stress test turned out well, low risk without any significant ischemia. We will continue medical therapy and observation. A copy of this test should be forwarded to Gar Ponto, MD.

## 2016-06-10 NOTE — Telephone Encounter (Signed)
Pt called and would like to know the results from her testing please give her a call on her cell [phone]

## 2016-06-10 NOTE — Telephone Encounter (Signed)
Pt given results of Nuclear study,copied pcp

## 2016-07-01 DIAGNOSIS — G4733 Obstructive sleep apnea (adult) (pediatric): Secondary | ICD-10-CM | POA: Diagnosis not present

## 2016-07-21 DIAGNOSIS — R5383 Other fatigue: Secondary | ICD-10-CM | POA: Diagnosis not present

## 2016-07-21 DIAGNOSIS — E782 Mixed hyperlipidemia: Secondary | ICD-10-CM | POA: Diagnosis not present

## 2016-07-21 DIAGNOSIS — K210 Gastro-esophageal reflux disease with esophagitis: Secondary | ICD-10-CM | POA: Diagnosis not present

## 2016-07-21 DIAGNOSIS — E119 Type 2 diabetes mellitus without complications: Secondary | ICD-10-CM | POA: Diagnosis not present

## 2016-07-21 DIAGNOSIS — I10 Essential (primary) hypertension: Secondary | ICD-10-CM | POA: Diagnosis not present

## 2016-07-23 DIAGNOSIS — I2510 Atherosclerotic heart disease of native coronary artery without angina pectoris: Secondary | ICD-10-CM | POA: Diagnosis not present

## 2016-07-23 DIAGNOSIS — E1151 Type 2 diabetes mellitus with diabetic peripheral angiopathy without gangrene: Secondary | ICD-10-CM | POA: Diagnosis not present

## 2016-07-23 DIAGNOSIS — E782 Mixed hyperlipidemia: Secondary | ICD-10-CM | POA: Diagnosis not present

## 2016-07-23 DIAGNOSIS — G4733 Obstructive sleep apnea (adult) (pediatric): Secondary | ICD-10-CM | POA: Diagnosis not present

## 2016-07-23 DIAGNOSIS — E1165 Type 2 diabetes mellitus with hyperglycemia: Secondary | ICD-10-CM | POA: Diagnosis not present

## 2016-07-23 DIAGNOSIS — I10 Essential (primary) hypertension: Secondary | ICD-10-CM | POA: Diagnosis not present

## 2016-07-23 DIAGNOSIS — Z6829 Body mass index (BMI) 29.0-29.9, adult: Secondary | ICD-10-CM | POA: Diagnosis not present

## 2016-07-23 DIAGNOSIS — R69 Illness, unspecified: Secondary | ICD-10-CM | POA: Diagnosis not present

## 2016-08-01 DIAGNOSIS — G4733 Obstructive sleep apnea (adult) (pediatric): Secondary | ICD-10-CM | POA: Diagnosis not present

## 2016-09-01 DIAGNOSIS — G4733 Obstructive sleep apnea (adult) (pediatric): Secondary | ICD-10-CM | POA: Diagnosis not present

## 2016-09-03 DIAGNOSIS — M810 Age-related osteoporosis without current pathological fracture: Secondary | ICD-10-CM | POA: Diagnosis not present

## 2016-09-03 DIAGNOSIS — E119 Type 2 diabetes mellitus without complications: Secondary | ICD-10-CM | POA: Diagnosis not present

## 2016-09-03 DIAGNOSIS — M549 Dorsalgia, unspecified: Secondary | ICD-10-CM | POA: Diagnosis not present

## 2016-09-03 DIAGNOSIS — E28319 Asymptomatic premature menopause: Secondary | ICD-10-CM | POA: Diagnosis not present

## 2016-09-03 DIAGNOSIS — Z7984 Long term (current) use of oral hypoglycemic drugs: Secondary | ICD-10-CM | POA: Diagnosis not present

## 2016-09-03 DIAGNOSIS — Z7982 Long term (current) use of aspirin: Secondary | ICD-10-CM | POA: Diagnosis not present

## 2016-09-03 DIAGNOSIS — E7800 Pure hypercholesterolemia, unspecified: Secondary | ICD-10-CM | POA: Diagnosis not present

## 2016-09-03 DIAGNOSIS — Z87891 Personal history of nicotine dependence: Secondary | ICD-10-CM | POA: Diagnosis not present

## 2016-09-03 DIAGNOSIS — M85852 Other specified disorders of bone density and structure, left thigh: Secondary | ICD-10-CM | POA: Diagnosis not present

## 2016-09-03 DIAGNOSIS — I10 Essential (primary) hypertension: Secondary | ICD-10-CM | POA: Diagnosis not present

## 2016-09-03 DIAGNOSIS — M2550 Pain in unspecified joint: Secondary | ICD-10-CM | POA: Diagnosis not present

## 2016-09-09 DIAGNOSIS — G4733 Obstructive sleep apnea (adult) (pediatric): Secondary | ICD-10-CM | POA: Diagnosis not present

## 2016-09-29 DIAGNOSIS — G4733 Obstructive sleep apnea (adult) (pediatric): Secondary | ICD-10-CM | POA: Diagnosis not present

## 2016-10-22 DIAGNOSIS — I25111 Atherosclerotic heart disease of native coronary artery with angina pectoris with documented spasm: Secondary | ICD-10-CM | POA: Diagnosis not present

## 2016-10-22 DIAGNOSIS — I10 Essential (primary) hypertension: Secondary | ICD-10-CM | POA: Diagnosis not present

## 2016-10-22 DIAGNOSIS — E1151 Type 2 diabetes mellitus with diabetic peripheral angiopathy without gangrene: Secondary | ICD-10-CM | POA: Diagnosis not present

## 2016-10-22 DIAGNOSIS — E1165 Type 2 diabetes mellitus with hyperglycemia: Secondary | ICD-10-CM | POA: Diagnosis not present

## 2016-10-22 DIAGNOSIS — E782 Mixed hyperlipidemia: Secondary | ICD-10-CM | POA: Diagnosis not present

## 2016-10-28 DIAGNOSIS — I2510 Atherosclerotic heart disease of native coronary artery without angina pectoris: Secondary | ICD-10-CM | POA: Diagnosis not present

## 2016-10-28 DIAGNOSIS — I10 Essential (primary) hypertension: Secondary | ICD-10-CM | POA: Diagnosis not present

## 2016-10-28 DIAGNOSIS — G4733 Obstructive sleep apnea (adult) (pediatric): Secondary | ICD-10-CM | POA: Diagnosis not present

## 2016-10-28 DIAGNOSIS — I6523 Occlusion and stenosis of bilateral carotid arteries: Secondary | ICD-10-CM | POA: Diagnosis not present

## 2016-10-28 DIAGNOSIS — R69 Illness, unspecified: Secondary | ICD-10-CM | POA: Diagnosis not present

## 2016-10-28 DIAGNOSIS — E1151 Type 2 diabetes mellitus with diabetic peripheral angiopathy without gangrene: Secondary | ICD-10-CM | POA: Diagnosis not present

## 2016-10-28 DIAGNOSIS — E782 Mixed hyperlipidemia: Secondary | ICD-10-CM | POA: Diagnosis not present

## 2016-10-28 DIAGNOSIS — Z23 Encounter for immunization: Secondary | ICD-10-CM | POA: Diagnosis not present

## 2016-10-30 DIAGNOSIS — G4733 Obstructive sleep apnea (adult) (pediatric): Secondary | ICD-10-CM | POA: Diagnosis not present

## 2016-11-02 ENCOUNTER — Other Ambulatory Visit: Payer: Self-pay | Admitting: Cardiology

## 2016-11-18 DIAGNOSIS — Z6829 Body mass index (BMI) 29.0-29.9, adult: Secondary | ICD-10-CM | POA: Diagnosis not present

## 2016-11-18 DIAGNOSIS — J0101 Acute recurrent maxillary sinusitis: Secondary | ICD-10-CM | POA: Diagnosis not present

## 2016-11-23 DIAGNOSIS — J019 Acute sinusitis, unspecified: Secondary | ICD-10-CM | POA: Diagnosis not present

## 2016-11-23 DIAGNOSIS — J209 Acute bronchitis, unspecified: Secondary | ICD-10-CM | POA: Diagnosis not present

## 2016-11-23 DIAGNOSIS — R69 Illness, unspecified: Secondary | ICD-10-CM | POA: Diagnosis not present

## 2016-11-23 DIAGNOSIS — Z6830 Body mass index (BMI) 30.0-30.9, adult: Secondary | ICD-10-CM | POA: Diagnosis not present

## 2016-11-29 DIAGNOSIS — G4733 Obstructive sleep apnea (adult) (pediatric): Secondary | ICD-10-CM | POA: Diagnosis not present

## 2016-12-30 DIAGNOSIS — G4733 Obstructive sleep apnea (adult) (pediatric): Secondary | ICD-10-CM | POA: Diagnosis not present

## 2017-01-07 DIAGNOSIS — G4733 Obstructive sleep apnea (adult) (pediatric): Secondary | ICD-10-CM | POA: Diagnosis not present

## 2017-01-08 ENCOUNTER — Telehealth: Payer: Self-pay

## 2017-01-08 ENCOUNTER — Other Ambulatory Visit: Payer: Self-pay

## 2017-01-08 MED ORDER — DEXILANT 60 MG PO CPDR: 60.0000 mg | DELAYED_RELEASE_CAPSULE | Freq: Every day | ORAL | 1 refills | Status: DC

## 2017-01-08 NOTE — Telephone Encounter (Signed)
Refilled as directed by Dr Danis  

## 2017-01-08 NOTE — Telephone Encounter (Signed)
Incoming fax request for refills on Dexilant 60 mg one a day.Pt. Has a follow up on 02-18-2017.

## 2017-01-08 NOTE — Telephone Encounter (Signed)
Yes, it can be refilled for one month and a refill

## 2017-01-22 DIAGNOSIS — W57XXXA Bitten or stung by nonvenomous insect and other nonvenomous arthropods, initial encounter: Secondary | ICD-10-CM | POA: Diagnosis not present

## 2017-01-22 DIAGNOSIS — M797 Fibromyalgia: Secondary | ICD-10-CM | POA: Diagnosis not present

## 2017-01-22 DIAGNOSIS — E782 Mixed hyperlipidemia: Secondary | ICD-10-CM | POA: Diagnosis not present

## 2017-01-22 DIAGNOSIS — I10 Essential (primary) hypertension: Secondary | ICD-10-CM | POA: Diagnosis not present

## 2017-01-22 DIAGNOSIS — K210 Gastro-esophageal reflux disease with esophagitis: Secondary | ICD-10-CM | POA: Diagnosis not present

## 2017-01-22 DIAGNOSIS — R69 Illness, unspecified: Secondary | ICD-10-CM | POA: Diagnosis not present

## 2017-01-22 DIAGNOSIS — R5383 Other fatigue: Secondary | ICD-10-CM | POA: Diagnosis not present

## 2017-01-22 DIAGNOSIS — E1165 Type 2 diabetes mellitus with hyperglycemia: Secondary | ICD-10-CM | POA: Diagnosis not present

## 2017-01-22 DIAGNOSIS — Z9189 Other specified personal risk factors, not elsewhere classified: Secondary | ICD-10-CM | POA: Diagnosis not present

## 2017-01-22 DIAGNOSIS — G4733 Obstructive sleep apnea (adult) (pediatric): Secondary | ICD-10-CM | POA: Diagnosis not present

## 2017-01-22 DIAGNOSIS — R599 Enlarged lymph nodes, unspecified: Secondary | ICD-10-CM | POA: Diagnosis not present

## 2017-01-25 DIAGNOSIS — E1151 Type 2 diabetes mellitus with diabetic peripheral angiopathy without gangrene: Secondary | ICD-10-CM | POA: Diagnosis not present

## 2017-01-25 DIAGNOSIS — M797 Fibromyalgia: Secondary | ICD-10-CM | POA: Diagnosis not present

## 2017-01-25 DIAGNOSIS — I2510 Atherosclerotic heart disease of native coronary artery without angina pectoris: Secondary | ICD-10-CM | POA: Diagnosis not present

## 2017-01-25 DIAGNOSIS — I10 Essential (primary) hypertension: Secondary | ICD-10-CM | POA: Diagnosis not present

## 2017-01-25 DIAGNOSIS — K580 Irritable bowel syndrome with diarrhea: Secondary | ICD-10-CM | POA: Diagnosis not present

## 2017-01-25 DIAGNOSIS — E782 Mixed hyperlipidemia: Secondary | ICD-10-CM | POA: Diagnosis not present

## 2017-01-25 DIAGNOSIS — I6523 Occlusion and stenosis of bilateral carotid arteries: Secondary | ICD-10-CM | POA: Diagnosis not present

## 2017-01-25 DIAGNOSIS — Z6829 Body mass index (BMI) 29.0-29.9, adult: Secondary | ICD-10-CM | POA: Diagnosis not present

## 2017-01-29 DIAGNOSIS — G4733 Obstructive sleep apnea (adult) (pediatric): Secondary | ICD-10-CM | POA: Diagnosis not present

## 2017-01-30 ENCOUNTER — Other Ambulatory Visit: Payer: Self-pay | Admitting: Cardiology

## 2017-02-08 NOTE — Progress Notes (Deleted)
Cardiology Office Note  Date: 02/08/2017   ID: [name], DOB 1951/06/30, MRN [id number]  PCP: Caryl Bis, MD  Primary Cardiologist: Rozann Lesches, MD   No chief complaint on file.   History of Present Illness: [name] is a 66 y.o. female last seen in November 2017.  Past Medical History:  Diagnosis Date  . Anxiety   . Arthritis   . Asthma   . Barrett esophagus   . Chronic bronchitis ([place])   . Chronic diarrhea   . Chronic lower back pain   . Coronary atherosclerosis of native coronary artery    DES to LAD May 2014  . Diverticulosis   . Essential hypertension, benign   . Fatty liver   . Fibromyalgia   . GERD (gastroesophageal reflux disease)   . H/O hiatal hernia   . IBS (irritable bowel syndrome)   . Major depression in partial remission ([place])   . Migraines   . Mixed hyperlipidemia   . OSA on CPAP   . Pancreatitis 2008  . Pneumonia 1990's  . Type 2 diabetes mellitus ([place])     Past Surgical History:  Procedure Laterality Date  . ABDOMINAL HYSTERECTOMY  ?1987  . APPENDECTOMY  ?1987  . BUNIONECTOMY Bilateral 1972  . CARPAL TUNNEL RELEASE Bilateral ?1990  . CATARACT EXTRACTION W/ INTRAOCULAR LENS  IMPLANT, BILATERAL Bilateral ?2005  . CESAREAN SECTION  1985  . CHOLECYSTECTOMY  1990'S  . COLONOSCOPY  10/13   Dr. Britta Mccreedy - tubular adenomas and diverticulosis  . DILATION AND CURETTAGE OF UTERUS  1970's   "probably 3" (12/09/2012)  . ERCP  2008  . ESOPHAGEAL DILATION  ` 2012  . ESOPHAGEAL MANOMETRY N/A 10/21/2015   Procedure: ESOPHAGEAL MANOMETRY (EM);  Surgeon: Doran Stabler, MD;  Location: [facility];  Service: Gastroenterology;  Laterality: N/A;  . HUMERUS FRACTURE SURGERY Right 07/19/1969   "horst threw me" (12/09/2012)  . NASAL SEPTUM SURGERY  1970's  . PERCUTANEOUS CORONARY STENT INTERVENTION (PCI-S) N/A 12/09/2012   Procedure: PERCUTANEOUS CORONARY STENT INTERVENTION (PCI-S);  Surgeon: Burnell Blanks, MD;  Location: [facility] CATH LAB;   Service: Cardiovascular;  Laterality: N/A;  . TOE FUSION Right 10/11   First MTP joint  . TOE SURGERY Right 1990's   "toe next to big toe:  dr said I had tumor; cut bone & stuff; another OR dr stretched tendons, etc" (12/09/2012)  . TUBAL LIGATION  1985    Current Outpatient Prescriptions  Medication Sig Dispense Refill  . ALPRAZolam (XANAX) 1 MG tablet TAKE 1 TABLET BY MOUTH AT BEDTIME AS NEEDED FOR ANXIETY  1  . amLODipine (NORVASC) 5 MG tablet Take 7.5 mg by mouth daily. Takes 7.5 mg daily    . amLODipine (NORVASC) 5 MG tablet TAKE 1 & 1/2 TABLETS BY MOUTH EVERY DAY 135 tablet 3  . aspirin EC 81 MG tablet Take 81 mg by mouth daily.    Marland Kitchen DEXILANT 60 MG capsule Take 1 capsule (60 mg total) by mouth daily. 30 capsule 1  . DULoxetine (CYMBALTA) 60 MG capsule Take 60 mg by mouth daily.     Marland Kitchen ezetimibe (ZETIA) 10 MG tablet TAKE 1 TABLET BY MOUTH ONCE DAILY 90 tablet 3  . gabapentin (NEURONTIN) 300 MG capsule Take 300 mg by mouth at bedtime.     Marland Kitchen HYDROcodone-acetaminophen (NORCO/VICODIN) 5-325 MG tablet TAKE 1 TABLET BY MOUTH FOUR TIMES A DAY FOR PAIN  0  . losartan-hydrochlorothiazide (HYZAAR) 100-25 MG tablet Take  1 tablet by mouth daily.     . metoprolol tartrate (LOPRESSOR) 50 MG tablet TAKE 1 TABLET BY MOUTH TWICE DAILY 180 tablet 0  . montelukast (SINGULAIR) 10 MG tablet Take 10 mg by mouth at bedtime.     . promethazine (PHENERGAN) 25 MG tablet Take 25 mg by mouth every 6 (six) hours as needed for nausea or vomiting.    . traZODone (DESYREL) 50 MG tablet Take 200 mg by mouth at bedtime. 4 tabs hs    . TRESIBA FLEXTOUCH 200 UNIT/ML SOPN Inject 105 Units into the skin at bedtime. As directed     No current facility-administered medications for this visit.    Allergies:  Erythromycin base; Azithromycin; Other; Pollen extract; Remeron [mirtazapine]; Tree extract; Zolpidem tartrate; and Sulfonamide derivatives   Social History: The patient  reports that she quit smoking about 23 years  ago. Her smoking use included Cigarettes. She started smoking about 40 years ago. She has a 12.00 pack-year smoking history. She has never used smokeless tobacco. She reports that she does not drink alcohol or use drugs.   Family History: The patient's family history includes Cirrhosis in her father; Colon polyps in her sister; Esophageal cancer in her sister; Hypertension in her father; Prostate cancer in her brother; Stroke in her mother.   ROS:  Please see the history of present illness. Otherwise, complete review of systems is positive for {NONE DEFAULTED:18576::"none"}.  All other systems are reviewed and negative.   Physical Exam: VS:  There were no vitals taken for this visit., BMI There is no height or weight on file to calculate BMI.  Wt Readings from Last 3 Encounters:  05/27/16 162 lb (73.5 kg)  01/20/16 160 lb (72.6 kg)  11/15/15 160 lb (72.6 kg)    Appears comfortable at rest.  HEENT: Conjunctiva and lids normal, oropharynx clear.  Neck: Supple, no elevated JVP or carotid bruits, no thyromegaly.  Lungs: Diminished but clear to auscultation, nonlabored breathing at rest.  Cardiac: Regular rate and rhythm with occasional ectopy, no S3 or significant systolic murmur, no pericardial rub.  Abdomen: Soft, nontender, bowel sounds present, no guarding or rebound.  Extremities: No pitting edema, distal pulses 2+. Skin: Warm and dry. Musculoskeletal: No kyphosis. Neuropsychiatric: Alert 3, affect appropriate.   ECG: I personally reviewed the tracing from 11/06/2015 which showed sinus rhythm with R' in V1 and V2.  Recent Labwork:  September 2017: Cholesterol 164, triglycerides 192, HDL 40, LDL 86, BUN 11, creatinine 0.7, potassium 4.3, AST 19, ALT 26, TSH 2.1, hemoglobin A1c 7.7  Other Studies Reviewed Today:  Lexiscan Myoview 06/05/2016:  No diagnostic ST segment changes to indicate ischemia.  Small, mild intensity, apical anterior defect consistent with breast  attenuation. No reversible defects to indicate ischemia.  This is a low risk study.  Nuclear stress EF: 71%.  Assessment and Plan:    Current medicines were reviewed with the patient today.  No orders of the defined types were placed in this encounter.   Disposition:  Signed, [name], MD, [facility] 02/08/2017 2:01 PM    [facility] at [place], [place], [place] [address] Phone: [phone]; Fax: [phone]

## 2017-02-09 ENCOUNTER — Ambulatory Visit: Payer: Medicare HMO | Admitting: Cardiology

## 2017-02-11 DIAGNOSIS — Z23 Encounter for immunization: Secondary | ICD-10-CM | POA: Diagnosis not present

## 2017-02-11 DIAGNOSIS — I10 Essential (primary) hypertension: Secondary | ICD-10-CM | POA: Diagnosis not present

## 2017-02-18 ENCOUNTER — Ambulatory Visit (INDEPENDENT_AMBULATORY_CARE_PROVIDER_SITE_OTHER): Payer: Medicare HMO | Admitting: Gastroenterology

## 2017-02-18 ENCOUNTER — Encounter: Payer: Self-pay | Admitting: Gastroenterology

## 2017-02-18 VITALS — BP 158/68 | HR 58 | Ht 61.5 in | Wt 165.1 lb

## 2017-02-18 DIAGNOSIS — K580 Irritable bowel syndrome with diarrhea: Secondary | ICD-10-CM

## 2017-02-18 DIAGNOSIS — R1310 Dysphagia, unspecified: Secondary | ICD-10-CM

## 2017-02-18 DIAGNOSIS — K219 Gastro-esophageal reflux disease without esophagitis: Secondary | ICD-10-CM

## 2017-02-18 DIAGNOSIS — R1319 Other dysphagia: Secondary | ICD-10-CM

## 2017-02-18 NOTE — Patient Instructions (Signed)
If you are age 68 or older, your body mass index should be between 23-30. Your Body mass index is 30.69 kg/m. If this is out of the aforementioned range listed, please consider follow up with your Primary Care Provider.  If you are age 15 or younger, your body mass index should be between 19-25. Your Body mass index is 30.69 kg/m. If this is out of the aformentioned range listed, please consider follow up with your Primary Care Provider.   Follow up as needed.  Thank you for choosing [facility]  Dr Wilfrid Lund III

## 2017-02-18 NOTE — Progress Notes (Signed)
     [facility] Progress Note  Chief Complaint: Dysphagia  Subjective  History:  [name] was here for the first time since her upper endoscopy in April 2017. She continues to have the same symptoms of GERD, intermittent dysphagia, generalized abdominal pain and diarrhea. She carries a diagnosis of IBS, see my March 2017 office note for details. Sometimes she will have postprandial bowel movements, especially after eating at a restaurant. Many foods such as fried things or spaghetti sauce will aggravate her heartburn and dysphagia. She might have the sensation of things stuck in the chest even just drinking liquids. Of note, her motility study was normal April 2017. EGD at that time showed a widely patent Schatzki ring, no dilation was performed.  ROS: Cardiovascular:  no chest pain Respiratory: no dyspnea  The patient's Past Medical, Family and Social History were reviewed and are on file in the EMR.  Objective:  Med list reviewed  Vital signs in last 24 hrs: Vitals:   02/18/17 1359  BP: (!) 158/68  Pulse: (!) 58    Physical Exam    HEENT: sclera anicteric, oral mucosa moist without lesions  Neck: supple, no thyromegaly, JVD or lymphadenopathy  Cardiac: RRR without murmurs, S1S2 heard, no peripheral edema  Pulm: clear to auscultation bilaterally, normal RR and effort noted  Abdomen: soft, Mild generalized tenderness to light palpation, with active bowel sounds. No guarding or palpable hepatosplenomegaly.  Skin; warm and dry, no jaundice or rash  Recent Labs:  CBC, CMP nml X glu 131, Hgb A1C 8.1 nml TSH   @ASSESSMENTPLANBEGIN @ Assessment: Encounter Diagnoses  Name Primary?  . Irritable bowel syndrome with diarrhea Yes  . Esophageal dysphagia   . Gastroesophageal reflux disease without esophagitis    Chronic stable symptoms, we discussed limitations of available medications. She was reluctant to take Imodium out of concern that might cause her to be  "stopped up". I think it would be safe to take this on occasion prophylactically if she is traveling or going out to eat. I think she is likely to have ongoing functional issues due to her fibromyalgia and anxiety disorder.   Plan:  I will see her as needed. Dexilant was refilled since seems to work reasonably well to decrease her heartburn.  Total time 20 minutes, over half spent in counseling and coordination of care.   Nelida Meuse III

## 2017-02-19 ENCOUNTER — Other Ambulatory Visit: Payer: Self-pay | Admitting: Gastroenterology

## 2017-04-06 DIAGNOSIS — I10 Essential (primary) hypertension: Secondary | ICD-10-CM | POA: Diagnosis not present

## 2017-04-06 DIAGNOSIS — E782 Mixed hyperlipidemia: Secondary | ICD-10-CM | POA: Diagnosis not present

## 2017-04-06 DIAGNOSIS — R69 Illness, unspecified: Secondary | ICD-10-CM | POA: Diagnosis not present

## 2017-04-06 DIAGNOSIS — E1151 Type 2 diabetes mellitus with diabetic peripheral angiopathy without gangrene: Secondary | ICD-10-CM | POA: Diagnosis not present

## 2017-04-06 DIAGNOSIS — Z9189 Other specified personal risk factors, not elsewhere classified: Secondary | ICD-10-CM | POA: Diagnosis not present

## 2017-04-06 DIAGNOSIS — K210 Gastro-esophageal reflux disease with esophagitis: Secondary | ICD-10-CM | POA: Diagnosis not present

## 2017-04-06 DIAGNOSIS — E1165 Type 2 diabetes mellitus with hyperglycemia: Secondary | ICD-10-CM | POA: Diagnosis not present

## 2017-04-09 DIAGNOSIS — J301 Allergic rhinitis due to pollen: Secondary | ICD-10-CM | POA: Diagnosis not present

## 2017-04-09 DIAGNOSIS — Z23 Encounter for immunization: Secondary | ICD-10-CM | POA: Diagnosis not present

## 2017-04-09 DIAGNOSIS — R69 Illness, unspecified: Secondary | ICD-10-CM | POA: Diagnosis not present

## 2017-04-09 DIAGNOSIS — G4733 Obstructive sleep apnea (adult) (pediatric): Secondary | ICD-10-CM | POA: Diagnosis not present

## 2017-04-09 DIAGNOSIS — I6523 Occlusion and stenosis of bilateral carotid arteries: Secondary | ICD-10-CM | POA: Diagnosis not present

## 2017-04-09 DIAGNOSIS — I2510 Atherosclerotic heart disease of native coronary artery without angina pectoris: Secondary | ICD-10-CM | POA: Diagnosis not present

## 2017-04-09 DIAGNOSIS — I10 Essential (primary) hypertension: Secondary | ICD-10-CM | POA: Diagnosis not present

## 2017-04-09 DIAGNOSIS — Z0001 Encounter for general adult medical examination with abnormal findings: Secondary | ICD-10-CM | POA: Diagnosis not present

## 2017-04-09 DIAGNOSIS — K580 Irritable bowel syndrome with diarrhea: Secondary | ICD-10-CM | POA: Diagnosis not present

## 2017-04-09 DIAGNOSIS — Z9189 Other specified personal risk factors, not elsewhere classified: Secondary | ICD-10-CM | POA: Diagnosis not present

## 2017-04-09 DIAGNOSIS — K219 Gastro-esophageal reflux disease without esophagitis: Secondary | ICD-10-CM | POA: Diagnosis not present

## 2017-04-09 DIAGNOSIS — E782 Mixed hyperlipidemia: Secondary | ICD-10-CM | POA: Diagnosis not present

## 2017-04-09 DIAGNOSIS — E1151 Type 2 diabetes mellitus with diabetic peripheral angiopathy without gangrene: Secondary | ICD-10-CM | POA: Diagnosis not present

## 2017-04-18 ENCOUNTER — Encounter: Payer: Self-pay | Admitting: Internal Medicine

## 2017-04-19 ENCOUNTER — Ambulatory Visit (INDEPENDENT_AMBULATORY_CARE_PROVIDER_SITE_OTHER): Payer: Medicare HMO | Admitting: Internal Medicine

## 2017-04-19 ENCOUNTER — Encounter: Payer: Self-pay | Admitting: Internal Medicine

## 2017-04-19 VITALS — BP 116/74 | HR 61 | Ht 61.0 in | Wt 159.8 lb

## 2017-04-19 DIAGNOSIS — G4733 Obstructive sleep apnea (adult) (pediatric): Secondary | ICD-10-CM | POA: Diagnosis not present

## 2017-04-19 NOTE — Patient Instructions (Addendum)
Order- DME [facility]   Please help patient with mask fit,  mask of choice, supplies, humidifier, AirView   Dx OSA                                If this machine permits, please change settings to auto 5-15 CPAP mode for trial    Please call if we can help

## 2017-04-19 NOTE — Progress Notes (Signed)
Subjective:    Patient ID: [name], female    DOB: [birth year], 66 y.o.   MRN: [id number]  HPI  female former smoker followed for OSA, complicated by DM 2, esophageal stricture/ hx aspiration pneumonia, IBS, HBP, CAD, Asthma,  NPSG 2008 AHI 12/ hr  ----------------------------------------------------------------------------------------------------------------------  01/20/2016-66 year old female former smoker followed for OSA, complicated by DM 2, esophageal stricture/ hx aspiration pneumonia, IBS, HBP, CAD, Asthma  NPSG 2008 AHI 12/ hr BiLevel 10/7/ [place] Former [facility] patient; Pt has had change in insurance and will need to change DME's from [facility] to a new one. Wears BiPAP nightly. No DL and not in AirView. Insurance is going to require her to change DME companies. Her current mask is very old and she is strapping on tightly to get a seal. We think the machine is more than 68 years old. Pressure is okay, but the mask discomfort is distracting so she doesn't pay attention to the pressure. Sometimes she can't keep it on more than 4 hours. She has had trouble with esophageal stricture and dysmotility, including at least one episode of aspiration pneumonia. When reflux has been more active she has CPAP off. She is working closely with GI on this and doing better. Asthma managed by her PCP is described as well controlled using Singulair and occasional rescue inhaler.  04/19/17- 66 year old female former smoker followed for OSA, complicated by DM 2, esophageal stricture/ hx aspiration pneumonia, IBS, HBP, CAD, Asthma  VPAP 12/7/ PS 5   [facility] FOLLOWS FOR: DME: [place]. Pt wears BiPAP nightly and DL attached.  She has had trouble communicating with her DME company and getting service. Respiratory therapy staff are only available part-time. She is not breathing as well with her machine and thinks the mask is a problem with frequent leaks. Has to stretch it on too tightly. Download reflects  her difficulties with compliance now but shows good control, AHI 3.3/hour.  Review of Systems +=pos Constitutional: Negative for fever and unexpected weight change.  HENT: Negative for congestion, dental problem, ear pain, nosebleeds, postnasal drip, rhinorrhea, sinus pressure, sneezing, sore throat and trouble swallowing.   Eyes: Negative for redness and itching.  Respiratory: Negative for cough, chest tightness, shortness of breath and wheezing.   Cardiovascular: Negative for palpitations and leg swelling.  Gastrointestinal: Negative for nausea and vomiting. + reflux Genitourinary: Negative for dysuria.  Musculoskeletal: Negative for joint swelling.  Skin: Negative for rash.  Neurological: Negative for headaches.  Hematological: Does not bruise/bleed easily.  Psychiatric/Behavioral: Negative for dysphoric mood. The patient is not nervous/anxious.     Objective:   OBJ- Physical Exam General- Alert, Oriented, Affect-appropriate, Distress- none acute Skin- rash-none, lesions- none, excoriation- none Lymphadenopathy- none Head- atraumatic            Eyes- Gross vision intact, PERRLA, conjunctivae and secretions clear            Ears- Hearing, canals-normal            Nose- Clear, no-Septal dev, mucus, polyps, erosion, perforation             Throat- Mallampati III , mucosa clear , drainage- none, tonsils- atrophic Neck- flexible , trachea midline, no stridor , thyroid nl, carotid no bruit Chest - symmetrical excursion , unlabored           Heart/CV- RRR , no murmur , no gallop  , no rub, nl s1 s2                           -  JVD- none , edema- none, stasis changes- none, varices- none           Lung- clear to P&A, wheeze- none, cough- none , dullness-none, rub- none           Chest wall-  Abd-  Br/ Gen/ Rectal- Not done, not indicated Extrem- cyanosis- none, clubbing, none, atrophy- none, strength- nl Neuro- grossly intact to observation    Assessment & Plan:

## 2017-04-19 NOTE — Assessment & Plan Note (Signed)
I'm not sure that she needs bilevel. If her current machine can take the settings, I would like to change her to auto 5-15. She is going to talk to the manager of her DME company about her problems with communication and support.

## 2017-04-21 NOTE — Progress Notes (Signed)
Cardiology Office Note  Date: 04/23/2017   ID: [name], DOB 02-21-1951, MRN [id number]  PCP: Caryl Bis, MD  Primary Cardiologist: Rozann Lesches, MD   Chief Complaint  Patient presents with  . Coronary Artery Disease    History of Present Illness: [name] is a 66 y.o. female last seen in November 2017. She presents for a routine follow-up visit. Since last encounter she does not report any significant angina symptoms, reports NYHA class II dyspnea. She has had no palpitations or syncope. She has been watching her diet, cut back on carbohydrates significantly. She is trying to lose some weight and also better managing her glucose.  I reviewed her recent lab work from September per Dr. Quillian Quince which is outlined below.  We went over her medications. Cardiac regimen includes aspirin, Norvasc, said he had, Hyzaar, and Lopressor. Her blood pressure is normal today.  I personally reviewed her ECG today which shows sinus bradycardia with R' in lead V1 and V2.  Past Medical History:  Diagnosis Date  . Anxiety   . Arthritis   . Asthma   . Barrett esophagus   . Chronic bronchitis ([place])   . Chronic diarrhea   . Chronic lower back pain   . Coronary atherosclerosis of native coronary artery    DES to LAD May 2014  . Diverticulosis   . Essential hypertension, benign   . Fatty liver   . Fibromyalgia   . GERD (gastroesophageal reflux disease)   . H/O hiatal hernia   . IBS (irritable bowel syndrome)   . Major depression in partial remission ([place])   . Migraines   . Mixed hyperlipidemia   . OSA on CPAP   . Pancreatitis 2008  . Pneumonia 1990's  . Type 2 diabetes mellitus ([place])     Past Surgical History:  Procedure Laterality Date  . ABDOMINAL HYSTERECTOMY  ?1987  . APPENDECTOMY  ?1987  . BUNIONECTOMY Bilateral 1972  . CARPAL TUNNEL RELEASE Bilateral ?1990  . CATARACT EXTRACTION W/ INTRAOCULAR LENS  IMPLANT, BILATERAL Bilateral ?2005  . CESAREAN SECTION  1985    . CHOLECYSTECTOMY  1990'S  . COLONOSCOPY  10/13   Dr. Britta Mccreedy - tubular adenomas and diverticulosis  . DILATION AND CURETTAGE OF UTERUS  1970's   "probably 3" (12/09/2012)  . ERCP  2008  . ESOPHAGEAL DILATION  ` 2012  . ESOPHAGEAL MANOMETRY N/A 10/21/2015   Procedure: ESOPHAGEAL MANOMETRY (EM);  Surgeon: Doran Stabler, MD;  Location: [facility];  Service: Gastroenterology;  Laterality: N/A;  . HUMERUS FRACTURE SURGERY Right 07/19/1969   "horst threw me" (12/09/2012)  . NASAL SEPTUM SURGERY  1970's  . PERCUTANEOUS CORONARY STENT INTERVENTION (PCI-S) N/A 12/09/2012   Procedure: PERCUTANEOUS CORONARY STENT INTERVENTION (PCI-S);  Surgeon: Burnell Blanks, MD;  Location: [facility] CATH LAB;  Service: Cardiovascular;  Laterality: N/A;  . TOE FUSION Right 10/11   First MTP joint  . TOE SURGERY Right 1990's   "toe next to big toe:  dr said I had tumor; cut bone & stuff; another OR dr stretched tendons, etc" (12/09/2012)  . TUBAL LIGATION  1985    Current Outpatient Prescriptions  Medication Sig Dispense Refill  . ALPRAZolam (XANAX) 1 MG tablet TAKE 1 TABLET BY MOUTH AT BEDTIME AS NEEDED FOR ANXIETY  1  . amLODipine (NORVASC) 5 MG tablet TAKE 1 & 1/2 TABLETS BY MOUTH EVERY DAY 135 tablet 3  . aspirin EC 81 MG tablet Take 81 mg by  mouth daily.    . DULoxetine (CYMBALTA) 60 MG capsule Take 60 mg by mouth daily.     Mar[place] Kitchen ezetimibe (ZETIA) 10 MG tablet TAKE 1 TABLET BY MOUTH ONCE DAILY 90 tablet 3  . HYDROcodone-acetaminophen (NORCO/VICODIN) 5-325 MG tablet as needed  0  . losartan-hydrochlorothiazide (HYZAAR) 100-25 MG tablet Take 1 tablet by mouth daily.     . metoprolol tartrate (LOPRESSOR) 50 MG tablet TAKE 1 TABLET BY MOUTH TWICE DAILY 180 tablet 0  . montelukast (SINGULAIR) 10 MG tablet Take 10 mg by mouth at bedtime.     . promethazine (PHENERGAN) 25 MG tablet Take 25 mg by mouth every 6 (six) hours as needed for nausea or vomiting.    . traZODone (DESYREL) 50 MG tablet Take 200 mg by mouth  at bedtime. 4 tabs hs    . TRESIBA FLEXTOUCH 200 UNIT/ML SOPN Inject 130 Units into the skin at bedtime. As directed     No current facility-administered medications for this visit.    Allergies:  Erythromycin base; Azithromycin; Other; Pollen extract; Remeron [mirtazapine]; Tree extract; Zolpidem tartrate; and Sulfonamide derivatives   Social History: The patient  reports that she quit smoking about 23 years ago. Her smoking use included Cigarettes. She started smoking about 40 years ago. She has a 12.00 pack-year smoking history. She has never used smokeless tobacco. She reports that she does not drink alcohol or use drugs.   ROS:  Please see the history of present illness. Otherwise, complete review of systems is positive for occasional lightheadedness in the mornings, intermittent headaches.  All other systems are reviewed and negative.   Physical Exam: VS:  BP 122/62   Pulse (!) 57   Ht 5\' 2"  (1.575 m)   Wt 162 lb (73.5 kg)   SpO2 98%   BMI 29.63 kg/m , BMI Body mass index is 29.63 kg/m.  Wt Readings from Last 3 Encounters:  04/23/17 162 lb (73.5 kg)  04/19/17 159 lb 12.8 oz (72.5 kg)  02/18/17 165 lb 2 oz (74.9 kg)    General: Patient appears comfortable at rest. HEENT: Conjunctiva and lids normal, oropharynx clear. Neck: Supple, no elevated JVP or carotid bruits, no thyromegaly. Lungs: Clear to auscultation, nonlabored breathing at rest. Cardiac: Regular rate and rhythm, no S3 or significant systolic murmur, no pericardial rub. Abdomen: Soft, nontender, bowel sounds present, no guarding or rebound. Extremities: No pitting edema, distal pulses 2+. Skin: Warm and dry. Musculoskeletal: No kyphosis. Neuropsychiatric: Alert and oriented x3, affect grossly appropriate.  ECG: I personally reviewed the tracing from 11/06/2015 which showed sinus rhythm with R' in lead V1 and V2.  Recent Labwork:  September 2017: Cholesterol 164, triglycerides 192, HDL 40, LDL 86, BUN 11,  creatinine 0.67, potassium 4.3, AST 19, ALT 26, TSH 2.12, hemoglobin A1c 7.26 March 2017: BUN 13, creatinine 0.73, potassium 4.1, AST 28, ALT 33, cholesterol 170, triglycerides 144, HDL 49, LDL 92  Other Studies Reviewed Today:  Lexiscan Myoview 06/05/2016:  No diagnostic ST segment changes to indicate ischemia.  Small, mild intensity, apical anterior defect consistent with breast attenuation. No reversible defects to indicate ischemia.  This is a low risk study.  Nuclear stress EF: 71%.  Assessment and Plan:  1. Symptomatically stable CAD status post DES to the LAD in 2014. She had a follow-up Lexiscan Myoview last year which was low risk as outlined above and does not report any angina symptoms on medical therapy. ECG reviewed and stable. Continue with observation.  2. Hyperlipidemia with  statin intolerance, doing fairly well onset he had. I reviewed her recent lab work per Dr. Quillian Quince. Diet and exercise will also be helpful.  3. OSA on CPAP. Reports compliance.  4. Type 2 diabetes mellitus, followed by Dr. Quillian Quince. She is cutting back carbohydrates in addition to medical therapy.  5. Mild bilateral carotid artery disease as of 2016. We will obtain a follow-up carotid Doppler year.  Current medicines were reviewed with the patient today.   Orders Placed This Encounter  Procedures  . US Carotid Duplex Bilateral  . EKG 12-Lead    Disposition: Follow-up in one year.  Signed, [name], MD, [facility] 04/23/2017 12:06 PM    [facility] [address], [place], [place] [address] Phone: [phone]; Fax: [phone]

## 2017-04-23 ENCOUNTER — Encounter: Payer: Self-pay | Admitting: Cardiology

## 2017-04-23 ENCOUNTER — Ambulatory Visit (INDEPENDENT_AMBULATORY_CARE_PROVIDER_SITE_OTHER): Payer: Medicare HMO | Admitting: Cardiology

## 2017-04-23 VITALS — BP 122/62 | HR 57 | Ht 62.0 in | Wt 162.0 lb

## 2017-04-23 DIAGNOSIS — I6523 Occlusion and stenosis of bilateral carotid arteries: Secondary | ICD-10-CM

## 2017-04-23 DIAGNOSIS — I25119 Atherosclerotic heart disease of native coronary artery with unspecified angina pectoris: Secondary | ICD-10-CM

## 2017-04-23 DIAGNOSIS — Z9989 Dependence on other enabling machines and devices: Secondary | ICD-10-CM | POA: Diagnosis not present

## 2017-04-23 DIAGNOSIS — G4733 Obstructive sleep apnea (adult) (pediatric): Secondary | ICD-10-CM | POA: Diagnosis not present

## 2017-04-23 DIAGNOSIS — E782 Mixed hyperlipidemia: Secondary | ICD-10-CM | POA: Diagnosis not present

## 2017-04-23 DIAGNOSIS — E118 Type 2 diabetes mellitus with unspecified complications: Secondary | ICD-10-CM | POA: Diagnosis not present

## 2017-04-23 NOTE — Addendum Note (Signed)
Addended by: Acquanetta Chain on: 04/23/2017 12:12 PM   Modules accepted: Orders

## 2017-04-23 NOTE — Patient Instructions (Signed)
Medication Instructions:  Your physician recommends that you continue on your current medications as directed. Please refer to the Current Medication list given to you today.  Labwork: NONE  Testing/Procedures: Your physician has requested that you have a carotid duplex. This test is an ultrasound of the carotid arteries in your neck. It looks at blood flow through these arteries that supply the brain with blood. Allow one hour for this exam. There are no restrictions or special instructions.  Follow-Up: Your physician wants you to follow-up in: 1 YEAR WITH DR. MCDOWELL. You will receive a reminder letter in the mail two months in advance. If you don't receive a letter, please call our office to schedule the follow-up appointment.  Any Other Special Instructions Will Be Listed Below (If Applicable).  If you need a refill on your cardiac medications before your next appointment, please call your pharmacy. 

## 2017-04-29 ENCOUNTER — Other Ambulatory Visit: Payer: Self-pay | Admitting: Cardiology

## 2017-06-01 DIAGNOSIS — E119 Type 2 diabetes mellitus without complications: Secondary | ICD-10-CM | POA: Diagnosis not present

## 2017-06-01 DIAGNOSIS — H524 Presbyopia: Secondary | ICD-10-CM | POA: Diagnosis not present

## 2017-06-01 DIAGNOSIS — Z961 Presence of intraocular lens: Secondary | ICD-10-CM | POA: Diagnosis not present

## 2017-06-01 DIAGNOSIS — Z7984 Long term (current) use of oral hypoglycemic drugs: Secondary | ICD-10-CM | POA: Diagnosis not present

## 2017-07-03 DIAGNOSIS — M797 Fibromyalgia: Secondary | ICD-10-CM | POA: Diagnosis not present

## 2017-07-03 DIAGNOSIS — R509 Fever, unspecified: Secondary | ICD-10-CM | POA: Diagnosis not present

## 2017-07-03 DIAGNOSIS — Z6829 Body mass index (BMI) 29.0-29.9, adult: Secondary | ICD-10-CM | POA: Diagnosis not present

## 2017-07-18 DIAGNOSIS — M797 Fibromyalgia: Secondary | ICD-10-CM | POA: Diagnosis not present

## 2017-07-18 DIAGNOSIS — Z79899 Other long term (current) drug therapy: Secondary | ICD-10-CM | POA: Diagnosis not present

## 2017-07-18 DIAGNOSIS — E7800 Pure hypercholesterolemia, unspecified: Secondary | ICD-10-CM | POA: Diagnosis not present

## 2017-07-18 DIAGNOSIS — L03811 Cellulitis of head [any part, except face]: Secondary | ICD-10-CM | POA: Diagnosis not present

## 2017-07-18 DIAGNOSIS — I10 Essential (primary) hypertension: Secondary | ICD-10-CM | POA: Diagnosis not present

## 2017-07-18 DIAGNOSIS — Z794 Long term (current) use of insulin: Secondary | ICD-10-CM | POA: Diagnosis not present

## 2017-07-18 DIAGNOSIS — R51 Headache: Secondary | ICD-10-CM | POA: Diagnosis not present

## 2017-07-18 DIAGNOSIS — M1990 Unspecified osteoarthritis, unspecified site: Secondary | ICD-10-CM | POA: Diagnosis not present

## 2017-07-18 DIAGNOSIS — K219 Gastro-esophageal reflux disease without esophagitis: Secondary | ICD-10-CM | POA: Diagnosis not present

## 2017-07-18 DIAGNOSIS — E119 Type 2 diabetes mellitus without complications: Secondary | ICD-10-CM | POA: Diagnosis not present

## 2017-07-21 DIAGNOSIS — Z6828 Body mass index (BMI) 28.0-28.9, adult: Secondary | ICD-10-CM | POA: Diagnosis not present

## 2017-07-21 DIAGNOSIS — L03221 Cellulitis of neck: Secondary | ICD-10-CM | POA: Diagnosis not present

## 2017-07-21 DIAGNOSIS — G43009 Migraine without aura, not intractable, without status migrainosus: Secondary | ICD-10-CM | POA: Diagnosis not present

## 2017-07-28 DIAGNOSIS — G319 Degenerative disease of nervous system, unspecified: Secondary | ICD-10-CM | POA: Diagnosis not present

## 2017-07-28 DIAGNOSIS — R51 Headache: Secondary | ICD-10-CM | POA: Diagnosis not present

## 2017-07-28 DIAGNOSIS — I6782 Cerebral ischemia: Secondary | ICD-10-CM | POA: Diagnosis not present

## 2017-08-25 ENCOUNTER — Encounter: Payer: Self-pay | Admitting: Cardiology

## 2017-08-25 DIAGNOSIS — R5383 Other fatigue: Secondary | ICD-10-CM | POA: Diagnosis not present

## 2017-08-25 DIAGNOSIS — E1165 Type 2 diabetes mellitus with hyperglycemia: Secondary | ICD-10-CM | POA: Diagnosis not present

## 2017-08-25 DIAGNOSIS — G4733 Obstructive sleep apnea (adult) (pediatric): Secondary | ICD-10-CM | POA: Diagnosis not present

## 2017-08-25 DIAGNOSIS — I10 Essential (primary) hypertension: Secondary | ICD-10-CM | POA: Diagnosis not present

## 2017-08-25 DIAGNOSIS — E782 Mixed hyperlipidemia: Secondary | ICD-10-CM | POA: Diagnosis not present

## 2017-08-30 DIAGNOSIS — I10 Essential (primary) hypertension: Secondary | ICD-10-CM | POA: Diagnosis not present

## 2017-08-30 DIAGNOSIS — I2510 Atherosclerotic heart disease of native coronary artery without angina pectoris: Secondary | ICD-10-CM | POA: Diagnosis not present

## 2017-08-30 DIAGNOSIS — E1151 Type 2 diabetes mellitus with diabetic peripheral angiopathy without gangrene: Secondary | ICD-10-CM | POA: Diagnosis not present

## 2017-08-30 DIAGNOSIS — E782 Mixed hyperlipidemia: Secondary | ICD-10-CM | POA: Diagnosis not present

## 2017-08-30 DIAGNOSIS — K580 Irritable bowel syndrome with diarrhea: Secondary | ICD-10-CM | POA: Diagnosis not present

## 2017-08-30 DIAGNOSIS — M797 Fibromyalgia: Secondary | ICD-10-CM | POA: Diagnosis not present

## 2017-08-30 DIAGNOSIS — E1165 Type 2 diabetes mellitus with hyperglycemia: Secondary | ICD-10-CM | POA: Diagnosis not present

## 2017-08-30 DIAGNOSIS — I6523 Occlusion and stenosis of bilateral carotid arteries: Secondary | ICD-10-CM | POA: Diagnosis not present

## 2017-09-04 DIAGNOSIS — Z6829 Body mass index (BMI) 29.0-29.9, adult: Secondary | ICD-10-CM | POA: Diagnosis not present

## 2017-09-04 DIAGNOSIS — L03811 Cellulitis of head [any part, except face]: Secondary | ICD-10-CM | POA: Diagnosis not present

## 2017-11-01 ENCOUNTER — Telehealth: Payer: Self-pay | Admitting: Cardiology

## 2017-11-01 NOTE — Telephone Encounter (Signed)
Fatigue, stated that she doesn't sleep good.  Wakes up in the middle of the night throwing up.  Has had to have her esophagus stretched several times.  Now mostly is dry heaves.  Chest pain - more to left side.  Sharp pain that comes & goes.  Does have dizziness occasionally, but that is about the same.  Just started doing some Tai Chi exercises.  Also, wants to know if okay to do these exercises.  Stretching with rolling necks & turning necks a lot and wants to make sure this is okay.  Does get a little SOB with walking to mail box, but this is not new.  Patient last seen in October, did have doppler ordered at that [facility], but was never scheduled.  Will send message to [facility] for scheduling as patient would like to go ahead and get this done.    4/14 - 176/83  72 4/13 - 186/87  79  8:00 pm 4/12- 165/78  68  6:00 pm 4/11 - 177/85  70  10:00 pm  4/10 - 141/90  70  7:30 pm

## 2017-11-01 NOTE — Telephone Encounter (Signed)
Patient called stating that she has been fatigue for awhile now.  States that her BP is elevated with headaches. She started doing Tai-Chia . She is concerned about having a stroke.

## 2017-11-02 NOTE — Telephone Encounter (Signed)
She may do Tai Chi. Go ahead and update carotid Dopplers as I already recommended. She should follow-up with PCP Dr. Quillian Quince for management of blood pressure.

## 2017-11-02 NOTE — Telephone Encounter (Signed)
Patient notified and verbalized understanding. 

## 2017-11-03 ENCOUNTER — Ambulatory Visit (INDEPENDENT_AMBULATORY_CARE_PROVIDER_SITE_OTHER): Payer: Medicare HMO

## 2017-11-03 ENCOUNTER — Other Ambulatory Visit: Payer: Self-pay | Admitting: Cardiology

## 2017-11-03 DIAGNOSIS — I6523 Occlusion and stenosis of bilateral carotid arteries: Secondary | ICD-10-CM

## 2017-11-05 ENCOUNTER — Telehealth: Payer: Self-pay | Admitting: *Deleted

## 2017-11-05 NOTE — Telephone Encounter (Signed)
Patient informed and copy sent to PCP. 

## 2017-11-05 NOTE — Telephone Encounter (Signed)
-----   Message from [name], MD sent at 11/04/2017  9:12 AM EDT ----- Results reviewed.  Only mild bilateral ICA atherosclerosis as before, 1-39% range.  Continue with medical therapy. A copy of this test should be forwarded to Caryl Bis, MD.

## 2017-12-06 DIAGNOSIS — G4733 Obstructive sleep apnea (adult) (pediatric): Secondary | ICD-10-CM | POA: Diagnosis not present

## 2017-12-10 ENCOUNTER — Other Ambulatory Visit: Payer: Self-pay | Admitting: Cardiology

## 2017-12-14 DIAGNOSIS — Z6828 Body mass index (BMI) 28.0-28.9, adult: Secondary | ICD-10-CM | POA: Diagnosis not present

## 2017-12-14 DIAGNOSIS — J4521 Mild intermittent asthma with (acute) exacerbation: Secondary | ICD-10-CM | POA: Diagnosis not present

## 2017-12-14 DIAGNOSIS — J0100 Acute maxillary sinusitis, unspecified: Secondary | ICD-10-CM | POA: Diagnosis not present

## 2017-12-27 DIAGNOSIS — D519 Vitamin B12 deficiency anemia, unspecified: Secondary | ICD-10-CM | POA: Diagnosis not present

## 2017-12-27 DIAGNOSIS — Z13220 Encounter for screening for lipoid disorders: Secondary | ICD-10-CM | POA: Diagnosis not present

## 2017-12-27 DIAGNOSIS — E782 Mixed hyperlipidemia: Secondary | ICD-10-CM | POA: Diagnosis not present

## 2017-12-27 DIAGNOSIS — R69 Illness, unspecified: Secondary | ICD-10-CM | POA: Diagnosis not present

## 2017-12-27 DIAGNOSIS — I10 Essential (primary) hypertension: Secondary | ICD-10-CM | POA: Diagnosis not present

## 2017-12-27 DIAGNOSIS — E1165 Type 2 diabetes mellitus with hyperglycemia: Secondary | ICD-10-CM | POA: Diagnosis not present

## 2017-12-27 DIAGNOSIS — G4733 Obstructive sleep apnea (adult) (pediatric): Secondary | ICD-10-CM | POA: Diagnosis not present

## 2017-12-27 DIAGNOSIS — E1151 Type 2 diabetes mellitus with diabetic peripheral angiopathy without gangrene: Secondary | ICD-10-CM | POA: Diagnosis not present

## 2017-12-27 DIAGNOSIS — K219 Gastro-esophageal reflux disease without esophagitis: Secondary | ICD-10-CM | POA: Diagnosis not present

## 2017-12-28 ENCOUNTER — Encounter (HOSPITAL_COMMUNITY): Payer: Self-pay | Admitting: *Deleted

## 2017-12-28 ENCOUNTER — Emergency Department (HOSPITAL_COMMUNITY): Payer: Medicare HMO

## 2017-12-28 ENCOUNTER — Telehealth: Payer: Self-pay | Admitting: *Deleted

## 2017-12-28 ENCOUNTER — Other Ambulatory Visit: Payer: Self-pay

## 2017-12-28 ENCOUNTER — Inpatient Hospital Stay (HOSPITAL_COMMUNITY): Payer: Medicare HMO

## 2017-12-28 ENCOUNTER — Inpatient Hospital Stay (HOSPITAL_COMMUNITY)
Admission: EM | Admit: 2017-12-28 | Discharge: 2017-12-30 | DRG: 287 | Disposition: A | Discharge status: Home / Self Care | Payer: Medicare HMO | Attending: Internal Medicine | Admitting: Internal Medicine

## 2017-12-28 DIAGNOSIS — E1165 Type 2 diabetes mellitus with hyperglycemia: Secondary | ICD-10-CM | POA: Diagnosis not present

## 2017-12-28 DIAGNOSIS — I200 Unstable angina: Secondary | ICD-10-CM | POA: Diagnosis not present

## 2017-12-28 DIAGNOSIS — R0789 Other chest pain: Secondary | ICD-10-CM

## 2017-12-28 DIAGNOSIS — I25110 Atherosclerotic heart disease of native coronary artery with unstable angina pectoris: Secondary | ICD-10-CM

## 2017-12-28 DIAGNOSIS — Z882 Allergy status to sulfonamides status: Secondary | ICD-10-CM

## 2017-12-28 DIAGNOSIS — Z7984 Long term (current) use of oral hypoglycemic drugs: Secondary | ICD-10-CM

## 2017-12-28 DIAGNOSIS — F324 Major depressive disorder, single episode, in partial remission: Secondary | ICD-10-CM | POA: Diagnosis present

## 2017-12-28 DIAGNOSIS — Z8249 Family history of ischemic heart disease and other diseases of the circulatory system: Secondary | ICD-10-CM

## 2017-12-28 DIAGNOSIS — R0602 Shortness of breath: Secondary | ICD-10-CM | POA: Diagnosis not present

## 2017-12-28 DIAGNOSIS — I10 Essential (primary) hypertension: Secondary | ICD-10-CM | POA: Diagnosis not present

## 2017-12-28 DIAGNOSIS — Z823 Family history of stroke: Secondary | ICD-10-CM | POA: Diagnosis not present

## 2017-12-28 DIAGNOSIS — Z8042 Family history of malignant neoplasm of prostate: Secondary | ICD-10-CM

## 2017-12-28 DIAGNOSIS — K219 Gastro-esophageal reflux disease without esophagitis: Secondary | ICD-10-CM | POA: Diagnosis present

## 2017-12-28 DIAGNOSIS — Z955 Presence of coronary angioplasty implant and graft: Secondary | ICD-10-CM

## 2017-12-28 DIAGNOSIS — G4733 Obstructive sleep apnea (adult) (pediatric): Secondary | ICD-10-CM

## 2017-12-28 DIAGNOSIS — E875 Hyperkalemia: Secondary | ICD-10-CM | POA: Diagnosis present

## 2017-12-28 DIAGNOSIS — F419 Anxiety disorder, unspecified: Secondary | ICD-10-CM | POA: Diagnosis present

## 2017-12-28 DIAGNOSIS — E785 Hyperlipidemia, unspecified: Secondary | ICD-10-CM | POA: Diagnosis not present

## 2017-12-28 DIAGNOSIS — E782 Mixed hyperlipidemia: Secondary | ICD-10-CM | POA: Diagnosis present

## 2017-12-28 DIAGNOSIS — Z9989 Dependence on other enabling machines and devices: Secondary | ICD-10-CM | POA: Diagnosis not present

## 2017-12-28 DIAGNOSIS — I5030 Unspecified diastolic (congestive) heart failure: Secondary | ICD-10-CM | POA: Diagnosis not present

## 2017-12-28 DIAGNOSIS — Z789 Other specified health status: Secondary | ICD-10-CM | POA: Diagnosis not present

## 2017-12-28 DIAGNOSIS — Z800 Family history of malignant neoplasm of digestive organs: Secondary | ICD-10-CM

## 2017-12-28 DIAGNOSIS — K224 Dyskinesia of esophagus: Secondary | ICD-10-CM | POA: Diagnosis not present

## 2017-12-28 DIAGNOSIS — J45909 Unspecified asthma, uncomplicated: Secondary | ICD-10-CM | POA: Diagnosis present

## 2017-12-28 LAB — BASIC METABOLIC PANEL
ANION GAP: 10 (ref 5–15)
BUN: 11 mg/dL (ref 6–20)
CO2: 25 mmol/L (ref 22–32)
CREATININE: 0.71 mg/dL (ref 0.44–1.00)
Calcium: 9.2 mg/dL (ref 8.9–10.3)
Chloride: 99 mmol/L — ABNORMAL LOW (ref 101–111)
GFR calc non Af Amer: >60 mL/min (ref >60)
Glucose, Bld: 293 mg/dL — ABNORMAL HIGH (ref 65–99)
POTASSIUM: 3.7 mmol/L (ref 3.5–5.1)
Sodium: 134 mmol/L — ABNORMAL LOW (ref 135–145)

## 2017-12-28 LAB — ECHOCARDIOGRAM COMPLETE
Height: 62 in
WEIGHTICAEL: 2480 [oz_av]

## 2017-12-28 LAB — CBG MONITORING, ED: GLUCOSE-CAPILLARY: 227 mg/dL — AB (ref 65–99)

## 2017-12-28 LAB — PROTIME-INR
INR: 1.07
Prothrombin Time: 13.8 seconds (ref 11.4–15.2)

## 2017-12-28 LAB — CBC
HCT: 41.2 % (ref 36.0–46.0)
HEMOGLOBIN: 13.9 g/dL (ref 12.0–15.0)
MCH: 32.5 pg (ref 26.0–34.0)
MCHC: 33.7 g/dL (ref 30.0–36.0)
MCV: 96.3 fL (ref 78.0–100.0)
PLATELETS: 309 10*3/uL (ref 150–400)
RBC: 4.28 MIL/uL (ref 3.87–5.11)
RDW: 12.2 % (ref 11.5–15.5)
WBC: 10.6 10*3/uL — AB (ref 4.0–10.5)

## 2017-12-28 LAB — APTT: aPTT: 28 seconds (ref 24–36)

## 2017-12-28 LAB — I-STAT TROPONIN, ED
TROPONIN I, POC: 0 ng/mL (ref 0.00–0.08)
Troponin i, poc: 0 ng/mL (ref 0.00–0.08)

## 2017-12-28 LAB — HEMOGLOBIN A1C
Hgb A1c MFr Bld: 9.4 % — ABNORMAL HIGH (ref 4.8–5.6)
MEAN PLASMA GLUCOSE: 223.08 mg/dL

## 2017-12-28 LAB — HEPARIN LEVEL (UNFRACTIONATED): HEPARIN UNFRACTIONATED: 0.4 [IU]/mL (ref 0.30–0.70)

## 2017-12-28 LAB — MRSA PCR SCREENING: MRSA by PCR: NEGATIVE

## 2017-12-28 LAB — TROPONIN I

## 2017-12-28 LAB — GLUCOSE, CAPILLARY: GLUCOSE-CAPILLARY: 174 mg/dL — AB (ref 65–99)

## 2017-12-28 MED ORDER — ACETAMINOPHEN 325 MG PO TABS
650.0000 mg | ORAL_TABLET | ORAL | Status: DC | PRN
  Administered 2017-12-29: 650 mg via ORAL
  Filled 2017-12-28: qty 2

## 2017-12-28 MED ORDER — ONDANSETRON HCL 4 MG/2ML IJ SOLN: 4.0000 mg | Freq: Four times a day (QID) | INTRAMUSCULAR | Status: DC | PRN

## 2017-12-28 MED ORDER — ASPIRIN 81 MG PO CHEW
81.0000 mg | CHEWABLE_TABLET | ORAL | Status: AC
  Administered 2017-12-29: 81 mg via ORAL
  Filled 2017-12-28: qty 1

## 2017-12-28 MED ORDER — WHITE PETROLATUM EX OINT
TOPICAL_OINTMENT | CUTANEOUS | Status: AC
  Administered 2017-12-28: 0.2
  Filled 2017-12-28: qty 28.35

## 2017-12-28 MED ORDER — HYDROCODONE-ACETAMINOPHEN 5-325 MG PO TABS
1.0000 | ORAL_TABLET | Freq: Four times a day (QID) | ORAL | Status: DC | PRN
  Administered 2017-12-29 (×2): 1 via ORAL
  Filled 2017-12-28 (×2): qty 1

## 2017-12-28 MED ORDER — ALPRAZOLAM 0.5 MG PO TABS
1.0000 mg | ORAL_TABLET | Freq: Every evening | ORAL | Status: DC | PRN
  Administered 2017-12-28 – 2017-12-29 (×2): 1 mg via ORAL
  Filled 2017-12-28 (×2): qty 2

## 2017-12-28 MED ORDER — EZETIMIBE 10 MG PO TABS
10.0000 mg | ORAL_TABLET | Freq: Every day | ORAL | Status: DC
  Administered 2017-12-29 – 2017-12-30 (×2): 10 mg via ORAL
  Filled 2017-12-28 (×2): qty 1

## 2017-12-28 MED ORDER — DULOXETINE HCL 60 MG PO CPEP
60.0000 mg | ORAL_CAPSULE | Freq: Every day | ORAL | Status: DC
  Administered 2017-12-28 – 2017-12-29 (×2): 60 mg via ORAL
  Filled 2017-12-28 (×2): qty 1

## 2017-12-28 MED ORDER — FAMOTIDINE IN NACL 20-0.9 MG/50ML-% IV SOLN
20.0000 mg | Freq: Two times a day (BID) | INTRAVENOUS | Status: DC
  Administered 2017-12-28 – 2017-12-30 (×5): 20 mg via INTRAVENOUS
  Filled 2017-12-28 (×5): qty 50

## 2017-12-28 MED ORDER — SODIUM CHLORIDE 0.9 % WEIGHT BASED INFUSION: 1.0000 mL/kg/h | INTRAVENOUS | Status: DC

## 2017-12-28 MED ORDER — SODIUM CHLORIDE 0.9 % WEIGHT BASED INFUSION
3.0000 mL/kg/h | INTRAVENOUS | Status: DC
  Administered 2017-12-29: 3 mL/kg/h via INTRAVENOUS

## 2017-12-28 MED ORDER — NITROGLYCERIN IN D5W 200-5 MCG/ML-% IV SOLN: 5.0000 ug/min | Freq: Once | INTRAVENOUS | Status: DC

## 2017-12-28 MED ORDER — AMLODIPINE BESYLATE 5 MG PO TABS
7.5000 mg | ORAL_TABLET | Freq: Every day | ORAL | Status: DC
  Administered 2017-12-29 – 2017-12-30 (×2): 7.5 mg via ORAL
  Filled 2017-12-28 (×2): qty 1

## 2017-12-28 MED ORDER — ALBUTEROL SULFATE (2.5 MG/3ML) 0.083% IN NEBU: 3.0000 mL | INHALATION_SOLUTION | RESPIRATORY_TRACT | Status: DC | PRN

## 2017-12-28 MED ORDER — ASPIRIN EC 81 MG PO TBEC
81.0000 mg | DELAYED_RELEASE_TABLET | Freq: Every day | ORAL | Status: DC
  Administered 2017-12-30: 81 mg via ORAL
  Filled 2017-12-28: qty 1

## 2017-12-28 MED ORDER — HEPARIN BOLUS VIA INFUSION
4000.0000 [IU] | Freq: Once | INTRAVENOUS | Status: AC
  Administered 2017-12-28: 4000 [IU] via INTRAVENOUS

## 2017-12-28 MED ORDER — ATORVASTATIN CALCIUM 80 MG PO TABS
80.0000 mg | ORAL_TABLET | Freq: Every day | ORAL | Status: DC
  Administered 2017-12-29: 80 mg via ORAL
  Filled 2017-12-28: qty 1

## 2017-12-28 MED ORDER — NITROGLYCERIN IN D5W 200-5 MCG/ML-% IV SOLN
5.0000 ug/min | Freq: Once | INTRAVENOUS | Status: AC
  Administered 2017-12-28: 5 ug/min via INTRAVENOUS
  Filled 2017-12-28: qty 250

## 2017-12-28 MED ORDER — ONDANSETRON HCL 4 MG/2ML IJ SOLN
4.0000 mg | Freq: Once | INTRAMUSCULAR | Status: AC
  Administered 2017-12-28: 4 mg via INTRAVENOUS
  Filled 2017-12-28: qty 2

## 2017-12-28 MED ORDER — HEPARIN (PORCINE) IN NACL 100-0.45 UNIT/ML-% IJ SOLN
1050.0000 [IU]/h | INTRAMUSCULAR | Status: DC
  Administered 2017-12-28: 900 [IU]/h via INTRAVENOUS
  Filled 2017-12-28: qty 250

## 2017-12-28 MED ORDER — IOPAMIDOL (ISOVUE-370) INJECTION 76%
100.0000 mL | Freq: Once | INTRAVENOUS | Status: AC | PRN
  Administered 2017-12-28: 100 mL via INTRAVENOUS

## 2017-12-28 MED ORDER — METOPROLOL TARTRATE 50 MG PO TABS
50.0000 mg | ORAL_TABLET | Freq: Two times a day (BID) | ORAL | Status: DC
  Administered 2017-12-28 – 2017-12-30 (×4): 50 mg via ORAL
  Filled 2017-12-28 (×4): qty 1

## 2017-12-28 MED ORDER — ASPIRIN 81 MG PO CHEW
324.0000 mg | CHEWABLE_TABLET | Freq: Once | ORAL | Status: AC
  Administered 2017-12-28: 324 mg via ORAL
  Filled 2017-12-28: qty 4

## 2017-12-28 MED ORDER — MORPHINE SULFATE (PF) 4 MG/ML IV SOLN
4.0000 mg | INTRAVENOUS | Status: AC | PRN
  Administered 2017-12-28 (×3): 4 mg via INTRAVENOUS
  Filled 2017-12-28 (×3): qty 1

## 2017-12-28 MED ORDER — INSULIN ASPART 100 UNIT/ML ~~LOC~~ SOLN
0.0000 [IU] | Freq: Three times a day (TID) | SUBCUTANEOUS | Status: DC
  Administered 2017-12-28 – 2017-12-29 (×2): 5 [IU] via SUBCUTANEOUS
  Administered 2017-12-29 – 2017-12-30 (×2): 3 [IU] via SUBCUTANEOUS
  Administered 2017-12-30: 5 [IU] via SUBCUTANEOUS
  Filled 2017-12-28: qty 1

## 2017-12-28 MED ORDER — MONTELUKAST SODIUM 10 MG PO TABS
10.0000 mg | ORAL_TABLET | Freq: Every day | ORAL | Status: DC
  Administered 2017-12-28 – 2017-12-29 (×2): 10 mg via ORAL
  Filled 2017-12-28 (×2): qty 1

## 2017-12-28 MED ORDER — SODIUM CHLORIDE 0.9% FLUSH: 3.0000 mL | INTRAVENOUS | Status: DC | PRN

## 2017-12-28 MED ORDER — NITROGLYCERIN 0.4 MG SL SUBL: 0.4000 mg | SUBLINGUAL_TABLET | SUBLINGUAL | Status: DC | PRN

## 2017-12-28 MED ORDER — SODIUM CHLORIDE 0.9% FLUSH
3.0000 mL | Freq: Two times a day (BID) | INTRAVENOUS | Status: DC
  Administered 2017-12-28: 3 mL via INTRAVENOUS

## 2017-12-28 MED ORDER — SODIUM CHLORIDE 0.9 % IV SOLN: 250.0000 mL | INTRAVENOUS | Status: DC | PRN

## 2017-12-28 NOTE — Progress Notes (Signed)
Pt transferred from [facility] ED, admitted to [id number]. She is alert and oriented x4, states she is still having mild chest pain with exertion (MD aware). No skin breakdown noted. Placed on telemetry, [place] notified. Oriented to room, instructed to call for assistance before getting out of bed. Clarified admission orders with admitting MD, will continue monitoring

## 2017-12-28 NOTE — ED Notes (Signed)
Pt still complaining of slight chest pain. Have increased Nitro

## 2017-12-28 NOTE — Progress Notes (Addendum)
[facility] for Heparin Indication: chest pain/ACS  Allergies  Allergen Reactions  . Erythromycin Base Diarrhea  . Azithromycin Other (See Comments)    Upset stomach   . Other     Grass, dust, green beans, corn, cabbage  . Pollen Extract   . Remeron [Mirtazapine] Other (See Comments)    Makes her too sleepy  . Tree Extract   . Zolpidem Tartrate Other (See Comments)    amnesia  . Sulfonamide Derivatives Rash    Mouth sores    Patient Measurements: Height: 5\' 2"  (157.5 cm) Weight: 158 lb 11.7 oz (72 kg) IBW/kg (Calculated) : 50.1 HEPARIN DW (KG): 65.4   Vital Signs: Temp: 97.6 F (36.4 C) (06/11 2042) Temp Source: Oral (06/11 2042) BP: 165/60 (06/11 2042) Pulse Rate: 59 (06/11 2042)  Labs: Recent Labs    12/28/17 1110 12/28/17 1541 12/28/17 2054 12/28/17 2055  HGB 13.9  --   --   --   HCT 41.2  --   --   --   PLT 309  --   --   --   APTT  --  28  --   --   LABPROT  --  13.8  --   --   INR  --  1.07  --   --   HEPARINUNFRC  --   --   --  0.40  CREATININE 0.71  --   --   --   TROPONINI  --   --  <0.03  --     Estimated Creatinine Clearance: 61.7 mL/min (by C-G formula based on SCr of 0.71 mg/dL).   Medical History: Past Medical History:  Diagnosis Date  . Anxiety   . Arthritis   . Asthma   . Barrett esophagus   . Chronic bronchitis ([place])   . Chronic diarrhea   . Chronic lower back pain   . Coronary atherosclerosis of native coronary artery    DES to LAD May 2014  . Diverticulosis   . Essential hypertension, benign   . Fatty liver   . Fibromyalgia   . GERD (gastroesophageal reflux disease)   . H/O hiatal hernia   . IBS (irritable bowel syndrome)   . Major depression in partial remission ([place])   . Migraines   . Mixed hyperlipidemia   . OSA on CPAP   . Pancreatitis 2008  . Pneumonia 1990's  . Type 2 diabetes mellitus ([facility])     Medications:  Medications Prior to Admission  Medication Sig Dispense Refill  Last Dose  . acetaminophen (TYLENOL) 500 MG tablet Take 1,000 mg by mouth every 8 (eight) hours as needed for fever.   12/26/2017  . albuterol (PROVENTIL HFA;VENTOLIN HFA) 108 (90 Base) MCG/ACT inhaler Inhale 2 puffs into the lungs every 4 (four) hours as needed.  3 12/27/2017 at Unknown time  . ALPRAZolam (XANAX) 1 MG tablet TAKE 1 TABLET BY MOUTH AT BEDTIME AS NEEDED FOR ANXIETY  1 12/27/2017 at 2030  . amLODipine (NORVASC) 5 MG tablet TAKE 1 AND 1/2 TABLETS BY MOUTH EVERY DAY 135 tablet 1 12/28/2017 at 0930  . aspirin EC 81 MG tablet Take 81 mg by mouth daily.   12/28/2017 at 0930  . doxycycline (VIBRA-TABS) 100 MG tablet Take 1 tablet by mouth 2 (two) times daily.   12/28/2017 at 0800  . DULoxetine (CYMBALTA) 60 MG capsule Take 60 mg by mouth daily.    12/27/2017 at 2030  . ezetimibe (ZETIA) 10 MG tablet  TAKE 1 TABLET BY MOUTH EVERY DAY 90 tablet 3 12/28/2017 at 0930  . Guaifenesin (MUCINEX MAXIMUM STRENGTH) 1200 MG TB12 Take 1 tablet by mouth 2 (two) times daily as needed.   12/28/2017 at 0800  . HYDROcodone-acetaminophen (NORCO/VICODIN) 5-325 MG tablet Take 1 tablet twice a day as needed  0 12/27/2017 at 2030  . loratadine (CLARITIN) 10 MG tablet Take 10 mg by mouth daily as needed for allergies.   Past Week at Unknown time  . losartan-hydrochlorothiazide (HYZAAR) 100-25 MG tablet Take 1 tablet by mouth daily.    12/28/2017 at 0800  . metoprolol tartrate (LOPRESSOR) 50 MG tablet TAKE 1 TABLET BY MOUTH TWICE DAILY 180 tablet 0 12/28/2017 at 0930  . montelukast (SINGULAIR) 10 MG tablet Take 10 mg by mouth at bedtime.    12/27/2017 at 2030  . nitroGLYCERIN (NITROSTAT) 0.4 MG SL tablet Place 0.4 mg under the tongue every 5 (five) minutes as needed for chest pain.   12/24/2017  . promethazine (PHENERGAN) 25 MG tablet Take 25 mg by mouth every 6 (six) hours as needed for nausea or vomiting.   12/27/2017 at 2030  . traZODone (DESYREL) 50 MG tablet Take 200 mg by mouth at bedtime. 4 tabs hs   12/27/2017 at 2030  .  TRESIBA FLEXTOUCH 200 UNIT/ML SOPN Inject 130 Units into the skin at bedtime. As directed   12/27/2017 at 2030  . predniSONE (DELTASONE) 50 MG tablet Take 50 mg by mouth daily with breakfast.   Completed Course at Unknown time    Assessment: 67 yo lady started on heparin at [facility] for CP.  Her initial heparin level is therapeutic.  Goal of Therapy:  Heparin level 0.3-0.7 units/ml Monitor platelets by anticoagulation protocol: Yes   Plan:  Continue heparin at 900 units/hr F/u am labs  [name] 12/28/2017,11:26 PM Addum:  Heparin level 0.23 units/ml.  Will increase to 1050 units/hr and check HL in 6 hours.

## 2017-12-28 NOTE — ED Notes (Signed)
Pt to CT

## 2017-12-28 NOTE — Telephone Encounter (Signed)
On June 1 pt states she was on the way to the beach and started to have chest pain. She reports this is the worse chest pain she has had. Pt reports that at that time she did feel nauseous. She did stop and drank some Sprite and took a 2 Nitro and 81 ASA. Pt states that she has been having chest pain since this event. Today pt reports chest pain, back pain and DOE. She denies nausea at this time. Pt coming to [facility] with son to be evaluated for current chest pain.

## 2017-12-28 NOTE — ED Notes (Signed)
Spoke with Marden Noble at [facility] will send a transportation when a bed is available.

## 2017-12-28 NOTE — ED Notes (Signed)
Echo in room

## 2017-12-28 NOTE — Progress Notes (Signed)
[place] for Heparin Indication: chest pain/ACS  Allergies  Allergen Reactions  . Erythromycin Base Diarrhea  . Azithromycin Other (See Comments)    Upset stomach   . Other     Grass, dust, green beans, corn, cabbage  . Pollen Extract   . Remeron [Mirtazapine] Other (See Comments)    Makes her too sleepy  . Tree Extract   . Zolpidem Tartrate Other (See Comments)    amnesia  . Sulfonamide Derivatives Rash    Mouth sores    Patient Measurements: Height: 5\' 2"  (157.5 cm) Weight: 155 lb (70.3 kg) IBW/kg (Calculated) : 50.1 HEPARIN DW (KG): 64.9   Vital Signs: BP: 118/65 (06/11 1500) Pulse Rate: 53 (06/11 1500)  Labs: Recent Labs    12/28/17 1110  HGB 13.9  HCT 41.2  PLT 309  CREATININE 0.71    Estimated Creatinine Clearance: 61 mL/min (by C-G formula based on SCr of 0.71 mg/dL).   Medical History: Past Medical History:  Diagnosis Date  . Anxiety   . Arthritis   . Asthma   . Barrett esophagus   . Chronic bronchitis ([place])   . Chronic diarrhea   . Chronic lower back pain   . Coronary atherosclerosis of native coronary artery    DES to LAD May 2014  . Diverticulosis   . Essential hypertension, benign   . Fatty liver   . Fibromyalgia   . GERD (gastroesophageal reflux disease)   . H/O hiatal hernia   . IBS (irritable bowel syndrome)   . Major depression in partial remission ([place])   . Migraines   . Mixed hyperlipidemia   . OSA on CPAP   . Pancreatitis 2008  . Pneumonia 1990's  . Type 2 diabetes mellitus ([facility])     Medications:   (Not in a hospital admission)  Assessment: Okay for Protocol   Goal of Therapy:  Heparin level 0.3-0.7 units/ml Monitor platelets by anticoagulation protocol: Yes   Plan:  Give 4000 units bolus x 1 Start heparin infusion at 900 units/hr Check anti-Xa level in 6-8 hours and daily while on heparin Continue to monitor H&H and platelets  Pricilla Larsson 12/28/2017,3:22 PM

## 2017-12-28 NOTE — ED Notes (Signed)
Pt not tolerating Nitro at 15. Have titrated back to 20

## 2017-12-28 NOTE — ED Notes (Signed)
Have given pt a sprite

## 2017-12-28 NOTE — ED Notes (Signed)
Spoke to [place] at [facility] to let them know pt's bed is assigned.

## 2017-12-28 NOTE — ED Notes (Signed)
Spoke with [name] at pt placement about bed at [facility] no telemetry beds are available at this time. RN made aware.

## 2017-12-28 NOTE — ED Notes (Signed)
Pt ambulating to rest room.

## 2017-12-28 NOTE — Progress Notes (Signed)
*  PRELIMINARY RESULTS* Echocardiogram 2D Echocardiogram has been performed.  Leavy Cella 12/28/2017, 4:50 PM

## 2017-12-28 NOTE — H&P (Signed)
History & Physical    Patient ID: [name] MRN: [id number], DOB/AGE: 1951-06-23   Admit date: 12/28/2017   Primary Physician: Caryl Bis, MD Primary Cardiologist: No primary care provider on file.  Patient Profile  Unstable angina  Past Medical History    Past Medical History:  Diagnosis Date  . Anxiety   . Arthritis   . Asthma   . Barrett esophagus   . Chronic bronchitis ([place])   . Chronic diarrhea   . Chronic lower back pain   . Coronary atherosclerosis of native coronary artery    DES to LAD May 2014  . Diverticulosis   . Essential hypertension, benign   . Fatty liver   . Fibromyalgia   . GERD (gastroesophageal reflux disease)   . H/O hiatal hernia   . IBS (irritable bowel syndrome)   . Major depression in partial remission ([place])   . Migraines   . Mixed hyperlipidemia   . OSA on CPAP   . Pancreatitis 2008  . Pneumonia 1990's  . Type 2 diabetes mellitus ([place])     Past Surgical History:  Procedure Laterality Date  . ABDOMINAL HYSTERECTOMY  ?1987  . APPENDECTOMY  ?1987  . BUNIONECTOMY Bilateral 1972  . CARPAL TUNNEL RELEASE Bilateral ?1990  . CATARACT EXTRACTION W/ INTRAOCULAR LENS  IMPLANT, BILATERAL Bilateral ?2005  . CESAREAN SECTION  1985  . CHOLECYSTECTOMY  1990'S  . COLONOSCOPY  10/13   Dr. Britta Mccreedy - tubular adenomas and diverticulosis  . DILATION AND CURETTAGE OF UTERUS  1970's   "probably 3" (12/09/2012)  . ERCP  2008  . ESOPHAGEAL DILATION  ` 2012  . ESOPHAGEAL MANOMETRY N/A 10/21/2015   Procedure: ESOPHAGEAL MANOMETRY (EM);  Surgeon: Doran Stabler, MD;  Location: [facility];  Service: Gastroenterology;  Laterality: N/A;  . HUMERUS FRACTURE SURGERY Right 07/19/1969   "horst threw me" (12/09/2012)  . NASAL SEPTUM SURGERY  1970's  . PERCUTANEOUS CORONARY STENT INTERVENTION (PCI-S) N/A 12/09/2012   Procedure: PERCUTANEOUS CORONARY STENT INTERVENTION (PCI-S);  Surgeon: Burnell Blanks, MD;  Location: [facility] C[facility] LAB;  Service:  Cardiovascular;  Laterality: N/A;  . TOE FUSION Right 10/11   First MTP joint  . TOE SURGERY Right 1990's   "toe next to big toe:  dr said I had tumor; cut bone & stuff; another OR dr stretched tendons, etc" (12/09/2012)  . TUBAL LIGATION  1985     Allergies  Allergies  Allergen Reactions  . Erythromycin Base Diarrhea  . Azithromycin Other (See Comments)    Upset stomach   . Other     Grass, dust, green beans, corn, cabbage  . Pollen Extract   . Remeron [Mirtazapine] Other (See Comments)    Makes her too sleepy  . Tree Extract   . Zolpidem Tartrate Other (See Comments)    amnesia  . Sulfonamide Derivatives Rash    Mouth sores    History of Present Illness    67 year old woman with a past medical history of CAD status pos PCI to the LAD in 2014.  She has also known history of hypertension diabetes, GERD, major depression, chronic GI issues.  She presents with a one-week history of intermittent hour-long chest pain at rest and with mild activity.  Pain started 1 week ago when she was driving to the beach pain lasted for several hours.  She took nitroglycerin with minor relief of symptoms only.  Pain was crushing 10 out of 10 is worse than the episode in  2014 that led to the PCI.  Pain has been coming going.  Makes it worse.  Try to inhaler for new onset of shortness of breath but did not help.  She went to have any pain ER and a normal ECG without ischemic changes, and blood pressure readings in the 180s.  She does have normal blood pressure in the mornings after taking her morning medications but blood pressure does rise to the 180s in the evening before she takes then again the evening medications.  Blood pressure is now under better control while transferring to [facility].  She still reports some chest pain she was started nitroglycerin drip which seems to control her pain.  Home Medications    Prior to Admission medications   Medication Sig Start Date End Date Taking?  Authorizing Provider  acetaminophen (TYLENOL) 500 MG tablet Take 1,000 mg by mouth every 8 (eight) hours as needed for fever.   Yes [provider]  albuterol (PROVENTIL HFA;VENTOLIN HFA) 108 (90 Base) MCG/ACT inhaler Inhale 2 puffs into the lungs every 4 (four) hours as needed. 12/15/17  Yes [provider]  ALPRAZolam (XANAX) 1 MG tablet TAKE 1 TABLET BY MOUTH AT BEDTIME AS NEEDED FOR ANXIETY 10/22/15  Yes [provider]  amLODipine (NORVASC) 5 MG tablet TAKE 1 AND 1/2 TABLETS BY MOUTH EVERY DAY 12/10/17  Yes [name], MD  aspirin EC 81 MG tablet Take 81 mg by mouth daily.   Yes [provider]  doxycycline (VIBRA-TABS) 100 MG tablet Take 1 tablet by mouth 2 (two) times daily. 12/27/17  Yes [provider]  DULoxetine (CYMBALTA) 60 MG capsule Take 60 mg by mouth daily.  08/15/15  Yes [provider]  ezetimibe (ZETIA) 10 MG tablet TAKE 1 TABLET BY MOUTH EVERY DAY 04/29/17  Yes [name], MD  Guaifenesin [facility] MAXIMUM STRENGTH) 1200 MG TB12 Take 1 tablet by mouth 2 (two) times daily as needed.   Yes [provider]  HYDROcodone-acetaminophen (NORCO/VICODIN) 5-325 MG tablet Take 1 tablet twice a day as needed 10/22/15  Yes [provider]  loratadine (CLARITIN) 10 MG tablet Take 10 mg by mouth daily as needed for allergies.   Yes [provider]  losartan-hydrochlorothiazide (HYZAAR) 100-25 MG tablet Take 1 tablet by mouth daily.  08/15/15  Yes [provider]  metoprolol tartrate (LOPRESSOR) 50 MG tablet TAKE 1 TABLET BY MOUTH TWICE DAILY 02/01/17  Yes [name], MD  montelukast (SINGULAIR) 10 MG tablet Take 10 mg by mouth at bedtime.  08/15/15  Yes [provider]  nitroGLYCERIN (NITROSTAT) 0.4 MG SL tablet Place 0.4 mg under the tongue every 5 (five) minutes as needed for chest pain.   Yes [provider]  promethazine (PHENERGAN) 25 MG tablet Take 25 mg by mouth every 6  (six) hours as needed for nausea or vomiting.   Yes [provider]  traZODone (DESYREL) 50 MG tablet Take 200 mg by mouth at bedtime. 4 tabs hs 08/15/15  Yes [provider]  TRESIBA FLEXTOUCH 200 UNIT/ML SOPN Inject 130 Units into the skin at bedtime. As directed 10/28/15  Yes [provider]  predniSONE (DELTASONE) 50 MG tablet Take 50 mg by mouth daily with breakfast.    [provider]    Family History    Family History  Problem Relation Age of Onset  . Stroke Mother   . Cirrhosis Father   . Hypertension Father   . Esophageal cancer Sister   .  Colon polyps Sister   . Prostate cancer Brother   . Colon cancer Neg Hx    indicated that her mother is deceased. She indicated that her father is deceased. She indicated that only one of her two sisters is alive. She indicated that the status of her brother is unknown. She indicated that the status of her neg hx is unknown.   Social History    Social History   Socioeconomic History  . Marital status: Married    Spouse name: Konrad Dolores  . Number of children: 2  . Years of education: Not on file  . Highest education level: Not on file  Occupational History  . Occupation: Retired  [employer]  Social Needs  . Financial resource strain: Not on file  . Food insecurity:    Worry: Not on file    Inability: Not on file  . Transportation needs:    Medical: Not on file    Non-medical: Not on file  Tobacco Use  . Smoking status: Former Smoker    Packs/day: 0.80    Years: 15.00    Pack years: 12.00    Types: Cigarettes    Start date: 07/20/1976    Last attempt to quit: 07/20/1993    Years since quitting: 24.4  . Smokeless tobacco: Never Used  Substance and Sexual Activity  . Alcohol use: No    Alcohol/week: 0.0 oz  . Drug use: No  . Sexual activity: Yes  Lifestyle  . Physical activity:    Days per week: Not on file    Minutes per session: Not on file  . Stress: Not on file  Relationships  . Social  connections:    Talks on phone: Not on file    Gets together: Not on file    Attends religious service: Not on file    Active member of club or organization: Not on file    Attends meetings of clubs or organizations: Not on file    Relationship status: Not on file  . Intimate partner violence:    Fear of current or ex partner: Not on file    Emotionally abused: Not on file    Physically abused: Not on file    Forced sexual activity: Not on file  Other Topics Concern  . Not on file  Social History Narrative  . Not on file     Review of Systems    General:  No chills, fever, night sweats or weight changes.  Cardiovascular:  No chest pain, dyspnea on exertion, edema, orthopnea, palpitations, paroxysmal nocturnal dyspnea. Dermatological: No rash, lesions/masses Respiratory: No cough, dyspnea Urologic: No hematuria, dysuria Abdominal:   No nausea, vomiting, diarrhea, bright red blood per rectum, melena, or hematemesis Neurologic:  No visual changes, wkns, changes in mental status. All other systems reviewed and are otherwise negative except as noted above.  Physical Exam    Blood pressure (!) 165/60, pulse (!) 59, temperature 97.6 F (36.4 C), temperature source Oral, resp. rate 16, height 5\' 2"  (1.575 m), weight 72 kg (158 lb 11.7 oz), SpO2 95 %.  General: Pleasant, NAD Psych: Normal affect. Neuro: Alert and oriented X 3. Moves all extremities spontaneously. HEENT: Normal  Neck: Supple without bruits or JVD. Lungs:  Resp regular and unlabored, CTA. Heart: RRR no s3, s4, or murmurs. Abdomen: Soft, non-tender, non-distended, BS + x 4.  Extremities: No clubbing, cyanosis or edema. DP/PT/Radials 2+ and equal bilaterally.  Labs    Troponin [facility] of Care Test) Recent Labs  12/28/17 1443  TROPIPOC 0.00   No results for input(s): CKTOTAL, CKMB, TROPONINI in the last 72 hours. Lab Results  Component Value Date   WBC 10.6 (H) 12/28/2017   HGB 13.9 12/28/2017   HCT 41.2  12/28/2017   MCV 96.3 12/28/2017   PLT 309 12/28/2017    Recent Labs  Lab 12/28/17 1110  NA 134*  K 3.7  CL 99*  CO2 25  BUN 11  CREATININE 0.71  CALCIUM 9.2  GLUCOSE 293*   No results found for: CHOL, HDL, LDLCALC, TRIG No results found for: [facility]   Radiology Studies    Ct Angio Chest Aorta W And/or Wo Contrast  Result Date: 12/28/2017 CLINICAL DATA:  LEFT SIDE CHEST/FLANK PAIN, MID CHEST PAIN THAT RADIATES TO BACK WITH SHORTNESS OF BREATH SINCE JUNE 1ST, HX CARDIAC STENT 2014 EXAM: CT ANGIOGRAPHY CHEST WITH CONTRAST TECHNIQUE: Multidetector CT imaging of the chest was performed using the standard protocol during bolus administration of intravenous contrast. Multiplanar CT image reconstructions and MIPs were obtained to evaluate the vascular anatomy. CONTRAST:  145mL ISOVUE-370 IOPAMIDOL (ISOVUE-370) INJECTION 76% COMPARISON:  05/02/2014 FINDINGS: Cardiovascular: Heart size normal. No effusion. Central pulmonary arteries are nondilated. Satisfactory opacification of pulmonary arteries noted, and there is no evidence of pulmonary emboli. Scattered coronary calcifications. Adequate contrast opacification of the thoracic aorta with no evidence of dissection, aneurysm, or stenosis. There is classic 3-vessel brachiocephalic arch anatomy without proximal stenosis. Nonocclusive partially calcified plaque at the origin of the left subclavian artery. Calcified plaque in the aortic arch and descending thoracic segment. Mediastinum/Nodes: Slight enlargement of anterior mediastinal node now 11 mm short axis diameter, previously 10 mm on 05/02/2014. No other mediastinal or hilar adenopathy. Lungs/Pleura: No pneumothorax. No pleural effusion. Lungs are clear. Upper Abdomen: Prominent irregular geographic region of markedly decreased attenuation in the visualized liver. No capsular or contour distortion. There is no intraparenchymal distortion, with normal vessels traversing the region. Findings are most  consistent with focal fatty infiltration. Findings were reviewed with Dr. Candise Che who concurs. No acute findings. Musculoskeletal: No chest wall abnormality. No acute or significant osseous findings. Review of the MIP images confirms the above findings. IMPRESSION: 1. Negative for acute PE or thoracic aortic dissection. 2. Coronary and Aortic Atherosclerosis (ICD10-170.0) 3. Slight continued interval enlargement of solitary anterior mediastinal lymph node since 05/02/2014, nonspecific. 4. Probable focal fatty infiltration in the liver. Electronically Signed   By: Lucrezia Europe M.D.   On: 12/28/2017 12:54    ECG & Cardiac Imaging    ECG changes suggestive of ischemia.  Assessment & Plan    Unstable angina: Still symptomatic, known CAD with PCI to LAD in 2014.  TTE with preserved LVEF.  Plan: Admit to cardiology -Continue nitroglycerin drip -Start on statin, continue metoprolol, ezetimibe. -Start on aspirin and heparin drip. -Keep n.p.o. with plan for left heart catheterization in the morning. -Blood pressure not better controlled.  Signed, Ediberto Sens, md 12/28/2017, 9:52 PM

## 2017-12-28 NOTE — ED Triage Notes (Signed)
Pt c/o intermittent chest pain that radiates to her back, SOB and nausea since June 1st.

## 2017-12-28 NOTE — Consult Note (Addendum)
Cardiology Consultation:   Patient ID: [name]; [id number]; 10/25/1950   Admit date: 12/28/2017 Date of Consult: 12/28/2017  Primary Care Provider: Caryl Bis, MD Primary Cardiologist: Dr. Johnny Bridge   Patient Profile:   [name] is a 67 y.o. female with a hx of coronary artery disease who is being seen today for the evaluation of chest pain at the request of Dr. Tanna Furry.  History of Present Illness:   [name] is a 67 year old woman with a history of coronary artery disease with prior drug-eluting stent placement to the LAD in 2014.  She was last evaluated by Dr. Domenic Polite in the office on 04/23/2017 at which time she was doing well.  She underwent a low risk nuclear stress test on 06/05/2016 with no reversible defects to indicate ischemia, LVEF 71%.  She also has a history of type 2 diabetes mellitus, obstructive sleep apnea on CPAP, mild bilateral carotid artery disease, and hyperlipidemia with statin intolerance.  Coronary angiogram on 12/09/2012 showed no obstructive left main disease, 20% proximal left circumflex coronary artery stenosis, and mid RCA to 5% stenosis.  This was at the time she underwent struggling stent placement for a mid LAD 95% stenosis.  The proximal LAD had diffuse 20% stenosis.  She called our office on 11/01/2017 with complaints of fatigue and elevated blood pressure with headaches.  She started doing tai chi at that time.  A review of blood pressures at that time appear to be consistently elevated based on a phone note on 11/01/2017.  Carotid artery Dopplers were obtained which showed 1 to 39% mild bilateral disease.  It was also recommended that she follow-up with her PCP regarding blood pressure management.  She saw her PCP (Dr. Quillian Quince) during the last week of May and was diagnosed and treated for an "asthma attack and sinus infection" with steroids and antibiotics.  She called our office this morning stating that on June 1 she was on her way to  the beach and started to have chest pain.  She apparently drank some Sprite and took 2 nitroglycerin and a baby aspirin at that time. She said since that time she has been having chest pain and it has been intermittent over the past week. Today it radiated to her back.  She said on June 1, it was the worst pain she had ever experienced.  I personally reviewed the ECG performed today which demonstrates sinus rhythm, 63 bpm, with no acute ischemic changes.  I reviewed the CT angiogram of the chest which was negative for acute pulmonary embolism or thoracic aortic dissection.  There was probable focal fatty infiltration in the liver and a slight continued interval enlargement of a solitary anterior mediastinal lymph node which was deemed to be nonspecific.  Coronary and aortic atherosclerosis was also seen.  Point-of-care troponin was normal.  CBC notable for mildly elevated white count of 10.6.  Additional labs include sodium 134, potassium 3.7, chloride 99, BUN 11, creatinine 0.71.  She was started on IV nitroglycerin which is currently at 20 mcg/min. The nurse tried turning it down earlier but chest pain recurred. Chest pain is now present but milder in intensity.    Past Medical History:  Diagnosis Date  . Anxiety   . Arthritis   . Asthma   . Barrett esophagus   . Chronic bronchitis ([place])   . Chronic diarrhea   . Chronic lower back pain   . Coronary atherosclerosis of native coronary artery  DES to LAD May 2014  . Diverticulosis   . Essential hypertension, benign   . Fatty liver   . Fibromyalgia   . GERD (gastroesophageal reflux disease)   . H/O hiatal hernia   . IBS (irritable bowel syndrome)   . Major depression in partial remission ([place])   . Migraines   . Mixed hyperlipidemia   . OSA on CPAP   . Pancreatitis 2008  . Pneumonia 1990's  . Type 2 diabetes mellitus ([place])     Past Surgical History:  Procedure Laterality Date  . ABDOMINAL HYSTERECTOMY  ?1987  . APPENDECTOMY   ?1987  . BUNIONECTOMY Bilateral 1972  . CARPAL TUNNEL RELEASE Bilateral ?1990  . CATARACT EXTRACTION W/ INTRAOCULAR LENS  IMPLANT, BILATERAL Bilateral ?2005  . CESAREAN SECTION  1985  . CHOLECYSTECTOMY  1990'S  . COLONOSCOPY  10/13   Dr. Britta Mccreedy - tubular adenomas and diverticulosis  . DILATION AND CURETTAGE OF UTERUS  1970's   "probably 3" (12/09/2012)  . ERCP  2008  . ESOPHAGEAL DILATION  ` 2012  . ESOPHAGEAL MANOMETRY N/A 10/21/2015   Procedure: ESOPHAGEAL MANOMETRY (EM);  Surgeon: Doran Stabler, MD;  Location: [facility];  Service: Gastroenterology;  Laterality: N/A;  . HUMERUS FRACTURE SURGERY Right 07/19/1969   "horst threw me" (12/09/2012)  . NASAL SEPTUM SURGERY  1970's  . PERCUTANEOUS CORONARY STENT INTERVENTION (PCI-S) N/A 12/09/2012   Procedure: PERCUTANEOUS CORONARY STENT INTERVENTION (PCI-S);  Surgeon: Burnell Blanks, MD;  Location: [facility] CATH LAB;  Service: Cardiovascular;  Laterality: N/A;  . TOE FUSION Right 10/11   First MTP joint  . TOE SURGERY Right 1990's   "toe next to big toe:  dr said I had tumor; cut bone & stuff; another OR dr stretched tendons, etc" (12/09/2012)  . TUBAL LIGATION  1985       Inpatient Medications: Scheduled Meds:  Continuous Infusions: . famotidine (PEPCID) IV Stopped (12/28/17 1220)   PRN Meds: morphine injection  Allergies:    Allergies  Allergen Reactions  . Erythromycin Base Diarrhea  . Azithromycin Other (See Comments)    Upset stomach   . Other     Grass, dust, green beans, corn, cabbage  . Pollen Extract   . Remeron [Mirtazapine] Other (See Comments)    Makes her too sleepy  . Tree Extract   . Zolpidem Tartrate Other (See Comments)    amnesia  . Sulfonamide Derivatives Rash    Mouth sores    Social History:   Social History   Socioeconomic History  . Marital status: Married    Spouse name: Konrad Dolores  . Number of children: 2  . Years of education: Not on file  . Highest education level: Not on file    Occupational History  . Occupation: Retired  [employer]  Social Needs  . Financial resource strain: Not on file  . Food insecurity:    Worry: Not on file    Inability: Not on file  . Transportation needs:    Medical: Not on file    Non-medical: Not on file  Tobacco Use  . Smoking status: Former Smoker    Packs/day: 0.80    Years: 15.00    Pack years: 12.00    Types: Cigarettes    Start date: 07/20/1976    Last attempt to quit: 07/20/1993    Years since quitting: 24.4  . Smokeless tobacco: Never Used  Substance and Sexual Activity  . Alcohol use: No    Alcohol/week: 0.0 oz  .  Drug use: No  . Sexual activity: Yes  Lifestyle  . Physical activity:    Days per week: Not on file    Minutes per session: Not on file  . Stress: Not on file  Relationships  . Social connections:    Talks on phone: Not on file    Gets together: Not on file    Attends religious service: Not on file    Active member of club or organization: Not on file    Attends meetings of clubs or organizations: Not on file    Relationship status: Not on file  . Intimate partner violence:    Fear of current or ex partner: Not on file    Emotionally abused: Not on file    Physically abused: Not on file    Forced sexual activity: Not on file  Other Topics Concern  . Not on file  Social History Narrative  . Not on file    Family History:    Family History  Problem Relation Age of Onset  . Stroke Mother   . Cirrhosis Father   . Hypertension Father   . Esophageal cancer Sister   . Colon polyps Sister   . Prostate cancer Brother   . Colon cancer Neg Hx      ROS:  Please see the history of present illness.   All other ROS reviewed and negative.     Physical Exam/Data:   Vitals:   12/28/17 1300 12/28/17 1330 12/28/17 1400 12/28/17 1430  BP: 116/69 126/62 120/62 104/60  Pulse: (!) 57 (!) 56 (!) 57 (!) 54  Resp: '19 16 13 15  ' SpO2: 94% 93% 95% 93%  Weight:      Height:       No intake or output data in  the 24 hours ending 12/28/17 1446 Filed Weights   12/28/17 1104  Weight: 155 lb (70.3 kg)   Body mass index is 28.35 kg/m.  General:  Well nourished, well developed, in no acute distress HEENT: normal Lymph: no adenopathy Neck: no JVD Endocrine:  No thryomegaly Cardiac:  normal S1, S2; RRR; no murmur  Lungs:  clear to auscultation bilaterally, no wheezing, rhonchi or rales  Abd: soft, nontender, no hepatomegaly  Ext: no edema Musculoskeletal:  No deformities, BUE and BLE strength normal and equal Skin: warm and dry  Neuro:  CNs 2-12 intact, no focal abnormalities noted Psych:  Normal affect   EKG:  Reviewed above  Telemetry:  Telemetry was personally reviewed and demonstrates:  Sinus rhythm  Relevant CV Studies: Coronary angiography and stress test reviewed above  Laboratory Data:  Chemistry Recent Labs  Lab 12/28/17 1110  NA 134*  K 3.7  CL 99*  CO2 25  GLUCOSE 293*  BUN 11  CREATININE 0.71  CALCIUM 9.2  GFRNONAA >60  GFRAA >60  ANIONGAP 10    No results for input(s): PROT, ALBUMIN, AST, ALT, ALKPHOS, BILITOT in the last 168 hours. Hematology Recent Labs  Lab 12/28/17 1110  WBC 10.6*  RBC 4.28  HGB 13.9  HCT 41.2  MCV 96.3  MCH 32.5  MCHC 33.7  RDW 12.2  PLT 309   Cardiac EnzymesNo results for input(s): TROPONINI in the last 168 hours.  Recent Labs  Lab 12/28/17 1114  TROPIPOC 0.00    BNPNo results for input(s): BNP, PROBNP in the last 168 hours.  DDimer No results for input(s): DDIMER in the last 168 hours.  Radiology/Studies:  Ct Angio Chest Aorta W And/or Wo Contrast  Result Date: 12/28/2017 CLINICAL DATA:  LEFT SIDE CHEST/FLANK PAIN, MID CHEST PAIN THAT RADIATES TO BACK WITH SHORTNESS OF BREATH SINCE JUNE 1ST, HX CARDIAC STENT 2014 EXAM: CT ANGIOGRAPHY CHEST WITH CONTRAST TECHNIQUE: Multidetector CT imaging of the chest was performed using the standard protocol during bolus administration of intravenous contrast. Multiplanar CT image  reconstructions and MIPs were obtained to evaluate the vascular anatomy. CONTRAST:  131m ISOVUE-370 IOPAMIDOL (ISOVUE-370) INJECTION 76% COMPARISON:  05/02/2014 FINDINGS: Cardiovascular: Heart size normal. No effusion. Central pulmonary arteries are nondilated. Satisfactory opacification of pulmonary arteries noted, and there is no evidence of pulmonary emboli. Scattered coronary calcifications. Adequate contrast opacification of the thoracic aorta with no evidence of dissection, aneurysm, or stenosis. There is classic 3-vessel brachiocephalic arch anatomy without proximal stenosis. Nonocclusive partially calcified plaque at the origin of the left subclavian artery. Calcified plaque in the aortic arch and descending thoracic segment. Mediastinum/Nodes: Slight enlargement of anterior mediastinal node now 11 mm short axis diameter, previously 10 mm on 05/02/2014. No other mediastinal or hilar adenopathy. Lungs/Pleura: No pneumothorax. No pleural effusion. Lungs are clear. Upper Abdomen: Prominent irregular geographic region of markedly decreased attenuation in the visualized liver. No capsular or contour distortion. There is no intraparenchymal distortion, with normal vessels traversing the region. Findings are most consistent with focal fatty infiltration. Findings were reviewed with Dr. GCandise Chewho concurs. No acute findings. Musculoskeletal: No chest wall abnormality. No acute or significant osseous findings. Review of the MIP images confirms the above findings. IMPRESSION: 1. Negative for acute PE or thoracic aortic dissection. 2. Coronary and Aortic Atherosclerosis (ICD10-170.0) 3. Slight continued interval enlargement of solitary anterior mediastinal lymph node since 05/02/2014, nonspecific. 4. Probable focal fatty infiltration in the liver. Electronically Signed   By: DLucrezia EuropeM.D.   On: 12/28/2017 12:54    Assessment and Plan:   1.  Chest pain in the context of coronary artery disease with prior LAD  stent consistent with unstable angina: Coronary angiogram results from May 2014 detailed above.  She is currently on IV nitroglycerin 20 mcg/min with improvement but not complete resolution of symptoms.   She has already received ASA. I will start IV heparin. She is on Toprol-XL and Zetia (statin intolerant). She will need serial serum troponin measurements. I will obtain an echocardiogram to evaluate cardiac structure and function. [facility] Risks and benefits of cardiac catheterization have been discussed with the patient.  These include bleeding, infection, kidney damage, stroke, heart attack, death.  The patient understands these risks and is willing to proceed.  2. Hypertension: Controlled. No changes.  3. Hyperlipidemia: LDL 116 on 08/26/17. Currently on Zetia and is statin intolerant. She may be a candidate for PCSK-9 inhibitor therapy.  4. OSA on CPAP: Compliant with CPAP.    For questions or updates, please contact CBraddockPlease consult [email or web address] for contact info under Cardiology/STEMI.   Signed, SKate Sable MD  12/28/2017 2:46 PM

## 2017-12-28 NOTE — ED Provider Notes (Signed)
[facility] EMERGENCY DEPARTMENT Provider Note   CSN: [id number] Arrival date & time: 12/28/17  1057     History   Chief Complaint Chief Complaint  Patient presents with  . Chest Pain    HPI [name] is a 67 y.o. female.  Chief complaint is chest pain, back pain, intermittent nausea, and dyspnea on exertion.  HPI: 67 year old female.  History of hypertension, diabetes, heart disease.  History of known coronary artery disease status post PTCA to LAD in 2014.  Cath as follows:   Significant Diagnostic Studies: 1. Cardiac Cath 12/09/2012 Angiographic Findings:  Left main: No obstructive disease.  Left Anterior Descending Artery: Large caliber vessel with diffuse 20% proximal stenosis. The mid vessel has a discreet 95% stenosis. The distal vessel has mild plaque.  Circumflex Artery: Moderate caliber vessel with 20% proximal stenosis. The first obtuse marginal branch is moderate in caliber with no stenosis.  Right Coronary Artery: Large caliber dominant vessel with 25% mid stenosis, mild luminal irregularities distally.  Left Ventricular Angiogram: LVEF 65%.  Impression:  1. Single vessel CAD  2. Class III unstable angina  3. Preserved LV systolic function  2. PCI: 12/09/2012 1. Unstable angina (class III angina) with severe mid LAD stenosis  2. Successful PTCA/DES x 1 mid LAD 2.25 x 16 mm Promus Premier DES in the mid LAD.  Has done well.  Follows with Dr. Domenic Polite.  Is on risk factor modification with Hyzaar, amlodipine, and Lopressor, daily aspirin.  He is not known to have high cholesterol and is not on statin.  Is compliant with medications.  Last Myoview 11/17 was low risk and showed no ischemic changes.  She reports about 10 days of symptoms.  On June 1 she and her husband were driving to the beach.  She describes sudden "severe crushing" chest pain in her anterior chest that went into her back.  They pulled over.  She took aspirin and nitroglycerin x2 and it slowly  resolved.  They went on to the beach.  However she did not exert she simply "stayed in side.  If she exerts she gets shortness of breath and chest pain.  She still has intermittent episodes of nausea.  States she occasionally will wake up at night and after throw up.  She has seen GI for this in the past and had her esophagus "stretched".  Is not on a PPI or H2 blocker.  Only, she describes 6 out of 10 chest pain.  It is intermittent.  It does come and go during the course of my examination with her.  Past Medical History:  Diagnosis Date  . Anxiety   . Arthritis   . Asthma   . Barrett esophagus   . Chronic bronchitis ([place])   . Chronic diarrhea   . Chronic lower back pain   . Coronary atherosclerosis of native coronary artery    DES to LAD May 2014  . Diverticulosis   . Essential hypertension, benign   . Fatty liver   . Fibromyalgia   . GERD (gastroesophageal reflux disease)   . H/O hiatal hernia   . IBS (irritable bowel syndrome)   . Major depression in partial remission ([place])   . Migraines   . Mixed hyperlipidemia   . OSA on CPAP   . Pancreatitis 2008  . Pneumonia 1990's  . Type 2 diabetes mellitus [facility])     Patient Active Problem List   Diagnosis Date Noted  . Unstable angina ([place]) 12/28/2017  . Dysphagia   .  IBS (irritable bowel syndrome) 10/18/2015  . Diarrhea 10/11/2014  . Esophageal stricture 10/11/2014  . History of colonic polyps 10/11/2014  . Rectal bleeding 10/11/2014  . Type 2 diabetes mellitus ([place]) 12/27/2012  . Coronary atherosclerosis of native coronary artery 12/10/2012  . Mixed hyperlipidemia 05/24/2007  . Obstructive sleep apnea 05/24/2007  . Essential hypertension, benign 05/24/2007    Past Surgical History:  Procedure Laterality Date  . ABDOMINAL HYSTERECTOMY  ?1987  . APPENDECTOMY  ?1987  . BUNIONECTOMY Bilateral 1972  . CARPAL TUNNEL RELEASE Bilateral ?1990  . CATARACT EXTRACTION W/ INTRAOCULAR LENS  IMPLANT, BILATERAL Bilateral ?2005  .  CESAREAN SECTION  1985  . CHOLECYSTECTOMY  1990'S  . COLONOSCOPY  10/13   Dr. Britta Mccreedy - tubular adenomas and diverticulosis  . DILATION AND CURETTAGE OF UTERUS  1970's   "probably 3" (12/09/2012)  . ERCP  2008  . ESOPHAGEAL DILATION  ` 2012  . ESOPHAGEAL MANOMETRY N/A 10/21/2015   Procedure: ESOPHAGEAL MANOMETRY (EM);  Surgeon: Doran Stabler, MD;  Location: [facility] ENDOSCOPY;  Service: Gastroenterology;  Laterality: N/A;  . HUMERUS FRACTURE SURGERY Right 07/19/1969   "horst threw me" (12/09/2012)  . NASAL SEPTUM SURGERY  1970's  . PERCUTANEOUS CORONARY STENT INTERVENTION (PCI-S) N/A 12/09/2012   Procedure: PERCUTANEOUS CORONARY STENT INTERVENTION (PCI-S);  Surgeon: Burnell Blanks, MD;  Location: [facility] CATH LAB;  Service: Cardiovascular;  Laterality: N/A;  . TOE FUSION Right 10/11   First MTP joint  . TOE SURGERY Right 1990's   "toe next to big toe:  dr said I had tumor; cut bone & stuff; another OR dr stretched tendons, etc" (12/09/2012)  . TUBAL LIGATION  1985     OB History   None      Home Medications    Prior to Admission medications   Medication Sig Start Date End Date Taking? Authorizing Provider  acetaminophen (TYLENOL) 500 MG tablet Take 1,000 mg by mouth every 8 (eight) hours as needed for fever.   Yes [provider]  albuterol (PROVENTIL HFA;VENTOLIN HFA) 108 (90 Base) MCG/ACT inhaler Inhale 2 puffs into the lungs every 4 (four) hours as needed. 12/15/17  Yes [provider]  ALPRAZolam (XANAX) 1 MG tablet TAKE 1 TABLET BY MOUTH AT BEDTIME AS NEEDED FOR ANXIETY 10/22/15  Yes [provider]  amLODipine (NORVASC) 5 MG tablet TAKE 1 AND 1/2 TABLETS BY MOUTH EVERY DAY 12/10/17  Yes [name], MD  aspirin EC 81 MG tablet Take 81 mg by mouth daily.   Yes [provider]  doxycycline (VIBRA-TABS) 100 MG tablet Take 1 tablet by mouth 2 (two) times daily. 12/27/17  Yes [provider]  DULoxetine (CYMBALTA) 60 MG capsule Take 60  mg by mouth daily.  08/15/15  Yes [provider]  ezetimibe (ZETIA) 10 MG tablet TAKE 1 TABLET BY MOUTH EVERY DAY 04/29/17  Yes [name], MD  Guaifenesin [facility] MAXIMUM STRENGTH) 1200 MG TB12 Take 1 tablet by mouth 2 (two) times daily as needed.   Yes [provider]  HYDROcodone-acetaminophen (NORCO/VICODIN) 5-325 MG tablet Take 1 tablet twice a day as needed 10/22/15  Yes [provider]  loratadine (CLARITIN) 10 MG tablet Take 10 mg by mouth daily as needed for allergies.   Yes [provider]  losartan-hydrochlorothiazide (HYZAAR) 100-25 MG tablet Take 1 tablet by mouth daily.  08/15/15  Yes [provider]  metoprolol tartrate (LOPRESSOR) 50 MG tablet TAKE 1 TABLET BY MOUTH TWICE DAILY 02/01/17  Yes [name], MD  montelukast (SINGULAIR) 10 MG tablet Take 10 mg by mouth at bedtime.  08/15/15  Yes [provider]  nitroGLYCERIN (NITROSTAT) 0.4 MG SL tablet Place 0.4 mg under the tongue every 5 (five) minutes as needed for chest pain.   Yes [provider]  promethazine (PHENERGAN) 25 MG tablet Take 25 mg by mouth every 6 (six) hours as needed for nausea or vomiting.   Yes [provider]  traZODone (DESYREL) 50 MG tablet Take 200 mg by mouth at bedtime. 4 tabs hs 08/15/15  Yes [provider]  TRESIBA FLEXTOUCH 200 UNIT/ML SOPN Inject 130 Units into the skin at bedtime. As directed 10/28/15  Yes [provider]  predniSONE (DELTASONE) 50 MG tablet Take 50 mg by mouth daily with breakfast.    [provider]    Family History Family History  Problem Relation Age of Onset  . Stroke Mother   . Cirrhosis Father   . Hypertension Father   . Esophageal cancer Sister   . Colon polyps Sister   . Prostate cancer Brother   . Colon cancer Neg Hx     Social History Social History   Tobacco Use  . Smoking status: Former Smoker    Packs/day: 0.80    Years: 15.00    Pack years: 12.00     Types: Cigarettes    Start date: 07/20/1976    Last attempt to quit: 07/20/1993    Years since quitting: 24.4  . Smokeless tobacco: Never Used  Substance Use Topics  . Alcohol use: No    Alcohol/week: 0.0 oz  . Drug use: No     Allergies   Erythromycin base; Azithromycin; Other; Pollen extract; Remeron [mirtazapine]; Tree extract; Zolpidem tartrate; and Sulfonamide derivatives   Review of Systems Review of Systems  Constitutional: Negative for appetite change, chills, diaphoresis, fatigue and fever.  HENT: Negative for mouth sores, sore throat and trouble swallowing.   Eyes: Negative for visual disturbance.  Respiratory: Positive for shortness of breath. Negative for cough, chest tightness and wheezing.   Cardiovascular: Positive for chest pain.       Dyspnea on exertion  Gastrointestinal: Negative for abdominal distention, abdominal pain, diarrhea, nausea and vomiting.  Endocrine: Negative for polydipsia, polyphagia and polyuria.  Genitourinary: Negative for dysuria, frequency and hematuria.  Musculoskeletal: Negative for gait problem.  Skin: Negative for color change, pallor and rash.  Neurological: Negative for dizziness, syncope, light-headedness and headaches.  Hematological: Does not bruise/bleed easily.  Psychiatric/Behavioral: Negative for behavioral problems and confusion.     Physical Exam Updated Vital Signs BP 120/62   Pulse (!) 57   Resp 13   Ht 5\' 2"  (1.575 m)   Wt 70.3 kg (155 lb)   SpO2 95%   BMI 28.35 kg/m   Physical Exam  Constitutional: She is oriented to person, place, and time. She appears well-developed and well-nourished. No distress.  HENT:  Head: Normocephalic.  Eyes: Pupils are equal, round, and reactive to light. Conjunctivae are normal. No scleral icterus.  Neck: Normal range of motion. Neck supple. No thyromegaly present.  Cardiovascular: Normal rate and regular rhythm. Exam reveals no gallop and no friction rub.  No murmur  heard. Regular.  Sinus with occasional PACs on the monitor.  BP 169/78.  Pulmonary/Chest: Effort normal and breath sounds normal. No respiratory distress. She has no wheezes. She has no rales.  Clear bilateral breath sounds.  Abdominal: Soft. Bowel sounds are normal. She exhibits no  distension. There is no tenderness. There is no rebound.  Musculoskeletal: Normal range of motion.  Neurological: She is alert and oriented to person, place, and time.  Skin: Skin is warm and dry. No rash noted.  Psychiatric: She has a normal mood and affect. Her behavior is normal.     ED Treatments / Results  Labs (all labs ordered are listed, but only abnormal results are displayed) Labs Reviewed  BASIC METABOLIC PANEL - Abnormal; Notable for the following components:      Result Value   Sodium 134 (*)    Chloride 99 (*)    Glucose, Bld 293 (*)    All other components within normal limits  CBC - Abnormal; Notable for the following components:   WBC 10.6 (*)    All other components within normal limits  I-STAT TROPONIN, ED  I-STAT TROPONIN, ED    EKG EKG Interpretation  Date/Time:  Tuesday December 28 2017 11:06:12 EDT Ventricular Rate:  63 PR Interval:    QRS Duration: 98 QT Interval:  457 QTC Calculation: 468 R Axis:   41 Text Interpretation:  Sinus rhythm Probable left atrial enlargement Baseline wander in lead(s) II III aVF V1 RSR' V1-V2 no Change Confirmed by Tanna Furry [phone]) on 12/28/2017 11:10:52 AM   Radiology Ct Angio Chest Aorta W And/or Wo Contrast  Result Date: 12/28/2017 CLINICAL DATA:  LEFT SIDE CHEST/FLANK PAIN, MID CHEST PAIN THAT RADIATES TO BACK WITH SHORTNESS OF BREATH SINCE JUNE 1ST, HX CARDIAC STENT 2014 EXAM: CT ANGIOGRAPHY CHEST WITH CONTRAST TECHNIQUE: Multidetector CT imaging of the chest was performed using the standard protocol during bolus administration of intravenous contrast. Multiplanar CT image reconstructions and MIPs were obtained to evaluate the vascular  anatomy. CONTRAST:  149mL ISOVUE-370 IOPAMIDOL (ISOVUE-370) INJECTION 76% COMPARISON:  05/02/2014 FINDINGS: Cardiovascular: Heart size normal. No effusion. Central pulmonary arteries are nondilated. Satisfactory opacification of pulmonary arteries noted, and there is no evidence of pulmonary emboli. Scattered coronary calcifications. Adequate contrast opacification of the thoracic aorta with no evidence of dissection, aneurysm, or stenosis. There is classic 3-vessel brachiocephalic arch anatomy without proximal stenosis. Nonocclusive partially calcified plaque at the origin of the left subclavian artery. Calcified plaque in the aortic arch and descending thoracic segment. Mediastinum/Nodes: Slight enlargement of anterior mediastinal node now 11 mm short axis diameter, previously 10 mm on 05/02/2014. No other mediastinal or hilar adenopathy. Lungs/Pleura: No pneumothorax. No pleural effusion. Lungs are clear. Upper Abdomen: Prominent irregular geographic region of markedly decreased attenuation in the visualized liver. No capsular or contour distortion. There is no intraparenchymal distortion, with normal vessels traversing the region. Findings are most consistent with focal fatty infiltration. Findings were reviewed with Dr. Candise Che who concurs. No acute findings. Musculoskeletal: No chest wall abnormality. No acute or significant osseous findings. Review of the MIP images confirms the above findings. IMPRESSION: 1. Negative for acute PE or thoracic aortic dissection. 2. Coronary and Aortic Atherosclerosis (ICD10-170.0) 3. Slight continued interval enlargement of solitary anterior mediastinal lymph node since 05/02/2014, nonspecific. 4. Probable focal fatty infiltration in the liver. Electronically Signed   By: Lucrezia Europe M.D.   On: 12/28/2017 12:54    Procedures Procedures (including critical care time)  Medications Ordered in ED Medications  famotidine (PEPCID) IVPB 20 mg premix (0 mg Intravenous Stopped  12/28/17 1220)  morphine 4 MG/ML injection 4 mg (4 mg Intravenous Given 12/28/17 1347)  nitroGLYCERIN 50 mg in dextrose 5 % 250 mL (0.2 mg/mL) infusion (20 mcg/min Intravenous Rate/Dose Change 12/28/17 1352)  aspirin chewable tablet 324 mg (324 mg Oral Given 12/28/17 1143)  ondansetron (ZOFRAN) injection 4 mg (4 mg Intravenous Given 12/28/17 1144)  iopamidol (ISOVUE-370) 76 % injection 100 mL (100 mLs Intravenous Contrast Given 12/28/17 1225)     Initial Impression / Assessment and Plan / ED Course  I have reviewed the triage vital signs and the nursing notes.  Pertinent labs & imaging results that were available during my care of the patient were reviewed by me and considered in my medical decision making (see chart for details).     EKG Interpretation  Date/Time:  Tuesday December 28 2017 11:06:12 EDT Ventricular Rate:  63 PR Interval:    QRS Duration: 98 QT Interval:  457 QTC Calculation: 468 R Axis:   41 Text Interpretation:  Sinus rhythm Probable left atrial enlargement Baseline wander in lead(s) II III aVF V1 RSR' V1-V2 no Change Confirmed by Tanna Furry [phone]) on 12/28/2017 11:10:52 AM EKG unchanged versus comparison.  No acute or ischemic changes noted.   Chest pain that is concerning for recurrent angina.  Some GI component to with her intermittent vomiting at night and occasional nausea.  She has benign abdominal exam.  No significant ulcer risks.  No alcohol use.  Non-smoker.  No anti-inflammatories.  No excess caffeine.  Aspirin, nitro, H2 blocker, enzymes.  Will discuss with cardiology after completion of above.  Patient is on nitroglycerin drip.  Had titrated up to 20 mics.  Blood pressure improved.  Now 110.  Chest pain is gone.  Minimal back pain persists.  No aortic abnormalities on CT.  No PE.  Discussed with Dr. Bronson Ing.  We discussed the patient he has reviewed her chart, and past history.  He feels the patient could be admitted here.  He states that they will see the  patient on the floor.  Admission orders have been requested and placed by myself.  CRITICAL CARE Performed by: Lolita Patella   Total critical care time: 30 minutes  Critical care time was exclusive of separately billable procedures and treating other patients.  Critical care was necessary to treat or prevent imminent or life-threatening deterioration.  Critical care was time spent personally by me on the following activities: development of treatment plan with patient and/or surrogate as well as nursing, discussions with consultants, evaluation of patient's response to treatment, examination of patient, obtaining history from patient or surrogate, ordering and performing treatments and interventions, ordering and review of laboratory studies, ordering and review of radiographic studies, pulse oximetry and re-evaluation of patient's condition.   Final Clinical Impressions(s) / ED Diagnoses   Final diagnoses:  Unstable angina [facility])    ED Discharge Orders    None       Tanna Furry, MD 12/28/17 1440

## 2017-12-28 NOTE — H&P (View-Only) (Signed)
Cardiology Consultation:   Patient ID: [name]; [id number]; December 30, 1950   Admit date: 12/28/2017 Date of Consult: 12/28/2017  Primary Care Provider: Caryl Bis, MD Primary Cardiologist: Dr. Johnny Bridge   Patient Profile:   [name] is a 67 y.o. female with a hx of coronary artery disease who is being seen today for the evaluation of chest pain at the request of Dr. Tanna Furry.  History of Present Illness:   [name] is a 67 year old woman with a history of coronary artery disease with prior drug-eluting stent placement to the LAD in 2014.  She was last evaluated by Dr. Domenic Polite in the office on 04/23/2017 at which time she was doing well.  She underwent a low risk nuclear stress test on 06/05/2016 with no reversible defects to indicate ischemia, LVEF 71%.  She also has a history of type 2 diabetes mellitus, obstructive sleep apnea on CPAP, mild bilateral carotid artery disease, and hyperlipidemia with statin intolerance.  Coronary angiogram on 12/09/2012 showed no obstructive left main disease, 20% proximal left circumflex coronary artery stenosis, and mid RCA to 5% stenosis.  This was at the time she underwent struggling stent placement for a mid LAD 95% stenosis.  The proximal LAD had diffuse 20% stenosis.  She called our office on 11/01/2017 with complaints of fatigue and elevated blood pressure with headaches.  She started doing tai chi at that time.  A review of blood pressures at that time appear to be consistently elevated based on a phone note on 11/01/2017.  Carotid artery Dopplers were obtained which showed 1 to 39% mild bilateral disease.  It was also recommended that she follow-up with her PCP regarding blood pressure management.  She saw her PCP (Dr. Quillian Quince) during the last week of May and was diagnosed and treated for an "asthma attack and sinus infection" with steroids and antibiotics.  She called our office this morning stating that on June 1 she was on her way to  the beach and started to have chest pain.  She apparently drank some Sprite and took 2 nitroglycerin and a baby aspirin at that time. She said since that time she has been having chest pain and it has been intermittent over the past week. Today it radiated to her back.  She said on June 1, it was the worst pain she had ever experienced.  I personally reviewed the ECG performed today which demonstrates sinus rhythm, 63 bpm, with no acute ischemic changes.  I reviewed the CT angiogram of the chest which was negative for acute pulmonary embolism or thoracic aortic dissection.  There was probable focal fatty infiltration in the liver and a slight continued interval enlargement of a solitary anterior mediastinal lymph node which was deemed to be nonspecific.  Coronary and aortic atherosclerosis was also seen.  Point-of-care troponin was normal.  CBC notable for mildly elevated white count of 10.6.  Additional labs include sodium 134, potassium 3.7, chloride 99, BUN 11, creatinine 0.71.  She was started on IV nitroglycerin which is currently at 20 mcg/min. The nurse tried turning it down earlier but chest pain recurred. Chest pain is now present but milder in intensity.    Past Medical History:  Diagnosis Date  . Anxiety   . Arthritis   . Asthma   . Barrett esophagus   . Chronic bronchitis ([place])   . Chronic diarrhea   . Chronic lower back pain   . Coronary atherosclerosis of native coronary artery  DES to LAD May 2014  . Diverticulosis   . Essential hypertension, benign   . Fatty liver   . Fibromyalgia   . GERD (gastroesophageal reflux disease)   . H/O hiatal hernia   . IBS (irritable bowel syndrome)   . Major depression in partial remission ([place])   . Migraines   . Mixed hyperlipidemia   . OSA on CPAP   . Pancreatitis 2008  . Pneumonia 1990's  . Type 2 diabetes mellitus ([place])     Past Surgical History:  Procedure Laterality Date  . ABDOMINAL HYSTERECTOMY  ?1987  . APPENDECTOMY   ?1987  . BUNIONECTOMY Bilateral 1972  . CARPAL TUNNEL RELEASE Bilateral ?1990  . CATARACT EXTRACTION W/ INTRAOCULAR LENS  IMPLANT, BILATERAL Bilateral ?2005  . CESAREAN SECTION  1985  . CHOLECYSTECTOMY  1990'S  . COLONOSCOPY  10/13   Dr. Britta Mccreedy - tubular adenomas and diverticulosis  . DILATION AND CURETTAGE OF UTERUS  1970's   "probably 3" (12/09/2012)  . ERCP  2008  . ESOPHAGEAL DILATION  ` 2012  . ESOPHAGEAL MANOMETRY N/A 10/21/2015   Procedure: ESOPHAGEAL MANOMETRY (EM);  Surgeon: Doran Stabler, MD;  Location: [facility];  Service: Gastroenterology;  Laterality: N/A;  . HUMERUS FRACTURE SURGERY Right 07/19/1969   "horst threw me" (12/09/2012)  . NASAL SEPTUM SURGERY  1970's  . PERCUTANEOUS CORONARY STENT INTERVENTION (PCI-S) N/A 12/09/2012   Procedure: PERCUTANEOUS CORONARY STENT INTERVENTION (PCI-S);  Surgeon: Burnell Blanks, MD;  Location: [facility] CATH LAB;  Service: Cardiovascular;  Laterality: N/A;  . TOE FUSION Right 10/11   First MTP joint  . TOE SURGERY Right 1990's   "toe next to big toe:  dr said I had tumor; cut bone & stuff; another OR dr stretched tendons, etc" (12/09/2012)  . TUBAL LIGATION  1985       Inpatient Medications: Scheduled Meds:  Continuous Infusions: . famotidine (PEPCID) IV Stopped (12/28/17 1220)   PRN Meds: morphine injection  Allergies:    Allergies  Allergen Reactions  . Erythromycin Base Diarrhea  . Azithromycin Other (See Comments)    Upset stomach   . Other     Grass, dust, green beans, corn, cabbage  . Pollen Extract   . Remeron [Mirtazapine] Other (See Comments)    Makes her too sleepy  . Tree Extract   . Zolpidem Tartrate Other (See Comments)    amnesia  . Sulfonamide Derivatives Rash    Mouth sores    Social History:   Social History   Socioeconomic History  . Marital status: Married    Spouse name: Konrad Dolores  . Number of children: 2  . Years of education: Not on file  . Highest education level: Not on file    Occupational History  . Occupation: Retired  [employer]  Social Needs  . Financial resource strain: Not on file  . Food insecurity:    Worry: Not on file    Inability: Not on file  . Transportation needs:    Medical: Not on file    Non-medical: Not on file  Tobacco Use  . Smoking status: Former Smoker    Packs/day: 0.80    Years: 15.00    Pack years: 12.00    Types: Cigarettes    Start date: 07/20/1976    Last attempt to quit: 07/20/1993    Years since quitting: 24.4  . Smokeless tobacco: Never Used  Substance and Sexual Activity  . Alcohol use: No    Alcohol/week: 0.0 oz  .  Drug use: No  . Sexual activity: Yes  Lifestyle  . Physical activity:    Days per week: Not on file    Minutes per session: Not on file  . Stress: Not on file  Relationships  . Social connections:    Talks on phone: Not on file    Gets together: Not on file    Attends religious service: Not on file    Active member of club or organization: Not on file    Attends meetings of clubs or organizations: Not on file    Relationship status: Not on file  . Intimate partner violence:    Fear of current or ex partner: Not on file    Emotionally abused: Not on file    Physically abused: Not on file    Forced sexual activity: Not on file  Other Topics Concern  . Not on file  Social History Narrative  . Not on file    Family History:    Family History  Problem Relation Age of Onset  . Stroke Mother   . Cirrhosis Father   . Hypertension Father   . Esophageal cancer Sister   . Colon polyps Sister   . Prostate cancer Brother   . Colon cancer Neg Hx      ROS:  Please see the history of present illness.   All other ROS reviewed and negative.     Physical Exam/Data:   Vitals:   12/28/17 1300 12/28/17 1330 12/28/17 1400 12/28/17 1430  BP: 116/69 126/62 120/62 104/60  Pulse: (!) 57 (!) 56 (!) 57 (!) 54  Resp: '19 16 13 15  ' SpO2: 94% 93% 95% 93%  Weight:      Height:       No intake or output data in  the 24 hours ending 12/28/17 1446 Filed Weights   12/28/17 1104  Weight: 155 lb (70.3 kg)   Body mass index is 28.35 kg/m.  General:  Well nourished, well developed, in no acute distress HEENT: normal Lymph: no adenopathy Neck: no JVD Endocrine:  No thryomegaly Cardiac:  normal S1, S2; RRR; no murmur  Lungs:  clear to auscultation bilaterally, no wheezing, rhonchi or rales  Abd: soft, nontender, no hepatomegaly  Ext: no edema Musculoskeletal:  No deformities, BUE and BLE strength normal and equal Skin: warm and dry  Neuro:  CNs 2-12 intact, no focal abnormalities noted Psych:  Normal affect   EKG:  Reviewed above  Telemetry:  Telemetry was personally reviewed and demonstrates:  Sinus rhythm  Relevant CV Studies: Coronary angiography and stress test reviewed above  Laboratory Data:  Chemistry Recent Labs  Lab 12/28/17 1110  NA 134*  K 3.7  CL 99*  CO2 25  GLUCOSE 293*  BUN 11  CREATININE 0.71  CALCIUM 9.2  GFRNONAA >60  GFRAA >60  ANIONGAP 10    No results for input(s): PROT, ALBUMIN, AST, ALT, ALKPHOS, BILITOT in the last 168 hours. Hematology Recent Labs  Lab 12/28/17 1110  WBC 10.6*  RBC 4.28  HGB 13.9  HCT 41.2  MCV 96.3  MCH 32.5  MCHC 33.7  RDW 12.2  PLT 309   Cardiac EnzymesNo results for input(s): TROPONINI in the last 168 hours.  Recent Labs  Lab 12/28/17 1114  TROPIPOC 0.00    BNPNo results for input(s): BNP, PROBNP in the last 168 hours.  DDimer No results for input(s): DDIMER in the last 168 hours.  Radiology/Studies:  Ct Angio Chest Aorta W And/or Wo Contrast  Result Date: 12/28/2017 CLINICAL DATA:  LEFT SIDE CHEST/FLANK PAIN, MID CHEST PAIN THAT RADIATES TO BACK WITH SHORTNESS OF BREATH SINCE JUNE 1ST, HX CARDIAC STENT 2014 EXAM: CT ANGIOGRAPHY CHEST WITH CONTRAST TECHNIQUE: Multidetector CT imaging of the chest was performed using the standard protocol during bolus administration of intravenous contrast. Multiplanar CT image  reconstructions and MIPs were obtained to evaluate the vascular anatomy. CONTRAST:  16m ISOVUE-370 IOPAMIDOL (ISOVUE-370) INJECTION 76% COMPARISON:  05/02/2014 FINDINGS: Cardiovascular: Heart size normal. No effusion. Central pulmonary arteries are nondilated. Satisfactory opacification of pulmonary arteries noted, and there is no evidence of pulmonary emboli. Scattered coronary calcifications. Adequate contrast opacification of the thoracic aorta with no evidence of dissection, aneurysm, or stenosis. There is classic 3-vessel brachiocephalic arch anatomy without proximal stenosis. Nonocclusive partially calcified plaque at the origin of the left subclavian artery. Calcified plaque in the aortic arch and descending thoracic segment. Mediastinum/Nodes: Slight enlargement of anterior mediastinal node now 11 mm short axis diameter, previously 10 mm on 05/02/2014. No other mediastinal or hilar adenopathy. Lungs/Pleura: No pneumothorax. No pleural effusion. Lungs are clear. Upper Abdomen: Prominent irregular geographic region of markedly decreased attenuation in the visualized liver. No capsular or contour distortion. There is no intraparenchymal distortion, with normal vessels traversing the region. Findings are most consistent with focal fatty infiltration. Findings were reviewed with Dr. GCandise Chewho concurs. No acute findings. Musculoskeletal: No chest wall abnormality. No acute or significant osseous findings. Review of the MIP images confirms the above findings. IMPRESSION: 1. Negative for acute PE or thoracic aortic dissection. 2. Coronary and Aortic Atherosclerosis (ICD10-170.0) 3. Slight continued interval enlargement of solitary anterior mediastinal lymph node since 05/02/2014, nonspecific. 4. Probable focal fatty infiltration in the liver. Electronically Signed   By: DLucrezia EuropeM.D.   On: 12/28/2017 12:54    Assessment and Plan:   1.  Chest pain in the context of coronary artery disease with prior LAD  stent consistent with unstable angina: Coronary angiogram results from May 2014 detailed above.  She is currently on IV nitroglycerin 20 mcg/min with improvement but not complete resolution of symptoms.   She has already received ASA. I will start IV heparin. She is on Toprol-XL and Zetia (statin intolerant). She will need serial serum troponin measurements. I will obtain an echocardiogram to evaluate cardiac structure and function. [facility] Risks and benefits of cardiac catheterization have been discussed with the patient.  These include bleeding, infection, kidney damage, stroke, heart attack, death.  The patient understands these risks and is willing to proceed.  2. Hypertension: Controlled. No changes.  3. Hyperlipidemia: LDL 116 on 08/26/17. Currently on Zetia and is statin intolerant. She may be a candidate for PCSK-9 inhibitor therapy.  4. OSA on CPAP: Compliant with CPAP.    For questions or updates, please contact CSundownPlease consult [email or web address] for contact info under Cardiology/STEMI.   Signed, SKate Sable MD  12/28/2017 2:46 PM

## 2017-12-29 ENCOUNTER — Inpatient Hospital Stay (HOSPITAL_COMMUNITY)
Admission: EM | Disposition: A | Discharge status: Home / Self Care | Payer: Self-pay | Source: Home / Self Care | Attending: Internal Medicine

## 2017-12-29 ENCOUNTER — Encounter (HOSPITAL_COMMUNITY): Payer: Self-pay | Admitting: Cardiology

## 2017-12-29 HISTORY — PX: LEFT HEART CATH AND CORONARY ANGIOGRAPHY: CATH118249

## 2017-12-29 LAB — LIPID PANEL
Cholesterol: 160 mg/dL (ref 0–200)
HDL: 40 mg/dL — ABNORMAL LOW (ref >40)
LDL CALC: 82 mg/dL (ref 0–99)
Total CHOL/HDL Ratio: 4 RATIO
Triglycerides: 191 mg/dL — ABNORMAL HIGH (ref <150)
VLDL: 38 mg/dL (ref 0–40)

## 2017-12-29 LAB — TROPONIN I: Troponin I: <0.03 ng/mL (ref <0.03)

## 2017-12-29 LAB — CBC
HEMATOCRIT: 36.5 % (ref 36.0–46.0)
Hemoglobin: 12.3 g/dL (ref 12.0–15.0)
MCH: 31.9 pg (ref 26.0–34.0)
MCHC: 33.7 g/dL (ref 30.0–36.0)
MCV: 94.8 fL (ref 78.0–100.0)
PLATELETS: 274 10*3/uL (ref 150–400)
RBC: 3.85 MIL/uL — ABNORMAL LOW (ref 3.87–5.11)
RDW: 11.8 % (ref 11.5–15.5)
WBC: 13 10*3/uL — ABNORMAL HIGH (ref 4.0–10.5)

## 2017-12-29 LAB — BASIC METABOLIC PANEL
Anion gap: 9 (ref 5–15)
BUN: 9 mg/dL (ref 6–20)
CHLORIDE: 98 mmol/L — AB (ref 101–111)
CO2: 27 mmol/L (ref 22–32)
Calcium: 8.8 mg/dL — ABNORMAL LOW (ref 8.9–10.3)
Creatinine, Ser: 0.85 mg/dL (ref 0.44–1.00)
GFR calc Af Amer: >60 mL/min (ref >60)
GLUCOSE: 259 mg/dL — AB (ref 65–99)
POTASSIUM: 3.2 mmol/L — AB (ref 3.5–5.1)
Sodium: 134 mmol/L — ABNORMAL LOW (ref 135–145)

## 2017-12-29 LAB — GLUCOSE, CAPILLARY
GLUCOSE-CAPILLARY: 208 mg/dL — AB (ref 65–99)
Glucose-Capillary: 122 mg/dL — ABNORMAL HIGH (ref 65–99)
Glucose-Capillary: 168 mg/dL — ABNORMAL HIGH (ref 65–99)
Glucose-Capillary: 309 mg/dL — ABNORMAL HIGH (ref 65–99)

## 2017-12-29 LAB — HIV ANTIBODY (ROUTINE TESTING W REFLEX): HIV Screen 4th Generation wRfx: NONREACTIVE

## 2017-12-29 LAB — HEPARIN LEVEL (UNFRACTIONATED): Heparin Unfractionated: 0.23 IU/mL — ABNORMAL LOW (ref 0.30–0.70)

## 2017-12-29 SURGERY — LEFT HEART CATH AND CORONARY ANGIOGRAPHY
Anesthesia: LOCAL

## 2017-12-29 MED ORDER — IOPAMIDOL (ISOVUE-370) INJECTION 76%
INTRAVENOUS | Status: AC
  Filled 2017-12-29: qty 100

## 2017-12-29 MED ORDER — IOPAMIDOL (ISOVUE-370) INJECTION 76%
INTRAVENOUS | Status: DC | PRN
  Administered 2017-12-29: 60 mL via INTRA_ARTERIAL

## 2017-12-29 MED ORDER — INSULIN ASPART 100 UNIT/ML ~~LOC~~ SOLN: 0.0000 [IU] | Freq: Three times a day (TID) | SUBCUTANEOUS | Status: DC

## 2017-12-29 MED ORDER — VERAPAMIL HCL 2.5 MG/ML IV SOLN
INTRAVENOUS | Status: AC
  Filled 2017-12-29: qty 2

## 2017-12-29 MED ORDER — MIDAZOLAM HCL 2 MG/2ML IJ SOLN
INTRAMUSCULAR | Status: AC
  Filled 2017-12-29: qty 2

## 2017-12-29 MED ORDER — INSULIN ASPART 100 UNIT/ML ~~LOC~~ SOLN
0.0000 [IU] | Freq: Every day | SUBCUTANEOUS | Status: DC
  Administered 2017-12-29: 4 [IU] via SUBCUTANEOUS

## 2017-12-29 MED ORDER — HEPARIN (PORCINE) IN NACL 1000-0.9 UT/500ML-% IV SOLN
INTRAVENOUS | Status: AC
  Filled 2017-12-29: qty 1000

## 2017-12-29 MED ORDER — HEPARIN SODIUM (PORCINE) 1000 UNIT/ML IJ SOLN
INTRAMUSCULAR | Status: AC
  Filled 2017-12-29: qty 1

## 2017-12-29 MED ORDER — FENTANYL CITRATE (PF) 100 MCG/2ML IJ SOLN
INTRAMUSCULAR | Status: DC | PRN
  Administered 2017-12-29: 50 ug via INTRAVENOUS

## 2017-12-29 MED ORDER — SODIUM CHLORIDE 0.9 % IV SOLN: 250.0000 mL | INTRAVENOUS | Status: DC | PRN

## 2017-12-29 MED ORDER — LIDOCAINE HCL (PF) 1 % IJ SOLN
INTRAMUSCULAR | Status: AC
  Filled 2017-12-29: qty 30

## 2017-12-29 MED ORDER — HEPARIN (PORCINE) IN NACL 2-0.9 UNITS/ML
INTRAMUSCULAR | Status: AC | PRN
  Administered 2017-12-29 (×2): 500 mL

## 2017-12-29 MED ORDER — SODIUM CHLORIDE 0.9% FLUSH
3.0000 mL | Freq: Two times a day (BID) | INTRAVENOUS | Status: DC
  Administered 2017-12-29 – 2017-12-30 (×2): 3 mL via INTRAVENOUS

## 2017-12-29 MED ORDER — FENTANYL CITRATE (PF) 100 MCG/2ML IJ SOLN
INTRAMUSCULAR | Status: AC
  Filled 2017-12-29: qty 2

## 2017-12-29 MED ORDER — HEPARIN SODIUM (PORCINE) 1000 UNIT/ML IJ SOLN
INTRAMUSCULAR | Status: DC | PRN
  Administered 2017-12-29: 4000 [IU] via INTRAVENOUS

## 2017-12-29 MED ORDER — POTASSIUM CHLORIDE CRYS ER 20 MEQ PO TBCR
40.0000 meq | EXTENDED_RELEASE_TABLET | Freq: Once | ORAL | Status: AC
  Administered 2017-12-29: 40 meq via ORAL
  Filled 2017-12-29: qty 2

## 2017-12-29 MED ORDER — SODIUM CHLORIDE 0.9 % IV SOLN: INTRAVENOUS | Status: AC

## 2017-12-29 MED ORDER — MIDAZOLAM HCL 2 MG/2ML IJ SOLN
INTRAMUSCULAR | Status: DC | PRN
  Administered 2017-12-29: 1 mg via INTRAVENOUS

## 2017-12-29 MED ORDER — VERAPAMIL HCL 2.5 MG/ML IV SOLN
INTRAVENOUS | Status: DC | PRN
  Administered 2017-12-29: 10 mL via INTRA_ARTERIAL

## 2017-12-29 MED ORDER — SODIUM CHLORIDE 0.9% FLUSH: 3.0000 mL | INTRAVENOUS | Status: DC | PRN

## 2017-12-29 MED ORDER — LIDOCAINE HCL (PF) 1 % IJ SOLN
INTRAMUSCULAR | Status: DC | PRN
  Administered 2017-12-29: 2 mL via INTRADERMAL

## 2017-12-29 SURGICAL SUPPLY — 13 items
CATH INFINITI 5 FR JL3.5 (CATHETERS) ×2 IMPLANT
CATH INFINITI 5FR JK (CATHETERS) ×2 IMPLANT
COVER PRB 48X5XTLSCP FOLD TPE (BAG) ×1 IMPLANT
COVER PROBE 5X48 (BAG) ×1
DEVICE RAD COMP TR BAND LRG (VASCULAR PRODUCTS) ×2 IMPLANT
GUIDEWIRE INQWIRE 1.5J.035X260 (WIRE) ×1 IMPLANT
INQWIRE 1.5J .035X260CM (WIRE) ×2
KIT HEART LEFT (KITS) ×2 IMPLANT
NEEDLE PERC 21GX4CM (NEEDLE) ×2 IMPLANT
PACK CARDIAC CATHETERIZATION (CUSTOM PROCEDURE TRAY) ×2 IMPLANT
SHEATH RAIN RADIAL 21G 6FR (SHEATH) ×2 IMPLANT
TRANSDUCER W/STOPCOCK (MISCELLANEOUS) ×2 IMPLANT
TUBING CIL FLEX 10 FLL-RA (TUBING) ×2 IMPLANT

## 2017-12-29 NOTE — Progress Notes (Signed)
Inpatient Diabetes Program Recommendations  AACE/ADA: New Consensus Statement on Inpatient Glycemic Control (2015)  Target Ranges:  Prepandial:   less than 140 mg/dL      Peak postprandial:   less than 180 mg/dL (1-2 hours)      Critically ill patients:  140 - 180 mg/dL   Lab Results  Component Value Date   GLUCAP 122 (H) 12/29/2017   HGBA1C 9.4 (H) 12/28/2017    Review of Glycemic Control Results for [name], [name] (MRN [id number]) as of 12/29/2017 13:20  Ref. Range 12/28/2017 17:42 12/28/2017 20:57 12/29/2017 07:39 12/29/2017 11:31  Glucose-Capillary Latest Ref Range: 65 - 99 mg/dL 227 (H) 174 (H) 208 (H) 122 (H)   Diabetes history: Type 2 DM  Outpatient Diabetes medications: Tresiba 130 units q HS Current orders for Inpatient glycemic control:  Novolog moderate tid with meals   Inpatient Diabetes Program Recommendations:   May consider adding a portion of basal insulin while in the hospital.  Consider adding Lantus 25 units daily while in the hospital.   Thanks,  Adah Perl, RN, BC-ADM Inpatient Diabetes Coordinator Pager [phone] (8a-5p)

## 2017-12-29 NOTE — Progress Notes (Signed)
Progress Note  Patient Name: [name] Date of Encounter: 12/29/2017  Primary Cardiologist: Rozann Lesches, MD   Subjective   Still with chest pressure on IV NTG and Heparin--for cath at 1030  Also some back pain  Inpatient Medications    Scheduled Meds: . amLODipine  7.5 mg Oral Daily  . aspirin EC  81 mg Oral Daily  . atorvastatin  80 mg Oral q1800  . DULoxetine  60 mg Oral Daily  . ezetimibe  10 mg Oral Daily  . insulin aspart  0-15 Units Subcutaneous TID WC  . metoprolol tartrate  50 mg Oral BID  . montelukast  10 mg Oral QHS  . sodium chloride flush  3 mL Intravenous Q12H   Continuous Infusions: . sodium chloride    . sodium chloride 1 mL/kg/hr (12/29/17 0833)  . famotidine (PEPCID) IV 20 mg (12/29/17 0835)  . heparin 1,050 Units/hr (12/29/17 0353)  . nitroGLYCERIN 30 mcg/min (12/28/17 2130)   PRN Meds: sodium chloride, acetaminophen, albuterol, ALPRAZolam, HYDROcodone-acetaminophen, nitroGLYCERIN, ondansetron (ZOFRAN) IV, sodium chloride flush   Vital Signs    Vitals:   12/29/17 0409 12/29/17 0617 12/29/17 0746 12/29/17 0834  BP:  135/67 (!) 120/55 (!) 128/57  Pulse:  70 69 69  Resp:   13   Temp:   98.2 F (36.8 C)   TempSrc:   Oral   SpO2:   90%   Weight: 158 lb 14.4 oz (72.1 kg)     Height:        Intake/Output Summary (Last 24 hours) at 12/29/2017 0916 Last data filed at 12/29/2017 0600 Gross per 24 hour  Intake 701 ml  Output -  Net 701 ml   Filed Weights   12/28/17 1104 12/28/17 2042 12/29/17 0409  Weight: 155 lb (70.3 kg) 158 lb 11.7 oz (72 kg) 158 lb 14.4 oz (72.1 kg)    Telemetry    SR - Personally Reviewed  ECG    SR with incomplete RBBB - Personally Reviewed  Physical Exam   GEN: No acute distress.  But just does not feel well Neck: No JVD Cardiac: RRR, no murmurs, rubs, or gallops.  Respiratory: Clear to auscultation bilaterally. GI: Soft, nontender, non-distended  MS: No edema; No deformity. Neuro:  Nonfocal  Psych:  Normal affect   Labs    Chemistry Recent Labs  Lab 12/28/17 1110 12/29/17 0258  NA 134* 134*  K 3.7 3.2*  CL 99* 98*  CO2 25 27  GLUCOSE 293* 259*  BUN 11 9  CREATININE 0.71 0.85  CALCIUM 9.2 8.8*  GFRNONAA >60 >60  GFRAA >60 >60  ANIONGAP 10 9     Hematology Recent Labs  Lab 12/28/17 1110 12/29/17 0258  WBC 10.6* 13.0*  RBC 4.28 3.85*  HGB 13.9 12.3  HCT 41.2 36.5  MCV 96.3 94.8  MCH 32.5 31.9  MCHC 33.7 33.7  RDW 12.2 11.8  PLT 309 274    Cardiac Enzymes Recent Labs  Lab 12/28/17 2054 12/29/17 0258  TROPONINI <0.03 <0.03    Recent Labs  Lab 12/28/17 1114 12/28/17 1443  TROPIPOC 0.00 0.00     BNPNo results for input(s): BNP, PROBNP in the last 168 hours.   DDimer No results for input(s): DDIMER in the last 168 hours.   Radiology    Ct Angio Chest Aorta W And/or Wo Contrast  Result Date: 12/28/2017 CLINICAL DATA:  LEFT SIDE CHEST/FLANK PAIN, MID CHEST PAIN THAT RADIATES TO BACK WITH SHORTNESS OF [place]  1ST, HX CARDIAC STENT 2014 EXAM: CT ANGIOGRAPHY CHEST WITH CONTRAST TECHNIQUE: Multidetector CT imaging of the chest was performed using the standard protocol during bolus administration of intravenous contrast. Multiplanar CT image reconstructions and MIPs were obtained to evaluate the vascular anatomy. CONTRAST:  150mL ISOVUE-370 IOPAMIDOL (ISOVUE-370) INJECTION 76% COMPARISON:  05/02/2014 FINDINGS: Cardiovascular: Heart size normal. No effusion. Central pulmonary arteries are nondilated. Satisfactory opacification of pulmonary arteries noted, and there is no evidence of pulmonary emboli. Scattered coronary calcifications. Adequate contrast opacification of the thoracic aorta with no evidence of dissection, aneurysm, or stenosis. There is classic 3-vessel brachiocephalic arch anatomy without proximal stenosis. Nonocclusive partially calcified plaque at the origin of the left subclavian artery. Calcified plaque in the aortic arch and descending  thoracic segment. Mediastinum/Nodes: Slight enlargement of anterior mediastinal node now 11 mm short axis diameter, previously 10 mm on 05/02/2014. No other mediastinal or hilar adenopathy. Lungs/Pleura: No pneumothorax. No pleural effusion. Lungs are clear. Upper Abdomen: Prominent irregular geographic region of markedly decreased attenuation in the visualized liver. No capsular or contour distortion. There is no intraparenchymal distortion, with normal vessels traversing the region. Findings are most consistent with focal fatty infiltration. Findings were reviewed with Dr. Candise Che who concurs. No acute findings. Musculoskeletal: No chest wall abnormality. No acute or significant osseous findings. Review of the MIP images confirms the above findings. IMPRESSION: 1. Negative for acute PE or thoracic aortic dissection. 2. Coronary and Aortic Atherosclerosis (ICD10-170.0) 3. Slight continued interval enlargement of solitary anterior mediastinal lymph node since 05/02/2014, nonspecific. 4. Probable focal fatty infiltration in the liver. Electronically Signed   By: Lucrezia Europe M.D.   On: 12/28/2017 12:54    Cardiac Studies   Echo 12/28/17 Study Conclusions  - Left ventricle: The cavity size was normal. Wall thickness was   increased in a pattern of mild LVH. Systolic function was normal.   The estimated ejection fraction was in the range of 60% to 65%.   Wall motion was normal; there were no regional wall motion   abnormalities. Doppler parameters are consistent with abnormal   left ventricular relaxation (grade 1 diastolic dysfunction).   Doppler parameters are consistent with high ventricular filling   pressure. - Aortic valve: Mildly to moderately calcified annulus. [place]  Patient Profile     67 y.o. female with hx CAD -PCI to LAD in 2014, HTN, DB-2, GERD, chronic GI issues, and depression.  Now admitted with chest pain and back pain with SOB.  (transfer[facility] to [facility])  Assessment  & Plan    Unstable angina but neg troponin, on IV NTG and Heparin.  Plan for cardiac cath today.  Had severe pain 1 week ago, did not seek medical eval, NTG with mild relief.   --neg CTA for PE or aortic dissection --also on IV pepcid   CAD with prior PCI to LAD in 2014  Hyperkalemia replaced  HTN not well controlled.  NTG is helping. --BP 135/67 to 128/57 --lopressor 50 mg BID --losartan/hctz on hold for cath  -amlodipine 7.5 daily  DM-2 with elevated glucose.  On SSI here no outpt meds with hgb A1C of 9.4 --needs improved control.  Needs to follow with PCP  HLD with LDL 82, TG 191, HDL 40 and tchol 160--now on lipitor 80 (hx of statin intolerance) and outpt zetia may need PCSK-9  Carotid disease --mild 1-39% bil disease   OSA on CPAP   For questions or updates, please contact [facility] Please consult [email or web address] for  contact info under Cardiology/STEMI.      Signed, Cecilie Kicks, NP  12/29/2017, 9:16 AM

## 2017-12-29 NOTE — Progress Notes (Signed)
Saw pt and family and discussed results of cardiac cath.  All good news.  This may be GI causing the pain with her hx.  Will keep tonight now off NTG to eval BP and plan for d/c in AM.

## 2017-12-29 NOTE — Interval H&P Note (Signed)
Cath Lab Visit (complete for each Cath Lab visit)  Clinical Evaluation Leading to the Procedure:   ACS: Yes.   unstable angina  Non-ACS:     History and Physical Interval Note:  12/29/2017 10:24 AM  [name]  has presented today for surgery, with the diagnosis of cp  The various methods of treatment have been discussed with the patient and family. After consideration of risks, benefits and other options for treatment, the patient has consented to  Procedure(s): LEFT HEART CATH AND CORONARY ANGIOGRAPHY (N/A) as a surgical intervention .  The patient's history has been reviewed, patient examined, no change in status, stable for surgery.  I have reviewed the patient's chart and labs.  Questions were answered to the patient's satisfaction.     Kathlyn [place]

## 2017-12-29 NOTE — Progress Notes (Addendum)
At [place], while assessing patient, this RN noted that tubing for patient's IV heparin and IV nitroglycerin were intertwined above the modules. This RN untangled the tubing and noted that the module labeled "heparin" was infusing nitroglycerin, and the module labeled "nitroglycerin" was infusing heparin. Heparin had been infusing at 900 units/hour instead of 1050, and nitroglycerin was infusing at 35 mcg/min. Infusions were paused and pharmacy was called at this time. Pharmacist instructed this RN to restart the heparin infusion at 1050 per hour based on the last heparin level, and the next heparin level was re-timed. Nitroglycerin infusion was titrated down to 30 mcg/min at this time. New infusion rates were verified with Aldona Bar, charge RN. Patient & husband were made aware of error and were accepting. Safety zone portal completed by this RN.

## 2017-12-30 ENCOUNTER — Encounter (HOSPITAL_COMMUNITY): Payer: Self-pay | Admitting: Cardiovascular Disease

## 2017-12-30 ENCOUNTER — Ambulatory Visit: Payer: Medicare HMO | Admitting: Gastroenterology

## 2017-12-30 DIAGNOSIS — R0789 Other chest pain: Principal | ICD-10-CM

## 2017-12-30 LAB — GLUCOSE, CAPILLARY
GLUCOSE-CAPILLARY: 246 mg/dL — AB (ref 65–99)
Glucose-Capillary: 174 mg/dL — ABNORMAL HIGH (ref 65–99)

## 2017-12-30 MED ORDER — LOSARTAN POTASSIUM 50 MG PO TABS
100.0000 mg | ORAL_TABLET | Freq: Every day | ORAL | Status: DC
  Administered 2017-12-30: 100 mg via ORAL
  Filled 2017-12-30: qty 2

## 2017-12-30 MED ORDER — LOSARTAN POTASSIUM-HCTZ 100-25 MG PO TABS: 1.0000 | ORAL_TABLET | Freq: Every day | ORAL | Status: DC

## 2017-12-30 MED ORDER — HYDROCHLOROTHIAZIDE 25 MG PO TABS
25.0000 mg | ORAL_TABLET | Freq: Every day | ORAL | Status: DC
  Administered 2017-12-30: 25 mg via ORAL
  Filled 2017-12-30: qty 1

## 2017-12-30 MED ORDER — ATORVASTATIN CALCIUM 80 MG PO TABS: 80.0000 mg | ORAL_TABLET | Freq: Every day | ORAL | 3 refills | Status: DC

## 2017-12-30 MED ORDER — FAMOTIDINE 20 MG PO TABS: 20.0000 mg | ORAL_TABLET | Freq: Two times a day (BID) | ORAL | 0 refills | Status: DC

## 2017-12-30 MED ORDER — FAMOTIDINE 20 MG PO TABS: 20.0000 mg | ORAL_TABLET | Freq: Two times a day (BID) | ORAL | Status: DC

## 2017-12-30 MED FILL — Heparin Sod (Porcine)-NaCl IV Soln 1000 Unit/500ML-0.9%: INTRAVENOUS | Qty: 1000 | Status: AC

## 2017-12-30 NOTE — Progress Notes (Signed)
The patient has be given her instrucations along with a new medication list and what to take today. She has follow up appointments and prescriptions to pick up. She has been educated on radial site care. She is discharging with her husband via car.   Saddie Benders RN

## 2017-12-30 NOTE — Discharge Instructions (Signed)

## 2017-12-30 NOTE — Progress Notes (Signed)
Reviewed AVS discharge instructions with patient/caregiver. Patient/caregiver verbalizes understanding of instructions received. AVS and prescriptions received by patient/caregiver. If present, telemetry box removed and central cardiac monitoring department notified of discharge. Peripheral IV removed, site benign with tip intact.  

## 2017-12-30 NOTE — Progress Notes (Signed)
Progress Note  Patient Name: [name] Date of Encounter: 12/30/2017  Primary Cardiologist: Rozann Lesches, MD   Subjective   Less significant chest pressure today. Having acid reflux at night as well as dysphagia to solid foods - history of esophageal spasm and stricture in the past with EGD and dilation by Dr. [name]. Has been started on pepcid 20 mg BID here.  Inpatient Medications    Scheduled Meds: . amLODipine  7.5 mg Oral Daily  . aspirin EC  81 mg Oral Daily  . atorvastatin  80 mg Oral q1800  . DULoxetine  60 mg Oral Daily  . ezetimibe  10 mg Oral Daily  . famotidine  20 mg Oral BID  . insulin aspart  0-15 Units Subcutaneous TID WC  . insulin aspart  0-5 Units Subcutaneous QHS  . metoprolol tartrate  50 mg Oral BID  . montelukast  10 mg Oral QHS  . sodium chloride flush  3 mL Intravenous Q12H   Continuous Infusions: . sodium chloride    . nitroGLYCERIN Stopped (12/29/17 1055)   PRN Meds: sodium chloride, acetaminophen, albuterol, ALPRAZolam, HYDROcodone-acetaminophen, nitroGLYCERIN, ondansetron (ZOFRAN) IV, sodium chloride flush   Vital Signs    Vitals:   12/29/17 2024 12/30/17 0005 12/30/17 0445 12/30/17 0753  BP: (!) 143/63 (!) 135/52 133/75 (!) 154/83  Pulse: 74 (!) 58 67 72  Resp: 18 18 18 18   Temp: 98.2 F (36.8 C) 98.6 F (37 C) 97.8 F (36.6 C) 98.3 F (36.8 C)  TempSrc: Oral Oral Oral Oral  SpO2: 96% 96% 95% 95%  Weight:   159 lb 8 oz (72.3 kg)   Height:        Intake/Output Summary (Last 24 hours) at 12/30/2017 1109 Last data filed at 12/30/2017 0919 Gross per 24 hour  Intake 1224.75 ml  Output -  Net 1224.75 ml   Filed Weights   12/28/17 2042 12/29/17 0409 12/30/17 0445  Weight: 158 lb 11.7 oz (72 kg) 158 lb 14.4 oz (72.1 kg) 159 lb 8 oz (72.3 kg)    Telemetry    SR - Personally Reviewed  ECG    N/A  Physical Exam   GEN: No acute distress. Anxious. Neck: No JVD Cardiac: RRR, no murmurs, rubs, or gallops.  Respiratory:  Clear to auscultation bilaterally. GI: Soft, nontender, non-distended  MS: No edema; No deformity. Mild ecchymosis around cath site. Neuro:  Nonfocal  Psych: Normal affect   Labs    Chemistry Recent Labs  Lab 12/28/17 1110 12/29/17 0258  NA 134* 134*  K 3.7 3.2*  CL 99* 98*  CO2 25 27  GLUCOSE 293* 259*  BUN 11 9  CREATININE 0.71 0.85  CALCIUM 9.2 8.8*  GFRNONAA >60 >60  GFRAA >60 >60  ANIONGAP 10 9     Hematology Recent Labs  Lab 12/28/17 1110 12/29/17 0258  WBC 10.6* 13.0*  RBC 4.28 3.85*  HGB 13.9 12.3  HCT 41.2 36.5  MCV 96.3 94.8  MCH 32.5 31.9  MCHC 33.7 33.7  RDW 12.2 11.8  PLT 309 274    Cardiac Enzymes Recent Labs  Lab 12/28/17 2054 12/29/17 0258 12/29/17 1122  TROPONINI <0.03 <0.03 <0.03    Recent Labs  Lab 12/28/17 1114 12/28/17 1443  TROPIPOC 0.00 0.00     BNPNo results for input(s): BNP, PROBNP in the last 168 hours.   DDimer No results for input(s): DDIMER in the last 168 hours.   Radiology    Ct Angio Chest Aorta W  And/or Wo Contrast  Result Date: 12/28/2017 CLINICAL DATA:  LEFT SIDE CHEST/FLANK PAIN, MID CHEST PAIN THAT RADIATES TO BACK WITH SHORTNESS OF BREATH SINCE JUNE 1ST, HX CARDIAC STENT 2014 EXAM: CT ANGIOGRAPHY CHEST WITH CONTRAST TECHNIQUE: Multidetector CT imaging of the chest was performed using the standard protocol during bolus administration of intravenous contrast. Multiplanar CT image reconstructions and MIPs were obtained to evaluate the vascular anatomy. CONTRAST:  172mL ISOVUE-370 IOPAMIDOL (ISOVUE-370) INJECTION 76% COMPARISON:  05/02/2014 FINDINGS: Cardiovascular: Heart size normal. No effusion. Central pulmonary arteries are nondilated. Satisfactory opacification of pulmonary arteries noted, and there is no evidence of pulmonary emboli. Scattered coronary calcifications. Adequate contrast opacification of the thoracic aorta with no evidence of dissection, aneurysm, or stenosis. There is classic 3-vessel  brachiocephalic arch anatomy without proximal stenosis. Nonocclusive partially calcified plaque at the origin of the left subclavian artery. Calcified plaque in the aortic arch and descending thoracic segment. Mediastinum/Nodes: Slight enlargement of anterior mediastinal node now 11 mm short axis diameter, previously 10 mm on 05/02/2014. No other mediastinal or hilar adenopathy. Lungs/Pleura: No pneumothorax. No pleural effusion. Lungs are clear. Upper Abdomen: Prominent irregular geographic region of markedly decreased attenuation in the visualized liver. No capsular or contour distortion. There is no intraparenchymal distortion, with normal vessels traversing the region. Findings are most consistent with focal fatty infiltration. Findings were reviewed with Dr. Candise Che who concurs. No acute findings. Musculoskeletal: No chest wall abnormality. No acute or significant osseous findings. Review of the MIP images confirms the above findings. IMPRESSION: 1. Negative for acute PE or thoracic aortic dissection. 2. Coronary and Aortic Atherosclerosis (ICD10-170.0) 3. Slight continued interval enlargement of solitary anterior mediastinal lymph node since 05/02/2014, nonspecific. 4. Probable focal fatty infiltration in the liver. Electronically Signed   By: Lucrezia Europe M.D.   On: 12/28/2017 12:54    Cardiac Studies   Echo 12/28/17 Study Conclusions  - Left ventricle: The cavity size was normal. Wall thickness was   increased in a pattern of mild LVH. Systolic function was normal.   The estimated ejection fraction was in the range of 60% to 65%.   Wall motion was normal; there were no regional wall motion   abnormalities. Doppler parameters are consistent with abnormal   left ventricular relaxation (grade 1 diastolic dysfunction).   Doppler parameters are consistent with high ventricular filling   pressure. - Aortic valve: Mildly to moderately calcified annulus. [place]  Patient Profile     67 y.o. 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  Now admitted with chest pain and back pain with SOB.  (transferred from [facility] to [facility])  Assessment & Plan    Unstable angina - cath showed patent stent without new obstructive CAD. Suspect pain is due to GERD/spasm.  CAD with prior PCI to LAD in 2014 -stable stent on cath without restenosis or new significant obstruction.  HTN - bp improved. --BP 135/67 to 128/57 --lopressor 50 mg BID --losartan/hctz - restart -amlodipine 7.5 daily  DM-2 with elevated glucose.  On SSI here no outpt meds with hgb A1C of 9.4 --needs improved control.  Needs to follow with PCP  HLD with LDL 82, TG 191, HDL 40 and tchol 160--now on lipitor 80 (hx of statin intolerance) and outpt zetia may need PCSK-9  Carotid disease --mild 1-39% bil disease   OSA on CPAP   Ok for d/c home today. Plan follow-up with Dr. [name].  For questions or  updates, please contact Peter Please consult [email or web address] for contact info under Cardiology/STEMI.      Pixie Casino, MD, [facility], [place] Director of the Advanced Lipid Disorders &  Cardiovascular Risk Reduction Clinic Diplomate of the American Board of Clinical Lipidology Attending Cardiologist  Direct Dial: [phone]  Fax: [phone]  Website:  [email or web address]  Pixie Casino, MD  12/30/2017, 11:09 AM

## 2017-12-30 NOTE — Discharge Summary (Signed)
Discharge Summary    Patient ID: [name],  MRN: [id number], DOB/AGE: [birth year] 67 y.o.  Admit date: 12/28/2017 Discharge date: 12/30/2017  Primary Care Provider: Caryl Bis Primary Cardiologist: Rozann Lesches, MD  Discharge Diagnoses    Active Problems:   Unstable angina [facility])   Non-cardiac chest pain   Allergies Allergies  Allergen Reactions  . Erythromycin Base Diarrhea  . Azithromycin Other (See Comments)    Upset stomach   . Other     Grass, dust, green beans, corn, cabbage  . Pollen Extract   . Remeron [Mirtazapine] Other (See Comments)    Makes her too sleepy  . Tree Extract   . Zolpidem Tartrate Other (See Comments)    amnesia  . Sulfonamide Derivatives Rash    Mouth sores    Diagnostic Studies/Procedures    LEFT HEART CATH AND CORONARY ANGIOGRAPHY 12/29/17  Conclusion    The left ventricular systolic function is normal.  LV end diastolic pressure is mildly elevated.  The left ventricular ejection fraction is 55-65% by visual estimate.  Mid RCA lesion is 40% stenosed.  Prox Cx lesion is 30% stenosed.  Ost 1st Diag lesion is 40% stenosed.  Previously placed Mid LAD stent (unknown type) is widely patent.   1.  Widely patent mid LAD stent with no restenosis.  Mild to moderate nonobstructive disease affecting the RCA and left circumflex. 2.  Normal LV systolic function and mildly elevated left ventricular end-diastolic pressure.  Recommendations: I suspect noncardiac chest pain.  Continue medical therapy.   Diagnostic Diagram      _____________  Echocardiogram 12/28/17 Study Conclusions - Left ventricle: The cavity size was normal. Wall thickness was increased in a pattern of mild LVH. Systolic function was normal. The estimated ejection fraction was in the range of 60% to 65%. Wall motion was normal; there were no regional wall motion abnormalities. Doppler parameters are consistent with abnormal left ventricular relaxation  (grade 1 diastolic dysfunction). Doppler parameters are consistent with high ventricular filling pressure. - Aortic valve: Mildly to moderately calcified annulus. [place].    History of Present Illness     Ms. [name] is a 67 year old woman with a history of coronary artery disease with prior drug-eluting stent placement to the LAD in 2014.  She was last evaluated by Dr. Domenic Polite in the office on 04/23/2017 at which time she was doing well.  She underwent a low risk nuclear stress test on 06/05/2016 with no reversible defects to indicate ischemia, LVEF 71%.  She also has a history of type 2 diabetes mellitus, obstructive sleep apnea on CPAP, mild bilateral carotid artery disease, and hyperlipidemia with statin intolerance.  Coronary angiogram on 12/09/2012 showed no obstructive left main disease, 20% proximal left circumflex coronary artery stenosis, and mid RCA to 5% stenosis.  This was at the time she underwent struggling stent placement for a mid LAD 95% stenosis.  The proximal LAD had diffuse 20% stenosis.  She called our office on 11/01/2017 with complaints of fatigue and elevated blood pressure with headaches.  She started doing tai chi at that time.  A review of blood pressures at that time appear to be consistently elevated based on a phone note on 11/01/2017.  Carotid artery Dopplers were obtained which showed 1 to 39% mild bilateral disease.  It was also recommended that she follow-up with her PCP regarding blood pressure management.  She called our office on the morning of June 1 stating that  she was on  her way to the beach and started to have chest pain.  She apparently drank some Sprite and took 2 nitroglycerin and a baby aspirin at that time. She said since that time she had been having chest pain and it has been intermittent over the past week. On the day of presentation it radiated to her back.  She was transferred from [facility] to [facility] for further evaluation of chest pain in  the setting of known CAD.  Hospital Course     Consultants: none  She was started on IV heparin and nitroglycerin. Troponins remained negative X 5. She was taken to the cath lab on 12/29/17 and found to have patent stent without new obstructive CAD. Suspect pain is due to GERD/spasm. Her pain is less significant chest pressure today. Having acid reflux at night as well as dysphagia to solid foods - history of esophageal spasm and stricture in the past with EGD and dilation by Dr. [name]. Has been started on pepcid 20 mg BID here.  Blood pressures are well controlled. Continue lopressor 50 mg BID, amlodipine 7.5 mg daily, restart losartan/hctz. Glucose levels have been elevated. Hgb A1c 9.4. On Tresiba. Will need improved blood sugar control to decrease cardiac risk. Follow up with PCP.   HLD with LDL 82, TG 191, HDL 40 and tchol 160--now on lipitor 80 (hx of statin intolerance) and outpt zetia may need PCSK-9  Continue CPAP for OSA.  Patient has been seen by Dr. Debara Pickett today and deemed ready for discharge home. All follow up appointments have been scheduled. Discharge medications are listed below. _____________  Discharge Vitals Blood pressure (!) 154/83, pulse 72, temperature 98.3 F (36.8 C), temperature source Oral, resp. rate 18, height _0  (1.575 m), weight 159 lb 8 oz (72.3 kg), SpO2 95 %.  Filed Weights   12/28/17 2042 12/29/17 0409 12/30/17 0445  Weight: 158 lb 11.7 oz (72 kg) 158 lb 14.4 oz (72.1 kg) 159 lb 8 oz (72.3 kg)    Labs & Radiologic Studies    CBC Recent Labs    12/28/17 1110 12/29/17 0258  WBC 10.6* 13.0*  HGB 13.9 12.3  HCT 41.2 36.5  MCV 96.3 94.8  PLT 309 419   Basic Metabolic Panel Recent Labs    12/28/17 1110 12/29/17 0258  NA 134* 134*  K 3.7 3.2*  CL 99* 98*  CO2 25 27  GLUCOSE 293* 259*  BUN 11 9  CREATININE 0.71 0.85  CALCIUM 9.2 8.8*   Liver Function Tests No results for input(s): AST, ALT, ALKPHOS, BILITOT, PROT, ALBUMIN in the last  72 hours. No results for input(s): LIPASE, AMYLASE in the last 72 hours. Cardiac Enzymes Recent Labs    12/28/17 2054 12/29/17 0258 12/29/17 1122  TROPONINI <0.03 <0.03 <0.03   BNP Invalid input(s): POCBNP D-Dimer No results for input(s): DDIMER in the last 72 hours. Hemoglobin A1C Recent Labs    12/28/17 2203  HGBA1C 9.4*   Fasting Lipid Panel Recent Labs    12/29/17 0258  CHOL 160  HDL 40*  LDLCALC 82  TRIG 191*  CHOLHDL 4.0   Thyroid Function Tests No results for input(s): TSH, T4TOTAL, T3FREE, THYROIDAB in the last 72 hours.  Invalid input(s): FREET3 _____________  Ct Angio Chest Aorta W And/or Wo Contrast  Result Date: 12/28/2017 CLINICAL DATA:  LEFT SIDE CHEST/FLANK PAIN, MID CHEST PAIN THAT RADIATES TO BACK WITH SHORTNESS OF BREATH SINCE JUNE 1ST, HX CARDIAC STENT 2014 EXAM: CT ANGIOGRAPHY CHEST WITH CONTRAST  TECHNIQUE: Multidetector CT imaging of the chest was performed using the standard protocol during bolus administration of intravenous contrast. Multiplanar CT image reconstructions and MIPs were obtained to evaluate the vascular anatomy. CONTRAST:  183m ISOVUE-370 IOPAMIDOL (ISOVUE-370) INJECTION 76% COMPARISON:  05/02/2014 FINDINGS: Cardiovascular: Heart size normal. No effusion. Central pulmonary arteries are nondilated. Satisfactory opacification of pulmonary arteries noted, and there is no evidence of pulmonary emboli. Scattered coronary calcifications. Adequate contrast opacification of the thoracic aorta with no evidence of dissection, aneurysm, or stenosis. There is classic 3-vessel brachiocephalic arch anatomy without proximal stenosis. Nonocclusive partially calcified plaque at the origin of the left subclavian artery. Calcified plaque in the aortic arch and descending thoracic segment. Mediastinum/Nodes: Slight enlargement of anterior mediastinal node now 11 mm short axis diameter, previously 10 mm on 05/02/2014. No other mediastinal or hilar adenopathy.  Lungs/Pleura: No pneumothorax. No pleural effusion. Lungs are clear. Upper Abdomen: Prominent irregular geographic region of markedly decreased attenuation in the visualized liver. No capsular or contour distortion. There is no intraparenchymal distortion, with normal vessels traversing the region. Findings are most consistent with focal fatty infiltration. Findings were reviewed with Dr. GCandise Chewho concurs. No acute findings. Musculoskeletal: No chest wall abnormality. No acute or significant osseous findings. Review of the MIP images confirms the above findings. IMPRESSION: 1. Negative for acute PE or thoracic aortic dissection. 2. Coronary and Aortic Atherosclerosis (ICD10-170.0) 3. Slight continued interval enlargement of solitary anterior mediastinal lymph node since 05/02/2014, nonspecific. 4. Probable focal fatty infiltration in the liver. Electronically Signed   By: DLucrezia EuropeM.D.   On: 12/28/2017 12:54   Disposition   Pt is being discharged home today in good condition.  Follow-up Plans & Appointments    Follow-up Information    MSatira Sark MD Follow up on 02/11/2018.   Specialty:  Cardiology Why:  at 1000 AM Contact information: [place] [phone]        DCaryl Bis MD. Schedule an appointment as soon as possible for a visit.   Specialty:  Family Medicine Why:  call tomorrow for follow up in 1-2 weeks. for diabetes and BP Contact information: [place] [phone]        DDoran Stabler MD Follow up on 12/30/2017.   Specialty:  Gastroenterology Why:  at 3:30 PM Contact information: [address] [phone]           Discharge Medications   Allergies as of 12/30/2017      Reactions   Erythromycin Base Diarrhea   Azithromycin Other (See Comments)   Upset stomach   Other    Grass, dust, green beans, corn, cabbage   Pollen Extract    Remeron  [mirtazapine] Other (See Comments)   Makes her too sleepy   Tree Extract    Zolpidem Tartrate Other (See Comments)   amnesia   Sulfonamide Derivatives Rash   Mouth sores      Medication List    STOP taking these medications   predniSONE 50 MG tablet Commonly known as:  DELTASONE     TAKE these medications   acetaminophen 500 MG tablet Commonly known as:  TYLENOL Take 1,000 mg by mouth every 8 (eight) hours as needed for fever.   albuterol 108 (90 Base) MCG/ACT inhaler Commonly known as:  PROVENTIL HFA;VENTOLIN HFA Inhale 2 puffs into the lungs every 4 (four) hours as needed.  ALPRAZolam 1 MG tablet Commonly known as:  XANAX TAKE 1 TABLET BY MOUTH AT BEDTIME AS NEEDED FOR ANXIETY   amLODipine 5 MG tablet Commonly known as:  NORVASC TAKE 1 AND 1/2 TABLETS BY MOUTH EVERY DAY   aspirin EC 81 MG tablet Take 81 mg by mouth daily.   atorvastatin 80 MG tablet Commonly known as:  LIPITOR Take 1 tablet (80 mg total) by mouth daily at 6 PM.   doxycycline 100 MG tablet Commonly known as:  VIBRA-TABS Take 1 tablet by mouth 2 (two) times daily.   DULoxetine 60 MG capsule Commonly known as:  CYMBALTA Take 60 mg by mouth daily.   ezetimibe 10 MG tablet Commonly known as:  ZETIA TAKE 1 TABLET BY MOUTH EVERY DAY   famotidine 20 MG tablet Commonly known as:  PEPCID Take 1 tablet (20 mg total) by mouth 2 (two) times daily.   HYDROcodone-acetaminophen 5-325 MG tablet Commonly known as:  NORCO/VICODIN Take 1 tablet twice a day as needed   loratadine 10 MG tablet Commonly known as:  CLARITIN Take 10 mg by mouth daily as needed for allergies.   losartan-hydrochlorothiazide 100-25 MG tablet Commonly known as:  HYZAAR Take 1 tablet by mouth daily.   metoprolol tartrate 50 MG tablet Commonly known as:  LOPRESSOR TAKE 1 TABLET BY MOUTH TWICE DAILY   montelukast 10 MG tablet Commonly known as:  SINGULAIR Take 10 mg by mouth at bedtime.   MUCINEX MAXIMUM STRENGTH 1200  MG Tb12 Generic drug:  Guaifenesin Take 1 tablet by mouth 2 (two) times daily as needed.   nitroGLYCERIN 0.4 MG SL tablet Commonly known as:  NITROSTAT Place 0.4 mg under the tongue every 5 (five) minutes as needed for chest pain.   promethazine 25 MG tablet Commonly known as:  PHENERGAN Take 25 mg by mouth every 6 (six) hours as needed for nausea or vomiting.   traZODone 50 MG tablet Commonly known as:  DESYREL Take 200 mg by mouth at bedtime. 4 tabs hs   TRESIBA FLEXTOUCH 200 UNIT/ML Sopn Generic drug:  Insulin Degludec Inject 130 Units into the skin at bedtime. As directed        Acute coronary syndrome (MI, NSTEMI, STEMI, etc) this admission?:  No.    Outstanding Labs/Studies   None  Duration of Discharge Encounter   Greater than 30 minutes including physician time.  Signed, Luna Fuse, NP 12/30/2017, 11:34 AM

## 2017-12-30 NOTE — Consult Note (Signed)
            [facility] CM Primary Care Navigator  12/30/2017  [name] Dec 05, 1950 [id number]   Went toseepatient at the bedsideto identify possible discharge needs butshe was alreadydischargedhome per staff.   PerMD note, patientwas seen for evaluation of chest pain in the setting of CAD- carotid artery disease. (unstable angina, underwent left cardiac catheterization)  Primary care provider's office is listed as providing transition of care (TOC) follow-up.  Patient hasdischarge instruction to follow-up with primary care provider in 1- 2 weeks; gastroenterology on 12/30/17 and cardiology follow-up on 02/11/18.  Primary care provider's office called [facility]) to notify ofpatient's discharge, need for post hospital follow-up and transition of care (TOC). Notified ofpatient's health issues needingclosefollow-up(mainly DM with A1c of 9.4 and blood pressure management).  Made aware to refer patient to [facility] care management if deemed necessaryandappropriate foranyservices.   For additional questions please contact:  Edwena Felty A. Orvella Digiulio, BSN, RN-BC [facility] PRIMARY CARE Navigator Cell: [phone]

## 2018-01-06 DIAGNOSIS — E782 Mixed hyperlipidemia: Secondary | ICD-10-CM | POA: Diagnosis not present

## 2018-01-06 DIAGNOSIS — E1169 Type 2 diabetes mellitus with other specified complication: Secondary | ICD-10-CM | POA: Diagnosis not present

## 2018-01-11 ENCOUNTER — Encounter: Payer: Self-pay | Admitting: Physician Assistant

## 2018-01-11 ENCOUNTER — Encounter

## 2018-01-11 ENCOUNTER — Ambulatory Visit: Payer: Medicare HMO | Admitting: Physician Assistant

## 2018-01-11 VITALS — BP 120/60 | HR 82 | Ht 62.0 in | Wt 157.0 lb

## 2018-01-11 DIAGNOSIS — K219 Gastro-esophageal reflux disease without esophagitis: Secondary | ICD-10-CM | POA: Diagnosis not present

## 2018-01-11 DIAGNOSIS — R079 Chest pain, unspecified: Secondary | ICD-10-CM | POA: Diagnosis not present

## 2018-01-11 DIAGNOSIS — R1310 Dysphagia, unspecified: Secondary | ICD-10-CM

## 2018-01-11 MED ORDER — ESOMEPRAZOLE MAGNESIUM 40 MG PO CPDR: DELAYED_RELEASE_CAPSULE | ORAL | 4 refills | Status: DC

## 2018-01-11 NOTE — Patient Instructions (Signed)
We have sent the following medications to your pharmacy for you to pick up at your convenience: [place] 1. Nexium 40 mg We have provided you with  Strict antireflux information.  You have been scheduled for an endoscopy. Please follow written instructions given to you at your visit today. If you use inhalers (even only as needed), please bring them with you on the day of your procedure. Your physician has requested that you go to www.startemmi.com and enter the access code given to you at your visit today. This web site gives a general overview about your procedure. However, you should still follow specific instructions given to you by our office regarding your preparation for the procedure.  If you are age 69 or older, your body mass index should be between 23-30. Your Body mass index is 28.72 kg/m. If this is out of the aforementioned range listed, please consider follow up with your Primary Care Provider.

## 2018-01-11 NOTE — Progress Notes (Signed)
Subjective:    Patient ID: [name], female    DOB: Aug 13, 1950, 67 y.o.   MRN: [id number]  HPI [name] is a 67 year old white female, known to Dr. [name] who was referred back today by Cecilie Kicks, NP/cardiology for evaluation of  what is felt to be noncardiac chest pain. Patient just underwent a cardiac cath on 12/29/2017 which showed a widely patent LAD stent without restenosis mild to moderate nonobstructive disease in the RCA and left circumflex, normal LV function. Patient has history of hypertension, coronary artery disease, adult onset diabetes mellitus, IBS, GERD with prior stricture, she status post cholecystectomy, has history of fibromyalgia and history of colon polyps. She was last seen in our office in 2018, she had undergone EGD with Dr. [name] in April 2017 with finding of a small hiatal hernia and a widely patent Schatzki's ring.  She also had manometry in April 2017 which was normal.  Last colonoscopy May 2016 per Dr. [name], there were no polyps, she was found to have severe diverticulosis in the sigmoid colon and large internal hemorrhoids. Patient has been on twice daily Pepcid since she was seen by cardiology, prior to that had not been on PPI therapy for a while.  She believes that her primary care physician had stopped it.  She reports a "terrible" episode of chest pain that radiated through to her back on December 18, 2017.  She was sitting in the car on her way to the beach with her husband when this occurred.  She says she felt short of breath with it and described it as a severe pressure type pain.  She wound up taking 2 nitroglycerin and baby aspirin and eventually the pain eased off.  She does not associate this pain with eating. She says however the rest of that week she had pain off and on in her chest and really did not do anything while she was on vacation. She has not had any further severe chest pain.  She is complaining of a lot of reflux type symptoms especially at  night that wake her up.  She brings up bitter bilious tasting material.  She also has some heartburn during the day.   She does have intermittent dysphasia to pills and foods says she feels tight in her chest frequently.  Patient brought copies of labs with her diet today from her PCP done on 12/27/2017 all within normal limits other than ALT of 51.   Review of Systems Pertinent positive and negative review of systems were noted in the above HPI section.  All other review of systems was otherwise negative.  Outpatient Encounter Medications as of 01/11/2018  Medication Sig  . albuterol (PROVENTIL HFA;VENTOLIN HFA) 108 (90 Base) MCG/ACT inhaler Inhale 2 puffs into the lungs every 4 (four) hours as needed.  . ALPRAZolam (XANAX) 1 MG tablet TAKE 1 TABLET BY MOUTH AT BEDTIME AS NEEDED FOR ANXIETY  . amLODipine (NORVASC) 5 MG tablet TAKE 1 AND 1/2 TABLETS BY MOUTH EVERY DAY  . aspirin EC 81 MG tablet Take 81 mg by mouth daily.  Marland Kitchen atorvastatin (LIPITOR) 80 MG tablet Take 1 tablet (80 mg total) by mouth daily at 6 PM.  . DULoxetine (CYMBALTA) 60 MG capsule Take 60 mg by mouth daily.   Marland Kitchen ezetimibe (ZETIA) 10 MG tablet TAKE 1 TABLET BY MOUTH EVERY DAY  . famotidine (PEPCID) 20 MG tablet Take 1 tablet (20 mg total) by mouth 2 (two) times daily.  . Guaifenesin ([place]  STRENGTH) 1200 MG TB12 Take 1 tablet by mouth 2 (two) times daily as needed.  Marland Kitchen HYDROcodone-acetaminophen (NORCO/VICODIN) 5-325 MG tablet Take 1 tablet twice a day as needed  . loratadine (CLARITIN) 10 MG tablet Take 10 mg by mouth daily as needed for allergies.  Marland Kitchen losartan-hydrochlorothiazide (HYZAAR) 100-25 MG tablet Take 1 tablet by mouth daily.   . metoprolol tartrate (LOPRESSOR) 50 MG tablet TAKE 1 TABLET BY MOUTH TWICE DAILY  . montelukast (SINGULAIR) 10 MG tablet Take 10 mg by mouth at bedtime.   . nitroGLYCERIN (NITROSTAT) 0.4 MG SL tablet Place 0.4 mg under the tongue every 5 (five) minutes as needed for chest pain.  .  promethazine (PHENERGAN) 25 MG tablet Take 25 mg by mouth every 6 (six) hours as needed for nausea or vomiting.  . traZODone (DESYREL) 50 MG tablet Take 200 mg by mouth at bedtime. 4 tabs hs  . TRESIBA FLEXTOUCH 200 UNIT/ML SOPN Inject 130 Units into the skin at bedtime. As directed  . esomeprazole (NEXIUM) 40 MG capsule Take 1 cap by mouth twice daily, with breakfast and dinner.  . [DISCONTINUED] acetaminophen (TYLENOL) 500 MG tablet Take 1,000 mg by mouth every 8 (eight) hours as needed for fever.  . [DISCONTINUED] doxycycline (VIBRA-TABS) 100 MG tablet Take 1 tablet by mouth 2 (two) times daily.   No facility-administered encounter medications on file as of 01/11/2018.    Allergies  Allergen Reactions  . Erythromycin Base Diarrhea  . Azithromycin Other (See Comments)    Upset stomach   . Other     Grass, dust, green beans, corn, cabbage  . Pollen Extract   . Remeron [Mirtazapine] Other (See Comments)    Makes her too sleepy  . Tree Extract   . Zolpidem Tartrate Other (See Comments)    amnesia  . Sulfonamide Derivatives Rash    Mouth sores   Patient Active Problem List   Diagnosis Date Noted  . Non-cardiac chest pain   . Unstable angina ([place]) 12/28/2017  . Dysphagia   . IBS (irritable bowel syndrome) 10/18/2015  . Diarrhea 10/11/2014  . Esophageal stricture 10/11/2014  . History of colonic polyps 10/11/2014  . Rectal bleeding 10/11/2014  . Type 2 diabetes mellitus ([place]) 12/27/2012  . Coronary atherosclerosis of native coronary artery 12/10/2012  . Mixed hyperlipidemia 05/24/2007  . Obstructive sleep apnea 05/24/2007  . Essential hypertension, benign 05/24/2007   Social History   Socioeconomic History  . Marital status: Married    Spouse name: Konrad Dolores  . Number of children: 2  . Years of education: Not on file  . Highest education level: Not on file  Occupational History  . Occupation: Retired  [employer]  Social Needs  . Financial resource strain: Not on file  . Food  insecurity:    Worry: Not on file    Inability: Not on file  . Transportation needs:    Medical: Not on file    Non-medical: Not on file  Tobacco Use  . Smoking status: Former Smoker    Packs/day: 0.80    Years: 15.00    Pack years: 12.00    Types: Cigarettes    Start date: 07/20/1976    Last attempt to quit: 07/20/1993    Years since quitting: 24.4  . Smokeless tobacco: Never Used  Substance and Sexual Activity  . Alcohol use: No    Alcohol/week: 0.0 oz  . Drug use: No  . Sexual activity: Yes  Lifestyle  . Physical activity:    Days  per week: Not on file    Minutes per session: Not on file  . Stress: Not on file  Relationships  . Social connections:    Talks on phone: Not on file    Gets together: Not on file    Attends religious service: Not on file    Active member of club or organization: Not on file    Attends meetings of clubs or organizations: Not on file    Relationship status: Not on file  . Intimate partner violence:    Fear of current or ex partner: Not on file    Emotionally abused: Not on file    Physically abused: Not on file    Forced sexual activity: Not on file  Other Topics Concern  . Not on file  Social History Narrative  . Not on file    Ms. Weyandt family history includes Cirrhosis in her father; Colon polyps in her sister; Esophageal cancer in her sister; Hypertension in her father; Prostate cancer in her brother; Stroke in her mother.      Objective:    Vitals:   01/11/18 0926  BP: 120/60  Pulse: 82    Physical Exam; well-developed older white female in no acute distress, pleasant blood pressure 120/60 pulse 82, height 5 foot 2, weight 157, BMI 28.7.  HEENT; nontraumatic normocephalic EOMI PERRLA sclera anicteric, Oropharynx clear, Cardiovascular ;regular rate and rhythm with S1-S2 no murmur rub or gallop, Pulmonary ;clear bilaterally, Abdomen ;soft, she has some mild generalized tenderness to light touch no guarding or rebound no palpable  mass or hepatosplenomegaly, bowel sounds present , Rectal; exam not done, Extremities; no clubbing cyanosis or edema skin warm and dry, Neuro psych; alert and oriented, grossly nonfocal mood and affect appropriate       Assessment & Plan:   #43 67 year old white female with chest pain felt to be noncardiac in patient with known coronary artery disease status post LAD stent. Patient had cardiac cath on 12/29/2017 with widely patent LAD stent with no restenosis and mild to moderate nonobstructive disease RCA and left circumflex. Patient had presented after a bad episode of chest pain radiating through to the back on December 18, 2017 which eventually resolved after 2 nitroglycerin and a baby aspirin.  Patient had recurrent milder episodes of pain over the next few days.   #2 history of chronic GERD-she did not been on any medication for GERD over the past several months..  She has had increase in nighttime symptoms particularly, and intermittent dysphasia. Certainly her chest pain may have been related to poorly controlled GERD plus minus esophageal spasm however prior manometry was normal.  #3 dysphasia, intermittent-prior Savary dilation 2012 done for dysphasia but no stricture.  Widely patent Schatzki's ring noted at last EGD 2017  #4 adult onset diabetes mellitus #5.  IBS #6.  Diverticulosis #7.  Status post cholecystectomy #8.  Fibromyalgia #9 colon cancer surveillance-up-to-date with colonoscopy last done May 2016 no polyps noted indicated for 10-year interval follow-up  Plan; strict antireflux regimen reviewed with patient, n.p.o. for 2 to 3 hours prior to bedtime was recommended and elevation of her back at least 45 degrees while sleeping. Start Nexium 40 mg p.o. twice daily AC breakfast and before meals dinner. Patient will be scheduled for upper endoscopy with possible esophageal dilation,/or empiric dilation with Dr. [name].  Procedure was discussed in detail with the patient including  indications risks and benefits and she is agreeable to proceed.  Rayane Gallardo S Isiaah Cuervo PA-C 01/11/2018  Cc: Caryl Bis, MD

## 2018-01-18 NOTE — Progress Notes (Signed)
Thank you for sending this case to me. I have reviewed the entire note, and the outlined plan seems appropriate.  Sounds most likely to be suboptimally controlled GERD with esophageal spasm.  Wilfrid Lund, MD

## 2018-01-25 ENCOUNTER — Telehealth: Payer: Self-pay

## 2018-01-25 NOTE — Telephone Encounter (Signed)
PA for esomeprazole 40 BID has ben approved.   Janelie Scavone (Key: [id number]) - Esomeprazole Magnesium 40MG  dr capsules Status: PA Response - Approved Created: July 3rd, 2019 [phone] Sent: July 8th, 2019

## 2018-01-29 DIAGNOSIS — I2510 Atherosclerotic heart disease of native coronary artery without angina pectoris: Secondary | ICD-10-CM | POA: Diagnosis not present

## 2018-01-29 DIAGNOSIS — I700 Atherosclerosis of aorta: Secondary | ICD-10-CM | POA: Diagnosis not present

## 2018-01-29 DIAGNOSIS — E782 Mixed hyperlipidemia: Secondary | ICD-10-CM | POA: Diagnosis not present

## 2018-01-29 DIAGNOSIS — I10 Essential (primary) hypertension: Secondary | ICD-10-CM | POA: Diagnosis not present

## 2018-01-29 DIAGNOSIS — E1169 Type 2 diabetes mellitus with other specified complication: Secondary | ICD-10-CM | POA: Diagnosis not present

## 2018-01-29 DIAGNOSIS — Z6828 Body mass index (BMI) 28.0-28.9, adult: Secondary | ICD-10-CM | POA: Diagnosis not present

## 2018-02-02 ENCOUNTER — Telehealth: Payer: Self-pay | Admitting: Gastroenterology

## 2018-02-02 NOTE — Telephone Encounter (Signed)
Routed to DOD, patient of Dr. [name]. Please advise.

## 2018-02-02 NOTE — Telephone Encounter (Signed)
Pt has egd scheduled on 02/08/18 with Dr. [name]. She has a tooth infection and started to take amoxiciline 500 mg 4 times a day today. She will finish treatment on 02/09/18. She wants to know if she should r/s procedure.

## 2018-02-03 NOTE — Telephone Encounter (Signed)
OK for EGD as planned

## 2018-02-03 NOTE — Telephone Encounter (Signed)
Patient advised its okay to continue as planned for EGD. Instructed her to take her blood pressure medication in the morning along with the amoxicillin QID and to take next dose at 11:30. Her procedure is not until 4:00.  Patient also asked about taking her insulin. These instructions were not part of her written instructions. Verbally went over for her to take her Tyler Aas (long acting insulin) 1/2 dose the evening prior and none the morning of procedure. She does monitor her blood sugars and understands to continue on clear liquids until noon and can adjust with either water or more sugary liquid. She will call if she has further questions or concerns.

## 2018-02-08 ENCOUNTER — Encounter: Payer: Self-pay | Admitting: Gastroenterology

## 2018-02-08 ENCOUNTER — Ambulatory Visit (AMBULATORY_SURGERY_CENTER): Payer: Medicare HMO | Admitting: Gastroenterology

## 2018-02-08 VITALS — BP 114/60 | HR 65 | Temp 98.6°F | Resp 22 | Ht 62.0 in | Wt 157.0 lb

## 2018-02-08 DIAGNOSIS — R1310 Dysphagia, unspecified: Secondary | ICD-10-CM

## 2018-02-08 DIAGNOSIS — K219 Gastro-esophageal reflux disease without esophagitis: Secondary | ICD-10-CM

## 2018-02-08 DIAGNOSIS — R1319 Other dysphagia: Secondary | ICD-10-CM

## 2018-02-08 MED ORDER — SODIUM CHLORIDE 0.9 % IV SOLN: 500.0000 mL | Freq: Once | INTRAVENOUS | Status: DC

## 2018-02-08 NOTE — Patient Instructions (Signed)
Handouts given on GERD protocol, stricture and dilation diet.   YOU HAD AN ENDOSCOPIC PROCEDURE TODAY AT [facility]:   Refer to the procedure report that was given to you for any specific questions about what was found during the examination.  If the procedure report does not answer your questions, please call your gastroenterologist to clarify.  If you requested that your care partner not be given the details of your procedure findings, then the procedure report has been included in a sealed envelope for you to review at your convenience later.  YOU SHOULD EXPECT: Some feelings of bloating in the abdomen. Passage of more gas than usual.  Walking can help get rid of the air that was put into your GI tract during the procedure and reduce the bloating. If you had a lower endoscopy (such as a colonoscopy or flexible sigmoidoscopy) you may notice spotting of blood in your stool or on the toilet paper. If you underwent a bowel prep for your procedure, you may not have a normal bowel movement for a few days.  Please Note:  You might notice some irritation and congestion in your nose or some drainage.  This is from the oxygen used during your procedure.  There is no need for concern and it should clear up in a day or so.  SYMPTOMS TO REPORT IMMEDIATELY:    Following upper endoscopy (EGD)  Vomiting of blood or coffee ground material  New chest pain or pain under the shoulder blades  Painful or persistently difficult swallowing  New shortness of breath  Fever of 100F or higher  Black, tarry-looking stools  For urgent or emergent issues, a gastroenterologist can be reached at any hour by calling [phone].   DIET:  We do recommend a small meal at first, but then you may proceed to your regular diet.  Drink plenty of fluids but you should avoid alcoholic beverages for 24 hours.  ACTIVITY:  You should plan to take it easy for the rest of today and you should NOT DRIVE or use  heavy machinery until tomorrow (because of the sedation medicines used during the test).    FOLLOW UP: Our staff will call the number listed on your records the next business day following your procedure to check on you and address any questions or concerns that you may have regarding the information given to you following your procedure. If we do not reach you, we will leave a message.  However, if you are feeling well and you are not experiencing any problems, there is no need to return our call.  We will assume that you have returned to your regular daily activities without incident.  If any biopsies were taken you will be contacted by phone or by letter within the next 1-3 weeks.  Please call us at [phone] if you have not heard about the biopsies in 3 weeks.    SIGNATURES/CONFIDENTIALITY: You and/or your care partner have signed paperwork which will be entered into your electronic medical record.  These signatures attest to the fact that that the information above on your After Visit Summary has been reviewed and is understood.  Full responsibility of the confidentiality of this discharge information lies with you and/or your care-partner.

## 2018-02-08 NOTE — Op Note (Signed)
[facility] Patient Name: [name] Procedure Date: 02/08/2018 3:13 PM MRN: [id number] Endoscopist: Mallie Mussel L. [name] , MD Age: 67 Referring MD:  Date of Birth: Dec 04, 1950 Gender: Female Account #: [id number] Procedure:                Upper GI endoscopy Indications:              Dysphagia, Esophageal reflux symptoms that persist                            despite appropriate therapy (including nocturnal                            regurgitation and non-cardiac chest pain) Medicines:                Monitored Anesthesia Care Procedure:                Pre-Anesthesia Assessment:                           - Prior to the procedure, a History and Physical                            was performed, and patient medications and                            allergies were reviewed. The patient's tolerance of                            previous anesthesia was also reviewed. The risks                            and benefits of the procedure and the sedation                            options and risks were discussed with the patient.                            All questions were answered, and informed consent                            was obtained. Anticoagulants: The patient has taken                            aspirin. It was decided not to withhold this                            medication prior to the procedure. ASA Grade                            Assessment: III - A patient with severe systemic                            disease. After reviewing the risks and benefits,  the patient was deemed in satisfactory condition to                            undergo the procedure.                           After obtaining informed consent, the endoscope was                            passed under direct vision. Throughout the                            procedure, the patient's blood pressure, pulse, and                            oxygen saturations were monitored  continuously. The                            Endoscope was introduced through the mouth, and                            advanced to the second part of duodenum. The upper                            GI endoscopy was accomplished without difficulty.                            The patient tolerated the procedure well. Scope In: Scope Out: Findings:                 A widely patent Schatzki ring was found at the                            gastroesophageal junction. This was biopsied with a                            cold forceps for disruption of the ring (tissue                            discarded).                           A small hiatal hernia was present.                           There is no endoscopic evidence of esophagitis in                            the entire esophagus.                           The stomach was normal.                           The cardia and gastric fundus were normal on  retroflexion, and it distended well with air.                           The examined duodenum was normal. Complications:            No immediate complications. Estimated Blood Loss:     Estimated blood loss was minimal. Impression:               - Widely patent Schatzki ring. Biopsied.                           - Small hiatal hernia.                           - Normal stomach.                           - Normal examined duodenum. Recommendation:           - Patient has a contact number available for                            emergencies. The signs and symptoms of potential                            delayed complications were discussed with the                            patient. Return to normal activities tomorrow.                            Written discharge instructions were provided to the                            patient.                           - Resume previous diet.                           - Continue present medications.                           -  Perform ambulatory pH and impedance monitoring                            (off PPI 5 days prior) at appointment to be                            scheduled. Adrine Hayworth L. Phyllis Carrow, MD 02/08/2018 3:43:04 PM This report has been signed electronically.

## 2018-02-08 NOTE — Progress Notes (Signed)
A and O x3. Report to RN. Tolerated MAC anesthesia well.Teeth unchanged after procedure.

## 2018-02-09 ENCOUNTER — Telehealth: Payer: Self-pay | Admitting: Gastroenterology

## 2018-02-09 ENCOUNTER — Telehealth: Payer: Self-pay

## 2018-02-09 ENCOUNTER — Other Ambulatory Visit: Payer: Self-pay

## 2018-02-09 DIAGNOSIS — K219 Gastro-esophageal reflux disease without esophagitis: Secondary | ICD-10-CM

## 2018-02-09 NOTE — Telephone Encounter (Signed)
Please schedule 24 hr esophageal pH and impedance study for this patient. I would like her off PPI for 5 days prior.  She can use tums or maalox as needed for heartburn during that time including the study itself.  She is expecting to hear from [place], and would like to do it later in August or early Sept because of some upcoming oral surgery. Dx: GERD without esophagitis

## 2018-02-09 NOTE — Telephone Encounter (Signed)
  Follow up Call-  Call back number 02/08/2018 11/15/2015  Post procedure Call Back phone  # [phone]  Permission to leave phone message Yes Yes  Some recent data might be hidden     Patient questions:  Do you have a fever, pain , or abdominal swelling? No. Pain Score  0 *  Have you tolerated food without any problems? Yes.    Have you been able to return to your normal activities? Yes.    Do you have any questions about your discharge instructions: Diet   No. Medications  No. Follow up visit  No.  Do you have questions or concerns about your Care? No.  Actions: * If pain score is 4 or above: No action needed, pain <4.

## 2018-02-09 NOTE — Progress Notes (Signed)
Cardiology Office Note  Date: 02/11/2018   ID: [name], DOB Aug 22, 1950, MRN [id number]  PCP: Caryl Bis, MD  Primary Cardiologist: Rozann Lesches, MD   Chief Complaint  Patient presents with  . Coronary Artery Disease    History of Present Illness: [name] is a 67 y.o. female last seen in October 2018.  I reviewed interval records.  She was hospitalized in June of this year with chest pain and concern for unstable angina.  Troponin I levels remained negative and she underwent cardiac catheterization on June 12 which revealed widely patent mid LAD stent with mild to moderate nonobstructive disease in the RCA and circumflex.  Symptoms were felt to be most likely related to esophageal spasm and GERD.  She presents today for a follow-up visit.  States that she has been feeling better.  She continues with GI follow-up, had a recent ECG and known esophageal stricture.  We went over her medications today.  Also reviewed her recent lipid panel with LDL in the 80s.  She has had previous statin intolerance.  We discussed trying to keep her on Lipitor but reduce the dose to 10 mg daily, otherwise stop Zetia.  She plans to have 5 teeth extracted, no cardiac contraindication to proceed.  Past Medical History:  Diagnosis Date  . Anxiety   . Arthritis   . Asthma   . Barrett esophagus   . Chronic bronchitis ([place])   . Chronic diarrhea   . Chronic lower back pain   . Coronary atherosclerosis of native coronary artery    DES to LAD May 2014  . Diverticulosis   . Essential hypertension, benign   . Fatty liver   . Fibromyalgia   . GERD (gastroesophageal reflux disease)   . H/O hiatal hernia   . IBS (irritable bowel syndrome)   . Major depression in partial remission ([place])   . Migraines   . Mixed hyperlipidemia   . OSA on CPAP   . Pancreatitis 2008  . Pneumonia 1990's  . Sleep apnea   . Type 2 diabetes mellitus ([place])     Past Surgical History:  Procedure Laterality  Date  . ABDOMINAL HYSTERECTOMY  ?1987  . APPENDECTOMY  ?1987  . BUNIONECTOMY Bilateral 1972  . CARPAL TUNNEL RELEASE Bilateral ?1990  . CATARACT EXTRACTION W/ INTRAOCULAR LENS  IMPLANT, BILATERAL Bilateral ?2005  . CESAREAN SECTION  1985  . CHOLECYSTECTOMY  1990'S  . COLONOSCOPY  10/13   Dr. Britta Mccreedy - tubular adenomas and diverticulosis  . DILATION AND CURETTAGE OF UTERUS  1970's   "probably 3" (12/09/2012)  . ERCP  2008  . ESOPHAGEAL DILATION  ` 2012  . ESOPHAGEAL MANOMETRY N/A 10/21/2015   Procedure: ESOPHAGEAL MANOMETRY (EM);  Surgeon: Doran Stabler, MD;  Location: [facility];  Service: Gastroenterology;  Laterality: N/A;  . HUMERUS FRACTURE SURGERY Right 07/19/1969   "horst threw me" (12/09/2012)  . LEFT HEART CATH AND CORONARY ANGIOGRAPHY N/A 12/29/2017   Procedure: LEFT HEART CATH AND CORONARY ANGIOGRAPHY;  Surgeon: Wellington Hampshire, MD;  Location: [facility];  Service: Cardiovascular;  Laterality: N/A;  . NASAL SEPTUM SURGERY  1970's  . PERCUTANEOUS CORONARY STENT INTERVENTION (PCI-S) N/A 12/09/2012   Procedure: PERCUTANEOUS CORONARY STENT INTERVENTION (PCI-S);  Surgeon: Burnell Blanks, MD;  Location: [facility];  Service: Cardiovascular;  Laterality: N/A;  . TOE FUSION Right 10/11   First MTP joint  . TOE SURGERY Right 1990's   "toe next  to big toe:  dr said I had tumor; cut bone & stuff; another OR dr stretched tendons, etc" (12/09/2012)  . TUBAL LIGATION  1985    Current Outpatient Medications  Medication Sig Dispense Refill  . albuterol (PROVENTIL HFA;VENTOLIN HFA) 108 (90 Base) MCG/ACT inhaler Inhale 2 puffs into the lungs every 4 (four) hours as needed.  3  . ALPRAZolam (XANAX) 1 MG tablet TAKE 1 TABLET BY MOUTH AT BEDTIME AS NEEDED FOR ANXIETY  1  . amLODipine (NORVASC) 5 MG tablet TAKE 1 AND 1/2 TABLETS BY MOUTH EVERY DAY 135 tablet 1  . aspirin EC 81 MG tablet Take 81 mg by mouth daily.    . DULoxetine (CYMBALTA) 60 MG capsule Take 60 mg by mouth  daily.     Marland Kitchen ezetimibe (ZETIA) 10 MG tablet TAKE 1 TABLET BY MOUTH EVERY DAY 90 tablet 3  . famotidine (PEPCID) 20 MG tablet Take 1 tablet (20 mg total) by mouth 2 (two) times daily. 60 tablet 0  . Guaifenesin (MUCINEX MAXIMUM STRENGTH) 1200 MG TB12 Take 1 tablet by mouth 2 (two) times daily as needed.    Marland Kitchen HYDROcodone-acetaminophen (NORCO/VICODIN) 5-325 MG tablet Take 1 tablet twice a day as needed  0  . loratadine (CLARITIN) 10 MG tablet Take 10 mg by mouth daily as needed for allergies.    Marland Kitchen losartan-hydrochlorothiazide (HYZAAR) 100-25 MG tablet Take 1 tablet by mouth daily.     . metoprolol tartrate (LOPRESSOR) 50 MG tablet TAKE 1 TABLET BY MOUTH TWICE DAILY 180 tablet 0  . montelukast (SINGULAIR) 10 MG tablet Take 10 mg by mouth at bedtime.     . nitroGLYCERIN (NITROSTAT) 0.4 MG SL tablet Place 0.4 mg under the tongue every 5 (five) minutes as needed for chest pain.    . promethazine (PHENERGAN) 25 MG tablet Take 25 mg by mouth every 6 (six) hours as needed for nausea or vomiting.    . traZODone (DESYREL) 50 MG tablet Take 200 mg by mouth at bedtime. 4 tabs hs    . TRESIBA FLEXTOUCH 200 UNIT/ML SOPN Inject 130 Units into the skin at bedtime. As directed    . atorvastatin (LIPITOR) 10 MG tablet Take 1 tablet (10 mg total) by mouth daily. 90 tablet 3   No current facility-administered medications for this visit.    Allergies:  Erythromycin base; Azithromycin; Other; Pollen extract; Remeron [mirtazapine]; Tree extract; Zolpidem tartrate; and Sulfonamide derivatives   Social History: The patient  reports that she quit smoking about 24 years ago. Her smoking use included cigarettes. She started smoking about 41 years ago. She has a 12.00 pack-year smoking history. She has never used smokeless tobacco. She reports that she does not drink alcohol or use drugs.   ROS:  Please see the history of present illness. Otherwise, complete review of systems is positive for reflux.  All other systems are  reviewed and negative.   Physical Exam: VS:  BP (!) 158/72   Pulse 63   Ht 5\' 2"  (1.575 m)   Wt 160 lb (72.6 kg)   SpO2 94%   BMI 29.26 kg/m , BMI Body mass index is 29.26 kg/m.  Wt Readings from Last 3 Encounters:  02/11/18 160 lb (72.6 kg)  02/08/18 157 lb (71.2 kg)  01/11/18 157 lb (71.2 kg)    General: Patient appears comfortable at rest. HEENT: Conjunctiva and lids normal, oropharynx clear. Neck: Supple, no elevated JVP or carotid bruits, no thyromegaly. Lungs: Clear to auscultation, nonlabored breathing at rest.  Cardiac: Regular rate and rhythm, no S3 or significant systolic murmur, no pericardial rub. Abdomen: Soft, nontender, bowel sounds present. Extremities: No pitting edema, distal pulses 2+. Skin: Warm and dry. Musculoskeletal: No kyphosis. Neuropsychiatric: Alert and oriented x3, affect grossly appropriate.  ECG: I personally reviewed the tracing from 12/28/2017 which showed sinus rhythm with left atrial enlargement, R' in lead V1 V2.  Recent Labwork: 12/29/2017: BUN 9; Creatinine, Ser 0.85; Hemoglobin 12.3; Platelets 274; Potassium 3.2; Sodium 134     Component Value Date/Time   CHOL 160 12/29/2017 0258   TRIG 191 (H) 12/29/2017 0258   HDL 40 (L) 12/29/2017 0258   CHOLHDL 4.0 12/29/2017 0258   VLDL 38 12/29/2017 0258   LDLCALC 82 12/29/2017 0258    Other Studies Reviewed Today:  Cardiac catheterization 12/29/2017:  The left ventricular systolic function is normal.  LV end diastolic pressure is mildly elevated.  The left ventricular ejection fraction is 55-65% by visual estimate.  Mid RCA lesion is 40% stenosed.  Prox Cx lesion is 30% stenosed.  Ost 1st Diag lesion is 40% stenosed.  Previously placed Mid LAD stent (unknown type) is widely patent.   1.  Widely patent mid LAD stent with no restenosis.  Mild to moderate nonobstructive disease affecting the RCA and left circumflex. 2.  Normal LV systolic function and mildly elevated left ventricular  end-diastolic pressure.  Recommendations: I suspect noncardiac chest pain.  Continue medical therapy.  Echocardiogram 12/28/2017: Study Conclusions  - Left ventricle: The cavity size was normal. Wall thickness was   increased in a pattern of mild LVH. Systolic function was normal.   The estimated ejection fraction was in the range of 60% to 65%.   Wall motion was normal; there were no regional wall motion   abnormalities. Doppler parameters are consistent with abnormal   left ventricular relaxation (grade 1 diastolic dysfunction).   Doppler parameters are consistent with high ventricular filling   pressure. - Aortic valve: Mildly to moderately calcified annulus. Trileaflet.  Assessment and Plan:  1.  CAD with history of DES to the LAD in 2014, recently found to be widely patent at follow-up cardiac catheterization, otherwise nonobstructive residual disease to be managed medically.  Plan is to continue aspirin, statin, beta-blocker, and ARB.  She also has as needed nitroglycerin available.  2.  Mixed hyperlipidemia.  She does have previous history of statin intolerance.  She was placed on Lipitor 80 mg daily at hospital discharge, we discussed reducing this to 10 mg daily and otherwise stopping Zetia.  Her LDL was in the 80s in  June.  All of FLP for her next visit.  3.  GERD and esophageal stricture/spasm.  She continues to follow with GI.  4.  Essential hypertension, keep follow-up with Dr. Quillian Quince.  Current medicines were reviewed with the patient today.   Orders Placed This Encounter  Procedures  . Lipid panel    Disposition: Follow-up in 6 months.  Signed, [name], MD, [facility] 02/11/2018 10:07 AM    [place] [facility] [place] [address], [place], [place] [address] Phone: [phone]; Fax: [phone]

## 2018-02-09 NOTE — Telephone Encounter (Signed)
Spoke to patient and she wishes to schedule the tests prior to her dental work. I was able to schedule her for 02/16/18 at 0830. Sent her the prep information and verbally went over instructions about holding her PPI 5 days prior. Patient aware that she will need to go back the next day to [facility] to return endo equipment.

## 2018-02-11 ENCOUNTER — Encounter: Payer: Self-pay | Admitting: Cardiology

## 2018-02-11 ENCOUNTER — Other Ambulatory Visit: Payer: Self-pay

## 2018-02-11 ENCOUNTER — Ambulatory Visit: Payer: Medicare HMO | Admitting: Cardiology

## 2018-02-11 ENCOUNTER — Encounter: Payer: Self-pay | Admitting: *Deleted

## 2018-02-11 ENCOUNTER — Other Ambulatory Visit: Payer: Self-pay | Admitting: *Deleted

## 2018-02-11 VITALS — BP 158/72 | HR 63 | Ht 62.0 in | Wt 160.0 lb

## 2018-02-11 DIAGNOSIS — E782 Mixed hyperlipidemia: Secondary | ICD-10-CM | POA: Diagnosis not present

## 2018-02-11 DIAGNOSIS — I25119 Atherosclerotic heart disease of native coronary artery with unspecified angina pectoris: Secondary | ICD-10-CM

## 2018-02-11 DIAGNOSIS — K219 Gastro-esophageal reflux disease without esophagitis: Secondary | ICD-10-CM

## 2018-02-11 DIAGNOSIS — I10 Essential (primary) hypertension: Secondary | ICD-10-CM | POA: Diagnosis not present

## 2018-02-11 MED ORDER — ATORVASTATIN CALCIUM 10 MG PO TABS: 10.0000 mg | ORAL_TABLET | Freq: Every day | ORAL | 3 refills | Status: DC

## 2018-02-11 NOTE — Patient Instructions (Addendum)
Medication Instructions:   Your physician has recommended you make the following change in your medication:   Stop zetia  Decrease atorvastatin to 10 mg by mouth daily.  Continue all other medications the same.  Labwork:  Your physician recommends that you return for a FASTING lipid profile: in 6 months just before your next visit. Lab order given during visit  Testing/Procedures:  NONE  Follow-Up:  Your physician recommends that you schedule a follow-up appointment in: 6 months. You will receive a reminder letter in the mail in about 4 months reminding you to call and schedule your appointment. If you don't receive this letter, please contact our office.  Any Other Special Instructions Will Be Listed Below (If Applicable).  If you need a refill on your cardiac medications before your next appointment, please call your pharmacy.

## 2018-02-16 ENCOUNTER — Encounter (HOSPITAL_COMMUNITY): Payer: Self-pay | Admitting: Gastroenterology

## 2018-02-16 ENCOUNTER — Ambulatory Visit (HOSPITAL_COMMUNITY)
Admission: RE | Admit: 2018-02-16 | Discharge: 2018-02-16 | Disposition: A | Discharge status: Home / Self Care | Payer: Medicare HMO | Source: Ambulatory Visit | Attending: Gastroenterology | Admitting: Gastroenterology

## 2018-02-16 ENCOUNTER — Encounter (HOSPITAL_COMMUNITY)
Admission: RE | Disposition: A | Discharge status: Home / Self Care | Payer: Self-pay | Source: Ambulatory Visit | Attending: Gastroenterology

## 2018-02-16 DIAGNOSIS — K219 Gastro-esophageal reflux disease without esophagitis: Secondary | ICD-10-CM | POA: Diagnosis present

## 2018-02-16 DIAGNOSIS — R1110 Vomiting, unspecified: Secondary | ICD-10-CM | POA: Diagnosis present

## 2018-02-16 DIAGNOSIS — R12 Heartburn: Secondary | ICD-10-CM

## 2018-02-16 HISTORY — PX: 24 HOUR PH STUDY: SHX5419

## 2018-02-16 HISTORY — PX: ESOPHAGEAL MANOMETRY: SHX5429

## 2018-02-16 HISTORY — PX: PH IMPEDANCE STUDY: SHX5565

## 2018-02-16 SURGERY — MANOMETRY, ESOPHAGUS
Anesthesia: Topical

## 2018-02-16 MED ORDER — LIDOCAINE VISCOUS HCL 2 % MT SOLN
OROMUCOSAL | Status: AC
  Filled 2018-02-16: qty 15

## 2018-02-16 SURGICAL SUPPLY — 2 items
FACESHIELD LNG OPTICON STERILE (SAFETY) IMPLANT
GLOVE BIO SURGEON STRL SZ8 (GLOVE) ×4 IMPLANT

## 2018-02-16 NOTE — Progress Notes (Signed)
Esophageal Manometry done per protocol. Pt tolerated well without complication or distress. Ph probe placed per protocol without complication or distress. Teaching down with patient regarding study and use of monitor using teach back . Pt verbalized understanding.Pt to return to unit tomorrow at 0900 or after to have probe removed and monitor downloaded.

## 2018-02-17 DIAGNOSIS — K219 Gastro-esophageal reflux disease without esophagitis: Secondary | ICD-10-CM | POA: Diagnosis not present

## 2018-02-17 DIAGNOSIS — R12 Heartburn: Secondary | ICD-10-CM | POA: Diagnosis not present

## 2018-02-20 ENCOUNTER — Other Ambulatory Visit: Payer: Self-pay | Admitting: Gastroenterology

## 2018-03-04 ENCOUNTER — Telehealth: Payer: Self-pay | Admitting: Gastroenterology

## 2018-03-04 NOTE — Telephone Encounter (Signed)
Patient is still having significant reflux on Nexium 40 mg BID, is having to take Tums at night. She asked about the manometry results, let her know that it does take a few weeks to get those results. Dr. Silverio Decamp is out of the office this week.

## 2018-03-04 NOTE — Telephone Encounter (Signed)
Carafate 1 gram /10cc slurry.   10cc by mouth prior to bed Disp 300 cc  RF 1  Must wait for pH/manometry results.

## 2018-03-07 ENCOUNTER — Other Ambulatory Visit: Payer: Self-pay

## 2018-03-07 MED ORDER — SUCRALFATE 1 GM/10ML PO SUSP: 1.0000 g | Freq: Every day | ORAL | 1 refills | Status: DC

## 2018-03-07 NOTE — Telephone Encounter (Signed)
Patient advised of results and also will need to wait on test results.

## 2018-03-08 DIAGNOSIS — R12 Heartburn: Secondary | ICD-10-CM

## 2018-03-16 DIAGNOSIS — I700 Atherosclerosis of aorta: Secondary | ICD-10-CM | POA: Diagnosis not present

## 2018-03-16 DIAGNOSIS — E1169 Type 2 diabetes mellitus with other specified complication: Secondary | ICD-10-CM | POA: Diagnosis not present

## 2018-03-16 DIAGNOSIS — I2510 Atherosclerotic heart disease of native coronary artery without angina pectoris: Secondary | ICD-10-CM | POA: Diagnosis not present

## 2018-04-09 ENCOUNTER — Other Ambulatory Visit: Payer: Self-pay | Admitting: Cardiology

## 2018-04-20 DIAGNOSIS — E782 Mixed hyperlipidemia: Secondary | ICD-10-CM | POA: Diagnosis not present

## 2018-04-20 DIAGNOSIS — G4733 Obstructive sleep apnea (adult) (pediatric): Secondary | ICD-10-CM | POA: Diagnosis not present

## 2018-04-20 DIAGNOSIS — E1165 Type 2 diabetes mellitus with hyperglycemia: Secondary | ICD-10-CM | POA: Diagnosis not present

## 2018-04-20 DIAGNOSIS — K210 Gastro-esophageal reflux disease with esophagitis: Secondary | ICD-10-CM | POA: Diagnosis not present

## 2018-04-20 DIAGNOSIS — E1151 Type 2 diabetes mellitus with diabetic peripheral angiopathy without gangrene: Secondary | ICD-10-CM | POA: Diagnosis not present

## 2018-04-20 DIAGNOSIS — I10 Essential (primary) hypertension: Secondary | ICD-10-CM | POA: Diagnosis not present

## 2018-04-21 ENCOUNTER — Ambulatory Visit: Payer: Medicare HMO | Admitting: Internal Medicine

## 2018-04-27 DIAGNOSIS — J301 Allergic rhinitis due to pollen: Secondary | ICD-10-CM | POA: Diagnosis not present

## 2018-04-27 DIAGNOSIS — Z23 Encounter for immunization: Secondary | ICD-10-CM | POA: Diagnosis not present

## 2018-04-27 DIAGNOSIS — G4733 Obstructive sleep apnea (adult) (pediatric): Secondary | ICD-10-CM | POA: Diagnosis not present

## 2018-04-27 DIAGNOSIS — K219 Gastro-esophageal reflux disease without esophagitis: Secondary | ICD-10-CM | POA: Diagnosis not present

## 2018-04-27 DIAGNOSIS — Z0001 Encounter for general adult medical examination with abnormal findings: Secondary | ICD-10-CM | POA: Diagnosis not present

## 2018-04-27 DIAGNOSIS — I2510 Atherosclerotic heart disease of native coronary artery without angina pectoris: Secondary | ICD-10-CM | POA: Diagnosis not present

## 2018-04-27 DIAGNOSIS — I6523 Occlusion and stenosis of bilateral carotid arteries: Secondary | ICD-10-CM | POA: Diagnosis not present

## 2018-04-27 DIAGNOSIS — E782 Mixed hyperlipidemia: Secondary | ICD-10-CM | POA: Diagnosis not present

## 2018-04-27 DIAGNOSIS — E1151 Type 2 diabetes mellitus with diabetic peripheral angiopathy without gangrene: Secondary | ICD-10-CM | POA: Diagnosis not present

## 2018-04-27 DIAGNOSIS — I10 Essential (primary) hypertension: Secondary | ICD-10-CM | POA: Diagnosis not present

## 2018-04-27 DIAGNOSIS — Z6828 Body mass index (BMI) 28.0-28.9, adult: Secondary | ICD-10-CM | POA: Diagnosis not present

## 2018-04-27 DIAGNOSIS — R69 Illness, unspecified: Secondary | ICD-10-CM | POA: Diagnosis not present

## 2018-04-27 DIAGNOSIS — Z1212 Encounter for screening for malignant neoplasm of rectum: Secondary | ICD-10-CM | POA: Diagnosis not present

## 2018-04-27 DIAGNOSIS — K580 Irritable bowel syndrome with diarrhea: Secondary | ICD-10-CM | POA: Diagnosis not present

## 2018-05-03 ENCOUNTER — Encounter: Payer: Self-pay | Admitting: Gastroenterology

## 2018-05-03 ENCOUNTER — Ambulatory Visit: Payer: Medicare HMO | Admitting: Gastroenterology

## 2018-05-03 VITALS — BP 112/48 | HR 62 | Ht 62.0 in | Wt 160.0 lb

## 2018-05-03 DIAGNOSIS — R079 Chest pain, unspecified: Secondary | ICD-10-CM

## 2018-05-03 DIAGNOSIS — K224 Dyskinesia of esophagus: Secondary | ICD-10-CM | POA: Diagnosis not present

## 2018-05-03 DIAGNOSIS — K219 Gastro-esophageal reflux disease without esophagitis: Secondary | ICD-10-CM | POA: Diagnosis not present

## 2018-05-03 NOTE — Patient Instructions (Signed)
If you are age 69 or older, your body mass index should be between 23-30. Your Body mass index is 29.26 kg/m. If this is out of the aforementioned range listed, please consider follow up with your Primary Care Provider.  If you are age 84 or younger, your body mass index should be between 19-25. Your Body mass index is 29.26 kg/m. If this is out of the aformentioned range listed, please consider follow up with your Primary Care Provider.   Follow up as needed.  It was a pleasure to see you today!  Dr. [name]

## 2018-05-03 NOTE — Progress Notes (Signed)
[facility] Progress Note  Chief Complaint: GERD and dysphagia  Subjective  History:  Last seen August 2018 for diarrhea type IBS as well as chronic GERD symptoms with heartburn and pain with intermittent dysphagia.  Upper endoscopy April 2017 normal except for a widely patent Schatzki ring, normal esophageal manometry shortly after that. She was referred back to see [place] a few months ago and evaluated by 1 of our PAs.  At that time, she was sent by cardiology for concerns of noncardiac chest pain possibly triggered by GERD.  Cardiac catheterization showed a patent LAD stent and otherwise noncritical CAD and circumflex coronary disease with normal LV function.  Her PPI have been stopped sometime prior to the June office visit with [place].  In early June she had a severe episode of chest pain while sitting in the car was described as a pressure sensation with shortness of breath.  It eased off after taking nitroglycerin. She was then put back on PPI twice daily and set up with me for an upper endoscopy, which was again normal except for a widely patent Schatzki ring and small hiatal hernia.  Esophageal manometry 02/16/2018 showed abated DCI and some primary reactions consistent with distal esophageal spasm. 24-hour pH and impedance testing showed no more than a physiologic amount of reflux, most of which was acid reflux.  Meghana still reports episodic overnight and early morning regurgitation with heartburn.  She is trying hard to eat better, not eat within several hours of bed, and she has been able to elevate head of bed. She has frequent episodes of esophageal spasm with tightness and chest pain and feelings that food or liquids will not pass.  ROS:  Respiratory: no dyspnea Chronic musculoskeletal pain from fibromyalgia Anxiety Remainder of systems negative except as above  The patient's Past Medical, Family and Social History were reviewed and are on file in the  EMR.  Objective:  Med list reviewed  Current Outpatient Medications:  .  albuterol (PROVENTIL HFA;VENTOLIN HFA) 108 (90 Base) MCG/ACT inhaler, Inhale 2 puffs into the lungs every 4 (four) hours as needed., Disp: , Rfl: 3 .  ALPRAZolam (XANAX) 1 MG tablet, TAKE 1 TABLET BY MOUTH AT BEDTIME AS NEEDED FOR ANXIETY, Disp: , Rfl: 1 .  amLODipine (NORVASC) 5 MG tablet, TAKE 1 AND 1/2 TABLETS BY MOUTH EVERY DAY, Disp: 135 tablet, Rfl: 1 .  aspirin EC 81 MG tablet, Take 81 mg by mouth daily., Disp: , Rfl:  .  atorvastatin (LIPITOR) 10 MG tablet, Take 1 tablet (10 mg total) by mouth daily., Disp: 90 tablet, Rfl: 3 .  DULoxetine (CYMBALTA) 60 MG capsule, Take 60 mg by mouth daily. , Disp: , Rfl:  .  esomeprazole (NEXIUM) 40 MG capsule, Take 40 mg by mouth 2 (two) times daily before a meal., Disp: , Rfl:  .  Guaifenesin (MUCINEX MAXIMUM STRENGTH) 1200 MG TB12, Take 1 tablet by mouth 2 (two) times daily as needed., Disp: , Rfl:  .  HYDROcodone-acetaminophen (NORCO/VICODIN) 5-325 MG tablet, 1 tablet 2 (two) times daily. , Disp: , Rfl: 0 .  loratadine (CLARITIN) 10 MG tablet, Take 10 mg by mouth daily. , Disp: , Rfl:  .  losartan-hydrochlorothiazide (HYZAAR) 100-25 MG tablet, Take 1 tablet by mouth daily. , Disp: , Rfl:  .  metoprolol tartrate (LOPRESSOR) 50 MG tablet, TAKE 1 TABLET BY MOUTH TWICE DAILY, Disp: 180 tablet, Rfl: 0 .  montelukast (SINGULAIR) 10 MG tablet, Take 10 mg by mouth at  bedtime. , Disp: , Rfl:  .  nitroGLYCERIN (NITROSTAT) 0.4 MG SL tablet, Place 0.4 mg under the tongue every 5 (five) minutes as needed for chest pain., Disp: , Rfl:  .  promethazine (PHENERGAN) 25 MG tablet, Take 25 mg by mouth at bedtime. , Disp: , Rfl:  .  sucralfate (CARAFATE) 1 GM/10ML suspension, Take 10 mLs (1 g total) by mouth at bedtime. (Patient taking differently: Take 1 g by mouth at bedtime as needed. ), Disp: 300 mL, Rfl: 1 .  traZODone (DESYREL) 50 MG tablet, Take 200 mg by mouth at bedtime. 4 tabs hs,  Disp: , Rfl:  .  TRESIBA FLEXTOUCH 200 UNIT/ML SOPN, Inject 100 Units into the skin 2 (two) times daily as needed. As directed, Disp: , Rfl:    Vital signs in last 24 hrs: Vitals:   05/03/18 1358  BP: (!) 112/48  Pulse: 62    Physical Exam  Well-appearing, normal vocal quality  HEENT: sclera anicteric, oral mucosa moist without lesions  Neck: supple, no thyromegaly, JVD or lymphadenopathy  Cardiac: RRR without murmurs, S1S2 heard, no peripheral edema.  She has right lower rib cage tenderness  Pulm: clear to auscultation bilaterally, normal RR and effort noted  Abdomen: soft, no tenderness, with active bowel sounds. No guarding or palpable hepatosplenomegaly.  Skin; warm and dry, no jaundice or rash  Recent Labs:  PCP labs 04/20/18:  nml CBC ALT 40, Glucose 108, CMP o/w nml Hgb A1c 7.7 TSH nml   Additional data as noted above   @ASSESSMENTPLANBEGIN @ Assessment: Encounter Diagnoses  Name Primary?  . Gastroesophageal reflux disease without esophagitis Yes  . Chest pain, unspecified type   . Esophageal spasm    pH study demonstrates only physiologic reflux over 24-hour.  I suspect she may be having isolated episodes not picked up on this study, especially overnight, but this still does not account for her chest pain.  She has esophageal spasm that is exacerbated by visceral hypersensitivity with underlying fibromyalgia and anxiety disorder.   Plan:  Calcium channel blocker such as diltiazem or long-acting nitroglycerin might be helpful for this.  Ever, a trial of either those would be best managed by cardiology given how it will affect her blood pressure.  Perhaps nifedipine or long-acting nitrate could be substituted for 1 of her medicines rather than an additional med.  I will communicate with her cardiologist, Dr. Domenic Polite, to get his thoughts on it.  Meanwhile, she has little demonstrated reflux and it is not clear the PPI has been helping her much, she will  decrease Nexium to every other day for a week, and then stop.  That can always be resumed if symptoms worsen.  Total time 30 minutes, over half spent face-to-face with patient in counseling and coordination of care.   Nelida Meuse III

## 2018-05-04 ENCOUNTER — Telehealth: Payer: Self-pay | Admitting: *Deleted

## 2018-05-04 MED ORDER — ISOSORBIDE MONONITRATE ER 30 MG PO TB24: 15.0000 mg | ORAL_TABLET | Freq: Every day | ORAL | 1 refills | Status: DC

## 2018-05-04 MED ORDER — AMLODIPINE BESYLATE 5 MG PO TABS: 5.0000 mg | ORAL_TABLET | Freq: Every day | ORAL | 1 refills | Status: DC

## 2018-05-04 NOTE — Telephone Encounter (Signed)
Patient informed and verbalized understanding of plan. 

## 2018-05-04 NOTE — Progress Notes (Signed)
Thanks for your reply.  Given her cardiovascular history and medication regimen, I would feel more comfortable if you would manage this change.  And I will follow her to see how GI symptoms respond.  - HD

## 2018-05-04 NOTE — Telephone Encounter (Signed)
-----   Message from [name], MD sent at 05/04/2018  9:53 AM EDT ----- Alma Friendly - please see my recent message to Dr. [name] regarding medication adjustments related to treatment of esophageal spasm.  If you would contact the patient about these changes and make the necessary adjustments in her regimen I would appreciate it.  We can continue with our same follow-up plan.  ----- Message ----- From: Doran Stabler, MD Sent: 05/04/2018   9:45 AM EDT To: [name], MD    ----- Message ----- From: [name], MD Sent: 05/04/2018   8:02 AM EDT To: Doran Stabler, MD  Thank you for the question Mallie Mussel.  I reviewed my last note.  It might be best to try her on low-dose Imdur 15 mg daily to start (titrate if needed) and concurrently reduce Norvasc to 5 mg daily from 7.5 mg daily.  If we switched her Norvasc out for diltiazem or verapamil, this might lower her heart rate too much since she is also on Lopressor, I would like to keep her on that.  If you would like for [place] to make these medication changes let me know and I can have nursing contact her.  ----- Message ----- From: Doran Stabler, MD Sent: 05/03/2018   2:59 PM EDT To: [name], MD  Sam,    Please review my note on this mutual patient and let me know what you think about medical management.  Thanks  [facility]

## 2018-06-10 DIAGNOSIS — J329 Chronic sinusitis, unspecified: Secondary | ICD-10-CM | POA: Diagnosis not present

## 2018-06-10 DIAGNOSIS — Z6829 Body mass index (BMI) 29.0-29.9, adult: Secondary | ICD-10-CM | POA: Diagnosis not present

## 2018-07-14 ENCOUNTER — Ambulatory Visit: Payer: Medicare HMO | Admitting: Internal Medicine

## 2018-07-27 ENCOUNTER — Ambulatory Visit: Payer: Medicare HMO | Admitting: Internal Medicine

## 2018-08-12 ENCOUNTER — Ambulatory Visit: Payer: Medicare HMO | Admitting: Cardiology

## 2018-08-18 DIAGNOSIS — R69 Illness, unspecified: Secondary | ICD-10-CM | POA: Diagnosis not present

## 2018-08-18 DIAGNOSIS — E782 Mixed hyperlipidemia: Secondary | ICD-10-CM | POA: Diagnosis not present

## 2018-08-23 DIAGNOSIS — E1151 Type 2 diabetes mellitus with diabetic peripheral angiopathy without gangrene: Secondary | ICD-10-CM | POA: Diagnosis not present

## 2018-08-23 DIAGNOSIS — K210 Gastro-esophageal reflux disease with esophagitis: Secondary | ICD-10-CM | POA: Diagnosis not present

## 2018-08-23 DIAGNOSIS — E782 Mixed hyperlipidemia: Secondary | ICD-10-CM | POA: Diagnosis not present

## 2018-08-23 DIAGNOSIS — E1165 Type 2 diabetes mellitus with hyperglycemia: Secondary | ICD-10-CM | POA: Diagnosis not present

## 2018-08-23 DIAGNOSIS — I10 Essential (primary) hypertension: Secondary | ICD-10-CM | POA: Diagnosis not present

## 2018-08-23 DIAGNOSIS — I2510 Atherosclerotic heart disease of native coronary artery without angina pectoris: Secondary | ICD-10-CM | POA: Diagnosis not present

## 2018-08-23 DIAGNOSIS — M797 Fibromyalgia: Secondary | ICD-10-CM | POA: Diagnosis not present

## 2018-08-29 DIAGNOSIS — I6523 Occlusion and stenosis of bilateral carotid arteries: Secondary | ICD-10-CM | POA: Diagnosis not present

## 2018-08-29 DIAGNOSIS — I10 Essential (primary) hypertension: Secondary | ICD-10-CM | POA: Diagnosis not present

## 2018-08-29 DIAGNOSIS — R69 Illness, unspecified: Secondary | ICD-10-CM | POA: Diagnosis not present

## 2018-08-29 DIAGNOSIS — Z6829 Body mass index (BMI) 29.0-29.9, adult: Secondary | ICD-10-CM | POA: Diagnosis not present

## 2018-08-29 DIAGNOSIS — I2510 Atherosclerotic heart disease of native coronary artery without angina pectoris: Secondary | ICD-10-CM | POA: Diagnosis not present

## 2018-08-29 DIAGNOSIS — E782 Mixed hyperlipidemia: Secondary | ICD-10-CM | POA: Diagnosis not present

## 2018-08-29 DIAGNOSIS — E1165 Type 2 diabetes mellitus with hyperglycemia: Secondary | ICD-10-CM | POA: Diagnosis not present

## 2018-08-29 DIAGNOSIS — E1151 Type 2 diabetes mellitus with diabetic peripheral angiopathy without gangrene: Secondary | ICD-10-CM | POA: Diagnosis not present

## 2018-09-12 ENCOUNTER — Telehealth: Payer: Self-pay | Admitting: Gastroenterology

## 2018-09-12 NOTE — Telephone Encounter (Signed)
Pt states she has been having back pain and constipation. Reports taking some laxatives with some relief. Has not had anything to eat since yesterday and wondered if she should go ahead and try to eat something. Discussed with pt that she could try liquids and soft foods that are easy to digest. Pt knows to keep her OV as scheduled.

## 2018-09-12 NOTE — Telephone Encounter (Signed)
Pt sched 2.25 OV Dr [name].  Pt reported "horrible pain"  And that she had oral surgery last month.  Pt wanted to know if she should go on a liquid diet.

## 2018-09-13 ENCOUNTER — Ambulatory Visit: Payer: Medicare HMO | Admitting: Gastroenterology

## 2018-09-13 ENCOUNTER — Encounter: Payer: Self-pay | Admitting: Gastroenterology

## 2018-09-13 ENCOUNTER — Other Ambulatory Visit (INDEPENDENT_AMBULATORY_CARE_PROVIDER_SITE_OTHER): Payer: Medicare HMO

## 2018-09-13 VITALS — BP 110/50 | HR 95 | Ht 62.0 in | Wt 162.2 lb

## 2018-09-13 DIAGNOSIS — R140 Abdominal distension (gaseous): Secondary | ICD-10-CM

## 2018-09-13 DIAGNOSIS — K5900 Constipation, unspecified: Secondary | ICD-10-CM | POA: Diagnosis not present

## 2018-09-13 DIAGNOSIS — R1084 Generalized abdominal pain: Secondary | ICD-10-CM | POA: Diagnosis not present

## 2018-09-13 LAB — BUN: BUN: 13 mg/dL (ref 6–23)

## 2018-09-13 LAB — CREATININE, SERUM: CREATININE: 1.04 mg/dL (ref 0.40–1.20)

## 2018-09-13 LAB — CBC WITH DIFFERENTIAL/PLATELET
BASOS ABS: 0.1 10*3/uL (ref 0.0–0.1)
Basophils Relative: 0.7 % (ref 0.0–3.0)
EOS ABS: 0.2 10*3/uL (ref 0.0–0.7)
Eosinophils Relative: 2 % (ref 0.0–5.0)
HEMATOCRIT: 43.9 % (ref 36.0–46.0)
HEMOGLOBIN: 14.9 g/dL (ref 12.0–15.0)
Lymphocytes Relative: 40.2 % (ref 12.0–46.0)
Lymphs Abs: 4.8 10*3/uL — ABNORMAL HIGH (ref 0.7–4.0)
MCHC: 33.9 g/dL (ref 30.0–36.0)
MCV: 98.2 fl (ref 78.0–100.0)
Monocytes Absolute: 1.1 10*3/uL — ABNORMAL HIGH (ref 0.1–1.0)
Monocytes Relative: 9.2 % (ref 3.0–12.0)
NEUTROS ABS: 5.7 10*3/uL (ref 1.4–7.7)
Neutrophils Relative %: 47.9 % (ref 43.0–77.0)
PLATELETS: 324 10*3/uL (ref 150.0–400.0)
RBC: 4.47 Mil/uL (ref 3.87–5.11)
RDW: 12.6 % (ref 11.5–15.5)
WBC: 12 10*3/uL — AB (ref 4.0–10.5)

## 2018-09-13 NOTE — Patient Instructions (Signed)
If you are age 70 or older, your body mass index should be between 23-30. Your Body mass index is 29.68 kg/m. If this is out of the aforementioned range listed, please consider follow up with your Primary Care Provider.  If you are age 30 or younger, your body mass index should be between 19-25. Your Body mass index is 29.68 kg/m. If this is out of the aformentioned range listed, please consider follow up with your Primary Care Provider.   Your provider has requested that you go to the basement level for lab work before leaving today. Press "B" on the elevator. The lab is located at the first door on the left as you exit the elevator.   You have been scheduled for a CT scan of the abdomen and pelvis at [facility] ([address]---this is in the same building as Press [facility]).   You are scheduled on 09-14-2018 at 930am. You should arrive 15 minutes prior to your appointment time for registration. Please follow the written instructions below on the day of your exam:  WARNING: IF YOU ARE ALLERGIC TO IODINE/X-RAY DYE, PLEASE NOTIFY RADIOLOGY IMMEDIATELY AT [phone]! YOU WILL BE GIVEN A 13 HOUR PREMEDICATION PREP.  1) Do not eat or drink anything after 530am (4 hours prior to your test) 2) You have been given 2 bottles of oral contrast to drink. The solution may taste better if refrigerated, but do NOT add ice or any other liquid to this solution. Shake well before drinking.    Drink 1 bottle of contrast @ 730am (2 hours prior to your exam)  Drink 1 bottle of contrast @ 830am (1 hour prior to your exam)  You may take any medications as prescribed with a small amount of water, if necessary. If you take any of the following medications: METFORMIN, GLUCOPHAGE, GLUCOVANCE, AVANDAMET, RIOMET, FORTAMET, Justice MET, JANUMET, GLUMETZA or METAGLIP, you MAY be asked to HOLD this medication 48 hours AFTER the exam.  The purpose of you drinking the oral contrast is to aid in the  visualization of your intestinal tract. The contrast solution may cause some diarrhea. Depending on your individual set of symptoms, you may also receive an intravenous injection of x-ray contrast/dye. Plan on being at [facility] for 30 minutes or longer, depending on the type of exam you are having performed.  This test typically takes 30-45 minutes to complete.  If you have any questions regarding your exam or if you need to reschedule, you may call the CT department at [phone] between the hours of 8:00 am and 5:00 pm, Monday-Friday.  ________________________________________________________________________   Your provider has requested that you go to the basement level for lab work before leaving today. Press "B" on the elevator. The lab is located at the first door on the left as you exit the elevator.  It was a pleasure to see you today!  Dr. [name]

## 2018-09-13 NOTE — Progress Notes (Signed)
[facility] Progress Note  Chief Complaint: Abdominal pain and constipation  Subjective  History:   Last seen October 2019 for diffuse esophageal spasm.  Very little reflux demonstrated on pH testing, so I recommended weaning off PPI.  I also messaged her cardiologist about long-acting nitrate or calcium channel blocker, and they prescribed Imdur. That problem seemed to settle down afterwards.  About 9 days ago she developed constipation, took several laxatives over the next few days without much improvement.  She took a bottle of magnesium citrate a few days ago without much improvement.  She feels that she is straining and the stools are small and pellet-like.  Her abdominal pain has been generalized and fairly constant over these last 9 days.  She recalls that it first started with severe back pain, which has diminished somewhat. She denies numbness or weakness of the legs or feet. No rectal bleeding.  ROS: Cardiovascular:  no chest pain Respiratory: no dyspnea Chronic anxiety Remainder of systems negative except as above  The patient's Past Medical, Family and Social History were reviewed and are on file in the EMR.  Objective:  Med list reviewed  Current Outpatient Medications:  .  albuterol (PROVENTIL HFA;VENTOLIN HFA) 108 (90 Base) MCG/ACT inhaler, Inhale 2 puffs into the lungs every 4 (four) hours as needed., Disp: , Rfl: 3 .  ALPRAZolam (XANAX) 1 MG tablet, TAKE 1 TABLET BY MOUTH AT BEDTIME AS NEEDED FOR ANXIETY, Disp: , Rfl: 1 .  amLODipine (NORVASC) 5 MG tablet, Take 1 tablet (5 mg total) by mouth daily., Disp: 90 tablet, Rfl: 1 .  aspirin EC 81 MG tablet, Take 81 mg by mouth daily., Disp: , Rfl:  .  atorvastatin (LIPITOR) 10 MG tablet, Take 1 tablet (10 mg total) by mouth daily., Disp: 90 tablet, Rfl: 3 .  DULoxetine (CYMBALTA) 60 MG capsule, Take 60 mg by mouth daily. , Disp: , Rfl:  .  HYDROcodone-acetaminophen (NORCO/VICODIN) 5-325 MG tablet, 1 tablet 2  (two) times daily. , Disp: , Rfl: 0 .  isosorbide mononitrate (IMDUR) 30 MG 24 hr tablet, Take 0.5 tablets (15 mg total) by mouth daily., Disp: 45 tablet, Rfl: 1 .  loratadine (CLARITIN) 10 MG tablet, Take 10 mg by mouth daily. , Disp: , Rfl:  .  metoprolol tartrate (LOPRESSOR) 50 MG tablet, TAKE 1 TABLET BY MOUTH TWICE DAILY, Disp: 180 tablet, Rfl: 0 .  montelukast (SINGULAIR) 10 MG tablet, Take 10 mg by mouth at bedtime. , Disp: , Rfl:  .  nitroGLYCERIN (NITROSTAT) 0.4 MG SL tablet, Place 0.4 mg under the tongue every 5 (five) minutes as needed for chest pain., Disp: , Rfl:  .  promethazine (PHENERGAN) 25 MG tablet, Take 25 mg by mouth at bedtime. , Disp: , Rfl:  .  traZODone (DESYREL) 50 MG tablet, Take 200 mg by mouth at bedtime. 4 tabs hs, Disp: , Rfl:  .  TRESIBA FLEXTOUCH 200 UNIT/ML SOPN, Inject 100 Units into the skin 2 (two) times daily as needed. As directed, Disp: , Rfl:  .  hydrochlorothiazide (HYDRODIURIL) 25 MG tablet, Take 25 mg by mouth daily., Disp: , Rfl:  .  losartan (COZAAR) 100 MG tablet, Take 100 mg by mouth daily., Disp: , Rfl:    Last colonoscopy 11/2014 [name]) - severe left diverticulosis  Vital signs in last 24 hrs: Vitals:   09/13/18 1543  BP: (!) 110/50  Pulse: 95   Pulse was in the 70s when I examined her.  Physical Exam  She is visibly uncomfortable, holding her back in her stomach.  She can get on exam table without assistance.  HEENT: sclera anicteric, oral mucosa moist without lesions  Neck: supple, no thyromegaly, JVD or lymphadenopathy  Cardiac: RRR without murmurs, S1S2 heard, no peripheral edema  Pulm: clear to auscultation bilaterally, normal RR and effort noted  Abdomen: soft, softly distended with generalized tenderness, though more so on the right side. active bowel sounds of normal character. No guarding or palpable hepatosplenomegaly.  Skin; warm and dry, no jaundice or rash  Recent Labs:  Primary care labs 08/23/2018: Sodium 145,  glucose 124, CMP otherwise normal.  Hemoglobin A1c 8.4   Radiologic studies: Last CT abdomen October 2015 showing extensive left-sided diverticulosis with wall thickening, no adjacent inflammation.   @ASSESSMENTPLANBEGIN @ Assessment: Encounter Diagnoses  Name Primary?  . Generalized abdominal pain Yes  . Abdominal distension   . Acute constipation     This seems most likely to be a severe episode of acute constipation in the setting of known severe diverticulosis.  It could be acute diverticulitis, though her pain and tenderness seem to diffuse for that.  Plan: CBC today, also BUN and creatinine CT scan abdomen and pelvis with oral and IV contrast tomorrow.  I suspect the oral contrast will also help relieve the constipation.  1 packet of MiraLAX tonight and again tomorrow morning.   Total time 25 minutes, over half spent face-to-face with patient in counseling and coordination of care.   Nelida Meuse III

## 2018-09-14 ENCOUNTER — Ambulatory Visit (INDEPENDENT_AMBULATORY_CARE_PROVIDER_SITE_OTHER)
Admission: RE | Admit: 2018-09-14 | Discharge: 2018-09-14 | Disposition: A | Discharge status: Home / Self Care | Payer: Medicare HMO | Source: Ambulatory Visit | Attending: Gastroenterology | Admitting: Gastroenterology

## 2018-09-14 DIAGNOSIS — K5900 Constipation, unspecified: Secondary | ICD-10-CM | POA: Diagnosis not present

## 2018-09-14 DIAGNOSIS — K5730 Diverticulosis of large intestine without perforation or abscess without bleeding: Secondary | ICD-10-CM | POA: Diagnosis not present

## 2018-09-14 DIAGNOSIS — R1084 Generalized abdominal pain: Secondary | ICD-10-CM

## 2018-09-14 DIAGNOSIS — R140 Abdominal distension (gaseous): Secondary | ICD-10-CM

## 2018-09-14 MED ORDER — IOPAMIDOL (ISOVUE-300) INJECTION 61%
100.0000 mL | Freq: Once | INTRAVENOUS | Status: AC | PRN
  Administered 2018-09-14: 100 mL via INTRAVENOUS

## 2018-09-15 ENCOUNTER — Telehealth: Payer: Self-pay | Admitting: Gastroenterology

## 2018-09-15 NOTE — Telephone Encounter (Signed)
Pt states she took the miralax prep and has gone to the bathroom, states the stool was very pale and figures that was the CT contrast. Pt passed a little more stool She will let us know if she passes anymore stool this evening.

## 2018-09-15 NOTE — Telephone Encounter (Signed)
Pt called wanting to speak with the nurse about the advice that was giving for the miralax.

## 2018-09-19 NOTE — Telephone Encounter (Signed)
Pt states she is passing stool but it is like pellets. She is taking miralax in the am and pm. Discussed with pt that she can take 3 doses of miralax daily and she can resume her regular diet. Pt verbalized understanding.

## 2018-09-19 NOTE — Telephone Encounter (Signed)
Pt reported that she has been taking Miralax qAM and qPM.  She is mostly on a liquid diet and takes cream of chicken soup.  Pt stated that she is still passing stool and has had no actual meals since last week.  Please CB to advise.

## 2018-09-26 ENCOUNTER — Ambulatory Visit: Payer: Medicare HMO | Admitting: Cardiology

## 2018-09-29 ENCOUNTER — Ambulatory Visit: Payer: Medicare HMO | Admitting: Internal Medicine

## 2018-09-30 ENCOUNTER — Encounter: Payer: Self-pay | Admitting: Cardiology

## 2018-09-30 ENCOUNTER — Ambulatory Visit: Payer: Medicare HMO | Admitting: Cardiology

## 2018-09-30 ENCOUNTER — Other Ambulatory Visit: Payer: Self-pay

## 2018-09-30 VITALS — BP 104/56 | HR 59 | Ht 62.0 in | Wt 163.2 lb

## 2018-09-30 DIAGNOSIS — K219 Gastro-esophageal reflux disease without esophagitis: Secondary | ICD-10-CM | POA: Diagnosis not present

## 2018-09-30 DIAGNOSIS — I10 Essential (primary) hypertension: Secondary | ICD-10-CM | POA: Diagnosis not present

## 2018-09-30 DIAGNOSIS — I25119 Atherosclerotic heart disease of native coronary artery with unspecified angina pectoris: Secondary | ICD-10-CM

## 2018-09-30 DIAGNOSIS — E782 Mixed hyperlipidemia: Secondary | ICD-10-CM | POA: Diagnosis not present

## 2018-09-30 NOTE — Patient Instructions (Addendum)

## 2018-09-30 NOTE — Progress Notes (Signed)
Cardiology Office Note  Date: 09/30/2018   ID: [name], DOB August 03, 1950, MRN [id number]  PCP: Caryl Bis, MD  Primary Cardiologist: Rozann Lesches, MD   Chief Complaint  Patient presents with  . Coronary Artery Disease    History of Present Illness: [name] is a 68 y.o. female last seen in July 2019.  She is here for a routine follow-up visit.  She does not report any active angina symptoms at this time on medical therapy.  She states that her GI symptoms have settled down somewhat as well.  She is not exercising at this point, I talked with her about a walking plan.  Reviewed her medications.  Current cardiac regimen includes aspirin, Lipitor, Norvasc, HCTZ, Imdur, Lopressor, Cozaar, and as needed nitroglycerin.  I reviewed her follow-up lab work from February as outlined below.  Past Medical History:  Diagnosis Date  . Anxiety   . Arthritis   . Asthma   . Barrett esophagus   . Chronic bronchitis ([place])   . Chronic diarrhea   . Chronic lower back pain   . Coronary atherosclerosis of native coronary artery    DES to LAD May 2014  . Diverticulosis   . Essential hypertension   . Fatty liver   . Fibromyalgia   . GERD (gastroesophageal reflux disease)   . H/O hiatal hernia   . IBS (irritable bowel syndrome)   . Major depression in partial remission ([place])   . Migraines   . Mixed hyperlipidemia   . OSA on CPAP   . Pancreatitis 2008  . Pneumonia 1990's  . Sleep apnea   . Type 2 diabetes mellitus ([place])     Past Surgical History:  Procedure Laterality Date  . [place] STUDY N/A 02/16/2018   Procedure: [place] STUDY;  Surgeon: Mauri Pole, MD;  Location: [facility];  Service: Endoscopy;  Laterality: N/A;  . ABDOMINAL HYSTERECTOMY  ?1987  . APPENDECTOMY  ?1987  . BUNIONECTOMY Bilateral 1972  . CARPAL TUNNEL RELEASE Bilateral ?1990  . CATARACT EXTRACTION W/ INTRAOCULAR LENS  IMPLANT, BILATERAL Bilateral ?2005  . CESAREAN SECTION  1985  .  CHOLECYSTECTOMY  1990'S  . COLONOSCOPY  10/13   Dr. Britta Mccreedy - tubular adenomas and diverticulosis  . DILATION AND CURETTAGE OF UTERUS  1970's   "probably 3" (12/09/2012)  . ERCP  2008  . ESOPHAGEAL DILATION  ` 2012  . ESOPHAGEAL MANOMETRY N/A 10/21/2015   Procedure: ESOPHAGEAL MANOMETRY (EM);  Surgeon: Doran Stabler, MD;  Location: [facility];  Service: Gastroenterology;  Laterality: N/A;  . ESOPHAGEAL MANOMETRY N/A 02/16/2018   Procedure: ESOPHAGEAL MANOMETRY (EM);  Surgeon: Mauri Pole, MD;  Location: [facility];  Service: Endoscopy;  Laterality: N/A;  . HUMERUS FRACTURE SURGERY Right 07/19/1969   "horst threw me" (12/09/2012)  . LEFT HEART CATH AND CORONARY ANGIOGRAPHY N/A 12/29/2017   Procedure: LEFT HEART CATH AND CORONARY ANGIOGRAPHY;  Surgeon: Wellington Hampshire, MD;  Location: [facility];  Service: Cardiovascular;  Laterality: N/A;  . NASAL SEPTUM SURGERY  1970's  . PERCUTANEOUS CORONARY STENT INTERVENTION (PCI-S) N/A 12/09/2012   Procedure: PERCUTANEOUS CORONARY STENT INTERVENTION (PCI-S);  Surgeon: Burnell Blanks, MD;  Location: [facility] CATH LAB;  Service: Cardiovascular;  Laterality: N/A;  . [place] IMPEDANCE STUDY N/A 02/16/2018   Procedure: [place] IMPEDANCE STUDY;  Surgeon: Mauri Pole, MD;  Location: [facility];  Service: Endoscopy;  Laterality: N/A;  . TOE FUSION Right 10/11  First MTP joint  . TOE SURGERY Right 1990's   "toe next to big toe:  dr said I had tumor; cut bone & stuff; another OR dr stretched tendons, etc" (12/09/2012)  . TUBAL LIGATION  1985    Current Outpatient Medications  Medication Sig Dispense Refill  . albuterol (PROVENTIL HFA;VENTOLIN HFA) 108 (90 Base) MCG/ACT inhaler Inhale 2 puffs into the lungs every 4 (four) hours as needed.  3  . ALPRAZolam (XANAX) 1 MG tablet TAKE 1 TABLET BY MOUTH AT BEDTIME AS NEEDED FOR ANXIETY  1  . amLODipine (NORVASC) 5 MG tablet Take 1 tablet (5 mg total) by mouth daily. 90 tablet 1  . aspirin EC 81  MG tablet Take 81 mg by mouth daily.    Marland Kitchen atorvastatin (LIPITOR) 10 MG tablet Take 1 tablet (10 mg total) by mouth daily. 90 tablet 3  . DULoxetine (CYMBALTA) 60 MG capsule Take 60 mg by mouth daily.     . hydrochlorothiazide (HYDRODIURIL) 25 MG tablet Take 25 mg by mouth daily.    Marland Kitchen HYDROcodone-acetaminophen (NORCO/VICODIN) 5-325 MG tablet 1 tablet 2 (two) times daily.   0  . isosorbide mononitrate (IMDUR) 30 MG 24 hr tablet Take 0.5 tablets (15 mg total) by mouth daily. 45 tablet 1  . loratadine (CLARITIN) 10 MG tablet Take 10 mg by mouth daily.     Marland Kitchen losartan (COZAAR) 100 MG tablet Take 100 mg by mouth daily.    . metoprolol tartrate (LOPRESSOR) 50 MG tablet TAKE 1 TABLET BY MOUTH TWICE DAILY 180 tablet 0  . montelukast (SINGULAIR) 10 MG tablet Take 10 mg by mouth at bedtime.     . nitroGLYCERIN (NITROSTAT) 0.4 MG SL tablet Place 0.4 mg under the tongue every 5 (five) minutes as needed for chest pain.    . promethazine (PHENERGAN) 25 MG tablet Take 25 mg by mouth at bedtime.     . traZODone (DESYREL) 50 MG tablet Take 200 mg by mouth at bedtime. 4 tabs hs    . TRESIBA FLEXTOUCH 200 UNIT/ML SOPN Inject 100 Units into the skin 2 (two) times daily as needed. As directed     No current facility-administered medications for this visit.    Allergies:  Erythromycin base; Azithromycin; Other; Pollen extract; Remeron [mirtazapine]; Tree extract; Zolpidem tartrate; and Sulfonamide derivatives   Social History: The patient  reports that she quit smoking about 25 years ago. Her smoking use included cigarettes. She started smoking about 42 years ago. She has a 12.00 pack-year smoking history. She has never used smokeless tobacco. She reports that she does not drink alcohol or use drugs.   ROS:  Please see the history of present illness. Otherwise, complete review of systems is positive for pain due to fibromyalgia.  All other systems are reviewed and negative.   Physical Exam: VS:  BP (!) 104/56    Pulse (!) 59   Ht 5\' 2"  (1.575 m)   Wt 163 lb 3.2 oz (74 kg)   SpO2 95%   BMI 29.85 kg/m , BMI Body mass index is 29.85 kg/m.  Wt Readings from Last 3 Encounters:  09/30/18 163 lb 3.2 oz (74 kg)  09/13/18 162 lb 4 oz (73.6 kg)  05/03/18 160 lb (72.6 kg)    General: Patient appears comfortable at rest. HEENT: Conjunctiva and lids normal, oropharynx clear. Neck: Supple, no elevated JVP or carotid bruits, no thyromegaly. Lungs: Clear to auscultation, nonlabored breathing at rest. Cardiac: Regular rate and rhythm, no S3 or significant  systolic murmur. Abdomen: Soft, nontender, bowel sounds present. Extremities: No pitting edema, distal pulses 2+. Skin: Warm and dry. Musculoskeletal: No kyphosis. Neuropsychiatric: Alert and oriented x3, affect grossly appropriate.  ECG: I personally reviewed the tracing from last 05/2018 which showed sinus rhythm with left atrial enlargement, R'  in lead V1 and V2.  Recent Labwork: 12/29/2017: Potassium 3.2; Sodium 134 09/13/2018: BUN 13; Creatinine, Ser 1.04; Hemoglobin 14.9; Platelets 324.0     Component Value Date/Time   CHOL 160 12/29/2017 0258   TRIG 191 (H) 12/29/2017 0258   HDL 40 (L) 12/29/2017 0258   CHOLHDL 4.0 12/29/2017 0258   VLDL 38 12/29/2017 0258   LDLCALC 82 12/29/2017 0258  February 2020: BUN 9, creatinine 0.73, potassium 4.3, AST 30, ALT 39, cholesterol 153, triglycerides 98, HDL 47, LDL 86, hemoglobin A1c 8.4%  Other Studies Reviewed Today:  Cardiac catheterization 12/29/2017:  The left ventricular systolic function is normal.  LV end diastolic pressure is mildly elevated.  The left ventricular ejection fraction is 55-65% by visual estimate.  Mid RCA lesion is 40% stenosed.  Prox Cx lesion is 30% stenosed.  Ost 1st Diag lesion is 40% stenosed.  Previously placed Mid LAD stent (unknown type) is widely patent.  1. Widely patent mid LAD stent with no restenosis. Mild to moderate nonobstructive disease affecting the  RCA and left circumflex. 2. Normal LV systolic function and mildly elevated left ventricular end-diastolic pressure.  Recommendations: I suspect noncardiac chest pain. Continue medical therapy.  Echocardiogram 12/28/2017: Study Conclusions  - Left ventricle: The cavity size was normal. Wall thickness was increased in a pattern of mild LVH. Systolic function was normal. The estimated ejection fraction was in the range of 60% to 65%. Wall motion was normal; there were no regional wall motion abnormalities. Doppler parameters are consistent with abnormal left ventricular relaxation (grade 1 diastolic dysfunction). Doppler parameters are consistent with high ventricular filling pressure. - Aortic valve: Mildly to moderately calcified annulus. Trileaflet.  Assessment and Plan:  1.  CAD with history of DES to the LAD in 2014.  Cardiac catheterization from last year showed widely patent stent site and otherwise nonobstructive disease.  She reports no active angina and plan is to continue medical therapy.  2.  Mixed hyperlipidemia.  She is tolerating Lipitor 10 mg daily with last LDL 86.  She has a history of statin intolerance.  Plan to continue current treatment for now.  3.  GERD and esophageal stricture/spasm.  She maintains regular follow-up with gastroenterology.  4.  Essential hypertension, blood pressure is well controlled today.  Keep follow-up with Dr. Quillian Quince.  Current medicines were reviewed with the patient today.  Disposition: Follow-up in 6 months.  Signed, [name], MD, [facility] 09/30/2018 1:34 PM    [facility] [place], [place], [place] [address] Phone: [phone]; Fax: [phone]

## 2018-10-04 DIAGNOSIS — R69 Illness, unspecified: Secondary | ICD-10-CM | POA: Diagnosis not present

## 2018-10-10 ENCOUNTER — Other Ambulatory Visit: Payer: Self-pay | Admitting: Cardiology

## 2018-11-13 ENCOUNTER — Other Ambulatory Visit: Payer: Self-pay | Admitting: Cardiology

## 2018-12-22 DIAGNOSIS — E782 Mixed hyperlipidemia: Secondary | ICD-10-CM | POA: Diagnosis not present

## 2018-12-22 DIAGNOSIS — E1165 Type 2 diabetes mellitus with hyperglycemia: Secondary | ICD-10-CM | POA: Diagnosis not present

## 2018-12-22 DIAGNOSIS — R5383 Other fatigue: Secondary | ICD-10-CM | POA: Diagnosis not present

## 2018-12-22 DIAGNOSIS — I10 Essential (primary) hypertension: Secondary | ICD-10-CM | POA: Diagnosis not present

## 2018-12-22 DIAGNOSIS — E119 Type 2 diabetes mellitus without complications: Secondary | ICD-10-CM | POA: Diagnosis not present

## 2018-12-22 DIAGNOSIS — E1151 Type 2 diabetes mellitus with diabetic peripheral angiopathy without gangrene: Secondary | ICD-10-CM | POA: Diagnosis not present

## 2018-12-22 DIAGNOSIS — K210 Gastro-esophageal reflux disease with esophagitis: Secondary | ICD-10-CM | POA: Diagnosis not present

## 2018-12-29 DIAGNOSIS — Z6829 Body mass index (BMI) 29.0-29.9, adult: Secondary | ICD-10-CM | POA: Diagnosis not present

## 2018-12-29 DIAGNOSIS — I6523 Occlusion and stenosis of bilateral carotid arteries: Secondary | ICD-10-CM | POA: Diagnosis not present

## 2018-12-29 DIAGNOSIS — E1151 Type 2 diabetes mellitus with diabetic peripheral angiopathy without gangrene: Secondary | ICD-10-CM | POA: Diagnosis not present

## 2018-12-29 DIAGNOSIS — I2510 Atherosclerotic heart disease of native coronary artery without angina pectoris: Secondary | ICD-10-CM | POA: Diagnosis not present

## 2018-12-29 DIAGNOSIS — G252 Other specified forms of tremor: Secondary | ICD-10-CM | POA: Diagnosis not present

## 2018-12-29 DIAGNOSIS — R69 Illness, unspecified: Secondary | ICD-10-CM | POA: Diagnosis not present

## 2018-12-29 DIAGNOSIS — E1165 Type 2 diabetes mellitus with hyperglycemia: Secondary | ICD-10-CM | POA: Diagnosis not present

## 2018-12-29 DIAGNOSIS — I10 Essential (primary) hypertension: Secondary | ICD-10-CM | POA: Diagnosis not present

## 2019-01-03 ENCOUNTER — Other Ambulatory Visit: Payer: Self-pay | Admitting: Cardiology

## 2019-01-18 DIAGNOSIS — R69 Illness, unspecified: Secondary | ICD-10-CM | POA: Diagnosis not present

## 2019-02-02 ENCOUNTER — Other Ambulatory Visit: Payer: Self-pay | Admitting: Cardiology

## 2019-04-02 ENCOUNTER — Other Ambulatory Visit: Payer: Self-pay | Admitting: Cardiology

## 2019-04-16 ENCOUNTER — Telehealth: Payer: Self-pay | Admitting: Physician Assistant

## 2019-04-16 NOTE — Telephone Encounter (Signed)
04/16/2019 1230  Patient called our telephone service with complaints of stomachache and constipation, she also had an episode of vomiting bright red blood this morning. No dizziness or syncope, incident sounds isolated.  After talking with the patient it sounds as though she is having increasing abdominal pain which is not completely abnormal for her, but is slightly increased from baseline.  She also had an episode of nausea and vomiting this morning and saw some bright red blood in her sputum.  It sounds as though it is a trace amount, but patient tells me she has never seen blood before and this scared her.  Recommend the patient proceed to the ER.  She asked if she can go to [facility].  I told her she could get evaluated there but then they would likely have to transfer her if she needed GI care.  If she wanted to avoid possible transfer she could go to [facility] or [facility].  If patient arrives in ER and our service is needed please consult [place].  Ellouise Newer, PA-C

## 2019-04-17 ENCOUNTER — Telehealth: Payer: Self-pay | Admitting: Gastroenterology

## 2019-04-17 NOTE — Telephone Encounter (Signed)
FYI- Spoke with the patient who reported to this RN everything that was in [place] telephone note on 9/27. She was calling to "see what is wrong with me." The patient reported she chose to not go to the hospital although she had been advised to because she was nervous about driving herself so far alone and reported her husband would not take her. The patient was calling to see if she could be seen in the office, Ellouise Newer had sooner availability. The patient has been scheduled on 9/30 at 10:30 am.

## 2019-04-18 NOTE — Telephone Encounter (Signed)
Brittani, Thank you for the update.  I have reviewed JL's note and this one after being out of the office yesterday.  Thanks for arranging the OV with JL tomorrow.  Patient has had difficulty with constipation for over 6 months now, sounds like worsening.   Anderson Malta,  I will be at the hospital and [facility] tomorrow - please call me when you see her.  - HD

## 2019-04-18 NOTE — Telephone Encounter (Signed)
Noted  

## 2019-04-19 ENCOUNTER — Ambulatory Visit (HOSPITAL_COMMUNITY)
Admission: RE | Admit: 2019-04-19 | Discharge: 2019-04-19 | Disposition: A | Discharge status: Home / Self Care | Payer: Medicare HMO | Source: Ambulatory Visit | Attending: Physician Assistant | Admitting: Physician Assistant

## 2019-04-19 ENCOUNTER — Encounter: Payer: Self-pay | Admitting: Physician Assistant

## 2019-04-19 ENCOUNTER — Other Ambulatory Visit (INDEPENDENT_AMBULATORY_CARE_PROVIDER_SITE_OTHER): Payer: Medicare HMO

## 2019-04-19 ENCOUNTER — Other Ambulatory Visit: Payer: Self-pay

## 2019-04-19 ENCOUNTER — Ambulatory Visit: Payer: Medicare HMO | Admitting: Physician Assistant

## 2019-04-19 VITALS — BP 110/70 | HR 60 | Temp 98.4°F | Ht 62.0 in | Wt 160.0 lb

## 2019-04-19 DIAGNOSIS — K920 Hematemesis: Secondary | ICD-10-CM | POA: Diagnosis not present

## 2019-04-19 DIAGNOSIS — R109 Unspecified abdominal pain: Secondary | ICD-10-CM | POA: Diagnosis not present

## 2019-04-19 DIAGNOSIS — K5900 Constipation, unspecified: Secondary | ICD-10-CM

## 2019-04-19 LAB — CBC WITH DIFFERENTIAL/PLATELET
Basophils Absolute: 0.1 10*3/uL (ref 0.0–0.1)
Basophils Relative: 0.7 % (ref 0.0–3.0)
Eosinophils Absolute: 0.3 10*3/uL (ref 0.0–0.7)
Eosinophils Relative: 2.3 % (ref 0.0–5.0)
HCT: 43.7 % (ref 36.0–46.0)
Hemoglobin: 14.8 g/dL (ref 12.0–15.0)
Lymphocytes Relative: 35 % (ref 12.0–46.0)
Lymphs Abs: 3.9 10*3/uL (ref 0.7–4.0)
MCHC: 33.9 g/dL (ref 30.0–36.0)
MCV: 98 fl (ref 78.0–100.0)
Monocytes Absolute: 0.9 10*3/uL (ref 0.1–1.0)
Monocytes Relative: 8.1 % (ref 3.0–12.0)
Neutro Abs: 6.1 10*3/uL (ref 1.4–7.7)
Neutrophils Relative %: 53.9 % (ref 43.0–77.0)
Platelets: 290 10*3/uL (ref 150.0–400.0)
RBC: 4.45 Mil/uL (ref 3.87–5.11)
RDW: 12.5 % (ref 11.5–15.5)
WBC: 11.3 10*3/uL — ABNORMAL HIGH (ref 4.0–10.5)

## 2019-04-19 LAB — COMPREHENSIVE METABOLIC PANEL
ALT: 32 U/L (ref 0–35)
AST: 27 U/L (ref 0–37)
Albumin: 4.3 g/dL (ref 3.5–5.2)
Alkaline Phosphatase: 93 U/L (ref 39–117)
BUN: 7 mg/dL (ref 6–23)
CO2: 34 mEq/L — ABNORMAL HIGH (ref 19–32)
Calcium: 10.3 mg/dL (ref 8.4–10.5)
Chloride: 100 mEq/L (ref 96–112)
Creatinine, Ser: 0.78 mg/dL (ref 0.40–1.20)
GFR: 72.89 mL/min (ref >60.00)
Glucose, Bld: 113 mg/dL — ABNORMAL HIGH (ref 70–99)
Potassium: 4 mEq/L (ref 3.5–5.1)
Sodium: 140 mEq/L (ref 135–145)
Total Bilirubin: 0.6 mg/dL (ref 0.2–1.2)
Total Protein: 7.8 g/dL (ref 6.0–8.3)

## 2019-04-19 MED ORDER — SODIUM CHLORIDE (PF) 0.9 % IJ SOLN
INTRAMUSCULAR | Status: AC
  Filled 2019-04-19: qty 50

## 2019-04-19 MED ORDER — IOHEXOL 300 MG/ML  SOLN
100.0000 mL | Freq: Once | INTRAMUSCULAR | Status: AC | PRN
  Administered 2019-04-19: 100 mL via INTRAVENOUS

## 2019-04-19 NOTE — Progress Notes (Signed)
____________________________________________________________  Attending physician addendum:  Thank you for sending this case to me and for calling me when she was in clinic today (while I was at the hospital seeing consults) I have reviewed the entire note, and that is the plan we discussed.  Concerning for diverticulitis or perhaps obstruction. Lab work from today reassuring with only mild elevation of bicarb, probably from vomiting, normal potassium and renal function.  Hemoglobin normal, which is reassuring with her description of blood-streaked vomitus.  WBC only mildly increased at 11.3.  Please follow-up on results of CT scan later today.  Wilfrid Lund, MD  ____________________________________________________________

## 2019-04-19 NOTE — Patient Instructions (Signed)
Your provider has requested that you go to the basement level for lab work before leaving today. Press "B" on the elevator. The lab is located at the first door on the left as you exit the elevator.  You have been scheduled for a CT scan of the abdomen and pelvis at [facility] are scheduled on 04/19/19 at 3:00pm. You should arrive 15 minutes prior to your appointment time for registration. Please follow the written instructions below on the day of your exam:  WARNING: IF YOU ARE ALLERGIC TO IODINE/X-RAY DYE, PLEASE NOTIFY RADIOLOGY IMMEDIATELY AT [phone]! YOU WILL BE GIVEN A 13 HOUR PREMEDICATION PREP.  1) Do not eat or drink anything after 11:00am (4 hours prior to your test) 2) You have been given 2 bottles of oral contrast to drink. The solution may taste better if refrigerated, but do NOT add ice or any other liquid to this solution. Shake well before drinking.    Drink 1 bottle of contrast @ 1:00pm (2 hours prior to your exam)  Drink 1 bottle of contrast @ 2:00pm (1 hour prior to your exam)  You may take any medications as prescribed with a small amount of water, if necessary. If you take any of the following medications: METFORMIN, GLUCOPHAGE, GLUCOVANCE, AVANDAMET, RIOMET, FORTAMET, [place] MET, JANUMET, GLUMETZA or METAGLIP, you MAY be asked to HOLD this medication 48 hours AFTER the exam.  The purpose of you drinking the oral contrast is to aid in the visualization of your intestinal tract. The contrast solution may cause some diarrhea. Depending on your individual set of symptoms, you may also receive an intravenous injection of x-ray contrast/dye. Plan on being at [facility] for 30 minutes or longer, depending on the type of exam you are having performed.  This test typically takes 30-45 minutes to complete.  If you have any questions regarding your exam or if you need to reschedule, you may call the CT department at [phone] between the hours of 8:00 am and 5:00 pm,  Monday-Friday.  ________________________________________________________________________  Continue using Miralax.   Thank you for choosing me and [facility].  Dennison Bulla

## 2019-04-19 NOTE — Progress Notes (Signed)
Chief Complaint: Hematemesis, abdominal pain  HPI:    [name] is a 68 year old female with a past medical history as listed below, known to Dr. [name] for abdominal pain and constipation, who presents to clinic today with a complaint of an episode of hematemesis and abdominal pain.    02/08/2018 EGD with widely patent Schatzki's ring, small hiatal hernia and otherwise normal.     02/16/2018 esophageal manometry with distal esophageal spasm.    09/13/2018 patient was seen for abdominal pain and constipation.  At that time she had a CT abdomen pelvis with oral and IV contrast.  She is also given some MiraLAX to take.  It was thought that she was having a severe episode of acute constipation in the setting of known severe diverticulosis, though could be acute diverticulitis.    09/14/2018 CT showed increased hepatomegaly and geographic pattern of steatosis since 2015 exam, sigmoid diverticulosis and chronic muscular hypertrophy with no radiographic evidence of diverticulitis and stable 3 cm periampullary duodenal diverticulum.    04/17/2019 patient called on-call service and describe throwing up with some bright red blood as well as nausea and stomach pain.    Today, the patient tells me she was having a bowel movement on Sunday and when she has bowel movements, she has "terrible cramps".  Started with nausea and vomited multiple times and on the third or fourth episode of vomiting saw small amount of bright red blood streaking her vomitus.  She then continued with generalized abdominal pain which is worse in her epigastrium and right upper quadrant per her.  This seems to come and go in severity over the past 3 days.  Has been unable to eat anything solid or she will vomit again.  Describes trying rice and chicken noodle soup which she vomited back up.  Was able to keep down a banana yesterday.  Has seen no further blood in her vomitus.  Does continue with this 8/10 abdominal pain which "feels a lot different  than my normal IBS".  Does describe having 2 solid full regular bowel movements this morning after restarting her MiraLAX.  Tells me she is supposed to take it twice a day but "I do not do that all the time".  Describes a history of pancreatitis and wonders if this could be the same.    Also describes a symptom of eating and feeling very cold afterwards, "freezing enough that I have to get a jacket", this happens almost every time she eats but has done so for years.    Denies fever, chills or weight loss.  Past Medical History:  Diagnosis Date   Anxiety    Arthritis    Asthma    Barrett esophagus    Chronic bronchitis (HCC)    Chronic diarrhea    Chronic lower back pain    Coronary atherosclerosis of native coronary artery    DES to LAD May 2014   Diverticulosis    Essential hypertension    Fatty liver    Fibromyalgia    GERD (gastroesophageal reflux disease)    H/O hiatal hernia    IBS (irritable bowel syndrome)    Major depression in partial remission ([place])    Migraines    Mixed hyperlipidemia    OSA on CPAP    Pancreatitis 2008   Pneumonia 1990's   Sleep apnea    Type 2 diabetes mellitus ([place])     Past Surgical History:  Procedure Laterality Date   25 HOUR PH STUDY  N/A 02/16/2018   Procedure: 24 HOUR PH STUDY;  Surgeon: Mauri Pole, MD;  Location: [facility];  Service: Endoscopy;  Laterality: N/A;   ABDOMINAL HYSTERECTOMY  ?1987   APPENDECTOMY  ?1987   BUNIONECTOMY Bilateral 1972   CARPAL TUNNEL RELEASE Bilateral ?1990   CATARACT EXTRACTION W/ INTRAOCULAR LENS  IMPLANT, BILATERAL Bilateral ?2005   CESAREAN SECTION  1985   CHOLECYSTECTOMY  1990'S   COLONOSCOPY  10/13   Dr. Britta Mccreedy - tubular adenomas and diverticulosis   DILATION AND CURETTAGE OF UTERUS  1970's   "probably 3" (12/09/2012)   ERCP  2008   ESOPHAGEAL DILATION  ` 2012   ESOPHAGEAL MANOMETRY N/A 10/21/2015   Procedure: ESOPHAGEAL MANOMETRY (EM);  Surgeon: Doran Stabler, MD;  Location: [facility];  Service: Gastroenterology;  Laterality: N/A;   ESOPHAGEAL MANOMETRY N/A 02/16/2018   Procedure: ESOPHAGEAL MANOMETRY (EM);  Surgeon: Mauri Pole, MD;  Location: [facility];  Service: Endoscopy;  Laterality: N/A;   HUMERUS FRACTURE SURGERY Right 07/19/1969   "horst threw me" (12/09/2012)   LEFT HEART CATH AND CORONARY ANGIOGRAPHY N/A 12/29/2017   Procedure: LEFT HEART CATH AND CORONARY ANGIOGRAPHY;  Surgeon: Wellington Hampshire, MD;  Location: [facility];  Service: Cardiovascular;  Laterality: N/A;   NASAL SEPTUM SURGERY  1970's   PERCUTANEOUS CORONARY STENT INTERVENTION (PCI-S) N/A 12/09/2012   Procedure: PERCUTANEOUS CORONARY STENT INTERVENTION (PCI-S);  Surgeon: Burnell Blanks, MD;  Location: [facility] CATH [facility];  Service: Cardiovascular;  Laterality: N/A;   [place] IMPEDANCE STUDY N/A 02/16/2018   Procedure: [place] IMPEDANCE STUDY;  Surgeon: Mauri Pole, MD;  Location: [facility];  Service: Endoscopy;  Laterality: N/A;   TOE FUSION Right 10/11   First MTP joint   TOE SURGERY Right 1990's   "toe next to big toe:  dr said I had tumor; cut bone & stuff; another OR dr stretched tendons, etc" (12/09/2012)   TUBAL LIGATION  1985    Current Outpatient Medications  Medication Sig Dispense Refill   albuterol (PROVENTIL HFA;VENTOLIN HFA) 108 (90 Base) MCG/ACT inhaler Inhale 2 puffs into the lungs every 4 (four) hours as needed.  3   ALPRAZolam (XANAX) 1 MG tablet TAKE 1 TABLET BY MOUTH AT BEDTIME AS NEEDED FOR ANXIETY  1   amLODipine (NORVASC) 5 MG tablet TAKE 1 TABLET BY MOUTH DAILY 90 tablet 1   aspirin EC 81 MG tablet Take 81 mg by mouth daily.     atorvastatin (LIPITOR) 10 MG tablet TAKE 1 TABLET BY MOUTH DAILY 90 tablet 1   DULoxetine (CYMBALTA) 60 MG capsule Take 60 mg by mouth daily.      hydrochlorothiazide (HYDRODIURIL) 25 MG tablet Take 25 mg by mouth daily.     HYDROcodone-acetaminophen (NORCO/VICODIN) 5-325 MG  tablet 1 tablet 2 (two) times daily.   0   isosorbide mononitrate (IMDUR) 30 MG 24 hr tablet TAKE 1/2 TABLET BY MOUTH DAILY 45 tablet 1   loratadine (CLARITIN) 10 MG tablet Take 10 mg by mouth daily.      losartan (COZAAR) 100 MG tablet Take 100 mg by mouth daily.     metoprolol tartrate (LOPRESSOR) 50 MG tablet TAKE 1 TABLET BY MOUTH TWICE DAILY 180 tablet 0   montelukast (SINGULAIR) 10 MG tablet Take 10 mg by mouth at bedtime.      nitroGLYCERIN (NITROSTAT) 0.4 MG SL tablet Place 0.4 mg under the tongue every 5 (five) minutes as needed for chest pain.     promethazine (  PHENERGAN) 25 MG tablet Take 25 mg by mouth at bedtime.      traZODone (DESYREL) 50 MG tablet Take 200 mg by mouth at bedtime. 4 tabs hs     TRESIBA FLEXTOUCH 200 UNIT/ML SOPN Inject 100 Units into the skin 2 (two) times daily as needed. As directed     No current facility-administered medications for this visit.     Allergies as of 04/19/2019 - Review Complete 09/30/2018  Allergen Reaction Noted   Erythromycin base Diarrhea 12/09/2012   Azithromycin Other (See Comments) 04/13/2013   Other  01/01/2015   Pollen extract  01/01/2015   Remeron [mirtazapine] Other (See Comments) 11/05/2010   Tree extract  01/01/2015   Zolpidem tartrate Other (See Comments)    Sulfonamide derivatives Rash     Family History  Problem Relation Age of Onset   Stroke Mother    Cirrhosis Father    Hypertension Father    Esophageal cancer Sister    Colon polyps Sister    Prostate cancer Brother    Colon cancer Neg Hx    Rectal cancer Neg Hx    Stomach cancer Neg Hx     Social History   Socioeconomic History   Marital status: Married    Spouse name: [name]   Number of children: 2   Years of education: Not on file   Highest education level: Not on file  Occupational History   Occupation: Retired  [employer]  Social Designer, fashion/clothing strain: Not on file   Food insecurity    Worry: Not on file     Inability: Not on Lexicographer needs    Medical: Not on file    Non-medical: Not on file  Tobacco Use   Smoking status: Former Smoker    Packs/day: 0.80    Years: 15.00    Pack years: 12.00    Types: Cigarettes    Start date: 07/20/1976    Quit date: 07/20/1993    Years since quitting: 25.7   Smokeless tobacco: Never Used  Substance and Sexual Activity   Alcohol use: No    Alcohol/week: 0.0 standard drinks   Drug use: No   Sexual activity: Yes  Lifestyle   Physical activity    Days per week: Not on file    Minutes per session: Not on file   Stress: Not on file  Relationships   Social connections    Talks on phone: Not on file    Gets together: Not on file    Attends religious service: Not on file    Active member of club or organization: Not on file    Attends meetings of clubs or organizations: Not on file    Relationship status: Not on file   Intimate partner violence    Fear of current or ex partner: Not on file    Emotionally abused: Not on file    Physically abused: Not on file    Forced sexual activity: Not on file  Other Topics Concern   Not on file  Social History Narrative   Not on file    Review of Systems:    Constitutional: No weight loss, fever or chills Cardiovascular: No chest pain  Respiratory: No SOB  Gastrointestinal: See HPI and otherwise negative   Physical Exam:  Vital signs: BP 110/70    Pulse 60    Temp 98.4 F (36.9 C)    Ht 5\' 2"  (1.575 m)    Wt 160 lb (  72.6 kg)    BMI 29.26 kg/m   Constitutional:   Pleasant Caucasian female appears to be in NAD, Well developed, Well nourished, alert and cooperative Respiratory: Respirations even and unlabored. Lungs clear to auscultation bilaterally.   No wheezes, crackles, or rhonchi.  Cardiovascular: Normal S1, S2. No MRG. Regular rate and rhythm. No peripheral edema, cyanosis or pallor.  Gastrointestinal: Soft, Marked ttp in the epigastrum and RUQ with involuntary guarding even to  light palpation, decreased BS all 4 quadrants, hypertympanic over epigastrum. No appreciable masses or hepatomegaly. Rectal:  Not performed.  Psychiatric: Demonstrates good judgement and reason without abnormal affect or behaviors.  No recent labs.  Assessment: 1.  Abdominal pain: Severe, very tender to palpation today, different than patient's chronic pain; concern for abdominal pathology 2.  Hematemesis: 1 episode of bright red blood streaking the vomitus after the third or fourth time patient vomited, has vomited since then see no further blood, likely Mallory-Weiss 3.  Constipation: Chronic for the patient, question whether or not patient was severely constipated and possibly had some ischemia with nausea and vomiting  Plan: 1.  Ordered stat CBC and CMP 2.  Ordered stat CT abdomen pelvis with contrast 3.  Would recommend the patient use her MiraLAX twice a day from now on 4.  We will call patient with further recommendations after labs above.  Did discuss case with Dr. [name] at time of patient's visit.  Ellouise Newer, PA-C [facility] 04/19/2019, 10:23 AM  Cc: Caryl Bis, MD

## 2019-04-20 ENCOUNTER — Other Ambulatory Visit: Payer: Self-pay

## 2019-04-20 MED ORDER — CIPROFLOXACIN HCL 500 MG PO TABS: 500.0000 mg | ORAL_TABLET | Freq: Two times a day (BID) | ORAL | 0 refills | Status: DC

## 2019-04-20 MED ORDER — METRONIDAZOLE 500 MG PO TABS: 500.0000 mg | ORAL_TABLET | Freq: Three times a day (TID) | ORAL | 0 refills | Status: DC

## 2019-04-20 MED ORDER — OMEPRAZOLE 40 MG PO CPDR: 40.0000 mg | DELAYED_RELEASE_CAPSULE | Freq: Every day | ORAL | 3 refills | Status: DC

## 2019-05-05 ENCOUNTER — Telehealth: Payer: Self-pay | Admitting: Cardiology

## 2019-05-05 NOTE — Telephone Encounter (Signed)
Virtual Visit Pre-Appointment Phone Call  "(Name), I am calling you today to discuss your upcoming appointment. We are currently trying to limit exposure to the virus that causes COVID-19 by seeing patients at home rather than in the office."  1. "What is the BEST phone number to call the day of the visit?" - include this in appointment notes  2. Do you have or have access to (through a family member/friend) a smartphone with video capability that we can use for your visit?" a. If yes - list this number in appt notes as cell (if different from BEST phone #) and list the appointment type as a VIDEO visit in appointment notes b. If no - list the appointment type as a PHONE visit in appointment notes  Confirm consent - "In the setting of the current Covid19 crisis, you are scheduled for a (phone or video) visit with your provider on (date) at (time).  Just as we do with many in-office visits, in order for you to participate in this visit, we must obtain consent.  If you'd like, I can send this to your mychart (if signed up) or email for you to review.  Otherwise, I can obtain your verbal consent now.  All virtual visits are billed to your insurance company just like a normal visit would be.  By agreeing to a virtual visit, we'd like you to understand that the technology does not allow for your provider to perform an examination, and thus may limit your provider's ability to fully assess your condition. If your provider identifies any concerns that need to be evaluated in person, we will make arrangements to do so.  Finally, though the technology is pretty good, we cannot assure that it will always work on either your or our end, and in the setting of a video visit, we may have to convert it to a phone-only visit.  In either situation, we cannot ensure that we have a secure connection.  Are you willing to proceed?" STAFF: Did the patient verbally acknowledge consent to telehealth visit? Document  YES/NO here: yes 3. Advise patient to be prepared - "Two hours prior to your appointment, go ahead and check your blood pressure, pulse, oxygen saturation, and your weight (if you have the equipment to check those) and write them all down. When your visit starts, your provider will ask you for this information. If you have an Apple Watch or Kardia device, please plan to have heart rate information ready on the day of your appointment. Please have a pen and paper handy nearby the day of the visit as well."  4. Give patient instructions for MyChart download to smartphone OR [email or web address] as below if video visit (depending on what platform provider is using)  5. Inform patient they will receive a phone call 15 minutes prior to their appointment time (may be from unknown caller ID) so they should be prepared to answer    TELEPHONE CALL NOTE  [name] has been deemed a candidate for a follow-up tele-health visit to limit community exposure during the Covid-19 pandemic. I spoke with the patient via phone to ensure availability of phone/video source, confirm preferred email & phone number, and discuss instructions and expectations.  I reminded [name] to be prepared with any vital sign and/or heart rhythm information that could potentially be obtained via home monitoring, at the time of her visit. I reminded [name] to expect a phone call prior to her visit.  [name] 05/05/2019 2:15 PM   INSTRUCTIONS FOR DOWNLOADING THE MYCHART APP TO SMARTPHONE  - The patient must first make sure to have activated MyChart and know their login information - If Apple, go to [employer] and type in MyChart in the search bar and download the app. If Android, ask patient to go to [employer] and type in [place] in the search bar and download the app. The app is free but as with any other app downloads, their phone may require them to verify saved payment information or Apple/Android  password.  - The patient will need to then log into the app with their MyChart username and password, and select [facility] as their healthcare provider to link the account. When it is time for your visit, go to the MyChart app, find appointments, and click Begin Video Visit. Be sure to Select Allow for your device to access the Microphone and Camera for your visit. You will then be connected, and your provider will be with you shortly.  **If they have any issues connecting, or need assistance please contact MyChart service desk [phone])**  **If using a computer, in order to ensure the best quality for their visit they will need to use either of the following Internet Browsers: [employer], or Google Chrome**  IF USING DOXIMITY or DOXY.ME - The patient will receive a link just prior to their visit by text.     FULL LENGTH CONSENT FOR TELE-HEALTH VISIT   I hereby voluntarily request, consent and authorize [facility] and its employed or contracted physicians, physician assistants, nurse practitioners or other licensed health care professionals (the Practitioner), to provide me with telemedicine health care services (the Services") as deemed necessary by the treating Practitioner. I acknowledge and consent to receive the Services by the Practitioner via telemedicine. I understand that the telemedicine visit will involve communicating with the Practitioner through live audiovisual communication technology and the disclosure of certain medical information by electronic transmission. I acknowledge that I have been given the opportunity to request an in-person assessment or other available alternative prior to the telemedicine visit and am voluntarily participating in the telemedicine visit.  I understand that I have the right to withhold or withdraw my consent to the use of telemedicine in the course of my care at any time, without affecting my right to future care or treatment,  and that the Practitioner or I may terminate the telemedicine visit at any time. I understand that I have the right to inspect all information obtained and/or recorded in the course of the telemedicine visit and may receive copies of available information for a reasonable fee.  I understand that some of the potential risks of receiving the Services via telemedicine include:   Delay or interruption in medical evaluation due to technological equipment failure or disruption;  Information transmitted may not be sufficient (e.g. poor resolution of images) to allow for appropriate medical decision making by the Practitioner; and/or   In rare instances, security protocols could fail, causing a breach of personal health information.  Furthermore, I acknowledge that it is my responsibility to provide information about my medical history, conditions and care that is complete and accurate to the best of my ability. I acknowledge that Practitioner's advice, recommendations, and/or decision may be based on factors not within their control, such as incomplete or inaccurate data provided by me or distortions of diagnostic images or specimens that may result from electronic transmissions. I understand that the  practice of medicine is not an Chief Strategy Officer and that Practitioner makes no warranties or guarantees regarding treatment outcomes. I acknowledge that I will receive a copy of this consent concurrently upon execution via email to the email address I last provided but may also request a printed copy by calling the office of [facility].    I understand that my insurance will be billed for this visit.   I have read or had this consent read to me.  I understand the contents of this consent, which adequately explains the benefits and risks of the Services being provided via telemedicine.   I have been provided ample opportunity to ask questions regarding this consent and the Services and have had my questions  answered to my satisfaction.  I give my informed consent for the services to be provided through the use of telemedicine in my medical care  By participating in this telemedicine visit I agree to the above.

## 2019-05-11 ENCOUNTER — Ambulatory Visit: Payer: Medicare HMO | Admitting: Physician Assistant

## 2019-05-11 ENCOUNTER — Encounter: Payer: Self-pay | Admitting: Physician Assistant

## 2019-05-11 ENCOUNTER — Other Ambulatory Visit: Payer: Self-pay

## 2019-05-11 VITALS — BP 100/70 | HR 52 | Temp 97.2°F | Ht 62.0 in | Wt 160.0 lb

## 2019-05-11 DIAGNOSIS — R1084 Generalized abdominal pain: Secondary | ICD-10-CM

## 2019-05-11 DIAGNOSIS — R194 Change in bowel habit: Secondary | ICD-10-CM

## 2019-05-11 DIAGNOSIS — R1310 Dysphagia, unspecified: Secondary | ICD-10-CM | POA: Diagnosis not present

## 2019-05-11 MED ORDER — DICYCLOMINE HCL 10 MG PO CAPS: 10.0000 mg | ORAL_CAPSULE | Freq: Two times a day (BID) | ORAL | 2 refills | Status: DC

## 2019-05-11 NOTE — Progress Notes (Signed)
Chief Complaint: Follow-up abdominal pain  HPI:    [name] is a 68 year old female with a past medical history as listed below, known to Dr. [name], who returns to clinic today for follow-up of her abdominal pain.    02/08/2018 EGD with widely patent Schatzki's ring, small hiatal hernia and otherwise normal.  02/16/2018 esophageal manometry with distal esophageal spasm.    04/19/2019 patient seen in clinic by me.  At that time described terrible abdominal cramping with nausea and vomiting.  At that time ordered stat CT abdomen pelvis as well as CBC and CMP.  She was instructed to use her MiraLAX twice a day from now on.  CBC showed a minimally elevated white count 11.3.  CT abdomen pelvis showed suspected mild acute diverticulitis proximally in the sigmoid colon, mild distal esophageal wall thickening potentially from esophagitis with small a pill varices adjacent to the esophagus.  At that time patient was started on Cipro and Flagyl for 10 days as well as omeprazole 40 mg daily.    Today, the patient describes that she is 65% better after finishing antibiotics about a week ago.  She still has some "random abdominal cramps", which she can feel in different places of her abdomen at different times.  Also continues with stools which are unreliable, sometimes has looser stools than others.  She is currently taking her MiraLAX twice a day and feels as though this helps some.  Also continues with nausea occasionally and uses Phenergan which is prescribed by her PCP which helps.  Does tell me that ever since finishing the antibiotic she has not had the feeling of getting really cold after eating and wonders if it was related.    Today her largest concern is of food and pills getting stuck in her throat.  Tells me they get stuck and then she will regurgitate them.  This can happen 2 days out of the week at most.    Patient asked today when she is due for her next colonoscopy.  Her last was in 2016 and normal,  repeat was recommended in 10 years.    Denies fever, chills, weight loss, anorexia or symptoms that awaken her from sleep.     Past Medical History:  Diagnosis Date  . Anxiety   . Arthritis   . Asthma   . Barrett esophagus   . Chronic bronchitis ([place])   . Chronic diarrhea   . Chronic lower back pain   . Coronary atherosclerosis of native coronary artery    DES to LAD May 2014  . Diverticulosis   . Essential hypertension   . Fatty liver   . Fibromyalgia   . GERD (gastroesophageal reflux disease)   . H/O hiatal hernia   . IBS (irritable bowel syndrome)   . Major depression in partial remission ([place])   . Migraines   . Mixed hyperlipidemia   . OSA on CPAP   . Pancreatitis 2008  . Pneumonia 1990's  . Sleep apnea   . Type 2 diabetes mellitus ([place])     Past Surgical History:  Procedure Laterality Date  . [place] STUDY N/A 02/16/2018   Procedure: [place] STUDY;  Surgeon: Mauri Pole, MD;  Location: [facility];  Service: Endoscopy;  Laterality: N/A;  . ABDOMINAL HYSTERECTOMY  ?1987  . APPENDECTOMY  ?1987  . BUNIONECTOMY Bilateral 1972  . CARPAL TUNNEL RELEASE Bilateral ?1990  . CATARACT EXTRACTION W/ INTRAOCULAR LENS  IMPLANT, BILATERAL Bilateral ?2005  .  CESAREAN SECTION  1985  . CHOLECYSTECTOMY  1990'S  . COLONOSCOPY  10/13   Dr. Britta Mccreedy - tubular adenomas and diverticulosis  . DILATION AND CURETTAGE OF UTERUS  1970's   "probably 3" (12/09/2012)  . ERCP  2008  . ESOPHAGEAL DILATION  ` 2012  . ESOPHAGEAL MANOMETRY N/A 10/21/2015   Procedure: ESOPHAGEAL MANOMETRY (EM);  Surgeon: Doran Stabler, MD;  Location: [facility];  Service: Gastroenterology;  Laterality: N/A;  . ESOPHAGEAL MANOMETRY N/A 02/16/2018   Procedure: ESOPHAGEAL MANOMETRY (EM);  Surgeon: Mauri Pole, MD;  Location: [facility];  Service: Endoscopy;  Laterality: N/A;  . HUMERUS FRACTURE SURGERY Right 07/19/1969   "horst threw me" (12/09/2012)  . LEFT HEART CATH AND CORONARY  ANGIOGRAPHY N/A 12/29/2017   Procedure: LEFT HEART CATH AND CORONARY ANGIOGRAPHY;  Surgeon: Wellington Hampshire, MD;  Location: [facility];  Service: Cardiovascular;  Laterality: N/A;  . NASAL SEPTUM SURGERY  1970's  . PERCUTANEOUS CORONARY STENT INTERVENTION (PCI-S) N/A 12/09/2012   Procedure: PERCUTANEOUS CORONARY STENT INTERVENTION (PCI-S);  Surgeon: Burnell Blanks, MD;  Location: [facility] CATH LAB;  Service: Cardiovascular;  Laterality: N/A;  . [place] IMPEDANCE STUDY N/A 02/16/2018   Procedure: [place] IMPEDANCE STUDY;  Surgeon: Mauri Pole, MD;  Location: [facility];  Service: Endoscopy;  Laterality: N/A;  . TOE FUSION Right 10/11   First MTP joint  . TOE SURGERY Right 1990's   "toe next to big toe:  dr said I had tumor; cut bone & stuff; another OR dr stretched tendons, etc" (12/09/2012)  . TUBAL LIGATION  1985    Current Outpatient Medications  Medication Sig Dispense Refill  . albuterol (PROVENTIL HFA;VENTOLIN HFA) 108 (90 Base) MCG/ACT inhaler Inhale 2 puffs into the lungs every 4 (four) hours as needed.  3  . ALPRAZolam (XANAX) 1 MG tablet TAKE 1 TABLET BY MOUTH AT BEDTIME AS NEEDED FOR ANXIETY  1  . amLODipine (NORVASC) 5 MG tablet TAKE 1 TABLET BY MOUTH DAILY 90 tablet 1  . aspirin EC 81 MG tablet Take 81 mg by mouth daily.    Marland Kitchen atorvastatin (LIPITOR) 10 MG tablet TAKE 1 TABLET BY MOUTH DAILY 90 tablet 1  . cetirizine (ZYRTEC) 10 MG tablet Take 10 mg by mouth daily.    . ciprofloxacin (CIPRO) 500 MG tablet Take 1 tablet (500 mg total) by mouth 2 (two) times daily. 20 tablet 0  . DULoxetine (CYMBALTA) 60 MG capsule Take 60 mg by mouth daily.     Marland Kitchen HYDROcodone-acetaminophen (NORCO/VICODIN) 5-325 MG tablet 1 tablet 2 (two) times daily.   0  . isosorbide mononitrate (IMDUR) 30 MG 24 hr tablet TAKE 1/2 TABLET BY MOUTH DAILY 45 tablet 1  . losartan-hydrochlorothiazide (HYZAAR) 100-25 MG tablet Take 1 tablet by mouth daily.    . metoprolol tartrate (LOPRESSOR) 50 MG tablet TAKE 1  TABLET BY MOUTH TWICE DAILY 180 tablet 0  . metroNIDAZOLE (FLAGYL) 500 MG tablet Take 1 tablet (500 mg total) by mouth 3 (three) times daily. 30 tablet 0  . montelukast (SINGULAIR) 10 MG tablet Take 10 mg by mouth at bedtime.     . nitroGLYCERIN (NITROSTAT) 0.4 MG SL tablet Place 0.4 mg under the tongue every 5 (five) minutes as needed for chest pain.    Marland Kitchen omeprazole (PRILOSEC) 40 MG capsule Take 1 capsule (40 mg total) by mouth daily. 30 capsule 3  . promethazine (PHENERGAN) 25 MG tablet Take 25 mg by mouth at bedtime. Takes prn and at  times does 1/2 tablet instead of whole one    . traZODone (DESYREL) 50 MG tablet Take 200 mg by mouth at bedtime. 4 tabs hs    . TRESIBA FLEXTOUCH 200 UNIT/ML SOPN Inject 100 Units into the skin 2 (two) times daily as needed. As directed     No current facility-administered medications for this visit.     Allergies as of 05/11/2019 - Review Complete 04/19/2019  Allergen Reaction Noted  . Erythromycin base Diarrhea 12/09/2012  . Azithromycin Other (See Comments) 04/13/2013  . Other  01/01/2015  . Pollen extract  01/01/2015  . Remeron [mirtazapine] Other (See Comments) 11/05/2010  . Tree extract  01/01/2015  . Zolpidem tartrate Other (See Comments)   . Sulfonamide derivatives Rash     Family History  Problem Relation Age of Onset  . Stroke Mother   . Cirrhosis Father   . Hypertension Father   . Esophageal cancer Sister   . Colon polyps Sister        pre-cancerous, part of colon removed  . Prostate cancer Brother   . Colon cancer Neg Hx   . Rectal cancer Neg Hx   . Stomach cancer Neg Hx     Social History   Socioeconomic History  . Marital status: Married    Spouse name: Konrad Dolores  . Number of children: 2  . Years of education: Not on file  . Highest education level: Not on file  Occupational History  . Occupation: Retired  [employer]  Social Needs  . Financial resource strain: Not on file  . Food insecurity    Worry: Not on file    Inability:  Not on file  . Transportation needs    Medical: Not on file    Non-medical: Not on file  Tobacco Use  . Smoking status: Former Smoker    Packs/day: 0.80    Years: 15.00    Pack years: 12.00    Types: Cigarettes    Start date: 07/20/1976    Quit date: 07/20/1993    Years since quitting: 25.8  . Smokeless tobacco: Never Used  Substance and Sexual Activity  . Alcohol use: No    Alcohol/week: 0.0 standard drinks  . Drug use: No  . Sexual activity: Yes  Lifestyle  . Physical activity    Days per week: Not on file    Minutes per session: Not on file  . Stress: Not on file  Relationships  . Social Herbalist on phone: Not on file    Gets together: Not on file    Attends religious service: Not on file    Active member of club or organization: Not on file    Attends meetings of clubs or organizations: Not on file    Relationship status: Not on file  . Intimate partner violence    Fear of current or ex partner: Not on file    Emotionally abused: Not on file    Physically abused: Not on file    Forced sexual activity: Not on file  Other Topics Concern  . Not on file  Social History Narrative  . Not on file    Review of Systems:    Constitutional: No weight loss, fever or chills Cardiovascular: No chest pain  Respiratory: No SOB  Gastrointestinal: See HPI and otherwise negative   Physical Exam:  Vital signs: BP 100/70   Pulse (!) 52   Temp (!) 97.2 F (36.2 C)   Ht 5\' 2"  (1.575 m)  Wt 160 lb (72.6 kg)   BMI 29.26 kg/m   Constitutional:   Pleasant Caucasian female appears to be in NAD, Well developed, Well nourished, alert and cooperative Respiratory: Respirations even and unlabored. Lungs clear to auscultation bilaterally.   No wheezes, crackles, or rhonchi.  Cardiovascular: Normal S1, S2. No MRG. Regular rate and rhythm. No peripheral edema, cyanosis or pallor.  Gastrointestinal:  Soft, nondistended, moderate generalized ttp, No rebound or guarding. Normal  bowel sounds. No appreciable masses or hepatomegaly. Rectal:  Not performed.  Psychiatric: Demonstrates good judgement and reason without abnormal affect or behaviors.  No new labs or imaging.  Assessment: 1.  Abdominal pain: Continues, is somewhat chronic for the patient, likely related to IBS, it is 65% since being on antibiotics for diverticulitis 2.  Generalized abdominal cramping: With above 3.  Dysphagia: Continues per patient, EGD with Schatzki's ring which was patent in 2019; consider esophageal dysmotility versus stricture versus ring versus other  Plan: 1.  Ordered barium esophagram with tablet to evaluate dysphagia.  Patient had a recent EGD with patent Schatzki's ring. 2.  Recommend the patient continue her Omeprazole 40 mg daily 3.  Prescribed Dicyclomine 10 mg twice daily, 20 to 30 minutes before breakfast and dinner.  #60 with 5 refills 4.  Patient will follow up in clinic with Dr. [name] in 6 to 8 weeks.  She can be seen sooner than then if she has problems.  Ellouise Newer, PA-C [facility] 05/11/2019, 10:59 AM  Cc: Caryl Bis, MD

## 2019-05-11 NOTE — Progress Notes (Signed)
____________________________________________________________  Attending physician addendum:  Thank you for sending this case to me. I have reviewed the entire note, and the outlined plan seems appropriate.  Underlying IBS. 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  Depending on barium results, might need esophageal manometry.  Wilfrid Lund, MD  ____________________________________________________________

## 2019-05-11 NOTE — Patient Instructions (Addendum)
If you are age 70 or older, your body mass index should be between 23-30. Your Body mass index is 29.26 kg/m. If this is out of the aforementioned range listed, please consider follow up with your Primary Care Provider.  If you are age 45 or younger, your body mass index should be between 19-25. Your Body mass index is 29.26 kg/m. If this is out of the aformentioned range listed, please consider follow up with your Primary Care Provider.   We have sent the following medications to your pharmacy for you to pick up at your convenience: Dicyclomine 10 mg  Continue Omeprazole 40 mg daily.  You have been scheduled for a Barium Esophogram at [facility] (1st floor of the hospital) on 05/22/19 at 9:30 am. Please arrive 15 minutes prior to your appointment for registration. Make certain not to have anything to eat or drink 3 hours prior to your test. If you need to reschedule for any reason, please contact radiology at [phone] to do so. __________________________________________________________________ A barium swallow is an examination that concentrates on views of the esophagus. This tends to be a double contrast exam (barium and two liquids which, when combined, create a gas to distend the wall of the oesophagus) or single contrast (non-ionic iodine based). The study is usually tailored to your symptoms so a good history is essential. Attention is paid during the study to the form, structure and configuration of the esophagus, looking for functional disorders (such as aspiration, dysphagia, achalasia, motility and reflux) EXAMINATION You may be asked to change into a gown, depending on the type of swallow being performed. A radiologist and radiographer will perform the procedure. The radiologist will advise you of the type of contrast selected for your procedure and direct you during the exam. You will be asked to stand, sit or lie in several different positions and to hold a small amount of  fluid in your mouth before being asked to swallow while the imaging is performed .In some instances you may be asked to swallow barium coated marshmallows to assess the motility of a solid food bolus. The exam can be recorded as a digital or video fluoroscopy procedure. POST PROCEDURE It will take 1-2 days for the barium to pass through your system. To facilitate this, it is important, unless otherwise directed, to increase your fluids for the next 24-48hrs and to resume your normal diet.  This test typically takes about 30 minutes to perform. _____________________________________________________________________  We will call you with results.  Follow up with Dr. [name] on 06/21/19 at 10:40 am.  Thank you for choosing me and [facility].    Ellouise Newer, PA-C

## 2019-05-12 ENCOUNTER — Other Ambulatory Visit (HOSPITAL_COMMUNITY): Payer: Self-pay | Admitting: Family Medicine

## 2019-05-12 DIAGNOSIS — Z1231 Encounter for screening mammogram for malignant neoplasm of breast: Secondary | ICD-10-CM

## 2019-05-17 ENCOUNTER — Encounter: Payer: Self-pay | Admitting: Cardiology

## 2019-05-17 ENCOUNTER — Telehealth (INDEPENDENT_AMBULATORY_CARE_PROVIDER_SITE_OTHER): Payer: Medicare HMO | Admitting: Cardiology

## 2019-05-17 VITALS — BP 133/80 | HR 62 | Ht 62.0 in | Wt 155.0 lb

## 2019-05-17 DIAGNOSIS — E782 Mixed hyperlipidemia: Secondary | ICD-10-CM | POA: Diagnosis not present

## 2019-05-17 DIAGNOSIS — I25119 Atherosclerotic heart disease of native coronary artery with unspecified angina pectoris: Secondary | ICD-10-CM | POA: Diagnosis not present

## 2019-05-17 DIAGNOSIS — R1319 Other dysphagia: Secondary | ICD-10-CM

## 2019-05-17 DIAGNOSIS — R1310 Dysphagia, unspecified: Secondary | ICD-10-CM

## 2019-05-17 DIAGNOSIS — I10 Essential (primary) hypertension: Secondary | ICD-10-CM | POA: Diagnosis not present

## 2019-05-17 NOTE — Patient Instructions (Addendum)

## 2019-05-17 NOTE — Progress Notes (Signed)
Virtual Visit via Telephone Note   This visit type was conducted due to national recommendations for restrictions regarding the COVID-19 Pandemic (e.g. social distancing) in an effort to limit this patient's exposure and mitigate transmission in our community.  Due to her co-morbid illnesses, this patient is at least at moderate risk for complications without adequate follow up.  This format is felt to be most appropriate for this patient at this time.  The patient did not have access to video technology/had technical difficulties with video requiring transitioning to audio format only (telephone).  All issues noted in this document were discussed and addressed.  No physical exam could be performed with this format.  Please refer to the patient's chart for her  consent to telehealth for [facility]  Date:  05/17/2019   ID:  [name], DOB 1950/09/05, MRN [id number]  Patient Location: Home Provider Location: Office  PCP:  Caryl Bis, MD  Cardiologist:  Rozann Lesches, MD Electrophysiologist:  None   Evaluation Performed:  Follow-Up Visit  Chief Complaint:   Cardiac follow-up  History of Present Illness:    [name] is a 68 y.o. female last seen in March.  We spoke by phone today.  She is in the process of a GI work-up for suspected diverticulitis and also esophagitis.  She is following with gastroenterology in [place] and has additional testing planned.  She reports intermittent dysphagia and odynophagia.  I reviewed her cardiac medications which are outlined below.  She reports compliance with therapy, no obvious intolerances.  No recent nitroglycerin use.  She underwent a follow-up cardiac catheterization in June of last year which demonstrated widely patent LAD stent and no other obstructive residual disease.  Plan is to continue medical therapy.  The patient does not have symptoms concerning for COVID-19 infection (fever, chills, cough, or new shortness of breath).   She tells me that she has been social distancing and has been consistent with mask use.   Past Medical History:  Diagnosis Date  . Anxiety   . Arthritis   . Asthma   . Barrett esophagus   . Chronic bronchitis ([place])   . Chronic diarrhea   . Chronic lower back pain   . Coronary atherosclerosis of native coronary artery    DES to LAD May 2014  . Diverticulosis   . Essential hypertension   . Fatty liver   . Fibromyalgia   . GERD (gastroesophageal reflux disease)   . H/O hiatal hernia   . IBS (irritable bowel syndrome)   . Major depression in partial remission ([place])   . Migraines   . Mixed hyperlipidemia   . OSA on CPAP   . Pancreatitis 2008  . Pneumonia 1990's  . Sleep apnea   . Type 2 diabetes mellitus ([place])    Past Surgical History:  Procedure Laterality Date  . [place] STUDY N/A 02/16/2018   Procedure: [place] STUDY;  Surgeon: Mauri Pole, MD;  Location: [facility];  Service: Endoscopy;  Laterality: N/A;  . ABDOMINAL HYSTERECTOMY  ?1987  . APPENDECTOMY  ?1987  . BUNIONECTOMY Bilateral 1972  . CARPAL TUNNEL RELEASE Bilateral ?1990  . CATARACT EXTRACTION W/ INTRAOCULAR LENS  IMPLANT, BILATERAL Bilateral ?2005  . CESAREAN SECTION  1985  . CHOLECYSTECTOMY  1990'S  . COLONOSCOPY  10/13   Dr. Britta Mccreedy - tubular adenomas and diverticulosis  . DILATION AND CURETTAGE OF UTERUS  1970's   "probably 3" (12/09/2012)  . ERCP  2008  .  ESOPHAGEAL DILATION  ` 2012  . ESOPHAGEAL MANOMETRY N/A 10/21/2015   Procedure: ESOPHAGEAL MANOMETRY (EM);  Surgeon: Doran Stabler, MD;  Location: [facility];  Service: Gastroenterology;  Laterality: N/A;  . ESOPHAGEAL MANOMETRY N/A 02/16/2018   Procedure: ESOPHAGEAL MANOMETRY (EM);  Surgeon: Mauri Pole, MD;  Location: [facility];  Service: Endoscopy;  Laterality: N/A;  . HUMERUS FRACTURE SURGERY Right 07/19/1969   "horst threw me" (12/09/2012)  . LEFT HEART CATH AND CORONARY ANGIOGRAPHY N/A 12/29/2017   Procedure: LEFT  HEART CATH AND CORONARY ANGIOGRAPHY;  Surgeon: Wellington Hampshire, MD;  Location: [facility];  Service: Cardiovascular;  Laterality: N/A;  . NASAL SEPTUM SURGERY  1970's  . PERCUTANEOUS CORONARY STENT INTERVENTION (PCI-S) N/A 12/09/2012   Procedure: PERCUTANEOUS CORONARY STENT INTERVENTION (PCI-S);  Surgeon: Burnell Blanks, MD;  Location: [facility] CATH LAB;  Service: Cardiovascular;  Laterality: N/A;  . [place] IMPEDANCE STUDY N/A 02/16/2018   Procedure: [place] IMPEDANCE STUDY;  Surgeon: Mauri Pole, MD;  Location: [facility];  Service: Endoscopy;  Laterality: N/A;  . TOE FUSION Right 10/11   First MTP joint  . TOE SURGERY Right 1990's   "toe next to big toe:  dr said I had tumor; cut bone & stuff; another OR dr stretched tendons, etc" (12/09/2012)  . TUBAL LIGATION  1985     Current Meds  Medication Sig  . albuterol (PROVENTIL HFA;VENTOLIN HFA) 108 (90 Base) MCG/ACT inhaler Inhale 2 puffs into the lungs every 4 (four) hours as needed.  . ALPRAZolam (XANAX) 1 MG tablet TAKE 1 TABLET BY MOUTH AT BEDTIME AS NEEDED FOR ANXIETY  . amLODipine (NORVASC) 5 MG tablet TAKE 1 TABLET BY MOUTH DAILY  . aspirin EC 81 MG tablet Take 81 mg by mouth daily.  Marland Kitchen atorvastatin (LIPITOR) 10 MG tablet TAKE 1 TABLET BY MOUTH DAILY  . cetirizine (ZYRTEC) 10 MG tablet Take 10 mg by mouth daily.  Marland Kitchen dicyclomine (BENTYL) 10 MG capsule Take 1 capsule (10 mg total) by mouth 2 (two) times daily. Take 20-30 minutes before breakfast and dinner  . DULoxetine (CYMBALTA) 60 MG capsule Take 60 mg by mouth daily.   Marland Kitchen HYDROcodone-acetaminophen (NORCO/VICODIN) 5-325 MG tablet 1 tablet 2 (two) times daily.   . isosorbide mononitrate (IMDUR) 30 MG 24 hr tablet TAKE 1/2 TABLET BY MOUTH DAILY  . losartan-hydrochlorothiazide (HYZAAR) 100-25 MG tablet Take 1 tablet by mouth daily.  . metoprolol tartrate (LOPRESSOR) 50 MG tablet TAKE 1 TABLET BY MOUTH TWICE DAILY  . montelukast (SINGULAIR) 10 MG tablet Take 10 mg by mouth at  bedtime.   . nitroGLYCERIN (NITROSTAT) 0.4 MG SL tablet Place 0.4 mg under the tongue every 5 (five) minutes x 3 doses as needed (if no relief after 3rd dose, proceed to the ED for an evaluation or call 911).   Marland Kitchen omeprazole (PRILOSEC) 40 MG capsule Take 1 capsule (40 mg total) by mouth daily.  . promethazine (PHENERGAN) 25 MG tablet Take 25 mg by mouth at bedtime. Takes prn and at times does 1/2 tablet instead of whole one  . traZODone (DESYREL) 50 MG tablet Take 200 mg by mouth at bedtime. 4 tabs hs  . TRESIBA FLEXTOUCH 200 UNIT/ML SOPN Inject 100 Units into the skin 2 (two) times daily as needed. As directed     Allergies:   Erythromycin base, Azithromycin, Other, Pollen extract, Remeron [mirtazapine], Tree extract, Zolpidem tartrate, and Sulfonamide derivatives   Social History   Tobacco Use  . Smoking status:  Former Smoker    Packs/day: 0.80    Years: 15.00    Pack years: 12.00    Types: Cigarettes    Start date: 07/20/1976    Quit date: 07/20/1993    Years since quitting: 25.8  . Smokeless tobacco: Never Used  Substance Use Topics  . Alcohol use: No    Alcohol/week: 0.0 standard drinks  . Drug use: No     Family Hx: The patient's family history includes Cirrhosis in her father; Colon polyps in her sister; Esophageal cancer in her sister; Hypertension in her father; Prostate cancer in her brother; Stroke in her mother. There is no history of Colon cancer, Rectal cancer, or Stomach cancer.  ROS:   Please see the history of present illness. All other systems reviewed and are negative.   Prior CV studies:   The following studies were reviewed today:  Cardiac catheterization 12/29/2017:  The left ventricular systolic function is normal.  LV end diastolic pressure is mildly elevated.  The left ventricular ejection fraction is 55-65% by visual estimate.  Mid RCA lesion is 40% stenosed.  Prox Cx lesion is 30% stenosed.  Ost 1st Diag lesion is 40% stenosed.  Previously  placed Mid LAD stent (unknown type) is widely patent.  1. Widely patent mid LAD stent with no restenosis. Mild to moderate nonobstructive disease affecting the RCA and left circumflex. 2. Normal LV systolic function and mildly elevated left ventricular end-diastolic pressure.  Recommendations: I suspect noncardiac chest pain. Continue medical therapy.  Echocardiogram 12/28/2017: Study Conclusions  - Left ventricle: The cavity size was normal. Wall thickness was increased in a pattern of mild LVH. Systolic function was normal. The estimated ejection fraction was in the range of 60% to 65%. Wall motion was normal; there were no regional wall motion abnormalities. Doppler parameters are consistent with abnormal left ventricular relaxation (grade 1 diastolic dysfunction). Doppler parameters are consistent with high ventricular filling pressure. - Aortic valve: Mildly to moderately calcified annulus. Trileaflet.  Labs/Other Tests and Data Reviewed:    EKG:  An ECG dated 12/28/2017 was personally reviewed today and demonstrated:  Sinus rhythm with left atrial enlargement and R' in lead V1 and V2.  Recent Labs: 04/19/2019: ALT 32; BUN 7; Creatinine, Ser 0.78; Hemoglobin 14.8; Platelets 290.0; Potassium 4.0; Sodium 140   Recent Lipid Panel Lab Results  Component Value Date/Time   CHOL 160 12/29/2017 02:58 AM   TRIG 191 (H) 12/29/2017 02:58 AM   HDL 40 (L) 12/29/2017 02:58 AM   CHOLHDL 4.0 12/29/2017 02:58 AM   LDLCALC 82 12/29/2017 02:58 AM    Wt Readings from Last 3 Encounters:  05/17/19 155 lb (70.3 kg)  05/11/19 160 lb (72.6 kg)  04/19/19 160 lb (72.6 kg)     Objective:    Vital Signs:  BP 133/80   Pulse 62   Ht 5\' 2"  (1.575 m)   Wt 155 lb (70.3 kg)   BMI 28.35 kg/m    Patient spoke in full sentences, not short of breath. No audible wheezing or coughing. Speech pattern normal.  ASSESSMENT & PLAN:    1.  CAD status post DES to the LAD in 2014.   Stent site was patent and angiography last year.  Plan is to continue medical therapy and observation in the absence of progressive angina symptoms.  She continues on aspirin, Norvasc, Lipitor, Imdur, Lopressor, and Hyzaar.  2.  Mixed hyperlipidemia.  She remains on statin therapy and continues to follow with Dr. Quillian Quince.  LDL  was 82 last year.  3.  Ongoing GI evaluation for diverticulitis and esophagitis.  4.  Essential hypertension, systolic is in the [id number] today.  No changes made to baseline regimen.  COVID-19 Education: The signs and symptoms of COVID-19 were discussed with the patient and how to seek care for testing (follow up with PCP or arrange E-visit).  The importance of social distancing was discussed today.  Time:   Today, I have spent 8 minutes with the patient with telehealth technology discussing the above problems.     Medication Adjustments/Labs and Tests Ordered: Current medicines are reviewed at length with the patient today.  Concerns regarding medicines are outlined above.   Tests Ordered: No orders of the defined types were placed in this encounter.   Medication Changes: No orders of the defined types were placed in this encounter.   Follow Up:  In Person 6 months in the [place] office.  Signed, Rozann Lesches, MD  05/17/2019 2:04 PM    [place]

## 2019-05-18 DIAGNOSIS — E1165 Type 2 diabetes mellitus with hyperglycemia: Secondary | ICD-10-CM | POA: Diagnosis not present

## 2019-05-18 DIAGNOSIS — E782 Mixed hyperlipidemia: Secondary | ICD-10-CM | POA: Diagnosis not present

## 2019-05-18 DIAGNOSIS — I10 Essential (primary) hypertension: Secondary | ICD-10-CM | POA: Diagnosis not present

## 2019-05-21 ENCOUNTER — Other Ambulatory Visit: Payer: Self-pay | Admitting: Cardiology

## 2019-05-22 ENCOUNTER — Other Ambulatory Visit: Payer: Self-pay

## 2019-05-22 ENCOUNTER — Ambulatory Visit (HOSPITAL_COMMUNITY)
Admission: RE | Admit: 2019-05-22 | Discharge: 2019-05-22 | Disposition: A | Discharge status: Home / Self Care | Payer: Medicare HMO | Source: Ambulatory Visit | Attending: Physician Assistant | Admitting: Physician Assistant

## 2019-05-22 DIAGNOSIS — R1310 Dysphagia, unspecified: Secondary | ICD-10-CM | POA: Diagnosis not present

## 2019-05-22 DIAGNOSIS — K219 Gastro-esophageal reflux disease without esophagitis: Secondary | ICD-10-CM | POA: Diagnosis not present

## 2019-05-22 DIAGNOSIS — K224 Dyskinesia of esophagus: Secondary | ICD-10-CM | POA: Diagnosis not present

## 2019-05-23 ENCOUNTER — Other Ambulatory Visit: Payer: Self-pay

## 2019-05-23 DIAGNOSIS — R1310 Dysphagia, unspecified: Secondary | ICD-10-CM

## 2019-05-24 ENCOUNTER — Encounter: Payer: Self-pay | Admitting: Gastroenterology

## 2019-05-25 ENCOUNTER — Other Ambulatory Visit: Payer: Self-pay | Admitting: Gastroenterology

## 2019-05-25 DIAGNOSIS — E1151 Type 2 diabetes mellitus with diabetic peripheral angiopathy without gangrene: Secondary | ICD-10-CM | POA: Diagnosis not present

## 2019-05-25 DIAGNOSIS — K580 Irritable bowel syndrome with diarrhea: Secondary | ICD-10-CM | POA: Diagnosis not present

## 2019-05-25 DIAGNOSIS — K219 Gastro-esophageal reflux disease without esophagitis: Secondary | ICD-10-CM | POA: Diagnosis not present

## 2019-05-25 DIAGNOSIS — I6523 Occlusion and stenosis of bilateral carotid arteries: Secondary | ICD-10-CM | POA: Diagnosis not present

## 2019-05-25 DIAGNOSIS — I2510 Atherosclerotic heart disease of native coronary artery without angina pectoris: Secondary | ICD-10-CM | POA: Diagnosis not present

## 2019-05-25 DIAGNOSIS — Z0001 Encounter for general adult medical examination with abnormal findings: Secondary | ICD-10-CM | POA: Diagnosis not present

## 2019-05-25 DIAGNOSIS — E1165 Type 2 diabetes mellitus with hyperglycemia: Secondary | ICD-10-CM | POA: Diagnosis not present

## 2019-05-25 DIAGNOSIS — G4733 Obstructive sleep apnea (adult) (pediatric): Secondary | ICD-10-CM | POA: Diagnosis not present

## 2019-05-25 DIAGNOSIS — E782 Mixed hyperlipidemia: Secondary | ICD-10-CM | POA: Diagnosis not present

## 2019-05-25 DIAGNOSIS — R69 Illness, unspecified: Secondary | ICD-10-CM | POA: Diagnosis not present

## 2019-05-25 DIAGNOSIS — I10 Essential (primary) hypertension: Secondary | ICD-10-CM | POA: Diagnosis not present

## 2019-05-25 DIAGNOSIS — J301 Allergic rhinitis due to pollen: Secondary | ICD-10-CM | POA: Diagnosis not present

## 2019-05-25 DIAGNOSIS — G252 Other specified forms of tremor: Secondary | ICD-10-CM | POA: Diagnosis not present

## 2019-05-25 DIAGNOSIS — Z6828 Body mass index (BMI) 28.0-28.9, adult: Secondary | ICD-10-CM | POA: Diagnosis not present

## 2019-05-25 DIAGNOSIS — Z1159 Encounter for screening for other viral diseases: Secondary | ICD-10-CM | POA: Diagnosis not present

## 2019-05-26 LAB — SARS CORONAVIRUS 2 (TAT 6-24 HRS): SARS Coronavirus 2: NEGATIVE

## 2019-05-29 ENCOUNTER — Encounter: Payer: Self-pay | Admitting: Gastroenterology

## 2019-05-29 ENCOUNTER — Ambulatory Visit (AMBULATORY_SURGERY_CENTER): Payer: Medicare HMO | Admitting: Gastroenterology

## 2019-05-29 ENCOUNTER — Other Ambulatory Visit: Payer: Self-pay

## 2019-05-29 VITALS — BP 120/64 | HR 90 | Temp 98.3°F | Resp 20

## 2019-05-29 DIAGNOSIS — K449 Diaphragmatic hernia without obstruction or gangrene: Secondary | ICD-10-CM

## 2019-05-29 DIAGNOSIS — K222 Esophageal obstruction: Secondary | ICD-10-CM | POA: Diagnosis not present

## 2019-05-29 DIAGNOSIS — K3189 Other diseases of stomach and duodenum: Secondary | ICD-10-CM

## 2019-05-29 DIAGNOSIS — R1310 Dysphagia, unspecified: Secondary | ICD-10-CM

## 2019-05-29 MED ORDER — SODIUM CHLORIDE 0.9 % IV SOLN: 500.0000 mL | Freq: Once | INTRAVENOUS | Status: DC

## 2019-05-29 NOTE — Progress Notes (Signed)
Pt tolerated procedure wqell. VSS. Arousable to recovery.

## 2019-05-29 NOTE — Patient Instructions (Addendum)
Thank you for allowing [place] to care for you today!  Await biopsy results, approximately 2 weeks.  Will make any recommendations at that time.  Resume previous diet and medications today.  Return to normal activities tomorrow.     YOU HAD AN ENDOSCOPIC PROCEDURE TODAY AT [facility]:   Refer to the procedure report that was given to you for any specific questions about what was found during the examination.  If the procedure report does not answer your questions, please call your gastroenterologist to clarify.  If you requested that your care partner not be given the details of your procedure findings, then the procedure report has been included in a sealed envelope for you to review at your convenience later.  YOU SHOULD EXPECT: Some feelings of bloating in the abdomen. Passage of more gas than usual.  Walking can help get rid of the air that was put into your GI tract during the procedure and reduce the bloating. If you had a lower endoscopy (such as a colonoscopy or flexible sigmoidoscopy) you may notice spotting of blood in your stool or on the toilet paper. If you underwent a bowel prep for your procedure, you may not have a normal bowel movement for a few days.  Please Note:  You might notice some irritation and congestion in your nose or some drainage.  This is from the oxygen used during your procedure.  There is no need for concern and it should clear up in a day or so.  SYMPTOMS TO REPORT IMMEDIATELY:    Following upper endoscopy (EGD)  Vomiting of blood or coffee ground material  New chest pain or pain under the shoulder blades  Painful or persistently difficult swallowing  New shortness of breath  Fever of 100F or higher  Black, tarry-looking stools  For urgent or emergent issues, a gastroenterologist can be reached at any hour by calling [phone].   DIET:  We do recommend a small meal at first, but then you may proceed to your regular diet.  Drink  plenty of fluids but you should avoid alcoholic beverages for 24 hours.  ACTIVITY:  You should plan to take it easy for the rest of today and you should NOT DRIVE or use heavy machinery until tomorrow (because of the sedation medicines used during the test).    FOLLOW UP: Our staff will call the number listed on your records 48-72 hours following your procedure to check on you and address any questions or concerns that you may have regarding the information given to you following your procedure. If we do not reach you, we will leave a message.  We will attempt to reach you two times.  During this call, we will ask if you have developed any symptoms of COVID 19. If you develop any symptoms (ie: fever, flu-like symptoms, shortness of breath, cough etc.) before then, please call [phone].  If you test positive for Covid 19 in the 2 weeks post procedure, please call and report this information to [place].    If any biopsies were taken you will be contacted by phone or by letter within the next 1-3 weeks.  Please call us at [phone] if you have not heard about the biopsies in 3 weeks.    SIGNATURES/CONFIDENTIALITY: You and/or your care partner have signed paperwork which will be entered into your electronic medical record.  These signatures attest to the fact that that the information above on your After Visit Summary has been  reviewed and is understood.  Full responsibility of the confidentiality of this discharge information lies with you and/or your care-partner. 

## 2019-05-29 NOTE — Op Note (Signed)
[name] Patient Name: [name] Procedure Date: 05/29/2019 2:59 PM MRN: [id number] Endoscopist: [place]. Loletha Freeman , MD Age: 68 Referring MD:  Date of Birth: Jan 28, 1951 Gender: Female Account #: [id number] Procedure:                Upper GI endoscopy Indications:              Esophageal dysphagia, Unexplained chest pain Medicines:                Monitored Anesthesia Care Procedure:                Pre-Anesthesia Assessment:                           - Prior to the procedure, a History and Physical                            was performed, and patient medications and                            allergies were reviewed. The patient's tolerance of                            previous anesthesia was also reviewed. The risks                            and benefits of the procedure and the sedation                            options and risks were discussed with the patient.                            All questions were answered, and informed consent                            was obtained. Prior Anticoagulants: The patient has                            taken no previous anticoagulant or antiplatelet                            agents except for aspirin. ASA Grade Assessment:                            III - A patient with severe systemic disease. After                            reviewing the risks and benefits, the patient was                            deemed in satisfactory condition to undergo the                            procedure.  After obtaining informed consent, the endoscope was                            passed under direct vision. Throughout the                            procedure, the patient's blood pressure, pulse, and                            oxygen saturations were monitored continuously. The                            Endoscope was introduced through the mouth, and                            advanced to the second part of duodenum.  The upper                            GI endoscopy was accomplished without difficulty.                            The patient tolerated the procedure well. Scope In: Scope Out: Findings:                 A low-grade of narrowing Schatzki ring was found at                            the gastroesophageal junction. This was biopsied                            with a cold forceps for disruption/partial removal                            ring - tissue discarded.                           A small hiatal hernia was present.                           Diffuse hemorrhagic mucosa was found in the entire                            examined stomach.                           The cardia and gastric fundus were normal on                            retroflexion.                           The examined duodenum was normal. Complications:            No immediate complications. Estimated Blood Loss:     Estimated blood loss was minimal. Impression:               -  Low-grade of narrowing Schatzki ring. Biopsied.                           - Small hiatal hernia.                           - Hemorrhagic gastropathy.                           - Normal examined duodenum.                           Patient's symptoms appear to be largely from distal                            esophageal spasm (see below). Esophageal ring                            patent, but may have been contributing to symptoms                            with underlying motility disorder. Recommendation:           - Patient has a contact number available for                            emergencies. The signs and symptoms of potential                            delayed complications were discussed with the                            patient. Return to normal activities tomorrow.                            Written discharge instructions were provided to the                            patient.                           - Resume previous diet.                            - Continue present medications.                           - Report will be forwarded to patient's                            cardiologist for consideration of changing                            anti-hypertensive to diltiazem to help control                            distal esophageal spasm demonstrated on  01/2018                            esophageal manometry. Phyllis L. [name], MD 05/29/2019 3:32:17 PM This report has been signed electronically.

## 2019-05-29 NOTE — Progress Notes (Signed)
[place] Temp-JB  History reviewed. 

## 2019-05-30 ENCOUNTER — Telehealth: Payer: Self-pay | Admitting: Cardiology

## 2019-05-30 NOTE — Telephone Encounter (Signed)
I received communication from Dr. [name] regarding the possibility of putting [name] on Diltiazem to help with esophageal spasm (relax distal esophageal sphincter). She is on Norvasc, Imdur, Hyzaar, and Lopressor and heart rate at last check was 60. If she is willing to try medication adjustment, I would suggest that we stop Norvasc and replace it with Diltiazem CD 120 mg daily. At the same time I would reduce Lopressor to 25 mg BID to hopefully avoid bradycardia. No change in other medications for now.

## 2019-05-31 ENCOUNTER — Telehealth: Payer: Self-pay | Admitting: *Deleted

## 2019-05-31 NOTE — Telephone Encounter (Signed)
  Follow up Call-  Call back number 05/29/2019 02/08/2018  Post procedure Call Back phone  # [phone]  Permission to leave phone message No Yes  Some recent data might be hidden     Patient questions:  Do you have a fever, pain , or abdominal swelling? No. Pain Score  0 *  Have you tolerated food without any problems? Yes.    Have you been able to return to your normal activities? Yes.    Do you have any questions about your discharge instructions: Diet   No. Medications  No. Follow up visit  No.  Do you have questions or concerns about your Care? No.  Actions: * If pain score is 4 or above: No action needed, pain <4.  1. Have you developed a fever since your procedure? no  2.   Have you had an respiratory symptoms (SOB or cough) since your procedure? no  3.   Have you tested positive for COVID 19 since your procedure no  4.   Have you had any family members/close contacts diagnosed with the COVID 19 since your procedure?  no   If yes to any of these questions please route to Joylene John, RN and Alphonsa Gin, Therapist, sports.

## 2019-06-01 MED ORDER — METOPROLOL TARTRATE 25 MG PO TABS: 25.0000 mg | ORAL_TABLET | Freq: Two times a day (BID) | ORAL | 3 refills | Status: DC

## 2019-06-01 MED ORDER — DILTIAZEM HCL ER COATED BEADS 120 MG PO CP24: 120.0000 mg | ORAL_CAPSULE | Freq: Every day | ORAL | 3 refills | Status: DC

## 2019-06-01 NOTE — Telephone Encounter (Signed)
Patient informed and verbalized understanding of plan. Medication adjustments also sent to patient via mychart per her request.

## 2019-06-01 NOTE — Addendum Note (Signed)
Addended by: Merlene Laughter on: 06/01/2019 09:35 AM   Modules accepted: Orders

## 2019-06-05 ENCOUNTER — Telehealth: Payer: Self-pay | Admitting: Physician Assistant

## 2019-06-05 NOTE — Telephone Encounter (Signed)
This was routed to the wrong RN- Please assist this patient in this request

## 2019-06-05 NOTE — Telephone Encounter (Signed)
Yes can increase to BID dosing. Thanks-JLL

## 2019-06-05 NOTE — Telephone Encounter (Signed)
Spoke to the patient who reports the omeprazole does not "last long enough" because by the end of the night "I'm throwing up acid." She states she last eats food at 4 pm or 5 pm. She is requesting a change in therapy or an increase in dose change. Please advise.

## 2019-06-05 NOTE — Telephone Encounter (Signed)
Called patient and let her know Ellouise Newer PA is ok with her taking her Omeprazole BID

## 2019-06-06 DIAGNOSIS — Z23 Encounter for immunization: Secondary | ICD-10-CM | POA: Diagnosis not present

## 2019-06-21 ENCOUNTER — Ambulatory Visit: Payer: Medicare HMO | Admitting: Gastroenterology

## 2019-06-21 ENCOUNTER — Encounter: Payer: Self-pay | Admitting: Gastroenterology

## 2019-06-21 ENCOUNTER — Telehealth: Payer: Self-pay | Admitting: *Deleted

## 2019-06-21 VITALS — BP 120/60 | HR 68 | Temp 98.0°F | Ht 62.0 in | Wt 160.4 lb

## 2019-06-21 DIAGNOSIS — K224 Dyskinesia of esophagus: Secondary | ICD-10-CM | POA: Diagnosis not present

## 2019-06-21 DIAGNOSIS — K582 Mixed irritable bowel syndrome: Secondary | ICD-10-CM

## 2019-06-21 DIAGNOSIS — R1310 Dysphagia, unspecified: Secondary | ICD-10-CM

## 2019-06-21 DIAGNOSIS — R1319 Other dysphagia: Secondary | ICD-10-CM

## 2019-06-21 NOTE — Telephone Encounter (Signed)
Patient informed and verbalized understanding of plan. Home BP monitoring instructions sent to patient via mychart.

## 2019-06-21 NOTE — Patient Instructions (Addendum)
If you are age 70 or older, your body mass index should be between 23-30. Your Body mass index is 29.34 kg/m. If this is out of the aforementioned range listed, please consider follow up with your Primary Care Provider.  If you are age 15 or younger, your body mass index should be between 19-25. Your Body mass index is 29.34 kg/m. If this is out of the aformentioned range listed, please consider follow up with your Primary Care Provider.   Follow up as needed. [phone]  It was a pleasure to see you today!  Dr. [name]

## 2019-06-21 NOTE — Telephone Encounter (Signed)
-----   Message from [name], MD sent at 06/21/2019 12:01 PM EST ----- Thanks for giving me some follow-up on this.  I will have nursing contact her about making some blood pressure recordings at home and then having a nurse visit in a few weeks to see if we need to titrate medications further. ----- Message ----- From: Doran Stabler, MD Sent: 06/21/2019  11:59 AM EST To: [name], MD  Sam,  Thanks for starting her on the diltiazem.  It seems to be helping her esophageal spasm somewhat.  When I saw her today, she reported that blood pressure has often been running high in the afternoon, systolic as high as [id number] at times.  I imagine you have follow-up planned with her, and just wanted to alert you.  - HD

## 2019-06-21 NOTE — Progress Notes (Signed)
[facility] Progress Note  Chief Complaint: Esophageal dysphagia  Subjective  History:  [name] has ongoing trouble with esophageal dysphagia, she feels food stuck in the esophagus she has to regurgitate it and sometimes makes her feel as if she is choking.  She has known distal esophageal spasm from prior manometry, and last year her cardiologist was agreeable to putting her on long-acting nitrates.  That has not seemed to help very much.  One of our PAs saw her in clinic recently for this ongoing dysphagia, she had upper endoscopy with me, it showed similar findings to previous exams with a widely patent Schatzki ring not amenable to dilation. Afterwards, I messaged her cardiologist, Dr. Domenic Polite, and he was agreeable to change from her amlodipine to diltiazem in hopes that it might address this esophageal spasm as well as her underlying hypertension. [name] also has underlying irritable bowel, and tends alternate between constipation and diarrhea.  She was recently put on diltiazem 120 mg a day, and the dysphagia and regurgitation are somewhat improved.  She is still bothered by her IBS as before.  [name] has been keeping her blood pressure log regularly and notes that in the afternoon it is sometimes as high as 123XX123 systolic lately.  ROS: Cardiovascular:  no exertional chest pain Respiratory: no dyspnea Chronic anxiety, stable  The patient's Past Medical, Family and Social History were reviewed and are on file in the EMR.  Objective:  Med list reviewed  Current Outpatient Medications:  .  albuterol (PROVENTIL HFA;VENTOLIN HFA) 108 (90 Base) MCG/ACT inhaler, Inhale 2 puffs into the lungs every 4 (four) hours as needed., Disp: , Rfl: 3 .  ALPRAZolam (XANAX) 1 MG tablet, TAKE 1 TABLET BY MOUTH AT BEDTIME AS NEEDED FOR ANXIETY, Disp: , Rfl: 1 .  aspirin EC 81 MG tablet, Take 81 mg by mouth daily., Disp: , Rfl:  .  atorvastatin (LIPITOR) 10 MG tablet, TAKE 1 TABLET BY MOUTH  DAILY, Disp: 90 tablet, Rfl: 1 .  cetirizine (ZYRTEC) 10 MG tablet, Take 10 mg by mouth daily., Disp: , Rfl:  .  dicyclomine (BENTYL) 10 MG capsule, Take 1 capsule (10 mg total) by mouth 2 (two) times daily. Take 20-30 minutes before breakfast and dinner, Disp: 60 capsule, Rfl: 2 .  diltiazem (CARDIZEM CD) 120 MG 24 hr capsule, Take 1 capsule (120 mg total) by mouth daily., Disp: 90 capsule, Rfl: 3 .  DULoxetine (CYMBALTA) 60 MG capsule, Take 60 mg by mouth daily. , Disp: , Rfl:  .  HYDROcodone-acetaminophen (NORCO/VICODIN) 5-325 MG tablet, 1 tablet 2 (two) times daily. , Disp: , Rfl: 0 .  isosorbide mononitrate (IMDUR) 30 MG 24 hr tablet, TAKE 1/2 TABLET BY MOUTH DAILY, Disp: 45 tablet, Rfl: 1 .  losartan-hydrochlorothiazide (HYZAAR) 100-25 MG tablet, Take 1 tablet by mouth daily., Disp: , Rfl:  .  metoprolol tartrate (LOPRESSOR) 25 MG tablet, Take 1 tablet (25 mg total) by mouth 2 (two) times daily., Disp: 180 tablet, Rfl: 3 .  montelukast (SINGULAIR) 10 MG tablet, Take 10 mg by mouth at bedtime. , Disp: , Rfl:  .  nitroGLYCERIN (NITROSTAT) 0.4 MG SL tablet, Place 0.4 mg under the tongue every 5 (five) minutes x 3 doses as needed (if no relief after 3rd dose, proceed to the ED for an evaluation or call 911). , Disp: , Rfl:  .  omeprazole (PRILOSEC) 40 MG capsule, Take 1 capsule (40 mg total) by mouth daily. (Patient taking differently: Take 40 mg by  mouth 2 (two) times daily. ), Disp: 30 capsule, Rfl: 3 .  promethazine (PHENERGAN) 25 MG tablet, Take 25 mg by mouth at bedtime. Takes prn and at times does 1/2 tablet instead of whole one, Disp: , Rfl:  .  traZODone (DESYREL) 50 MG tablet, Take 200 mg by mouth at bedtime. 4 tabs hs, Disp: , Rfl:  .  TRESIBA FLEXTOUCH 200 UNIT/ML SOPN, Inject 100 Units into the skin 2 (two) times daily as needed. As directed, Disp: , Rfl:    Vital signs in last 24 hrs: Vitals:   06/21/19 1007  BP: 120/60  Pulse: 68  Temp: 98 F (36.7 C)    Physical Exam   Well-appearing, normal vocal quality  HEENT: sclera anicteric, oral mucosa moist without lesions  Neck: supple, no thyromegaly, JVD or lymphadenopathy  Cardiac: RRR without murmurs, S1S2 heard, no peripheral edema  Pulm: clear to auscultation bilaterally, normal RR and effort noted  Abdomen: soft, no tenderness, with active bowel sounds. No guarding or palpable hepatosplenomegaly.  Skin; warm and dry, no jaundice or rash  Endoscopy report, previous esophageal manometry report reviewed with patient.  @ASSESSMENTPLANBEGIN @ Assessment: Encounter Diagnoses  Name Primary?  . Esophageal dysphagia Yes  . Esophageal spasm   . Irritable bowel syndrome with both constipation and diarrhea    I am hopeful that the diltiazem will continue to help this esophageal spasm that has been so distressing to her.  Sounds like her blood pressure has been running high at times, and there will probably be an adjustment.  With this new medicine. We will message her cardiologist and copy this note so they can consider close follow-up with her and any necessary medicine adjustments.  She does her best to get adequate fiber intake, takes MiraLAX as needed, and her IBS seems to be under the best control it can be.  Suspect some underlying anxiety contributes to that as well.  Total time 20 minutes, over half spent face-to-face with patient in counseling and coordination of care.   Nelida Meuse III

## 2019-06-29 ENCOUNTER — Other Ambulatory Visit: Payer: Self-pay

## 2019-06-29 ENCOUNTER — Other Ambulatory Visit: Payer: Self-pay | Admitting: Physician Assistant

## 2019-06-29 NOTE — Telephone Encounter (Signed)
This has already been taken care of by a co-worker.

## 2019-06-29 NOTE — Telephone Encounter (Signed)
This encounter was opened for a drug refills.

## 2019-06-30 ENCOUNTER — Other Ambulatory Visit: Payer: Self-pay | Admitting: Cardiology

## 2019-07-04 ENCOUNTER — Other Ambulatory Visit: Payer: Self-pay

## 2019-07-04 ENCOUNTER — Ambulatory Visit: Payer: Medicare HMO | Admitting: *Deleted

## 2019-07-04 DIAGNOSIS — R69 Illness, unspecified: Secondary | ICD-10-CM | POA: Diagnosis not present

## 2019-07-04 DIAGNOSIS — I10 Essential (primary) hypertension: Secondary | ICD-10-CM

## 2019-07-04 NOTE — Progress Notes (Signed)
Patient in office for nurse bp check to compare with her home machine.   Our machine:  144/73  62  95%  Her monitor:  146/87  59   See extra readings scanned into epic.  Typically highest readings are first thing in the morning & also cautioned her about checking repeated times back to back as this may cause a false elevation in her bp.

## 2019-08-01 DIAGNOSIS — Z20828 Contact with and (suspected) exposure to other viral communicable diseases: Secondary | ICD-10-CM | POA: Diagnosis not present

## 2019-08-01 DIAGNOSIS — Z20822 Contact with and (suspected) exposure to covid-19: Secondary | ICD-10-CM | POA: Diagnosis not present

## 2019-08-08 DIAGNOSIS — Z1231 Encounter for screening mammogram for malignant neoplasm of breast: Secondary | ICD-10-CM | POA: Diagnosis not present

## 2019-08-21 ENCOUNTER — Telehealth: Payer: Self-pay | Admitting: Physician Assistant

## 2019-08-21 MED ORDER — OMEPRAZOLE 40 MG PO CPDR: 40.0000 mg | DELAYED_RELEASE_CAPSULE | Freq: Two times a day (BID) | ORAL | 3 refills | Status: DC

## 2019-08-21 NOTE — Telephone Encounter (Signed)
Rx sent to pharmacy   

## 2019-08-21 NOTE — Telephone Encounter (Signed)
Pharmacy called stating that she spoke with patient and per Ellouise Newer she change the dosage for Omeprazole 40 mg to 1 capsule twice a day so they are requesting a new script. 90 day supply total of 180 patient also requested 90 day refill

## 2019-09-25 ENCOUNTER — Other Ambulatory Visit: Payer: Self-pay | Admitting: Cardiology

## 2019-10-16 ENCOUNTER — Telehealth: Payer: Self-pay | Admitting: Gastroenterology

## 2019-10-16 ENCOUNTER — Other Ambulatory Visit: Payer: Self-pay

## 2019-10-16 ENCOUNTER — Other Ambulatory Visit: Payer: Self-pay | Admitting: *Deleted

## 2019-10-16 MED ORDER — DILTIAZEM HCL ER COATED BEADS 120 MG PO CP24: 120.0000 mg | ORAL_CAPSULE | Freq: Every day | ORAL | 0 refills | Status: DC

## 2019-10-16 MED ORDER — OMEPRAZOLE 40 MG PO CPDR: 40.0000 mg | DELAYED_RELEASE_CAPSULE | Freq: Two times a day (BID) | ORAL | 3 refills | Status: DC

## 2019-10-16 MED ORDER — DICYCLOMINE HCL 10 MG PO CAPS: ORAL_CAPSULE | ORAL | 2 refills | Status: DC

## 2019-10-16 MED ORDER — ISOSORBIDE MONONITRATE ER 30 MG PO TB24: 15.0000 mg | ORAL_TABLET | Freq: Every day | ORAL | 0 refills | Status: DC

## 2019-10-16 MED ORDER — METOPROLOL TARTRATE 25 MG PO TABS: 25.0000 mg | ORAL_TABLET | Freq: Two times a day (BID) | ORAL | 0 refills | Status: DC

## 2019-10-16 NOTE — Telephone Encounter (Signed)
Refilled at a 90 day supply as requested.

## 2019-10-16 NOTE — Telephone Encounter (Signed)
Pt requested to update pharmacy to [facility] in [place] and requested refills for dicyclomine and omeprazole.

## 2019-10-25 ENCOUNTER — Other Ambulatory Visit: Payer: Self-pay | Admitting: Cardiology

## 2019-10-25 ENCOUNTER — Telehealth: Payer: Self-pay | Admitting: Cardiology

## 2019-10-25 NOTE — Telephone Encounter (Signed)
Patient changed pharmacies to [facility] [phone]

## 2019-10-25 NOTE — Telephone Encounter (Signed)
Noted and changed.  

## 2019-12-04 ENCOUNTER — Telehealth: Payer: Self-pay | Admitting: Gastroenterology

## 2019-12-04 ENCOUNTER — Other Ambulatory Visit: Payer: Self-pay

## 2019-12-04 MED ORDER — OMEPRAZOLE 40 MG PO CPDR: 40.0000 mg | DELAYED_RELEASE_CAPSULE | Freq: Two times a day (BID) | ORAL | 3 refills | Status: DC

## 2019-12-04 NOTE — Telephone Encounter (Signed)
Patient needs to refill Omeprazole for 90-day supply. Please send it to [facility] in [place].

## 2019-12-04 NOTE — Telephone Encounter (Signed)
This was filled in March 2021, but resent as requested.

## 2019-12-06 ENCOUNTER — Other Ambulatory Visit: Payer: Self-pay

## 2019-12-06 MED ORDER — OMEPRAZOLE 40 MG PO CPDR: 40.0000 mg | DELAYED_RELEASE_CAPSULE | Freq: Two times a day (BID) | ORAL | 3 refills | Status: DC

## 2019-12-06 NOTE — Progress Notes (Signed)
Cardiology Office Note  Date: 12/07/2019   ID: [name], DOB Jun 05, 1951, MRN [id number]  PCP:  Caryl Bis, MD  Cardiologist:  Rozann Lesches, MD Electrophysiologist:  None   Chief Complaint  Patient presents with  . Cardiac follow-up    History of Present Illness: [name] is a 69 y.o. female last assessed via telehealth encounter in October 2020.  She presents for a routine visit.  From a cardiac perspective she does not report any recurring angina symptoms.  She recently had skin cancer surgery.  Her medications which are outlined below.  Current cardiac regimen includes aspirin, Lipitor, Cardizem CD, Imdur, Hyzaar, and Lopressor.  She reports having had recent lab work with Dr. Quillian Quince in March.  I personally reviewed her ECG today which shows sinus bradycardia with R' in lead V1 and V2.  Past Medical History:  Diagnosis Date  . Anxiety   . Arthritis   . Asthma   . Barrett esophagus   . Chronic bronchitis ([place])   . Chronic diarrhea   . Chronic lower back pain   . Coronary atherosclerosis of native coronary artery    DES to LAD May 2014  . Diverticulosis   . Essential hypertension   . Fatty liver   . Fibromyalgia   . GERD (gastroesophageal reflux disease)   . H/O hiatal hernia   . IBS (irritable bowel syndrome)   . Major depression in partial remission ([place])   . Migraines   . Mixed hyperlipidemia   . Myocardial infarction ([place])    2014  . OSA on CPAP   . Osteoporosis   . Pancreatitis 2008  . Pneumonia 1990's  . Schatzki's ring   . Sleep apnea   . Type 2 diabetes mellitus ([place])     Past Surgical History:  Procedure Laterality Date  . [place] STUDY N/A 02/16/2018   Procedure: [place] STUDY;  Surgeon: Mauri Pole, MD;  Location: [facility];  Service: Endoscopy;  Laterality: N/A;  . ABDOMINAL HYSTERECTOMY  ?1987  . APPENDECTOMY  ?1987  . BUNIONECTOMY Bilateral 1972  . CARPAL TUNNEL RELEASE Bilateral ?1990  . CATARACT  EXTRACTION W/ INTRAOCULAR LENS  IMPLANT, BILATERAL Bilateral ?2005  . CESAREAN SECTION  1985  . CHOLECYSTECTOMY  1990'S  . COLONOSCOPY  10/13   Dr. Britta Mccreedy - tubular adenomas and diverticulosis  . DILATION AND CURETTAGE OF UTERUS  1970's   "probably 3" (12/09/2012)  . ERCP  2008  . ESOPHAGEAL DILATION  ` 2012  . ESOPHAGEAL MANOMETRY N/A 10/21/2015   Procedure: ESOPHAGEAL MANOMETRY (EM);  Surgeon: Doran Stabler, MD;  Location: [facility];  Service: Gastroenterology;  Laterality: N/A;  . ESOPHAGEAL MANOMETRY N/A 02/16/2018   Procedure: ESOPHAGEAL MANOMETRY (EM);  Surgeon: Mauri Pole, MD;  Location: [facility];  Service: Endoscopy;  Laterality: N/A;  . HUMERUS FRACTURE SURGERY Right 07/19/1969   "horst threw me" (12/09/2012)  . LEFT HEART CATH AND CORONARY ANGIOGRAPHY N/A 12/29/2017   Procedure: LEFT HEART CATH AND CORONARY ANGIOGRAPHY;  Surgeon: Wellington Hampshire, MD;  Location: [facility];  Service: Cardiovascular;  Laterality: N/A;  . NASAL SEPTUM SURGERY  1970's  . PERCUTANEOUS CORONARY STENT INTERVENTION (PCI-S) N/A 12/09/2012   Procedure: PERCUTANEOUS CORONARY STENT INTERVENTION (PCI-S);  Surgeon: Burnell Blanks, MD;  Location: [facility] CATH LAB;  Service: Cardiovascular;  Laterality: N/A;  . [place] IMPEDANCE STUDY N/A 02/16/2018   Procedure: [place] IMPEDANCE STUDY;  Surgeon: Mauri Pole,  MD;  Location: [facility];  Service: Endoscopy;  Laterality: N/A;  . TOE FUSION Right 10/11   First MTP joint  . TOE SURGERY Right 1990's   "toe next to big toe:  dr said I had tumor; cut bone & stuff; another OR dr stretched tendons, etc" (12/09/2012)  . TUBAL LIGATION  1985    Current Outpatient Medications  Medication Sig Dispense Refill  . albuterol (PROVENTIL HFA;VENTOLIN HFA) 108 (90 Base) MCG/ACT inhaler Inhale 2 puffs into the lungs every 4 (four) hours as needed.  3  . ALPRAZolam (XANAX) 1 MG tablet TAKE 1 TABLET BY MOUTH AT BEDTIME AS NEEDED FOR ANXIETY  1  . aspirin  EC 81 MG tablet Take 81 mg by mouth daily.    Marland Kitchen atorvastatin (LIPITOR) 20 MG tablet Take 20 mg by mouth daily.    . cetirizine (ZYRTEC) 10 MG tablet Take 10 mg by mouth daily.    Marland Kitchen dicyclomine (BENTYL) 10 MG capsule TAKE ONE CAPSULE BY MOUTH TWICE DAILY 20-30 MINUTES BEFORE BREAKFAST AND DINNER 180 capsule 2  . diltiazem (CARDIZEM CD) 120 MG 24 hr capsule Take 1 capsule (120 mg total) by mouth daily. 90 capsule 0  . DULoxetine (CYMBALTA) 60 MG capsule Take 60 mg by mouth 2 (two) times daily.     Marland Kitchen HYDROcodone-acetaminophen (NORCO/VICODIN) 5-325 MG tablet 1 tablet 2 (two) times daily.   0  . isosorbide mononitrate (IMDUR) 30 MG 24 hr tablet TAKE 1/2 TABLET BY MOUTH DAILY 15 tablet 0  . losartan-hydrochlorothiazide (HYZAAR) 100-25 MG tablet Take 1 tablet by mouth daily.    . metoprolol tartrate (LOPRESSOR) 25 MG tablet Take 1 tablet (25 mg total) by mouth 2 (two) times daily. 180 tablet 0  . montelukast (SINGULAIR) 10 MG tablet Take 10 mg by mouth at bedtime.     . nitroGLYCERIN (NITROSTAT) 0.4 MG SL tablet Place 0.4 mg under the tongue every 5 (five) minutes x 3 doses as needed (if no relief after 3rd dose, proceed to the ED for an evaluation or call 911).     Marland Kitchen omeprazole (PRILOSEC) 40 MG capsule Take 1 capsule (40 mg total) by mouth 2 (two) times daily. 180 capsule 3  . promethazine (PHENERGAN) 25 MG tablet Take 25 mg by mouth at bedtime. Takes prn and at times does 1/2 tablet instead of whole one    . traZODone (DESYREL) 50 MG tablet Take 200 mg by mouth at bedtime. 4 tabs hs    . TRESIBA FLEXTOUCH 200 UNIT/ML SOPN Inject 100 Units into the skin 2 (two) times daily as needed. As directed     No current facility-administered medications for this visit.   Allergies:  Erythromycin base, Azithromycin, Other, Pollen extract, Remeron [mirtazapine], Tree extract, Zolpidem tartrate, and Sulfonamide derivatives   ROS:   No palpitations or syncope.  Physical Exam: VS:  BP (!) 148/64   Pulse 60   Ht  5\' 2"  (1.575 m)   Wt 165 lb (74.8 kg)   SpO2 94%   BMI 30.18 kg/m , BMI Body mass index is 30.18 kg/m.  Wt Readings from Last 3 Encounters:  12/07/19 165 lb (74.8 kg)  06/21/19 160 lb 6.4 oz (72.8 kg)  05/17/19 155 lb (70.3 kg)    General: Patient appears comfortable at rest. HEENT: Conjunctiva and lids normal, wearing a mask. Neck: Supple, no elevated JVP or carotid bruits, no thyromegaly. Lungs: Clear to auscultation, nonlabored breathing at rest. Cardiac: Regular rate and rhythm, no S3 or significant  systolic murmur, no pericardial rub. Extremities: No pitting edema, distal pulses 2+.  ECG:  An ECG dated 12/28/2017 was personally reviewed today and demonstrated:  Sinus rhythm with left atrial enlargement, R' in lead V1 and V2.  Recent Labwork: 04/19/2019: ALT 32; AST 27; BUN 7; Creatinine, Ser 0.78; Hemoglobin 14.8; Platelets 290.0; Potassium 4.0; Sodium 140     Component Value Date/Time   CHOL 160 12/29/2017 0258   TRIG 191 (H) 12/29/2017 0258   HDL 40 (L) 12/29/2017 0258   CHOLHDL 4.0 12/29/2017 0258   VLDL 38 12/29/2017 0258   LDLCALC 82 12/29/2017 0258    Other Studies Reviewed Today:  Cardiac catheterization 12/29/2017:  The left ventricular systolic function is normal.  LV end diastolic pressure is mildly elevated.  The left ventricular ejection fraction is 55-65% by visual estimate.  Mid RCA lesion is 40% stenosed.  Prox Cx lesion is 30% stenosed.  Ost 1st Diag lesion is 40% stenosed.  Previously placed Mid LAD stent (unknown type) is widely patent.  1. Widely patent mid LAD stent with no restenosis. Mild to moderate nonobstructive disease affecting the RCA and left circumflex. 2. Normal LV systolic function and mildly elevated left ventricular end-diastolic pressure.  Recommendations: I suspect noncardiac chest pain. Continue medical therapy.  Echocardiogram 12/28/2017: Study Conclusions  - Left ventricle: The cavity size was normal. Wall  thickness was increased in a pattern of mild LVH. Systolic function was normal. The estimated ejection fraction was in the range of 60% to 65%. Wall motion was normal; there were no regional wall motion abnormalities. Doppler parameters are consistent with abnormal left ventricular relaxation (grade 1 diastolic dysfunction). Doppler parameters are consistent with high ventricular filling pressure. - Aortic valve: Mildly to moderately calcified annulus. Trileaflet.  Assessment and Plan:  1.  CAD status post DES to the LAD in 2014.  Angiography in 2019 revealed patent stent site and plan to continue medical therapy.  ECG reviewed.  In the absence of recurring angina we will continue medical therapy including aspirin, Lipitor, Cardizem CD, Imdur, Hyzaar, and Lopressor.  2.  Mixed hyperlipidemia on Lipitor.  She reports recent lab work with Dr. Quillian Quince.  Goal LDL is 70 or less.  Medication Adjustments/Labs and Tests Ordered: Current medicines are reviewed at length with the patient today.  Concerns regarding medicines are outlined above.   Tests Ordered: Orders Placed This Encounter  Procedures  . EKG 12-Lead    Medication Changes: No orders of the defined types were placed in this encounter.   Disposition:  Follow up 6 months in the [place] office.  Signed, [name], MD, [facility] 12/07/2019 9:59 AM    [facility], [place], [place] [address] Phone: [phone]; Fax: [phone]

## 2019-12-06 NOTE — Telephone Encounter (Signed)
Please update pt's pharmacy to [facility] and resend omeprazole rx.  Prescription had been sent to [facility] drug.

## 2019-12-06 NOTE — Telephone Encounter (Signed)
Resent to pharmacy requested by patient.

## 2019-12-07 ENCOUNTER — Encounter: Payer: Self-pay | Admitting: Cardiology

## 2019-12-07 ENCOUNTER — Other Ambulatory Visit: Payer: Self-pay

## 2019-12-07 ENCOUNTER — Ambulatory Visit: Payer: Medicare HMO | Admitting: Cardiology

## 2019-12-07 VITALS — BP 148/64 | HR 60 | Ht 62.0 in | Wt 165.0 lb

## 2019-12-07 DIAGNOSIS — E782 Mixed hyperlipidemia: Secondary | ICD-10-CM | POA: Diagnosis not present

## 2019-12-07 DIAGNOSIS — I25119 Atherosclerotic heart disease of native coronary artery with unspecified angina pectoris: Secondary | ICD-10-CM

## 2019-12-07 NOTE — Patient Instructions (Addendum)
Medication Instructions:    Your physician recommends that you continue on your current medications as directed. Please refer to the Current Medication list given to you today.  Labwork:  NONE  Testing/Procedures:  NONE  Follow-Up:  Your physician recommends that you schedule a follow-up appointment in: 6 months (office)  Any Other Special Instructions Will Be Listed Below (If Applicable).  If you need a refill on your cardiac medications before your next appointment, please call your pharmacy. 

## 2019-12-20 ENCOUNTER — Ambulatory Visit: Payer: Medicare HMO | Admitting: Cardiology

## 2020-02-01 ENCOUNTER — Other Ambulatory Visit: Payer: Self-pay | Admitting: Cardiology

## 2020-04-28 ENCOUNTER — Other Ambulatory Visit: Payer: Self-pay | Admitting: Cardiology

## 2020-05-29 ENCOUNTER — Telehealth: Payer: Self-pay | Admitting: Cardiology

## 2020-05-29 NOTE — Telephone Encounter (Signed)
Reports she will be getting regeneron tx for covid in the morning at 8:00 am and wants to know if its safe for her heart Advised that this is fine. Verbalized understanding

## 2020-05-29 NOTE — Telephone Encounter (Signed)
Not treating her COVID will increase risk of health complications (respiratoiry, cardiac, etc). Please follow recommendations from provider.   Wishing her speedy recovery.

## 2020-05-29 NOTE — Telephone Encounter (Signed)
Patient called stating that she was tested positive yesterday for COVID. Dr. Quillian Quince has requested that she go to [facility] for the infusion treatment.  Patient is concerned about her heart issues.

## 2020-05-29 NOTE — Telephone Encounter (Signed)
Patient informed and verbalized understanding of plan. 

## 2020-06-10 ENCOUNTER — Ambulatory Visit: Payer: Medicare HMO | Admitting: Cardiology

## 2020-07-25 ENCOUNTER — Other Ambulatory Visit: Payer: Self-pay | Admitting: Cardiology

## 2020-08-01 ENCOUNTER — Encounter: Payer: Self-pay | Admitting: Cardiology

## 2020-08-01 ENCOUNTER — Ambulatory Visit (INDEPENDENT_AMBULATORY_CARE_PROVIDER_SITE_OTHER): Payer: Medicare HMO | Admitting: Cardiology

## 2020-08-01 VITALS — BP 138/56 | HR 62 | Ht 62.0 in | Wt 160.0 lb

## 2020-08-01 DIAGNOSIS — E782 Mixed hyperlipidemia: Secondary | ICD-10-CM | POA: Diagnosis not present

## 2020-08-01 DIAGNOSIS — I25119 Atherosclerotic heart disease of native coronary artery with unspecified angina pectoris: Secondary | ICD-10-CM

## 2020-08-01 NOTE — Patient Instructions (Addendum)

## 2020-08-01 NOTE — Progress Notes (Signed)
Cardiology Office Note  Date: 08/01/2020   ID: [name], DOB [birth year], MRN [id number]  PCP:  Caryl Bis, MD  Cardiologist:  Rozann Lesches, MD Electrophysiologist:  None   Chief Complaint  Patient presents with  . Cardiac follow-up    History of Present Illness: [name] is a 70 y.o. female last seen in May 2021.  She presents for a routine visit.  From a cardiac perspective she does not report any accelerating angina symptoms on medical therapy.  She did have COVID-19 in November/December 2021.  I reviewed her medications which are outlined below and stable from a cardiac perspective.  She reports compliance with therapy.  She continues to follow with Dr. Quillian Quince.  Past Medical History:  Diagnosis Date  . Anxiety   . Arthritis   . Asthma   . Barrett esophagus   . Chronic bronchitis ([place])   . Chronic diarrhea   . Chronic lower back pain   . Coronary atherosclerosis of native coronary artery    DES to LAD May 2014  . Diverticulosis   . Essential hypertension   . Fatty liver   . Fibromyalgia   . GERD (gastroesophageal reflux disease)   . H/O hiatal hernia   . IBS (irritable bowel syndrome)   . Major depression in partial remission ([place])   . Migraines   . Mixed hyperlipidemia   . Myocardial infarction ([place])    2014  . OSA on CPAP   . Osteoporosis   . Pancreatitis 2008  . Pneumonia 1990's  . Schatzki's ring   . Sleep apnea   . Type 2 diabetes mellitus ([place])     Past Surgical History:  Procedure Laterality Date  . [place] STUDY N/A 02/16/2018   Procedure: [place] STUDY;  Surgeon: Mauri Pole, MD;  Location: [facility];  Service: Endoscopy;  Laterality: N/A;  . ABDOMINAL HYSTERECTOMY  ?1987  . APPENDECTOMY  ?1987  . BUNIONECTOMY Bilateral 1972  . CARPAL TUNNEL RELEASE Bilateral ?1990  . CATARACT EXTRACTION W/ INTRAOCULAR LENS  IMPLANT, BILATERAL Bilateral ?2005  . CESAREAN SECTION  1985  . CHOLECYSTECTOMY  1990'S  .  COLONOSCOPY  10/13   Dr. Britta Mccreedy - tubular adenomas and diverticulosis  . DILATION AND CURETTAGE OF UTERUS  1970's   "probably 3" (12/09/2012)  . ERCP  2008  . ESOPHAGEAL DILATION  ` 2012  . ESOPHAGEAL MANOMETRY N/A 10/21/2015   Procedure: ESOPHAGEAL MANOMETRY (EM);  Surgeon: Doran Stabler, MD;  Location: [facility];  Service: Gastroenterology;  Laterality: N/A;  . ESOPHAGEAL MANOMETRY N/A 02/16/2018   Procedure: ESOPHAGEAL MANOMETRY (EM);  Surgeon: Mauri Pole, MD;  Location: [facility];  Service: Endoscopy;  Laterality: N/A;  . HUMERUS FRACTURE SURGERY Right 07/19/1969   "horst threw me" (12/09/2012)  . LEFT HEART CATH AND CORONARY ANGIOGRAPHY N/A 12/29/2017   Procedure: LEFT HEART CATH AND CORONARY ANGIOGRAPHY;  Surgeon: Wellington Hampshire, MD;  Location: [facility];  Service: Cardiovascular;  Laterality: N/A;  . NASAL SEPTUM SURGERY  1970's  . PERCUTANEOUS CORONARY STENT INTERVENTION (PCI-S) N/A 12/09/2012   Procedure: PERCUTANEOUS CORONARY STENT INTERVENTION (PCI-S);  Surgeon: Burnell Blanks, MD;  Location: [facility] CATH LAB;  Service: Cardiovascular;  Laterality: N/A;  . [place] IMPEDANCE STUDY N/A 02/16/2018   Procedure: [place] IMPEDANCE STUDY;  Surgeon: Mauri Pole, MD;  Location: [facility];  Service: Endoscopy;  Laterality: N/A;  . TOE FUSION Right 10/11   First MTP  joint  . TOE SURGERY Right 1990's   "toe next to big toe:  dr said I had tumor; cut bone & stuff; another OR dr stretched tendons, etc" (12/09/2012)  . TUBAL LIGATION  1985    Current Outpatient Medications  Medication Sig Dispense Refill  . albuterol (PROVENTIL HFA;VENTOLIN HFA) 108 (90 Base) MCG/ACT inhaler Inhale 2 puffs into the lungs every 4 (four) hours as needed.  3  . ALPRAZolam (XANAX) 1 MG tablet TAKE 1 TABLET BY MOUTH AT BEDTIME AS NEEDED FOR ANXIETY  1  . aspirin EC 81 MG tablet Take 81 mg by mouth daily.    Marland Kitchen. atorvastatin (LIPITOR) 20 MG tablet Take 20 mg by mouth daily.    Marland Kitchen. CARTIA  XT 120 MG 24 hr capsule TAKE ONE CAPSULE BY MOUTH EVERY MORNING 90 capsule 3  . cetirizine (ZYRTEC) 10 MG tablet Take 10 mg by mouth daily.    Marland Kitchen. dicyclomine (BENTYL) 10 MG capsule TAKE ONE CAPSULE BY MOUTH TWICE DAILY 20-30 MINUTES BEFORE BREAKFAST AND DINNER 180 capsule 2  . DULoxetine (CYMBALTA) 60 MG capsule Take 60 mg by mouth 2 (two) times daily.     Marland Kitchen. HYDROcodone-acetaminophen (NORCO/VICODIN) 5-325 MG tablet 1 tablet 2 (two) times daily.   0  . isosorbide mononitrate (IMDUR) 30 MG 24 hr tablet TAKE 1/2 TABLET BY MOUTH ONCE DAILY 45 tablet 1  . losartan-hydrochlorothiazide (HYZAAR) 100-25 MG tablet Take 1 tablet by mouth daily.    . metoprolol tartrate (LOPRESSOR) 25 MG tablet TAKE ONE TABLET BY MOUTH EVERY MORNING and TAKE ONE TABLET BY MOUTH EVERYDAY AT BEDTIME 180 tablet 3  . montelukast (SINGULAIR) 10 MG tablet Take 10 mg by mouth at bedtime.     . nitroGLYCERIN (NITROSTAT) 0.4 MG SL tablet Place 0.4 mg under the tongue every 5 (five) minutes x 3 doses as needed (if no relief after 3rd dose, proceed to the ED for an evaluation or call 911).     Marland Kitchen. omeprazole (PRILOSEC) 40 MG capsule Take 1 capsule (40 mg total) by mouth 2 (two) times daily. 180 capsule 3  . promethazine (PHENERGAN) 25 MG tablet Take 25 mg by mouth at bedtime. Takes prn and at times does 1/2 tablet instead of whole one    . traZODone (DESYREL) 50 MG tablet Take 200 mg by mouth at bedtime. 4 tabs hs    . TRESIBA FLEXTOUCH 200 UNIT/ML SOPN Inject 100 Units into the skin 2 (two) times daily as needed. As directed     No current facility-administered medications for this visit.   Allergies:  Erythromycin base, Azithromycin, Other, Pollen extract, Remeron [mirtazapine], Tree extract, Zolpidem tartrate, and Sulfonamide derivatives   ROS: No syncope.  Physical Exam: VS:  BP (!) 138/56   Pulse 62   Ht 5\' 2"  (1.575 m)   Wt 160 lb (72.6 kg)   SpO2 92%   BMI 29.26 kg/m , BMI Body mass index is 29.26 kg/m.  Wt Readings from  Last 3 Encounters:  08/01/20 160 lb (72.6 kg)  12/07/19 165 lb (74.8 kg)  06/21/19 160 lb 6.4 oz (72.8 kg)    General: Patient appears comfortable at rest. HEENT: Conjunctiva and lids normal, wearing a mask. Neck: Supple, no elevated JVP or carotid bruits, no thyromegaly. Lungs: Clear to auscultation, nonlabored breathing at rest. Cardiac: Regular rate and rhythm, no S3 or significant systolic murmur. Extremities: No pitting edema.  ECG:  An ECG dated 12/07/2019 was personally reviewed today and demonstrated:  Sinus bradycardia with  R' in lead V1 and V2.  Recent Labwork:    Component Value Date/Time   CHOL 160 12/29/2017 0258   TRIG 191 (H) 12/29/2017 0258   HDL 40 (L) 12/29/2017 0258   CHOLHDL 4.0 12/29/2017 0258   VLDL 38 12/29/2017 0258   LDLCALC 82 12/29/2017 0258  February 2020: Cholesterol 153, triglycerides 98, HDL 47, LDL 86  Other Studies Reviewed Today:  Cardiac catheterization 12/29/2017:  The left ventricular systolic function is normal.  LV end diastolic pressure is mildly elevated.  The left ventricular ejection fraction is 55-65% by visual estimate.  Mid RCA lesion is 40% stenosed.  Prox Cx lesion is 30% stenosed.  Ost 1st Diag lesion is 40% stenosed.  Previously placed Mid LAD stent (unknown type) is widely patent.  1. Widely patent mid LAD stent with no restenosis. Mild to moderate nonobstructive disease affecting the RCA and left circumflex. 2. Normal LV systolic function and mildly elevated left ventricular end-diastolic pressure.  Recommendations: I suspect noncardiac chest pain. Continue medical therapy.  Echocardiogram 12/28/2017: Study Conclusions  - Left ventricle: The cavity size was normal. Wall thickness was increased in a pattern of mild LVH. Systolic function was normal. The estimated ejection fraction was in the range of 60% to 65%. Wall motion was normal; there were no regional wall motion abnormalities. Doppler  parameters are consistent with abnormal left ventricular relaxation (grade 1 diastolic dysfunction). Doppler parameters are consistent with high ventricular filling pressure. - Aortic valve: Mildly to moderately calcified annulus. Trileaflet.  Assessment and Plan:  1. CAD status post DES to the LAD in 2014.  Stent site was patent as of 2019, and we have continued medical therapy and observation in the absence of accelerating angina.  Continue aspirin, Cartia XT, Lipitor, Imdur, Lopressor, and Hyzaar.  She has as needed nitroglycerin available.  2. Mixed hyperlipidemia on Lipitor.  Continue to follow-up with Dr. Quillian Quince.  Last LDL was 86.  Medication Adjustments/Labs and Tests Ordered: Current medicines are reviewed at length with the patient today.  Concerns regarding medicines are outlined above.   Tests Ordered: No orders of the defined types were placed in this encounter.   Medication Changes: No orders of the defined types were placed in this encounter.   Disposition:  Follow up 6 months in the [place] office.  Signed, [name], MD, [facility] 08/01/2020 11:07 AM    [place] [facility] [place], [place], [place] [address] Phone: [phone]; Fax: [phone]

## 2020-09-16 ENCOUNTER — Telehealth: Payer: Self-pay | Admitting: Cardiology

## 2020-09-16 NOTE — Telephone Encounter (Signed)
Attempt to reach, goes to machine that states VM not set up.

## 2020-09-16 NOTE — Telephone Encounter (Signed)
Patient is not on any medications that require a cardiology clearance, nor has she had any procedures that would prevent her from having dental procedures done. Patient has the okay to continue with dental procedure.

## 2020-09-16 NOTE — Telephone Encounter (Signed)
Patient was advised to call Dr. Domenic Polite to make sure it is ok for her to have 6 teeth pulled next week and in June to get dental implants. She said her Dentist is not requiring her to get Surgical Clearance but she wants Dr. Domenic Polite to know and to see what he suggests.She can be reached at [phone].

## 2020-10-26 ENCOUNTER — Other Ambulatory Visit: Payer: Self-pay | Admitting: Cardiology

## 2020-11-08 ENCOUNTER — Other Ambulatory Visit: Payer: Self-pay | Admitting: Gastroenterology

## 2021-02-26 ENCOUNTER — Telehealth: Payer: Self-pay | Admitting: Cardiology

## 2021-02-26 NOTE — Telephone Encounter (Signed)
   Covering for Dr. Domenic Polite - I agree with recommendations to proceed with emergent evaluation given her progressive symptoms and active symptoms today.  Signed, Erma Heritage, PA-C 02/26/2021, 5:05 PM

## 2021-02-26 NOTE — Telephone Encounter (Signed)
Pt c/o of Chest Pain: 1. Are you having CP right now? no 2. Are you experiencing any other symptoms (ex. SOB, nausea, vomiting, sweating)? All for the past month.  3. How long have you been experiencing CP? For over a month now.  4. Is your CP continuous or coming and going? Both  5. Have you taken Nitroglycerin? Been taking off and on for approximately a month    Patient states that these pains have been going on and off for a month now. She sweats at night and is cold during the day. She spoke to Dr. Quillian Quince about this but no test have been ordered. Appointment has been set for 8-22 with Dr. Domenic Polite.

## 2021-02-26 NOTE — Telephone Encounter (Signed)
Pt states that she has been having chest pain for the past month. Can not walk to the mailbox without being out of breath. Unable to do ADL's with out breaking for chest pain and SOB. Pt c/o nausea and chest pain today. Rates chest pain 6/10. Encouraged pt to be seen in ER today for evaluation.

## 2021-02-28 ENCOUNTER — Telehealth: Payer: Self-pay | Admitting: Cardiology

## 2021-02-28 NOTE — Telephone Encounter (Signed)
Advised to continue monitoring symptoms and if they get worse, to go back to the ED for an evaluation. Verbalized understanding

## 2021-02-28 NOTE — Telephone Encounter (Signed)
New Message    Patient went to [facility] for chest pain , they said that her hemoglobin is low. She went to Dr Gar Ponto office and had blood work done yesterday afternoon and she is waiting on the results, is there anything she should be doing this weekend until she can get in for check up?

## 2021-03-01 ENCOUNTER — Emergency Department (HOSPITAL_COMMUNITY): Payer: Medicare HMO

## 2021-03-01 ENCOUNTER — Inpatient Hospital Stay (HOSPITAL_COMMUNITY): Payer: Medicare HMO

## 2021-03-01 ENCOUNTER — Inpatient Hospital Stay (HOSPITAL_COMMUNITY)
Admission: EM | Admit: 2021-03-01 | Discharge: 2021-03-08 | DRG: 378 | Disposition: A | Discharge status: Home Health | Payer: Medicare HMO | Attending: Internal Medicine | Admitting: Internal Medicine

## 2021-03-01 ENCOUNTER — Encounter (HOSPITAL_COMMUNITY): Payer: Self-pay | Admitting: Emergency Medicine

## 2021-03-01 DIAGNOSIS — G4733 Obstructive sleep apnea (adult) (pediatric): Secondary | ICD-10-CM | POA: Diagnosis present

## 2021-03-01 DIAGNOSIS — Z794 Long term (current) use of insulin: Secondary | ICD-10-CM

## 2021-03-01 DIAGNOSIS — K2971 Gastritis, unspecified, with bleeding: Secondary | ICD-10-CM | POA: Diagnosis not present

## 2021-03-01 DIAGNOSIS — E119 Type 2 diabetes mellitus without complications: Secondary | ICD-10-CM | POA: Diagnosis present

## 2021-03-01 DIAGNOSIS — T451X5A Adverse effect of antineoplastic and immunosuppressive drugs, initial encounter: Secondary | ICD-10-CM | POA: Diagnosis not present

## 2021-03-01 DIAGNOSIS — D6481 Anemia due to antineoplastic chemotherapy: Secondary | ICD-10-CM | POA: Diagnosis not present

## 2021-03-01 DIAGNOSIS — K2970 Gastritis, unspecified, without bleeding: Secondary | ICD-10-CM | POA: Diagnosis not present

## 2021-03-01 DIAGNOSIS — I252 Old myocardial infarction: Secondary | ICD-10-CM

## 2021-03-01 DIAGNOSIS — R001 Bradycardia, unspecified: Secondary | ICD-10-CM | POA: Diagnosis present

## 2021-03-01 DIAGNOSIS — K219 Gastro-esophageal reflux disease without esophagitis: Secondary | ICD-10-CM | POA: Diagnosis present

## 2021-03-01 DIAGNOSIS — K254 Chronic or unspecified gastric ulcer with hemorrhage: Secondary | ICD-10-CM | POA: Diagnosis present

## 2021-03-01 DIAGNOSIS — R188 Other ascites: Secondary | ICD-10-CM | POA: Diagnosis present

## 2021-03-01 DIAGNOSIS — D649 Anemia, unspecified: Secondary | ICD-10-CM | POA: Diagnosis not present

## 2021-03-01 DIAGNOSIS — K2270 Barrett's esophagus without dysplasia: Secondary | ICD-10-CM | POA: Diagnosis present

## 2021-03-01 DIAGNOSIS — E8770 Fluid overload, unspecified: Secondary | ICD-10-CM | POA: Diagnosis present

## 2021-03-01 DIAGNOSIS — K921 Melena: Secondary | ICD-10-CM | POA: Diagnosis not present

## 2021-03-01 DIAGNOSIS — D509 Iron deficiency anemia, unspecified: Secondary | ICD-10-CM | POA: Diagnosis not present

## 2021-03-01 DIAGNOSIS — M79673 Pain in unspecified foot: Secondary | ICD-10-CM

## 2021-03-01 DIAGNOSIS — K922 Gastrointestinal hemorrhage, unspecified: Secondary | ICD-10-CM | POA: Diagnosis present

## 2021-03-01 DIAGNOSIS — Z20822 Contact with and (suspected) exposure to covid-19: Secondary | ICD-10-CM | POA: Diagnosis present

## 2021-03-01 DIAGNOSIS — K589 Irritable bowel syndrome without diarrhea: Secondary | ICD-10-CM | POA: Diagnosis present

## 2021-03-01 DIAGNOSIS — F39 Unspecified mood [affective] disorder: Secondary | ICD-10-CM | POA: Diagnosis present

## 2021-03-01 DIAGNOSIS — Z91048 Other nonmedicinal substance allergy status: Secondary | ICD-10-CM | POA: Diagnosis not present

## 2021-03-01 DIAGNOSIS — R109 Unspecified abdominal pain: Secondary | ICD-10-CM

## 2021-03-01 DIAGNOSIS — I8510 Secondary esophageal varices without bleeding: Secondary | ICD-10-CM | POA: Diagnosis present

## 2021-03-01 DIAGNOSIS — F419 Anxiety disorder, unspecified: Secondary | ICD-10-CM | POA: Diagnosis present

## 2021-03-01 DIAGNOSIS — Z79899 Other long term (current) drug therapy: Secondary | ICD-10-CM

## 2021-03-01 DIAGNOSIS — M810 Age-related osteoporosis without current pathological fracture: Secondary | ICD-10-CM | POA: Diagnosis present

## 2021-03-01 DIAGNOSIS — K766 Portal hypertension: Secondary | ICD-10-CM | POA: Diagnosis present

## 2021-03-01 DIAGNOSIS — R0602 Shortness of breath: Secondary | ICD-10-CM

## 2021-03-01 DIAGNOSIS — K5732 Diverticulitis of large intestine without perforation or abscess without bleeding: Secondary | ICD-10-CM

## 2021-03-01 DIAGNOSIS — Z882 Allergy status to sulfonamides status: Secondary | ICD-10-CM

## 2021-03-01 DIAGNOSIS — Z955 Presence of coronary angioplasty implant and graft: Secondary | ICD-10-CM

## 2021-03-01 DIAGNOSIS — M797 Fibromyalgia: Secondary | ICD-10-CM | POA: Diagnosis present

## 2021-03-01 DIAGNOSIS — Z8249 Family history of ischemic heart disease and other diseases of the circulatory system: Secondary | ICD-10-CM

## 2021-03-01 DIAGNOSIS — D500 Iron deficiency anemia secondary to blood loss (chronic): Secondary | ICD-10-CM

## 2021-03-01 DIAGNOSIS — Z87891 Personal history of nicotine dependence: Secondary | ICD-10-CM

## 2021-03-01 DIAGNOSIS — Z7982 Long term (current) use of aspirin: Secondary | ICD-10-CM

## 2021-03-01 DIAGNOSIS — E782 Mixed hyperlipidemia: Secondary | ICD-10-CM | POA: Diagnosis present

## 2021-03-01 DIAGNOSIS — R079 Chest pain, unspecified: Secondary | ICD-10-CM | POA: Diagnosis not present

## 2021-03-01 DIAGNOSIS — I10 Essential (primary) hypertension: Secondary | ICD-10-CM | POA: Diagnosis present

## 2021-03-01 DIAGNOSIS — D62 Acute posthemorrhagic anemia: Secondary | ICD-10-CM | POA: Diagnosis present

## 2021-03-01 DIAGNOSIS — Z881 Allergy status to other antibiotic agents status: Secondary | ICD-10-CM | POA: Diagnosis not present

## 2021-03-01 DIAGNOSIS — K7460 Unspecified cirrhosis of liver: Secondary | ICD-10-CM | POA: Diagnosis present

## 2021-03-01 DIAGNOSIS — I25119 Atherosclerotic heart disease of native coronary artery with unspecified angina pectoris: Secondary | ICD-10-CM | POA: Diagnosis present

## 2021-03-01 DIAGNOSIS — I2510 Atherosclerotic heart disease of native coronary artery without angina pectoris: Secondary | ICD-10-CM | POA: Diagnosis not present

## 2021-03-01 DIAGNOSIS — K2990 Gastroduodenitis, unspecified, without bleeding: Secondary | ICD-10-CM | POA: Diagnosis not present

## 2021-03-01 LAB — IRON AND TIBC
Iron: 26 ug/dL — ABNORMAL LOW (ref 28–170)
Saturation Ratios: 4 % — ABNORMAL LOW (ref 10.4–31.8)
TIBC: 613 ug/dL — ABNORMAL HIGH (ref 250–450)
UIBC: 587 ug/dL

## 2021-03-01 LAB — CBC
HCT: 25.5 % — ABNORMAL LOW (ref 36.0–46.0)
Hemoglobin: 7.4 g/dL — ABNORMAL LOW (ref 12.0–15.0)
MCH: 23.9 pg — ABNORMAL LOW (ref 26.0–34.0)
MCHC: 29 g/dL — ABNORMAL LOW (ref 30.0–36.0)
MCV: 82.3 fL (ref 80.0–100.0)
Platelets: 319 10*3/uL (ref 150–400)
RBC: 3.1 MIL/uL — ABNORMAL LOW (ref 3.87–5.11)
RDW: 16.5 % — ABNORMAL HIGH (ref 11.5–15.5)
WBC: 14.2 10*3/uL — ABNORMAL HIGH (ref 4.0–10.5)
nRBC: 0.1 % (ref 0.0–0.2)

## 2021-03-01 LAB — RETICULOCYTES
Immature Retic Fract: 37.6 % — ABNORMAL HIGH (ref 2.3–15.9)
RBC.: 3.74 MIL/uL — ABNORMAL LOW (ref 3.87–5.11)
Retic Count, Absolute: 82.7 10*3/uL (ref 19.0–186.0)
Retic Ct Pct: 2.2 % (ref 0.4–3.1)

## 2021-03-01 LAB — CBG MONITORING, ED
Glucose-Capillary: 185 mg/dL — ABNORMAL HIGH (ref 70–99)
Glucose-Capillary: 161 mg/dL — ABNORMAL HIGH (ref 70–99)
Glucose-Capillary: 169 mg/dL — ABNORMAL HIGH (ref 70–99)

## 2021-03-01 LAB — POC OCCULT BLOOD, ED: Fecal Occult Bld: NEGATIVE

## 2021-03-01 LAB — COMPREHENSIVE METABOLIC PANEL
ALT: 17 U/L (ref 0–44)
AST: 22 U/L (ref 15–41)
Albumin: 3.4 g/dL — ABNORMAL LOW (ref 3.5–5.0)
Alkaline Phosphatase: 81 U/L (ref 38–126)
Anion gap: 6 (ref 5–15)
BUN: 12 mg/dL (ref 8–23)
CO2: 26 mmol/L (ref 22–32)
Calcium: 9.7 mg/dL (ref 8.9–10.3)
Chloride: 100 mmol/L (ref 98–111)
Creatinine, Ser: 0.81 mg/dL (ref 0.44–1.00)
GFR, Estimated: >60 mL/min (ref >60)
Glucose, Bld: 311 mg/dL — ABNORMAL HIGH (ref 70–99)
Potassium: 4.7 mmol/L (ref 3.5–5.1)
Sodium: 132 mmol/L — ABNORMAL LOW (ref 135–145)
Total Bilirubin: 0.3 mg/dL (ref 0.3–1.2)
Total Protein: 6.5 g/dL (ref 6.5–8.1)

## 2021-03-01 LAB — VITAMIN B12: Vitamin B-12: 826 pg/mL (ref 180–914)

## 2021-03-01 LAB — PROTIME-INR
INR: 1.1 (ref 0.8–1.2)
Prothrombin Time: 13.9 seconds (ref 11.4–15.2)

## 2021-03-01 LAB — HEMOGLOBIN A1C
Hgb A1c MFr Bld: 7.6 % — ABNORMAL HIGH (ref 4.8–5.6)
Mean Plasma Glucose: 171.42 mg/dL

## 2021-03-01 LAB — TROPONIN I (HIGH SENSITIVITY)
Troponin I (High Sensitivity): 14 ng/L (ref <18)
Troponin I (High Sensitivity): 14 ng/L (ref <18)

## 2021-03-01 LAB — PREPARE RBC (CROSSMATCH)

## 2021-03-01 LAB — FERRITIN: Ferritin: 14 ng/mL (ref 11–307)

## 2021-03-01 LAB — FOLATE: Folate: 23.4 ng/mL (ref >5.9)

## 2021-03-01 LAB — ABO/RH: ABO/RH(D): O POS

## 2021-03-01 MED ORDER — PANTOPRAZOLE SODIUM 40 MG PO TBEC: 40.0000 mg | DELAYED_RELEASE_TABLET | Freq: Every day | ORAL | Status: DC

## 2021-03-01 MED ORDER — ISOSORBIDE MONONITRATE ER 30 MG PO TB24
15.0000 mg | ORAL_TABLET | Freq: Every day | ORAL | Status: DC
  Administered 2021-03-02 – 2021-03-03 (×2): 15 mg via ORAL
  Filled 2021-03-01 (×2): qty 1

## 2021-03-01 MED ORDER — ASPIRIN EC 81 MG PO TBEC: 81.0000 mg | DELAYED_RELEASE_TABLET | Freq: Every day | ORAL | Status: DC

## 2021-03-01 MED ORDER — PANTOPRAZOLE 80MG IVPB - SIMPLE MED
80.0000 mg | Freq: Once | INTRAVENOUS | Status: AC
  Administered 2021-03-02: 80 mg via INTRAVENOUS
  Filled 2021-03-01: qty 80
  Filled 2021-03-01: qty 100

## 2021-03-01 MED ORDER — INSULIN ASPART 100 UNIT/ML IJ SOLN
0.0000 [IU] | Freq: Three times a day (TID) | INTRAMUSCULAR | Status: DC
Start: 2021-03-01 — End: 2021-03-02
  Administered 2021-03-02: 4 [IU] via SUBCUTANEOUS

## 2021-03-01 MED ORDER — METRONIDAZOLE 500 MG/100ML IV SOLN
500.0000 mg | Freq: Three times a day (TID) | INTRAVENOUS | Status: DC
  Administered 2021-03-02 – 2021-03-04 (×7): 500 mg via INTRAVENOUS
  Filled 2021-03-01 (×8): qty 100

## 2021-03-01 MED ORDER — METOPROLOL TARTRATE 5 MG/5ML IV SOLN: 2.5000 mg | Freq: Four times a day (QID) | INTRAVENOUS | Status: DC | PRN

## 2021-03-01 MED ORDER — HYDROCODONE-ACETAMINOPHEN 5-325 MG PO TABS
1.0000 | ORAL_TABLET | Freq: Two times a day (BID) | ORAL | Status: DC
  Administered 2021-03-02 – 2021-03-03 (×4): 1 via ORAL
  Filled 2021-03-01 (×4): qty 1

## 2021-03-01 MED ORDER — PANTOPRAZOLE SODIUM 40 MG IV SOLR: 40.0000 mg | Freq: Two times a day (BID) | INTRAVENOUS | Status: DC

## 2021-03-01 MED ORDER — LOSARTAN POTASSIUM 50 MG PO TABS: 100.0000 mg | ORAL_TABLET | Freq: Every day | ORAL | Status: DC

## 2021-03-01 MED ORDER — MONTELUKAST SODIUM 10 MG PO TABS
10.0000 mg | ORAL_TABLET | Freq: Every day | ORAL | Status: DC
  Administered 2021-03-02 – 2021-03-07 (×7): 10 mg via ORAL
  Filled 2021-03-01 (×7): qty 1

## 2021-03-01 MED ORDER — PANTOPRAZOLE INFUSION (NEW) - SIMPLE MED
8.0000 mg/h | INTRAVENOUS | Status: DC
  Administered 2021-03-02: 8 mg/h via INTRAVENOUS
  Filled 2021-03-01: qty 100
  Filled 2021-03-01: qty 80
  Filled 2021-03-01: qty 100

## 2021-03-01 MED ORDER — ATORVASTATIN CALCIUM 10 MG PO TABS
20.0000 mg | ORAL_TABLET | Freq: Every day | ORAL | Status: DC
  Administered 2021-03-02: 20 mg via ORAL
  Filled 2021-03-01: qty 2

## 2021-03-01 MED ORDER — DILTIAZEM HCL ER COATED BEADS 120 MG PO CP24: 120.0000 mg | ORAL_CAPSULE | Freq: Every morning | ORAL | Status: DC

## 2021-03-01 MED ORDER — LORATADINE 10 MG PO TABS
10.0000 mg | ORAL_TABLET | Freq: Every day | ORAL | Status: DC
  Administered 2021-03-02: 10 mg via ORAL
  Filled 2021-03-01: qty 1

## 2021-03-01 MED ORDER — DULOXETINE HCL 60 MG PO CPEP
60.0000 mg | ORAL_CAPSULE | Freq: Two times a day (BID) | ORAL | Status: DC
  Administered 2021-03-02 – 2021-03-08 (×14): 60 mg via ORAL
  Filled 2021-03-01 (×14): qty 1

## 2021-03-01 MED ORDER — ALBUTEROL SULFATE HFA 108 (90 BASE) MCG/ACT IN AERS
2.0000 | INHALATION_SPRAY | RESPIRATORY_TRACT | Status: DC | PRN
  Administered 2021-03-05: 2 via RESPIRATORY_TRACT
  Filled 2021-03-01: qty 6.7

## 2021-03-01 MED ORDER — ALPRAZOLAM 0.5 MG PO TABS
1.0000 mg | ORAL_TABLET | Freq: Every day | ORAL | Status: DC
  Administered 2021-03-02 (×2): 1 mg via ORAL
  Filled 2021-03-01: qty 4
  Filled 2021-03-01: qty 2

## 2021-03-01 MED ORDER — DICYCLOMINE HCL 10 MG PO CAPS: 10.0000 mg | ORAL_CAPSULE | Freq: Three times a day (TID) | ORAL | Status: DC | PRN

## 2021-03-01 MED ORDER — SODIUM CHLORIDE 0.9 % IV SOLN
10.0000 mL/h | Freq: Once | INTRAVENOUS | Status: AC
  Administered 2021-03-01: 10 mL/h via INTRAVENOUS

## 2021-03-01 MED ORDER — SODIUM CHLORIDE 0.9 % IV SOLN
2.0000 g | INTRAVENOUS | Status: DC
  Administered 2021-03-02 – 2021-03-03 (×3): 2 g via INTRAVENOUS
  Filled 2021-03-01 (×5): qty 20

## 2021-03-01 MED ORDER — IOHEXOL 350 MG/ML SOLN
75.0000 mL | Freq: Once | INTRAVENOUS | Status: AC | PRN
  Administered 2021-03-01: 75 mL via INTRAVENOUS

## 2021-03-01 MED ORDER — METOPROLOL TARTRATE 25 MG PO TABS: 25.0000 mg | ORAL_TABLET | Freq: Two times a day (BID) | ORAL | Status: DC

## 2021-03-01 MED ORDER — PROMETHAZINE HCL 25 MG PO TABS: 25.0000 mg | ORAL_TABLET | Freq: Every evening | ORAL | Status: DC | PRN

## 2021-03-01 MED ORDER — HYDRALAZINE HCL 25 MG PO TABS: 25.0000 mg | ORAL_TABLET | Freq: Four times a day (QID) | ORAL | Status: DC | PRN

## 2021-03-01 MED ORDER — DILTIAZEM HCL 30 MG PO TABS
30.0000 mg | ORAL_TABLET | Freq: Three times a day (TID) | ORAL | Status: DC
  Filled 2021-03-01 (×3): qty 1

## 2021-03-01 MED ORDER — LOSARTAN POTASSIUM-HCTZ 100-25 MG PO TABS: 1.0000 | ORAL_TABLET | Freq: Every day | ORAL | Status: DC

## 2021-03-01 MED ORDER — HYDROCHLOROTHIAZIDE 25 MG PO TABS: 25.0000 mg | ORAL_TABLET | Freq: Every day | ORAL | Status: DC

## 2021-03-01 MED ORDER — SODIUM CHLORIDE 0.9 % IV SOLN
50.0000 ug/h | INTRAVENOUS | Status: DC
  Administered 2021-03-02 (×2): 50 ug/h via INTRAVENOUS
  Filled 2021-03-01 (×2): qty 1

## 2021-03-01 MED ORDER — TRAZODONE HCL 50 MG PO TABS
200.0000 mg | ORAL_TABLET | Freq: Every day | ORAL | Status: DC
  Administered 2021-03-02 – 2021-03-07 (×7): 200 mg via ORAL
  Filled 2021-03-01 (×7): qty 4

## 2021-03-01 MED ORDER — FERROUS SULFATE 325 (65 FE) MG PO TABS
325.0000 mg | ORAL_TABLET | Freq: Two times a day (BID) | ORAL | Status: DC
  Administered 2021-03-02: 325 mg via ORAL
  Filled 2021-03-01: qty 1

## 2021-03-01 MED ORDER — INSULIN GLARGINE-YFGN 100 UNIT/ML ~~LOC~~ SOLN
15.0000 [IU] | Freq: Every day | SUBCUTANEOUS | Status: DC
  Filled 2021-03-01 (×2): qty 0.15

## 2021-03-01 NOTE — ED Triage Notes (Signed)
Pt reports blood in stool, generalized abd pain, chest pain, and SOB x 1 month.  Seen at [facility] ED on Wednesday and told Hgb was 7.  Followed up with PCP and had labs and received call today that Hgb was lower than Wednesday.  Pt pale.

## 2021-03-01 NOTE — H&P (Signed)
History and Physical    [name] [id number] DOB: 06/07/1951 DOA: 03/01/2021  PCP: Caryl Bis, MD (Confirm with patient/family/NH records and if not entered, this has to be entered at [facility] point of entry) Patient coming from: Home  I have personally briefly reviewed patient's old medical records in [place]  Chief Complaint: SOB  HPI: [name] is a 70 y.o. female with medical history significant of diverticulosis and colon polyps, CAD with stenting, HTN, IDDM,Barrett esophagus, IBS, OSA on CPAP presented with persistent abdominal pain and diarrhea, dark-colored stool, and increasing shortness of breath.  Patient started to have diarrhea about 7 to 8 weeks ago, 3-4 times a day along with abdomen cramping, no fever or chills.  Abdominal pain mainly on bilateral lower quadrants right> left, episodic, also she did admit there was tenesmus.  Intermittently, she also noticed dark-colored stool, and diarrhea has been loose in nature.  She contacted her GI doctor who ordered Bentyl.  But patient reported Bentyl did very little to improve abdominal pain with diarrhea.  Starting 4 to 5 weeks ago, she developed exertional dyspnea, gradually getting worse.  This week, minimum activity can trigger significant shortness of breath.  Husband told her " you looked pale", and patient went to [facility] 3 days ago and was found hemoglobin 7.6.  She denied any nausea vomiting fever chills chest pains.  ED Course: Hemoglobin 7.4, WBC 14.2.  PRBC x1 ordered in ED.  Patient was started on PPI twice daily and GI consulted.  Review of Systems: As per HPI otherwise 14 point review of systems negative.    Past Medical History:  Diagnosis Date   Anxiety    Arthritis    Asthma    Barrett esophagus    Chronic bronchitis (HCC)    Chronic diarrhea    Chronic lower back pain    Coronary atherosclerosis of native coronary artery    DES to LAD May 2014   Diverticulosis    Essential  hypertension    Fatty liver    Fibromyalgia    GERD (gastroesophageal reflux disease)    H/O hiatal hernia    IBS (irritable bowel syndrome)    Major depression in partial remission ([place])    Migraines    Mixed hyperlipidemia    Myocardial infarction ([place])    2014   OSA on CPAP    Osteoporosis    Pancreatitis 2008   Pneumonia 1990's   Schatzki's ring    Sleep apnea    Type 2 diabetes mellitus ([place])     Past Surgical History:  Procedure Laterality Date   70 HOUR [place] STUDY N/A 02/16/2018   Procedure: 24 HOUR PH STUDY;  Surgeon: Mauri Pole, MD;  Location: [facility];  Service: Endoscopy;  Laterality: N/A;   ABDOMINAL HYSTERECTOMY  ?1987   APPENDECTOMY  ?1987   BUNIONECTOMY Bilateral 1972   CARPAL TUNNEL RELEASE Bilateral ?1990   CATARACT EXTRACTION W/ INTRAOCULAR LENS  IMPLANT, BILATERAL Bilateral ?2005   CESAREAN SECTION  1985   CHOLECYSTECTOMY  1990'S   COLONOSCOPY  10/13   Dr. Britta Mccreedy - tubular adenomas and diverticulosis   DILATION AND CURETTAGE OF UTERUS  1970's   "probably 3" (12/09/2012)   ERCP  2008   ESOPHAGEAL DILATION  ` 2012   ESOPHAGEAL MANOMETRY N/A 10/21/2015   Procedure: ESOPHAGEAL MANOMETRY (EM);  Surgeon: Doran Stabler, MD;  Location: [facility];  Service: Gastroenterology;  Laterality: N/A;   ESOPHAGEAL MANOMETRY N/A  02/16/2018   Procedure: ESOPHAGEAL MANOMETRY (EM);  Surgeon: Mauri Pole, MD;  Location: [facility];  Service: Endoscopy;  Laterality: N/A;   HUMERUS FRACTURE SURGERY Right 07/19/1969   "horst threw me" (12/09/2012)   LEFT HEART CATH AND CORONARY ANGIOGRAPHY N/A 12/29/2017   Procedure: LEFT HEART CATH AND CORONARY ANGIOGRAPHY;  Surgeon: Wellington Hampshire, MD;  Location: [place] [facility];  Service: Cardiovascular;  Laterality: N/A;   NASAL SEPTUM SURGERY  1970's   PERCUTANEOUS CORONARY STENT INTERVENTION (PCI-S) N/A 12/09/2012   Procedure: PERCUTANEOUS CORONARY STENT INTERVENTION (PCI-S);  Surgeon: Burnell Blanks,  MD;  Location: [facility] CATH LAB;  Service: Cardiovascular;  Laterality: N/A;   [place] IMPEDANCE STUDY N/A 02/16/2018   Procedure: [place] IMPEDANCE STUDY;  Surgeon: Mauri Pole, MD;  Location: [facility];  Service: Endoscopy;  Laterality: N/A;   TOE FUSION Right 10/11   First MTP joint   TOE SURGERY Right 1990's   "toe next to big toe:  dr said I had tumor; cut bone & stuff; another OR dr stretched tendons, etc" (12/09/2012)   [place]     reports that she quit smoking about 27 years ago. Her smoking use included cigarettes. She started smoking about 44 years ago. She has a 12.00 pack-year smoking history. She has never used smokeless tobacco. She reports that she does not drink alcohol and does not use drugs.  Allergies  Allergen Reactions   Erythromycin Base Diarrhea   Azithromycin Other (See Comments)    Upset stomach    Pollen Extract Cough    Runny nose   Remeron [Mirtazapine] Other (See Comments)    Makes her too sleepy   Zolpidem Tartrate Other (See Comments)    amnesia   Other Rash and Other (See Comments)    Grass, dust, green beans, corn, cabbage Reaction: upset stomach   Sulfonamide Derivatives Rash    Mouth sores   Tree Extract Rash    Family History  Problem Relation Age of Onset   Stroke Mother    Cirrhosis Father    Hypertension Father    Esophageal cancer Sister    Colon polyps Sister        pre-cancerous, part of colon removed   Prostate cancer Brother    Colon cancer Neg Hx    Rectal cancer Neg Hx    Stomach cancer Neg Hx      Prior to Admission medications   Medication Sig Start Date End Date Taking? Authorizing Provider  albuterol (PROVENTIL HFA;VENTOLIN HFA) 108 (90 Base) MCG/ACT inhaler Inhale 2 puffs into the lungs every 4 (four) hours as needed. 12/15/17  Yes [provider]  ALPRAZolam [name]) 1 MG tablet Take 1 mg by mouth at bedtime. 10/22/15  Yes [provider]  aspirin EC 81 MG tablet Take 81 mg by mouth daily.    Yes [provider]  atorvastatin (LIPITOR) 20 MG tablet Take 20 mg by mouth daily.   Yes [provider]  CARTIA XT 120 MG 24 hr capsule TAKE ONE CAPSULE BY MOUTH EVERY MORNING Patient taking differently: Take 120 mg by mouth daily. 07/25/20  Yes [name], MD  cetirizine (ZYRTEC) 10 MG tablet Take 10 mg by mouth daily as needed for allergies.   Yes [provider]  dicyclomine (BENTYL) 10 MG capsule TAKE ONE CAPSULE BY MOUTH TWICE DAILY 20-30 MINUTES BEFORE BREAKFAST AND DINNER Patient taking differently: Take 10 mg by mouth 3 (three) times daily as needed for  spasms. 10/16/19  Yes Levin Erp, PA  DULoxetine (CYMBALTA) 60 MG capsule Take 60 mg by mouth 2 (two) times daily.  08/15/15  Yes [provider]  FEROSUL 325 (65 Fe) MG tablet Take 325 mg by mouth 2 (two) times daily. 02/27/21  Yes [provider]  HYDROcodone-acetaminophen (NORCO/VICODIN) 5-325 MG tablet Take 1 tablet by mouth 2 (two) times daily. 10/22/15  Yes [provider]  isosorbide mononitrate (IMDUR) 30 MG 24 hr tablet TAKE 1/2 TABLET BY MOUTH ONCE DAILY 10/28/20  Yes [name], MD  losartan-hydrochlorothiazide (HYZAAR) 100-25 MG tablet Take 1 tablet by mouth daily. 04/11/19  Yes [provider]  metoprolol tartrate (LOPRESSOR) 25 MG tablet TAKE ONE TABLET BY MOUTH EVERY MORNING and TAKE ONE TABLET BY MOUTH EVERYDAY AT BEDTIME 07/25/20  Yes [name], MD  montelukast (SINGULAIR) 10 MG tablet Take 10 mg by mouth at bedtime.  08/15/15  Yes [provider]  nitroGLYCERIN (NITROSTAT) 0.4 MG SL tablet Place 0.4 mg under the tongue every 5 (five) minutes x 3 doses as needed (if no relief after 3rd dose, proceed to the ED for an evaluation or call 911).    Yes [provider]  NOVOLOG FLEXPEN 100 UNIT/ML FlexPen Inject 0-30 Units into the skin in the morning and at bedtime. 12/31/20  Yes [provider]  omeprazole (PRILOSEC)  40 MG capsule TAKE ONE CAPSULE BY MOUTH twice A DAY Patient taking differently: Take 40 mg by mouth in the morning and at bedtime. 11/08/20  Yes Danis, Kirke Corin, MD  promethazine (PHENERGAN) 25 MG tablet Take 25 mg by mouth at bedtime. Takes prn and at times does 1/2 tablet instead of whole one   Yes [provider]  traZODone (DESYREL) 50 MG tablet Take 100-150 mg by mouth at bedtime. 4 tabs hs 08/15/15  Yes [provider]  TRESIBA FLEXTOUCH 200 UNIT/ML SOPN Inject 100 Units into the skin 2 (two) times daily as needed (blood sugar level). As directed 10/28/15  Yes [provider]    Physical Exam: Vitals:   03/01/21 1500 03/01/21 1530 03/01/21 1624 03/01/21 1641  BP: (!) 141/52 (!) 138/52 (!) 149/51 (!) 145/51  Pulse: (!) 59 (!) 58 70 69  Resp: '20 18 15 19  '$ Temp:   99 F (37.2 C) 99.7 F (37.6 C)  TempSrc:   Oral Oral  SpO2: 97% 95% 98% 97%  Weight:      Height:        Constitutional: NAD, calm, comfortable Vitals:   03/01/21 1500 03/01/21 1530 03/01/21 1624 03/01/21 1641  BP: (!) 141/52 (!) 138/52 (!) 149/51 (!) 145/51  Pulse: (!) 59 (!) 58 70 69  Resp: '20 18 15 19  '$ Temp:   99 F (37.2 C) 99.7 F (37.6 C)  TempSrc:   Oral Oral  SpO2: 97% 95% 98% 97%  Weight:      Height:       Eyes: PERRL, lids and conjunctivae normal ENMT: Mucous membranes are moist. Posterior pharynx clear of any exudate or lesions.Normal dentition.  Neck: normal, supple, no masses, no thyromegaly Respiratory: clear to auscultation bilaterally, no wheezing, no crackles. Normal respiratory effort. No accessory muscle use.  Cardiovascular: Regular rate and rhythm, no murmurs / rubs / gallops. No extremity edema. 2+ pedal pulses. No carotid bruits.  Abdomen: tenderness on right lower quadrant, no rebound no guarding, no masses palpated. No hepatosplenomegaly. Bowel sounds positive.  Musculoskeletal: no clubbing / cyanosis. No joint  deformity upper and lower extremities. Good ROM,  no contractures. Normal muscle tone.  Skin: no rashes, lesions, ulcers. No induration Neurologic: CN 2-12 grossly intact. Sensation intact, DTR normal. Strength 5/5 in all 4.  Psychiatric: Normal judgment and insight. Alert and oriented x 3. Normal mood.     Labs on Admission: I have personally reviewed following labs and imaging studies  CBC: Recent Labs  Lab 03/01/21 1123  WBC 14.2*  HGB 7.4*  HCT 25.5*  MCV 82.3  PLT 99991111   Basic Metabolic Panel: Recent Labs  Lab 03/01/21 1123  NA 132*  K 4.7  CL 100  CO2 26  GLUCOSE 311*  BUN 12  CREATININE 0.81  CALCIUM 9.7   GFR: Estimated Creatinine Clearance: 57.6 mL/min (by C-G formula based on SCr of 0.81 mg/dL). Liver Function Tests: Recent Labs  Lab 03/01/21 1123  AST 22  ALT 17  ALKPHOS 81  BILITOT 0.3  PROT 6.5  ALBUMIN 3.4*   No results for input(s): LIPASE, AMYLASE in the last 168 hours. No results for input(s): AMMONIA in the last 168 hours. Coagulation Profile: No results for input(s): INR, PROTIME in the last 168 hours. Cardiac Enzymes: No results for input(s): CKTOTAL, CKMB, CKMBINDEX, TROPONINI in the last 168 hours. BNP (last 3 results) No results for input(s): PROBNP in the last 8760 hours. HbA1C: No results for input(s): HGBA1C in the last 72 hours. CBG: No results for input(s): GLUCAP in the last 168 hours. Lipid Profile: No results for input(s): CHOL, HDL, LDLCALC, TRIG, CHOLHDL, LDLDIRECT in the last 72 hours. Thyroid Function Tests: No results for input(s): TSH, T4TOTAL, FREET4, T3FREE, THYROIDAB in the last 72 hours. Anemia Panel: No results for input(s): VITAMINB12, FOLATE, FERRITIN, TIBC, IRON, RETICCTPCT in the last 72 hours. Urine analysis:    Component Value Date/Time   COLORURINE YELLOW 04/13/2013 1507   APPEARANCEUR CLOUDY (A) 04/13/2013 1507   LABSPEC 1.017 04/13/2013 1507   PHURINE 7.5 04/13/2013 1507   GLUCOSEU NEGATIVE 04/13/2013 1507   HGBUR NEGATIVE 04/13/2013 1507    BILIRUBINUR NEGATIVE 04/13/2013 1507   KETONESUR NEGATIVE 04/13/2013 1507   PROTEINUR NEGATIVE 04/13/2013 1507   UROBILINOGEN 1.0 04/13/2013 1507   NITRITE NEGATIVE 04/13/2013 1507   LEUKOCYTESUR MODERATE (A) 04/13/2013 1507    Radiological Exams on Admission: DG Chest 2 View  Result Date: 03/01/2021 CLINICAL DATA:  Chest pain, shortness of breath. EXAM: CHEST - 2 VIEW COMPARISON:  Chest x-rays dated 02/26/2021 and 05/02/2014. FINDINGS: Heart size and mediastinal contours are stable. Coarse interstitial markings bilaterally, perihilar and lower lobe predominant. No confluent opacity to suggest a superimposed pneumonia. No pleural effusion or pneumothorax is seen. No acute-appearing osseous abnormality. Aortic atherosclerosis. IMPRESSION: 1. No active cardiopulmonary disease. No evidence of pneumonia or pulmonary edema. 2. Coarse interstitial lung markings bilaterally, perihilar and lower lobe predominant, suggesting chronic bronchitic change and/or chronic interstitial lung disease. 3. Aortic atherosclerosis. Electronically Signed   By: Franki Cabot M.D.   On: 03/01/2021 12:34    EKG: Independently reviewed.  Sinus bradycardia and chronic incomplete RBBB  Assessment/Plan Active Problems:   Anemia associated with chemotherapy   GI bleed  (please populate well all problems here in Problem List. (For example, if patient is on BP meds at home and you resume or decide to hold them, it is a problem that needs to be her. Same for CAD, COPD, HLD and so on)   Symptomatic anemia -Likely secondary to lower GI bleed, suspect diverticulosis, also suspect diverticulitis, CT abdomen  pending. -[facility] consulted. -PRBC x1 and repeat H/H tonight. -Anemia panel.  Lower GI bleed -CT abdomen reviewed, two prominent GI findings one is diverticulitis and incidental finding of liver cirrhosis as well as distal esophagus varices.  -For colitis, start antibiotics ceftriaxone and Flagyl -For symptoms and  finding of varices and cirrhosis, consult GI for Sandostatin.  However noticed patient very in bradycardia, will discontinue beta-blocker and Cardizem, use as needed BP meds for now. -Discussed with on-call [facility] Dr. Bryan Lemma, recommend starting Sandostatin drip.  INR within normal limits 2 days ago.,  Will recheck.  HTN -Discontinue HCTZ, ARB, beta-blocker and Cardizem, use as needed hydralazine and as needed metoprolol.  IDDM -Cut down her long-acting insulin and start sliding scale.   DVT prophylaxis: SCD Code Status: Full code Family Communication: None at bedside Disposition Plan: Expect more than 2 midnight hospital stay Consults called: [facility] Admission status: PCU for Sandostatin drip.   Lequita Halt MD [facility] Pager [phone]  03/01/2021, 4:54 PM

## 2021-03-01 NOTE — Consult Note (Signed)
Consultation  Referring Provider:     Lequita Halt, MD Primary Care Physician:  Caryl Bis, MD Primary Gastroenterologist:     Dr. [name]    Reason for Consultation:     Melena, symptomatic anemia         HPI:   [name] is a 70 y.o. female with a medical history including HTN, OSA (on CPAP), diabetes, IBS, esophageal spasm, migraines, HLD, CAD with DES in 2014, GERD, Schatzki's ring, post ERCP pancreatitis in 2008, osteoporosis, presenting to the ER with DOE along with melena.  She reports having melena, "foul-smelling stools", and increased stool frequency for the last month or so.  Takes ASA 81 mg/day, otherwise no NSAIDs.  Has been having night sweats for the last year, and reports a 10 pound weight loss over the last month or so.  No prior known history of GI bleed.  She was seen by her PCM with reportedly FOBT positive stool in the last week or so.    Evaluated at [facility] on 02/26/2021: - H/H was 7.6/25.2 with MCV/RDW 79/24 - WC 11.9, PLT 295 - BUN/creatinine 9/0.8 - INR 0.9  Most recent comparison labs available for review were 04/19/2019: - H/H 14.8/43.7 with MCV/RDW 98/12.5  She has had several upper endoscopies over the years, with most recent in 05/2019 and notable for the Schatzki's ring, small HH, and ?gastritis but no PUD.  - Esophageal Manometry (01/2018): Elevated DCI consistent with distal esophageal spasm - pH/impedance (01/2018, off PPI): No abnormal esophageal acid exposure - Esophagram (05/2019): Smooth narrowing/stricture at level of distal esophagus measuring 9 mm.  Barium tablet stuck and did not pass.  Mild associated esophageal dysmotility.  Has had at least 3 colonoscopies since 2010, with most recent in 11/2014: Severe sigmoid diverticulosis, internal hemorrhoids.  Otherwise normal.  Repeat in 10 years  ER evaluation today notable for the following: - H/H 7.4/25.5 with MCV/RDW 82/16.5 - WBC 14.2, PLT 319 - BUN/creatinine 12/0.8 - Albumin  3.4, otherwise normal liver enzymes - Troponin normal x2 - FOBT negative - CT abdomen/pelvis: Hepatic cirrhosis with decreased hepatic density, nodular contours.  Hypertrophy right lobe.  Trace ascites.  CBD 12 mm and prior cholecystectomy.  Normal pancreas and PD.  Distal esophageal wall thickening with distal esophageal varices.  Periampullary duodenal diverticulum.  Distal descending and sigmoid colon diverticulosis with faint pericolonic edema and LLQ adjacent to proximal sigmoid colon, may represent diverticulitis though inflamed diverticulum not seen.  No perforation or abscess.   Past Medical History:  Diagnosis Date   Anxiety    Arthritis    Asthma    Barrett esophagus    Chronic bronchitis (HCC)    Chronic diarrhea    Chronic lower back pain    Coronary atherosclerosis of native coronary artery    DES to LAD May 2014   Diverticulosis    Essential hypertension    Fatty liver    Fibromyalgia    GERD (gastroesophageal reflux disease)    H/O hiatal hernia    IBS (irritable bowel syndrome)    Major depression in partial remission ([place])    Migraines    Mixed hyperlipidemia    Myocardial infarction ([place])    2014   OSA on CPAP    Osteoporosis    Pancreatitis 2008   Pneumonia 1990's   Schatzki's ring    Sleep apnea    Type 2 diabetes mellitus ([place])     Past Surgical History:  Procedure Laterality  Date   45 HOUR [place] STUDY N/A 02/16/2018   Procedure: 24 HOUR PH STUDY;  Surgeon: Mauri Pole, MD;  Location: [facility];  Service: Endoscopy;  Laterality: N/A;   ABDOMINAL HYSTERECTOMY  ?1987   APPENDECTOMY  ?1987   BUNIONECTOMY Bilateral 1972   CARPAL TUNNEL RELEASE Bilateral ?1990   CATARACT EXTRACTION W/ INTRAOCULAR LENS  IMPLANT, BILATERAL Bilateral ?2005   CESAREAN SECTION  1985   CHOLECYSTECTOMY  1990'S   COLONOSCOPY  10/13   Dr. Britta Mccreedy - tubular adenomas and diverticulosis   DILATION AND CURETTAGE OF UTERUS  1970's   "probably 3" (12/09/2012)   ERCP  2008    ESOPHAGEAL DILATION  ` 2012   ESOPHAGEAL MANOMETRY N/A 10/21/2015   Procedure: ESOPHAGEAL MANOMETRY (EM);  Surgeon: Doran Stabler, MD;  Location: [facility];  Service: Gastroenterology;  Laterality: N/A;   ESOPHAGEAL MANOMETRY N/A 02/16/2018   Procedure: ESOPHAGEAL MANOMETRY (EM);  Surgeon: Mauri Pole, MD;  Location: [facility];  Service: Endoscopy;  Laterality: N/A;   HUMERUS FRACTURE SURGERY Right 07/19/1969   "horst threw me" (12/09/2012)   LEFT HEART CATH AND CORONARY ANGIOGRAPHY N/A 12/29/2017   Procedure: LEFT HEART CATH AND CORONARY ANGIOGRAPHY;  Surgeon: Wellington Hampshire, MD;  Location: [place] [facility];  Service: Cardiovascular;  Laterality: N/A;   NASAL SEPTUM SURGERY  1970's   PERCUTANEOUS CORONARY STENT INTERVENTION (PCI-S) N/A 12/09/2012   Procedure: PERCUTANEOUS CORONARY STENT INTERVENTION (PCI-S);  Surgeon: Burnell Blanks, MD;  Location: [facility] CATH LAB;  Service: Cardiovascular;  Laterality: N/A;   [place] IMPEDANCE STUDY N/A 02/16/2018   Procedure: [place] IMPEDANCE STUDY;  Surgeon: Mauri Pole, MD;  Location: [facility];  Service: Endoscopy;  Laterality: N/A;   TOE FUSION Right 10/11   First MTP joint   TOE SURGERY Right 1990's   "toe next to big toe:  dr said I had tumor; cut bone & stuff; another OR dr stretched tendons, etc" (12/09/2012)   TUBAL LIGATION  1985    Family History  Problem Relation Age of Onset   Stroke Mother    Cirrhosis Father    Hypertension Father    Esophageal cancer Sister    Colon polyps Sister        pre-cancerous, part of colon removed   Prostate cancer Brother    Colon cancer Neg Hx    Rectal cancer Neg Hx    Stomach cancer Neg Hx      Social History   Tobacco Use   Smoking status: Former    Packs/day: 0.80    Years: 15.00    Pack years: 12.00    Types: Cigarettes    Start date: 07/20/1976    Quit date: 07/20/1993    Years since quitting: 27.6   Smokeless tobacco: Never  Vaping Use   Vaping Use: Never used   Substance Use Topics   Alcohol use: No    Alcohol/week: 0.0 standard drinks   Drug use: No    Prior to Admission medications   Medication Sig Start Date End Date Taking? Authorizing Provider  albuterol (PROVENTIL HFA;VENTOLIN HFA) 108 (90 Base) MCG/ACT inhaler Inhale 2 puffs into the lungs every 4 (four) hours as needed. 12/15/17  Yes [provider]  ALPRAZolam [name]) 1 MG tablet Take 1 mg by mouth at bedtime. 10/22/15  Yes [provider]  aspirin EC 81 MG tablet Take 81 mg by mouth daily.   Yes [provider]  atorvastatin (LIPITOR) 20 MG tablet Take  20 mg by mouth daily.   Yes [provider]  CARTIA XT 120 MG 24 hr capsule TAKE ONE CAPSULE BY MOUTH EVERY MORNING Patient taking differently: Take 120 mg by mouth daily. 07/25/20  Yes [name], MD  cetirizine (ZYRTEC) 10 MG tablet Take 10 mg by mouth daily as needed for allergies.   Yes [provider]  dicyclomine (BENTYL) 10 MG capsule TAKE ONE CAPSULE BY MOUTH TWICE DAILY 20-30 MINUTES BEFORE BREAKFAST AND DINNER Patient taking differently: Take 10 mg by mouth 3 (three) times daily as needed for spasms. 10/16/19  Yes Levin Erp, PA  DULoxetine (CYMBALTA) 60 MG capsule Take 60 mg by mouth 2 (two) times daily.  08/15/15  Yes [provider]  FEROSUL 325 (65 Fe) MG tablet Take 325 mg by mouth 2 (two) times daily. 02/27/21  Yes [provider]  HYDROcodone-acetaminophen (NORCO/VICODIN) 5-325 MG tablet Take 1 tablet by mouth 2 (two) times daily. 10/22/15  Yes [provider]  isosorbide mononitrate (IMDUR) 30 MG 24 hr tablet TAKE 1/2 TABLET BY MOUTH ONCE DAILY 10/28/20  Yes [name], MD  losartan-hydrochlorothiazide (HYZAAR) 100-25 MG tablet Take 1 tablet by mouth daily. 04/11/19  Yes [provider]  metoprolol tartrate (LOPRESSOR) 25 MG tablet TAKE ONE TABLET BY MOUTH EVERY MORNING and TAKE ONE TABLET BY MOUTH EVERYDAY AT BEDTIME 07/25/20   Yes [name], MD  montelukast (SINGULAIR) 10 MG tablet Take 10 mg by mouth at bedtime.  08/15/15  Yes [provider]  nitroGLYCERIN (NITROSTAT) 0.4 MG SL tablet Place 0.4 mg under the tongue every 5 (five) minutes x 3 doses as needed (if no relief after 3rd dose, proceed to the ED for an evaluation or call 911).    Yes [provider]  NOVOLOG FLEXPEN 100 UNIT/ML FlexPen Inject 0-30 Units into the skin in the morning and at bedtime. 12/31/20  Yes [provider]  omeprazole (PRILOSEC) 40 MG capsule TAKE ONE CAPSULE BY MOUTH twice A DAY Patient taking differently: Take 40 mg by mouth in the morning and at bedtime. 11/08/20  Yes Danis, Kirke Corin, MD  promethazine (PHENERGAN) 25 MG tablet Take 25 mg by mouth at bedtime. Takes prn and at times does 1/2 tablet instead of whole one   Yes [provider]  traZODone (DESYREL) 50 MG tablet Take 100-150 mg by mouth at bedtime. 4 tabs hs 08/15/15  Yes [provider]  TRESIBA FLEXTOUCH 200 UNIT/ML SOPN Inject 100 Units into the skin 2 (two) times daily as needed (blood sugar level). As directed 10/28/15  Yes [provider]    Current Facility-Administered Medications  Medication Dose Route Frequency Provider Last Rate Last Admin   albuterol (VENTOLIN HFA) 108 (90 Base) MCG/ACT inhaler 2 puff  2 puff Inhalation Q4H PRN Wynetta Fines T, MD       ALPRAZolam [name]) tablet 1 mg  1 mg Oral QHS Lequita Halt, MD       aspirin EC tablet 81 mg  81 mg Oral Daily Wynetta Fines T, MD       atorvastatin (LIPITOR) tablet 20 mg  20 mg Oral Daily Wynetta Fines T, MD       cefTRIAXone (ROCEPHIN) 2 g in sodium chloride 0.9 % 100 mL IVPB  2 g Intravenous Q24H Wynetta Fines T, MD       dicyclomine (BENTYL) capsule 10 mg  10 mg Oral TID PRN Lequita Halt, MD  DULoxetine (CYMBALTA) DR capsule 60 mg  60 mg Oral BID Wynetta Fines T, MD       ferrous sulfate tablet 325 mg  325 mg Oral BID Wynetta Fines T, MD        hydrALAZINE (APRESOLINE) tablet 25 mg  25 mg Oral Q6H PRN Lequita Halt, MD       HYDROcodone-acetaminophen (NORCO/VICODIN) 5-325 MG per tablet 1 tablet  1 tablet Oral BID Wynetta Fines T, MD       insulin aspart (novoLOG) injection 0-20 Units  0-20 Units Subcutaneous TID WC Lequita Halt, MD       insulin glargine-yfgn [facility]) injection 15 Units  15 Units Subcutaneous Daily Lequita Halt, MD       [START ON 03/02/2021] isosorbide mononitrate (IMDUR) 24 hr tablet 15 mg  15 mg Oral Daily Wynetta Fines T, MD       loratadine (CLARITIN) tablet 10 mg  10 mg Oral Daily Wynetta Fines T, MD       metoprolol tartrate (LOPRESSOR) injection 2.5 mg  2.5 mg Intravenous Q6H PRN Wynetta Fines T, MD       metroNIDAZOLE (FLAGYL) IVPB 500 mg  500 mg Intravenous Q8H Zhang, Pearletha Forge T, MD       montelukast (SINGULAIR) tablet 10 mg  10 mg Oral QHS Wynetta Fines T, MD       octreotide (SANDOSTATIN) 500 mcg in sodium chloride 0.9 % 250 mL (2 mcg/mL) infusion  50 mcg/hr Intravenous Continuous Wynetta Fines T, MD       pantoprazole (PROTONIX) 80 mg /NS 100 mL IVPB  80 mg Intravenous Once Gareth Morgan, MD       Derrill Memo ON 03/05/2021] pantoprazole (PROTONIX) injection 40 mg  40 mg Intravenous Q12H Gareth Morgan, MD       pantoprozole (PROTONIX) 80 mg /NS 100 mL infusion  8 mg/hr Intravenous Continuous Gareth Morgan, MD       promethazine (PHENERGAN) tablet 25 mg  25 mg Oral QHS PRN Wynetta Fines T, MD       traZODone (DESYREL) tablet 200 mg  200 mg Oral QHS Lequita Halt, MD       Current Outpatient Medications  Medication Sig Dispense Refill   albuterol (PROVENTIL HFA;VENTOLIN HFA) 108 (90 Base) MCG/ACT inhaler Inhale 2 puffs into the lungs every 4 (four) hours as needed.  3   ALPRAZolam (XANAX) 1 MG tablet Take 1 mg by mouth at bedtime.  1   aspirin EC 81 MG tablet Take 81 mg by mouth daily.     atorvastatin (LIPITOR) 20 MG tablet Take 20 mg by mouth daily.     CARTIA XT 120 MG 24 hr capsule TAKE ONE CAPSULE BY MOUTH EVERY  MORNING (Patient taking differently: Take 120 mg by mouth daily.) 90 capsule 3   cetirizine (ZYRTEC) 10 MG tablet Take 10 mg by mouth daily as needed for allergies.     dicyclomine (BENTYL) 10 MG capsule TAKE ONE CAPSULE BY MOUTH TWICE DAILY 20-30 MINUTES BEFORE BREAKFAST AND DINNER (Patient taking differently: Take 10 mg by mouth 3 (three) times daily as needed for spasms.) 180 capsule 2   DULoxetine (CYMBALTA) 60 MG capsule Take 60 mg by mouth 2 (two) times daily.      FEROSUL 325 (65 Fe) MG tablet Take 325 mg by mouth 2 (two) times daily.     HYDROcodone-acetaminophen (NORCO/VICODIN) 5-325 MG tablet Take 1 tablet by mouth 2 (two) times daily.  0   isosorbide mononitrate (IMDUR)  30 MG 24 hr tablet TAKE 1/2 TABLET BY MOUTH ONCE DAILY 45 tablet 1   losartan-hydrochlorothiazide (HYZAAR) 100-25 MG tablet Take 1 tablet by mouth daily.     metoprolol tartrate (LOPRESSOR) 25 MG tablet TAKE ONE TABLET BY MOUTH EVERY MORNING and TAKE ONE TABLET BY MOUTH EVERYDAY AT BEDTIME 180 tablet 3   montelukast (SINGULAIR) 10 MG tablet Take 10 mg by mouth at bedtime.      nitroGLYCERIN (NITROSTAT) 0.4 MG SL tablet Place 0.4 mg under the tongue every 5 (five) minutes x 3 doses as needed (if no relief after 3rd dose, proceed to the ED for an evaluation or call 911).      NOVOLOG FLEXPEN 100 UNIT/ML FlexPen Inject 0-30 Units into the skin in the morning and at bedtime.     omeprazole (PRILOSEC) 40 MG capsule TAKE ONE CAPSULE BY MOUTH twice A DAY (Patient taking differently: Take 40 mg by mouth in the morning and at bedtime.) 180 capsule 3   promethazine (PHENERGAN) 25 MG tablet Take 25 mg by mouth at bedtime. Takes prn and at times does 1/2 tablet instead of whole one     traZODone (DESYREL) 50 MG tablet Take 100-150 mg by mouth at bedtime. 4 tabs hs     TRESIBA FLEXTOUCH 200 UNIT/ML SOPN Inject 100 Units into the skin 2 (two) times daily as needed (blood sugar level). As directed      Allergies as of 03/01/2021 -  Review Complete 03/01/2021  Allergen Reaction Noted   Erythromycin base Diarrhea 12/09/2012   Azithromycin Other (See Comments) 04/13/2013   Pollen extract Cough 01/01/2015   Remeron [mirtazapine] Other (See Comments) 11/05/2010   Zolpidem tartrate Other (See Comments)    Other Rash and Other (See Comments) 01/01/2015   Sulfonamide derivatives Rash    Tree extract Rash 01/01/2015     Review of Systems:    As per HPI, otherwise negative    Physical Exam:  Vital signs in last 24 hours: Temp:  [98.3 F (36.8 C)-99.7 F (37.6 C)] 99.7 F (37.6 C) (08/13 1641) Pulse Rate:  [50-70] 69 (08/13 1641) Resp:  [15-24] 19 (08/13 1641) BP: (104-149)/(39-52) 145/51 (08/13 1641) SpO2:  [94 %-99 %] 97 % (08/13 1641) Weight:  [70 kg] 70 kg (08/13 1228)   General:   Pleasant female in NAD Lungs:  Respirations even and unlabored. Lungs clear to auscultation bilaterally.   No wheezes, crackles, or rhonchi.  Heart:  Regular rate and rhythm; no MRG Abdomen: Mild generalized TTP without rebound or guarding.  Soft, nondistended.  No fluid wave. Normal bowel sounds.  Rectal:  Not performed.  Neurologic:  Alert and  oriented x4;  grossly normal neurologically. Psych:  Alert and cooperative. Normal affect.  LAB RESULTS: Recent Labs    03/01/21 1123  WBC 14.2*  HGB 7.4*  HCT 25.5*  PLT 319   BMET Recent Labs    03/01/21 1123  NA 132*  K 4.7  CL 100  CO2 26  GLUCOSE 311*  BUN 12  CREATININE 0.81  CALCIUM 9.7   LFT Recent Labs    03/01/21 1123  PROT 6.5  ALBUMIN 3.4*  AST 22  ALT 17  ALKPHOS 81  BILITOT 0.3   PT/INR No results for input(s): LABPROT, INR in the last 72 hours.  STUDIES: DG Chest 2 View  Result Date: 03/01/2021 CLINICAL DATA:  Chest pain, shortness of breath. EXAM: CHEST - 2 VIEW COMPARISON:  Chest x-rays dated 02/26/2021 and 05/02/2014. FINDINGS: Heart  size and mediastinal contours are stable. Coarse interstitial markings bilaterally, perihilar and lower  lobe predominant. No confluent opacity to suggest a superimposed pneumonia. No pleural effusion or pneumothorax is seen. No acute-appearing osseous abnormality. Aortic atherosclerosis. IMPRESSION: 1. No active cardiopulmonary disease. No evidence of pneumonia or pulmonary edema. 2. Coarse interstitial lung markings bilaterally, perihilar and lower lobe predominant, suggesting chronic bronchitic change and/or chronic interstitial lung disease. 3. Aortic atherosclerosis. Electronically Signed   By: Franki Cabot M.D.   On: 03/01/2021 12:34   CT ABDOMEN PELVIS W CONTRAST  Result Date: 03/01/2021 CLINICAL DATA:  Provided history of: Abdominal pain, acute (Ped 0-18y) EXAM: CT ABDOMEN AND PELVIS WITH CONTRAST TECHNIQUE: Multidetector CT imaging of the abdomen and pelvis was performed using the standard protocol following bolus administration of intravenous contrast. CONTRAST:  69m OMNIPAQUE IOHEXOL 350 MG/ML SOLN COMPARISON:  CT 04/19/2019 FINDINGS: Lower chest: Linear scarring or atelectasis with slight improvement from prior exam. Slight left pleural thickening without significant effusion. No acute airspace disease. Wall thickening of the distal esophagus is again seen. Paraesophageal varices. Hepatobiliary: Hepatic cirrhosis with heterogeneous decreased hepatic density a nodular contours. In previous geographic area of decreased density in the right lobe is less prominent than on prior exam. Hypertrophy of the right lobe with generalized atrophy of the left lobe. Cholecystectomy. Common bile duct measures 12 mm, previously 10 mm. No visualized choledocholithiasis Pancreas: No ductal dilatation or inflammation. Spleen: Normal in size without focal abnormality. Adrenals/Urinary Tract: Normal adrenal glands. Hydronephrosis or perinephric edema. Slight extrarenal pelvis configuration of both kidneys. There is homogeneous renal enhancement. Subcentimeter hypodensity in the lower left kidney is too small to  characterize but likely cyst. Unremarkable urinary bladder. No bladder wall thickening. No intravesicular air. Stomach/Bowel: Distal esophageal wall thickening with distal esophageal varices. Stomach is nondistended. There is a periampullary duodenal diverticulum without inflammation. Mild fecalization of small bowel contents without obstruction or inflammation. History of appendectomy. Distal descending and sigmoid colonic diverticulosis. There is colonic wall thickening of the sigmoid colon that appears similar to prior exam. Faint pericolonic edema in the left lower quadrant adjacent to the proximal sigmoid colon, series 3, image 71, may represent diverticulitis, though discretely inflamed diverticulum is not seen. Vascular/Lymphatic: Aortic atherosclerosis. No aortic aneurysm. Patent portal vein. Circumaortic left renal vein. No abdominopelvic adenopathy. Reproductive: Hysterectomy.  Quiescent appearance of the ovaries. Other: Trace perihepatic fluid/ascites, series 3, image 21. No free air or focal fluid collection. Induration of the lower anterior abdominal wall typical of medication injection sites, and seen on prior exam. Musculoskeletal: There are no acute or suspicious osseous abnormalities. IMPRESSION: 1. Faint pericolonic edema in the left lower quadrant adjacent to the proximal sigmoid colon, may represent diverticulitis, though discretely inflamed diverticulum is not seen. No perforation or abscess. 2. Hepatic cirrhosis. Paraesophageal varices. Trace perihepatic fluid/ascites. 3. Distal esophageal wall thickening with distal esophageal varices, unchanged from prior exam. 4. Cholecystectomy with mild common bile duct dilatation, likely related to prior cholecystectomy. No visualized choledocholithiasis. Aortic Atherosclerosis (ICD10-I70.0). Electronically Signed   By: MKeith RakeM.D.   On: 03/01/2021 16:56       Impression / Plan:   1) Symptomatic anemia 2) Melena 3) Generalized abdominal  pain - Transfusing 2 units PRBCs on admission - Iron studies drawn on arrival.  Will try to add ferritin and B12/folate - Holding aspirin - Plan for expedited EGD tomorrow morning for diagnostic and therapeutic intent - Clears okay for now with n.p.o. at midnight - Started on IV Protonix -  Start octreotide - Start Rocephin  4) Cirrhosis on CT 5) Esophageal varices on CT 6) Trace ascites - CT with cirrhotic appearing liver and esophageal varices.  These are new finding compared with CT in 03/2019 - Liver enzymes otherwise normal - Check INR - Other than mildly reduced albumin no other serologic markers of impaired synthetic function - Trace ascites not likely amenable to paracentesis, but can obtain abdominal ultrasound - Starting octreotide and Rocephin as above  7) Night sweats - CT abdomen/pelvis otherwise without malignant appearing etiology - EGD scheduled for tomorrow - Low threshold for repeat colonoscopy in the very near future, possibly later on this admission  The indications, risks, and benefits of EGD were explained to the patient in detail. Risks include but are not limited to bleeding, perforation, adverse reaction to medications, and cardiopulmonary compromise. Sequelae include but are not limited to the possibility of surgery, hospitalization, and mortality. The patient verbalized understanding and wished to proceed.    LOS: 0 days   [name]  03/01/2021, 6:13 PM

## 2021-03-01 NOTE — H&P (View-Only) (Signed)
Consultation  Referring Provider:     Lequita Halt, MD Primary Care Physician:  Caryl Bis, MD Primary Gastroenterologist:     Dr. [name]    Reason for Consultation:     Melena, symptomatic anemia         HPI:   [name] is a 70 y.o. female with a medical history including HTN, OSA (on CPAP), diabetes, IBS, esophageal spasm, migraines, HLD, CAD with DES in 2014, GERD, Schatzki's ring, post ERCP pancreatitis in 2008, osteoporosis, presenting to the ER with DOE along with melena.  She reports having melena, "foul-smelling stools", and increased stool frequency for the last month or so.  Takes ASA 81 mg/day, otherwise no NSAIDs.  Has been having night sweats for the last year, and reports a 10 pound weight loss over the last month or so.  No prior known history of GI bleed.  She was seen by her PCM with reportedly FOBT positive stool in the last week or so.    Evaluated at [facility] on 02/26/2021: - H/H was 7.6/25.2 with MCV/RDW 79/24 - WC 11.9, PLT 295 - BUN/creatinine 9/0.8 - INR 0.9  Most recent comparison labs available for review were 04/19/2019: - H/H 14.8/43.7 with MCV/RDW 98/12.5  She has had several upper endoscopies over the years, with most recent in 05/2019 and notable for the Schatzki's ring, small HH, and ?gastritis but no PUD.  - Esophageal Manometry (01/2018): Elevated DCI consistent with distal esophageal spasm - pH/impedance (01/2018, off PPI): No abnormal esophageal acid exposure - Esophagram (05/2019): Smooth narrowing/stricture at level of distal esophagus measuring 9 mm.  Barium tablet stuck and did not pass.  Mild associated esophageal dysmotility.  Has had at least 3 colonoscopies since 2010, with most recent in 11/2014: Severe sigmoid diverticulosis, internal hemorrhoids.  Otherwise normal.  Repeat in 10 years  ER evaluation today notable for the following: - H/H 7.4/25.5 with MCV/RDW 82/16.5 - WBC 14.2, PLT 319 - BUN/creatinine 12/0.8 - Albumin  3.4, otherwise normal liver enzymes - Troponin normal x2 - FOBT negative - CT abdomen/pelvis: Hepatic cirrhosis with decreased hepatic density, nodular contours.  Hypertrophy right lobe.  Trace ascites.  CBD 12 mm and prior cholecystectomy.  Normal pancreas and PD.  Distal esophageal wall thickening with distal esophageal varices.  Periampullary duodenal diverticulum.  Distal descending and sigmoid colon diverticulosis with faint pericolonic edema and LLQ adjacent to proximal sigmoid colon, may represent diverticulitis though inflamed diverticulum not seen.  No perforation or abscess.   Past Medical History:  Diagnosis Date   Anxiety    Arthritis    Asthma    Barrett esophagus    Chronic bronchitis (HCC)    Chronic diarrhea    Chronic lower back pain    Coronary atherosclerosis of native coronary artery    DES to LAD May 2014   Diverticulosis    Essential hypertension    Fatty liver    Fibromyalgia    GERD (gastroesophageal reflux disease)    H/O hiatal hernia    IBS (irritable bowel syndrome)    Major depression in partial remission ([place])    Migraines    Mixed hyperlipidemia    Myocardial infarction ([place])    2014   OSA on CPAP    Osteoporosis    Pancreatitis 2008   Pneumonia 1990's   Schatzki's ring    Sleep apnea    Type 2 diabetes mellitus ([place])     Past Surgical History:  Procedure Laterality  Date   14 HOUR [place] STUDY N/A 02/16/2018   Procedure: 24 HOUR PH STUDY;  Surgeon: Mauri Pole, MD;  Location: [facility];  Service: Endoscopy;  Laterality: N/A;   ABDOMINAL HYSTERECTOMY  ?1987   APPENDECTOMY  ?1987   BUNIONECTOMY Bilateral 1972   CARPAL TUNNEL RELEASE Bilateral ?1990   CATARACT EXTRACTION W/ INTRAOCULAR LENS  IMPLANT, BILATERAL Bilateral ?2005   CESAREAN SECTION  1985   CHOLECYSTECTOMY  1990'S   COLONOSCOPY  10/13   Dr. Britta Mccreedy - tubular adenomas and diverticulosis   DILATION AND CURETTAGE OF UTERUS  1970's   "probably 3" (12/09/2012)   ERCP  2008    ESOPHAGEAL DILATION  ` 2012   ESOPHAGEAL MANOMETRY N/A 10/21/2015   Procedure: ESOPHAGEAL MANOMETRY (EM);  Surgeon: Doran Stabler, MD;  Location: [facility];  Service: Gastroenterology;  Laterality: N/A;   ESOPHAGEAL MANOMETRY N/A 02/16/2018   Procedure: ESOPHAGEAL MANOMETRY (EM);  Surgeon: Mauri Pole, MD;  Location: [facility];  Service: Endoscopy;  Laterality: N/A;   HUMERUS FRACTURE SURGERY Right 07/19/1969   "horst threw me" (12/09/2012)   LEFT HEART CATH AND CORONARY ANGIOGRAPHY N/A 12/29/2017   Procedure: LEFT HEART CATH AND CORONARY ANGIOGRAPHY;  Surgeon: Wellington Hampshire, MD;  Location: [place] [facility];  Service: Cardiovascular;  Laterality: N/A;   NASAL SEPTUM SURGERY  1970's   PERCUTANEOUS CORONARY STENT INTERVENTION (PCI-S) N/A 12/09/2012   Procedure: PERCUTANEOUS CORONARY STENT INTERVENTION (PCI-S);  Surgeon: Burnell Blanks, MD;  Location: [facility] CATH LAB;  Service: Cardiovascular;  Laterality: N/A;   [place] IMPEDANCE STUDY N/A 02/16/2018   Procedure: [place] IMPEDANCE STUDY;  Surgeon: Mauri Pole, MD;  Location: [facility];  Service: Endoscopy;  Laterality: N/A;   TOE FUSION Right 10/11   First MTP joint   TOE SURGERY Right 1990's   "toe next to big toe:  dr said I had tumor; cut bone & stuff; another OR dr stretched tendons, etc" (12/09/2012)   TUBAL LIGATION  1985    Family History  Problem Relation Age of Onset   Stroke Mother    Cirrhosis Father    Hypertension Father    Esophageal cancer Sister    Colon polyps Sister        pre-cancerous, part of colon removed   Prostate cancer Brother    Colon cancer Neg Hx    Rectal cancer Neg Hx    Stomach cancer Neg Hx      Social History   Tobacco Use   Smoking status: Former    Packs/day: 0.80    Years: 15.00    Pack years: 12.00    Types: Cigarettes    Start date: 07/20/1976    Quit date: 07/20/1993    Years since quitting: 27.6   Smokeless tobacco: Never  Vaping Use   Vaping Use: Never used   Substance Use Topics   Alcohol use: No    Alcohol/week: 0.0 standard drinks   Drug use: No    Prior to Admission medications   Medication Sig Start Date End Date Taking? Authorizing Provider  albuterol (PROVENTIL HFA;VENTOLIN HFA) 108 (90 Base) MCG/ACT inhaler Inhale 2 puffs into the lungs every 4 (four) hours as needed. 12/15/17  Yes [provider]  ALPRAZolam [name]) 1 MG tablet Take 1 mg by mouth at bedtime. 10/22/15  Yes [provider]  aspirin EC 81 MG tablet Take 81 mg by mouth daily.   Yes [provider]  atorvastatin (LIPITOR) 20 MG tablet Take  20 mg by mouth daily.   Yes [provider]  CARTIA XT 120 MG 24 hr capsule TAKE ONE CAPSULE BY MOUTH EVERY MORNING Patient taking differently: Take 120 mg by mouth daily. 07/25/20  Yes [name], MD  cetirizine (ZYRTEC) 10 MG tablet Take 10 mg by mouth daily as needed for allergies.   Yes [provider]  dicyclomine (BENTYL) 10 MG capsule TAKE ONE CAPSULE BY MOUTH TWICE DAILY 20-30 MINUTES BEFORE BREAKFAST AND DINNER Patient taking differently: Take 10 mg by mouth 3 (three) times daily as needed for spasms. 10/16/19  Yes Levin Erp, PA  DULoxetine (CYMBALTA) 60 MG capsule Take 60 mg by mouth 2 (two) times daily.  08/15/15  Yes [provider]  FEROSUL 325 (65 Fe) MG tablet Take 325 mg by mouth 2 (two) times daily. 02/27/21  Yes [provider]  HYDROcodone-acetaminophen (NORCO/VICODIN) 5-325 MG tablet Take 1 tablet by mouth 2 (two) times daily. 10/22/15  Yes [provider]  isosorbide mononitrate (IMDUR) 30 MG 24 hr tablet TAKE 1/2 TABLET BY MOUTH ONCE DAILY 10/28/20  Yes [name], MD  losartan-hydrochlorothiazide (HYZAAR) 100-25 MG tablet Take 1 tablet by mouth daily. 04/11/19  Yes [provider]  metoprolol tartrate (LOPRESSOR) 25 MG tablet TAKE ONE TABLET BY MOUTH EVERY MORNING and TAKE ONE TABLET BY MOUTH EVERYDAY AT BEDTIME 07/25/20   Yes [name], MD  montelukast (SINGULAIR) 10 MG tablet Take 10 mg by mouth at bedtime.  08/15/15  Yes [provider]  nitroGLYCERIN (NITROSTAT) 0.4 MG SL tablet Place 0.4 mg under the tongue every 5 (five) minutes x 3 doses as needed (if no relief after 3rd dose, proceed to the ED for an evaluation or call 911).    Yes [provider]  NOVOLOG FLEXPEN 100 UNIT/ML FlexPen Inject 0-30 Units into the skin in the morning and at bedtime. 12/31/20  Yes [provider]  omeprazole (PRILOSEC) 40 MG capsule TAKE ONE CAPSULE BY MOUTH twice A DAY Patient taking differently: Take 40 mg by mouth in the morning and at bedtime. 11/08/20  Yes Danis, Kirke Corin, MD  promethazine (PHENERGAN) 25 MG tablet Take 25 mg by mouth at bedtime. Takes prn and at times does 1/2 tablet instead of whole one   Yes [provider]  traZODone (DESYREL) 50 MG tablet Take 100-150 mg by mouth at bedtime. 4 tabs hs 08/15/15  Yes [provider]  TRESIBA FLEXTOUCH 200 UNIT/ML SOPN Inject 100 Units into the skin 2 (two) times daily as needed (blood sugar level). As directed 10/28/15  Yes [provider]    Current Facility-Administered Medications  Medication Dose Route Frequency Provider Last Rate Last Admin   albuterol (VENTOLIN HFA) 108 (90 Base) MCG/ACT inhaler 2 puff  2 puff Inhalation Q4H PRN Wynetta Fines T, MD       ALPRAZolam [name]) tablet 1 mg  1 mg Oral QHS Lequita Halt, MD       aspirin EC tablet 81 mg  81 mg Oral Daily Wynetta Fines T, MD       atorvastatin (LIPITOR) tablet 20 mg  20 mg Oral Daily Wynetta Fines T, MD       cefTRIAXone (ROCEPHIN) 2 g in sodium chloride 0.9 % 100 mL IVPB  2 g Intravenous Q24H Wynetta Fines T, MD       dicyclomine (BENTYL) capsule 10 mg  10 mg Oral TID PRN Lequita Halt, MD  DULoxetine (CYMBALTA) DR capsule 60 mg  60 mg Oral BID Wynetta Fines T, MD       ferrous sulfate tablet 325 mg  325 mg Oral BID Wynetta Fines T, MD        hydrALAZINE (APRESOLINE) tablet 25 mg  25 mg Oral Q6H PRN Lequita Halt, MD       HYDROcodone-acetaminophen (NORCO/VICODIN) 5-325 MG per tablet 1 tablet  1 tablet Oral BID Wynetta Fines T, MD       insulin aspart (novoLOG) injection 0-20 Units  0-20 Units Subcutaneous TID WC Lequita Halt, MD       insulin glargine-yfgn [facility]) injection 15 Units  15 Units Subcutaneous Daily Lequita Halt, MD       [START ON 03/02/2021] isosorbide mononitrate (IMDUR) 24 hr tablet 15 mg  15 mg Oral Daily Wynetta Fines T, MD       loratadine (CLARITIN) tablet 10 mg  10 mg Oral Daily Wynetta Fines T, MD       metoprolol tartrate (LOPRESSOR) injection 2.5 mg  2.5 mg Intravenous Q6H PRN Wynetta Fines T, MD       metroNIDAZOLE (FLAGYL) IVPB 500 mg  500 mg Intravenous Q8H Zhang, Pearletha Forge T, MD       montelukast (SINGULAIR) tablet 10 mg  10 mg Oral QHS Wynetta Fines T, MD       octreotide (SANDOSTATIN) 500 mcg in sodium chloride 0.9 % 250 mL (2 mcg/mL) infusion  50 mcg/hr Intravenous Continuous Wynetta Fines T, MD       pantoprazole (PROTONIX) 80 mg /NS 100 mL IVPB  80 mg Intravenous Once Gareth Morgan, MD       Derrill Memo ON 03/05/2021] pantoprazole (PROTONIX) injection 40 mg  40 mg Intravenous Q12H Gareth Morgan, MD       pantoprozole (PROTONIX) 80 mg /NS 100 mL infusion  8 mg/hr Intravenous Continuous Gareth Morgan, MD       promethazine (PHENERGAN) tablet 25 mg  25 mg Oral QHS PRN Wynetta Fines T, MD       traZODone (DESYREL) tablet 200 mg  200 mg Oral QHS Lequita Halt, MD       Current Outpatient Medications  Medication Sig Dispense Refill   albuterol (PROVENTIL HFA;VENTOLIN HFA) 108 (90 Base) MCG/ACT inhaler Inhale 2 puffs into the lungs every 4 (four) hours as needed.  3   ALPRAZolam (XANAX) 1 MG tablet Take 1 mg by mouth at bedtime.  1   aspirin EC 81 MG tablet Take 81 mg by mouth daily.     atorvastatin (LIPITOR) 20 MG tablet Take 20 mg by mouth daily.     CARTIA XT 120 MG 24 hr capsule TAKE ONE CAPSULE BY MOUTH EVERY  MORNING (Patient taking differently: Take 120 mg by mouth daily.) 90 capsule 3   cetirizine (ZYRTEC) 10 MG tablet Take 10 mg by mouth daily as needed for allergies.     dicyclomine (BENTYL) 10 MG capsule TAKE ONE CAPSULE BY MOUTH TWICE DAILY 20-30 MINUTES BEFORE BREAKFAST AND DINNER (Patient taking differently: Take 10 mg by mouth 3 (three) times daily as needed for spasms.) 180 capsule 2   DULoxetine (CYMBALTA) 60 MG capsule Take 60 mg by mouth 2 (two) times daily.      FEROSUL 325 (65 Fe) MG tablet Take 325 mg by mouth 2 (two) times daily.     HYDROcodone-acetaminophen (NORCO/VICODIN) 5-325 MG tablet Take 1 tablet by mouth 2 (two) times daily.  0   isosorbide mononitrate (IMDUR)  30 MG 24 hr tablet TAKE 1/2 TABLET BY MOUTH ONCE DAILY 45 tablet 1   losartan-hydrochlorothiazide (HYZAAR) 100-25 MG tablet Take 1 tablet by mouth daily.     metoprolol tartrate (LOPRESSOR) 25 MG tablet TAKE ONE TABLET BY MOUTH EVERY MORNING and TAKE ONE TABLET BY MOUTH EVERYDAY AT BEDTIME 180 tablet 3   montelukast (SINGULAIR) 10 MG tablet Take 10 mg by mouth at bedtime.      nitroGLYCERIN (NITROSTAT) 0.4 MG SL tablet Place 0.4 mg under the tongue every 5 (five) minutes x 3 doses as needed (if no relief after 3rd dose, proceed to the ED for an evaluation or call 911).      NOVOLOG FLEXPEN 100 UNIT/ML FlexPen Inject 0-30 Units into the skin in the morning and at bedtime.     omeprazole (PRILOSEC) 40 MG capsule TAKE ONE CAPSULE BY MOUTH twice A DAY (Patient taking differently: Take 40 mg by mouth in the morning and at bedtime.) 180 capsule 3   promethazine (PHENERGAN) 25 MG tablet Take 25 mg by mouth at bedtime. Takes prn and at times does 1/2 tablet instead of whole one     traZODone (DESYREL) 50 MG tablet Take 100-150 mg by mouth at bedtime. 4 tabs hs     TRESIBA FLEXTOUCH 200 UNIT/ML SOPN Inject 100 Units into the skin 2 (two) times daily as needed (blood sugar level). As directed      Allergies as of 03/01/2021 -  Review Complete 03/01/2021  Allergen Reaction Noted   Erythromycin base Diarrhea 12/09/2012   Azithromycin Other (See Comments) 04/13/2013   Pollen extract Cough 01/01/2015   Remeron [mirtazapine] Other (See Comments) 11/05/2010   Zolpidem tartrate Other (See Comments)    Other Rash and Other (See Comments) 01/01/2015   Sulfonamide derivatives Rash    Tree extract Rash 01/01/2015     Review of Systems:    As per HPI, otherwise negative    Physical Exam:  Vital signs in last 24 hours: Temp:  [98.3 F (36.8 C)-99.7 F (37.6 C)] 99.7 F (37.6 C) (08/13 1641) Pulse Rate:  [50-70] 69 (08/13 1641) Resp:  [15-24] 19 (08/13 1641) BP: (104-149)/(39-52) 145/51 (08/13 1641) SpO2:  [94 %-99 %] 97 % (08/13 1641) Weight:  [70 kg] 70 kg (08/13 1228)   General:   Pleasant female in NAD Lungs:  Respirations even and unlabored. Lungs clear to auscultation bilaterally.   No wheezes, crackles, or rhonchi.  Heart:  Regular rate and rhythm; no MRG Abdomen: Mild generalized TTP without rebound or guarding.  Soft, nondistended.  No fluid wave. Normal bowel sounds.  Rectal:  Not performed.  Neurologic:  Alert and  oriented x4;  grossly normal neurologically. Psych:  Alert and cooperative. Normal affect.  LAB RESULTS: Recent Labs    03/01/21 1123  WBC 14.2*  HGB 7.4*  HCT 25.5*  PLT 319   BMET Recent Labs    03/01/21 1123  NA 132*  K 4.7  CL 100  CO2 26  GLUCOSE 311*  BUN 12  CREATININE 0.81  CALCIUM 9.7   LFT Recent Labs    03/01/21 1123  PROT 6.5  ALBUMIN 3.4*  AST 22  ALT 17  ALKPHOS 81  BILITOT 0.3   PT/INR No results for input(s): LABPROT, INR in the last 72 hours.  STUDIES: DG Chest 2 View  Result Date: 03/01/2021 CLINICAL DATA:  Chest pain, shortness of breath. EXAM: CHEST - 2 VIEW COMPARISON:  Chest x-rays dated 02/26/2021 and 05/02/2014. FINDINGS: Heart  size and mediastinal contours are stable. Coarse interstitial markings bilaterally, perihilar and lower  lobe predominant. No confluent opacity to suggest a superimposed pneumonia. No pleural effusion or pneumothorax is seen. No acute-appearing osseous abnormality. Aortic atherosclerosis. IMPRESSION: 1. No active cardiopulmonary disease. No evidence of pneumonia or pulmonary edema. 2. Coarse interstitial lung markings bilaterally, perihilar and lower lobe predominant, suggesting chronic bronchitic change and/or chronic interstitial lung disease. 3. Aortic atherosclerosis. Electronically Signed   By: Franki Cabot M.D.   On: 03/01/2021 12:34   CT ABDOMEN PELVIS W CONTRAST  Result Date: 03/01/2021 CLINICAL DATA:  Provided history of: Abdominal pain, acute (Ped 0-18y) EXAM: CT ABDOMEN AND PELVIS WITH CONTRAST TECHNIQUE: Multidetector CT imaging of the abdomen and pelvis was performed using the standard protocol following bolus administration of intravenous contrast. CONTRAST:  40m OMNIPAQUE IOHEXOL 350 MG/ML SOLN COMPARISON:  CT 04/19/2019 FINDINGS: Lower chest: Linear scarring or atelectasis with slight improvement from prior exam. Slight left pleural thickening without significant effusion. No acute airspace disease. Wall thickening of the distal esophagus is again seen. Paraesophageal varices. Hepatobiliary: Hepatic cirrhosis with heterogeneous decreased hepatic density a nodular contours. In previous geographic area of decreased density in the right lobe is less prominent than on prior exam. Hypertrophy of the right lobe with generalized atrophy of the left lobe. Cholecystectomy. Common bile duct measures 12 mm, previously 10 mm. No visualized choledocholithiasis Pancreas: No ductal dilatation or inflammation. Spleen: Normal in size without focal abnormality. Adrenals/Urinary Tract: Normal adrenal glands. Hydronephrosis or perinephric edema. Slight extrarenal pelvis configuration of both kidneys. There is homogeneous renal enhancement. Subcentimeter hypodensity in the lower left kidney is too small to  characterize but likely cyst. Unremarkable urinary bladder. No bladder wall thickening. No intravesicular air. Stomach/Bowel: Distal esophageal wall thickening with distal esophageal varices. Stomach is nondistended. There is a periampullary duodenal diverticulum without inflammation. Mild fecalization of small bowel contents without obstruction or inflammation. History of appendectomy. Distal descending and sigmoid colonic diverticulosis. There is colonic wall thickening of the sigmoid colon that appears similar to prior exam. Faint pericolonic edema in the left lower quadrant adjacent to the proximal sigmoid colon, series 3, image 71, may represent diverticulitis, though discretely inflamed diverticulum is not seen. Vascular/Lymphatic: Aortic atherosclerosis. No aortic aneurysm. Patent portal vein. Circumaortic left renal vein. No abdominopelvic adenopathy. Reproductive: Hysterectomy.  Quiescent appearance of the ovaries. Other: Trace perihepatic fluid/ascites, series 3, image 21. No free air or focal fluid collection. Induration of the lower anterior abdominal wall typical of medication injection sites, and seen on prior exam. Musculoskeletal: There are no acute or suspicious osseous abnormalities. IMPRESSION: 1. Faint pericolonic edema in the left lower quadrant adjacent to the proximal sigmoid colon, may represent diverticulitis, though discretely inflamed diverticulum is not seen. No perforation or abscess. 2. Hepatic cirrhosis. Paraesophageal varices. Trace perihepatic fluid/ascites. 3. Distal esophageal wall thickening with distal esophageal varices, unchanged from prior exam. 4. Cholecystectomy with mild common bile duct dilatation, likely related to prior cholecystectomy. No visualized choledocholithiasis. Aortic Atherosclerosis (ICD10-I70.0). Electronically Signed   By: MKeith RakeM.D.   On: 03/01/2021 16:56       Impression / Plan:   1) Symptomatic anemia 2) Melena 3) Generalized abdominal  pain - Transfusing 2 units PRBCs on admission - Iron studies drawn on arrival.  Will try to add ferritin and B12/folate - Holding aspirin - Plan for expedited EGD tomorrow morning for diagnostic and therapeutic intent - Clears okay for now with n.p.o. at midnight - Started on IV Protonix -  Start octreotide - Start Rocephin  4) Cirrhosis on CT 5) Esophageal varices on CT 6) Trace ascites - CT with cirrhotic appearing liver and esophageal varices.  These are new finding compared with CT in 03/2019 - Liver enzymes otherwise normal - Check INR - Other than mildly reduced albumin no other serologic markers of impaired synthetic function - Trace ascites not likely amenable to paracentesis, but can obtain abdominal ultrasound - Starting octreotide and Rocephin as above  7) Night sweats - CT abdomen/pelvis otherwise without malignant appearing etiology - EGD scheduled for tomorrow - Low threshold for repeat colonoscopy in the very near future, possibly later on this admission  The indications, risks, and benefits of EGD were explained to the patient in detail. Risks include but are not limited to bleeding, perforation, adverse reaction to medications, and cardiopulmonary compromise. Sequelae include but are not limited to the possibility of surgery, hospitalization, and mortality. The patient verbalized understanding and wished to proceed.    LOS: 0 days   Lavena Bullion  03/01/2021, 6:13 PM

## 2021-03-01 NOTE — ED Provider Notes (Signed)
[facility] EMERGENCY DEPARTMENT Provider Note   CSN: [id number] Arrival date & time: 03/01/21  1025     History No chief complaint on file.   [name] is a 70 y.o. female.  HPI     70 year old female with a history of coronary artery disease, hypertension, depression, anxiety, asthma, hyperlipidemia, fatty liver, type 2 diabetes, sleep apnea, who presents with concern for blood in stool, generalized abdominal pain, chest pain and shortness of breath for 1 month.  She was seen at [facility] emergency department on Wednesday, and her hemoglobin was 7.6 then.  She followed up with her PCP who who told her to come to the emergency department as her hemoglobin is lower than it was on Wednesday.  Reports black stool for one month. Istat hemoccult negative and [facility] on Wednesday per pt but was positive with her PCP stool sample.  Reports some abdominal pain over the last month, waxing and waning, diffuse but more on right side.  No nausea, vomiting. No fevers.   Has had fatigue, lightheadedness, dyspnea on exertion No fevers. Not on anticoagulation. Chest pain on exertion.  No current chest pain.  Reports black stool sometimes many per day, sometimes once per day, reports stool sample for PCP was black and heme positive.   Has seen Dr. [name] of GI, hx diverticulosis.   Past Medical History:  Diagnosis Date   Anxiety    Arthritis    Asthma    Barrett esophagus    Chronic bronchitis (HCC)    Chronic diarrhea    Chronic lower back pain    Coronary atherosclerosis of native coronary artery    DES to LAD May 2014   Diverticulosis    Essential hypertension    Fatty liver    Fibromyalgia    GERD (gastroesophageal reflux disease)    H/O hiatal hernia    IBS (irritable bowel syndrome)    Major depression in partial remission ([place])    Migraines    Mixed hyperlipidemia    Myocardial infarction ([place])    2014   OSA on CPAP    Osteoporosis     Pancreatitis 2008   Pneumonia 1990's   Schatzki's ring    Sleep apnea    Type 2 diabetes mellitus ([place])     Patient Active Problem List   Diagnosis Date Noted   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   GI bleed 03/01/2021   Ascites    Melena    Microcytic anemia    Heartburn    Non-cardiac chest pain    Unstable angina ([place]) 12/28/2017   Dysphagia    IBS (irritable bowel syndrome) 10/18/2015   Diarrhea 10/11/2014   Esophageal stricture 10/11/2014   History of colonic polyps 10/11/2014   Rectal bleeding 10/11/2014   Type 2 diabetes mellitus ([place]) 12/27/2012   Coronary atherosclerosis of native coronary artery 12/10/2012   Mixed hyperlipidemia 05/24/2007   Obstructive sleep apnea 05/24/2007   Essential hypertension, benign 05/24/2007    Past Surgical History:  Procedure Laterality Date   24 HOUR [place] STUDY N/A 02/16/2018   Procedure: 24 HOUR PH STUDY;  Surgeon: Mauri Pole, MD;  Location: [facility];  Service: Endoscopy;  Laterality: N/A;   ABDOMINAL HYSTERECTOMY  ?1987   APPENDECTOMY  ?1987   BUNIONECTOMY Bilateral 1972   CARPAL TUNNEL RELEASE Bilateral ?1990   CATARACT EXTRACTION W/ INTRAOCULAR LENS  IMPLANT, BILATERAL Bilateral ?2005   [place]  1990'S   COLONOSCOPY  10/13   Dr. Britta Mccreedy - tubular adenomas and diverticulosis   DILATION AND CURETTAGE OF UTERUS  1970's   "probably 3" (12/09/2012)   ERCP  2008   ESOPHAGEAL DILATION  ` 2012   ESOPHAGEAL MANOMETRY N/A 10/21/2015   Procedure: ESOPHAGEAL MANOMETRY (EM);  Surgeon: Doran Stabler, MD;  Location: [facility];  Service: Gastroenterology;  Laterality: N/A;   ESOPHAGEAL MANOMETRY N/A 02/16/2018   Procedure: ESOPHAGEAL MANOMETRY (EM);  Surgeon: Mauri Pole, MD;  Location: [facility];  Service: Endoscopy;  Laterality: N/A;   HUMERUS FRACTURE SURGERY Right 07/19/1969   "horst threw me" (12/09/2012)   LEFT HEART CATH AND CORONARY ANGIOGRAPHY N/A 12/29/2017    Procedure: LEFT HEART CATH AND CORONARY ANGIOGRAPHY;  Surgeon: Wellington Hampshire, MD;  Location: [facility] CV LAB;  Service: Cardiovascular;  Laterality: N/A;   NASAL SEPTUM SURGERY  1970's   PERCUTANEOUS CORONARY STENT INTERVENTION (PCI-S) N/A 12/09/2012   Procedure: PERCUTANEOUS CORONARY STENT INTERVENTION (PCI-S);  Surgeon: Burnell Blanks, MD;  Location: [facility] CATH LAB;  Service: Cardiovascular;  Laterality: N/A;   [place] IMPEDANCE STUDY N/A 02/16/2018   Procedure: [place] IMPEDANCE STUDY;  Surgeon: Mauri Pole, MD;  Location: [facility];  Service: Endoscopy;  Laterality: N/A;   TOE FUSION Right 10/11   First MTP joint   TOE SURGERY Right 1990's   "toe next to big toe:  dr said I had tumor; cut bone & stuff; another OR dr stretched tendons, etc" (12/09/2012)   TUBAL LIGATION  1985     OB History   No obstetric history on file.     Family History  Problem Relation Age of Onset   Stroke Mother    Cirrhosis Father    Hypertension Father    Esophageal cancer Sister    Colon polyps Sister        pre-cancerous, part of colon removed   Prostate cancer Brother    Colon cancer Neg Hx    Rectal cancer Neg Hx    Stomach cancer Neg Hx     Social History   Tobacco Use   Smoking status: Former    Packs/day: 0.80    Years: 15.00    Pack years: 12.00    Types: Cigarettes    Start date: 07/20/1976    Quit date: 07/20/1993    Years since quitting: 27.6   Smokeless tobacco: Never  Vaping Use   Vaping Use: Never used  Substance Use Topics   Alcohol use: No    Alcohol/week: 0.0 standard drinks   Drug use: No    Home Medications Prior to Admission medications   Medication Sig Start Date End Date Taking? Authorizing Provider  albuterol (PROVENTIL HFA;VENTOLIN HFA) 108 (90 Base) MCG/ACT inhaler Inhale 2 puffs into the lungs every 4 (four) hours as needed. 12/15/17  Yes [provider]  ALPRAZolam [name]) 1 MG tablet Take 1 mg by mouth at bedtime. 10/22/15  Yes [provider]  aspirin EC 81 MG tablet Take 81 mg by mouth daily.   Yes [provider]  atorvastatin (LIPITOR) 20 MG tablet Take 20 mg by mouth daily.   Yes [provider]  CARTIA XT 120 MG 24 hr capsule TAKE ONE CAPSULE BY MOUTH EVERY MORNING Patient taking differently: Take 120 mg by mouth daily. 07/25/20  Yes [name], MD  cetirizine (ZYRTEC) 10 MG tablet Take 10 mg by mouth daily as needed for allergies.   Yes [provider]  dicyclomine (BENTYL) 10 MG capsule TAKE ONE CAPSULE BY MOUTH TWICE DAILY 20-30 MINUTES BEFORE BREAKFAST AND DINNER Patient taking differently: Take 10 mg by mouth 3 (three) times daily as needed for spasms. 10/16/19  Yes Levin Erp, PA  DULoxetine (CYMBALTA) 60 MG capsule Take 60 mg by mouth 2 (two) times daily.  08/15/15  Yes [provider]  FEROSUL 325 (65 Fe) MG tablet Take 325 mg by mouth 2 (two) times daily. 02/27/21  Yes [provider]  HYDROcodone-acetaminophen (NORCO/VICODIN) 5-325 MG tablet Take 1 tablet by mouth 2 (two) times daily. 10/22/15  Yes [provider]  isosorbide mononitrate (IMDUR) 30 MG 24 hr tablet TAKE 1/2 TABLET BY MOUTH ONCE DAILY 10/28/20  Yes [name], MD  losartan-hydrochlorothiazide (HYZAAR) 100-25 MG tablet Take 1 tablet by mouth daily. 04/11/19  Yes [provider]  metoprolol tartrate (LOPRESSOR) 25 MG tablet TAKE ONE TABLET BY MOUTH EVERY MORNING and TAKE ONE TABLET BY MOUTH EVERYDAY AT BEDTIME 07/25/20  Yes [name], MD  montelukast (SINGULAIR) 10 MG tablet Take 10 mg by mouth at bedtime.  08/15/15  Yes [provider]  nitroGLYCERIN (NITROSTAT) 0.4 MG SL tablet Place 0.4 mg under the tongue every 5 (five) minutes x 3 doses as needed (if no relief after 3rd dose, proceed to the ED for an evaluation or call 911).    Yes [provider]  NOVOLOG FLEXPEN 100 UNIT/ML FlexPen Inject 0-30 Units into the skin in the morning and at  bedtime. 12/31/20  Yes [provider]  omeprazole (PRILOSEC) 40 MG capsule TAKE ONE CAPSULE BY MOUTH twice A DAY Patient taking differently: Take 40 mg by mouth in the morning and at bedtime. 11/08/20  Yes Danis, Kirke Corin, MD  promethazine (PHENERGAN) 25 MG tablet Take 25 mg by mouth at bedtime. Takes prn and at times does 1/2 tablet instead of whole one   Yes [provider]  traZODone (DESYREL) 50 MG tablet Take 100-150 mg by mouth at bedtime. 4 tabs hs 08/15/15  Yes [provider]  TRESIBA FLEXTOUCH 200 UNIT/ML SOPN Inject 100 Units into the skin 2 (two) times daily as needed (blood sugar level). As directed 10/28/15  Yes [provider]    Allergies    Erythromycin base, Azithromycin, Pollen extract, Remeron [mirtazapine], Zolpidem tartrate, Other, Sulfonamide derivatives, and Tree extract  Review of Systems   Review of Systems  Constitutional:  Positive for fatigue. Negative for fever.  Eyes:  Negative for photophobia.  Respiratory:  Positive for shortness of breath. Negative for cough.   Cardiovascular:  Positive for chest pain.  Gastrointestinal:  Positive for abdominal pain and blood in stool. Negative for nausea and vomiting.  Genitourinary:  Negative for dysuria.  Skin:  Negative for rash.  Neurological:  Positive for light-headedness. Negative for syncope and headaches.   Physical Exam Updated Vital Signs BP (!) 176/50   Pulse 66   Temp 99.6 F (37.6 C) (Oral)   Resp 17   Ht '5\' 2"'$  (1.575 m)   Wt 70 kg   SpO2 96%   BMI 28.23 kg/m   Physical Exam Vitals and nursing note reviewed.  Constitutional:      General: She is not in acute distress.    Appearance: She is well-developed. She is not diaphoretic.  HENT:     Head: Normocephalic and atraumatic.  Eyes:     Conjunctiva/sclera: Conjunctivae normal.  Cardiovascular:     Rate and  Rhythm: Normal rate and regular rhythm.     Heart sounds: Normal heart sounds. No murmur heard.    No friction rub. No gallop.  Pulmonary:     Effort: Pulmonary effort is normal. No respiratory distress.     Breath sounds: Normal breath sounds. No wheezing or rales.  Abdominal:     General: There is no distension.     Palpations: Abdomen is soft.     Tenderness: There is no abdominal tenderness. There is no guarding.  Genitourinary:    Rectum: Guaiac result negative (scant brown stool).  Musculoskeletal:        General: No tenderness.     Cervical back: Normal range of motion.  Skin:    General: Skin is warm and dry.     Findings: No erythema or rash.  Neurological:     Mental Status: She is alert and oriented to person, place, and time.    ED Results / Procedures / Treatments   Labs (all labs ordered are listed, but only abnormal results are displayed) Labs Reviewed  COMPREHENSIVE METABOLIC PANEL - Abnormal; Notable for the following components:      Result Value   Sodium 132 (*)    Glucose, Bld 311 (*)    Albumin 3.4 (*)    All other components within normal limits  CBC - Abnormal; Notable for the following components:   WBC 14.2 (*)    RBC 3.10 (*)    Hemoglobin 7.4 (*)    HCT 25.5 (*)    MCH 23.9 (*)    MCHC 29.0 (*)    RDW 16.5 (*)    All other components within normal limits  IRON AND TIBC - Abnormal; Notable for the following components:   Iron 26 (*)    TIBC 613 (*)    Saturation Ratios 4 (*)    All other components within normal limits  RETICULOCYTES - Abnormal; Notable for the following components:   RBC. 3.74 (*)    Immature Retic Fract 37.6 (*)    All other components within normal limits  HEMOGLOBIN A1C - Abnormal; Notable for the following components:   Hgb A1c MFr Bld 7.6 (*)    All other components within normal limits  CBG MONITORING, ED - Abnormal; Notable for the following components:   Glucose-Capillary 185 (*)    All other components within normal limits  CBG MONITORING, ED - Abnormal; Notable for the following components:    Glucose-Capillary 161 (*)    All other components within normal limits  SARS CORONAVIRUS 2 (TAT 6-24 HRS)  PROTIME-INR  VITAMIN B12  FERRITIN  FOLATE  HEMOGLOBIN AND HEMATOCRIT, BLOOD  CBC WITH DIFFERENTIAL/PLATELET  BASIC METABOLIC PANEL  HEPATITIS B SURFACE ANTIGEN  HEPATITIS C ANTIBODY  HEPATITIS B E ANTIGEN  HEPATITIS B CORE ANTIBODY, TOTAL  POC OCCULT BLOOD, ED  TYPE AND SCREEN  ABO/RH  PREPARE RBC (CROSSMATCH)  TROPONIN I (HIGH SENSITIVITY)  TROPONIN I (HIGH SENSITIVITY)    EKG EKG Interpretation  Date/Time:  Saturday March 01 2021 11:25:39 EDT Ventricular Rate:  49 PR Interval:  136 QRS Duration: 94 QT Interval:  490 QTC Calculation: 442 R Axis:   59 Text Interpretation: Sinus bradycardia Incomplete right bundle branch block Borderline ECG No significant change since last tracing Confirmed by Gareth Morgan [phone]) on 03/01/2021 1:00:36 PM  Radiology DG Chest 2 View  Result Date: 03/01/2021 CLINICAL DATA:  Chest pain, shortness of breath. EXAM: CHEST - 2 VIEW COMPARISON:  Chest x-rays dated  02/26/2021 and 05/02/2014. FINDINGS: Heart size and mediastinal contours are stable. Coarse interstitial markings bilaterally, perihilar and lower lobe predominant. No confluent opacity to suggest a superimposed pneumonia. No pleural effusion or pneumothorax is seen. No acute-appearing osseous abnormality. Aortic atherosclerosis. IMPRESSION: 1. No active cardiopulmonary disease. No evidence of pneumonia or pulmonary edema. 2. Coarse interstitial lung markings bilaterally, perihilar and lower lobe predominant, suggesting chronic bronchitic change and/or chronic interstitial lung disease. 3. Aortic atherosclerosis. Electronically Signed   By: Franki Cabot M.D.   On: 03/01/2021 12:34   CT ABDOMEN PELVIS W CONTRAST  Result Date: 03/01/2021 CLINICAL DATA:  Provided history of: Abdominal pain, acute (Ped 0-18y) EXAM: CT ABDOMEN AND PELVIS WITH CONTRAST TECHNIQUE: Multidetector CT  imaging of the abdomen and pelvis was performed using the standard protocol following bolus administration of intravenous contrast. CONTRAST:  81m OMNIPAQUE IOHEXOL 350 MG/ML SOLN COMPARISON:  CT 04/19/2019 FINDINGS: Lower chest: Linear scarring or atelectasis with slight improvement from prior exam. Slight left pleural thickening without significant effusion. No acute airspace disease. Wall thickening of the distal esophagus is again seen. Paraesophageal varices. Hepatobiliary: Hepatic cirrhosis with heterogeneous decreased hepatic density a nodular contours. In previous geographic area of decreased density in the right lobe is less prominent than on prior exam. Hypertrophy of the right lobe with generalized atrophy of the left lobe. Cholecystectomy. Common bile duct measures 12 mm, previously 10 mm. No visualized choledocholithiasis Pancreas: No ductal dilatation or inflammation. Spleen: Normal in size without focal abnormality. Adrenals/Urinary Tract: Normal adrenal glands. Hydronephrosis or perinephric edema. Slight extrarenal pelvis configuration of both kidneys. There is homogeneous renal enhancement. Subcentimeter hypodensity in the lower left kidney is too small to characterize but likely cyst. Unremarkable urinary bladder. No bladder wall thickening. No intravesicular air. Stomach/Bowel: Distal esophageal wall thickening with distal esophageal varices. Stomach is nondistended. There is a periampullary duodenal diverticulum without inflammation. Mild fecalization of small bowel contents without obstruction or inflammation. History of appendectomy. Distal descending and sigmoid colonic diverticulosis. There is colonic wall thickening of the sigmoid colon that appears similar to prior exam. Faint pericolonic edema in the left lower quadrant adjacent to the proximal sigmoid colon, series 3, image 71, may represent diverticulitis, though discretely inflamed diverticulum is not seen. Vascular/Lymphatic: Aortic  atherosclerosis. No aortic aneurysm. Patent portal vein. Circumaortic left renal vein. No abdominopelvic adenopathy. Reproductive: Hysterectomy.  Quiescent appearance of the ovaries. Other: Trace perihepatic fluid/ascites, series 3, image 21. No free air or focal fluid collection. Induration of the lower anterior abdominal wall typical of medication injection sites, and seen on prior exam. Musculoskeletal: There are no acute or suspicious osseous abnormalities. IMPRESSION: 1. Faint pericolonic edema in the left lower quadrant adjacent to the proximal sigmoid colon, may represent diverticulitis, though discretely inflamed diverticulum is not seen. No perforation or abscess. 2. Hepatic cirrhosis. Paraesophageal varices. Trace perihepatic fluid/ascites. 3. Distal esophageal wall thickening with distal esophageal varices, unchanged from prior exam. 4. Cholecystectomy with mild common bile duct dilatation, likely related to prior cholecystectomy. No visualized choledocholithiasis. Aortic Atherosclerosis (ICD10-I70.0). Electronically Signed   By: MKeith RakeM.D.   On: 03/01/2021 16:56   [place]CITES (ABDOMEN LIMITED)  Result Date: 03/01/2021 CLINICAL DATA:  Cirrhosis, trace ascites seen on same day CT abdomen pelvis. EXAM: LIMITED ABDOMEN ULTRASOUND FOR ASCITES TECHNIQUE: Limited ultrasound survey for ascites was performed in all four abdominal quadrants. COMPARISON:  Same day CT abdomen pelvis. FINDINGS: No ascites is identified. IMPRESSION: No sonographic evidence of ascites. Electronically Signed   By: TDorothea Ogle  Litton M.D.   On: 03/01/2021 19:50    Procedures .Critical Care  Date/Time: 03/01/2021 10:33 PM Performed by: Gareth Morgan, MD Authorized by: Gareth Morgan, MD   Critical care provider statement:    Critical care time (minutes):  30   Critical care was time spent personally by me on the following activities:  Discussions with consultants, examination of patient, ordering and performing  treatments and interventions, ordering and review of laboratory studies, ordering and review of radiographic studies, pulse oximetry, re-evaluation of patient's condition, obtaining history from patient or surrogate and review of old charts   Medications Ordered in ED Medications  pantoprazole (PROTONIX) 80 mg /NS 100 mL IVPB (has no administration in time range)  pantoprozole (PROTONIX) 80 mg /NS 100 mL infusion (has no administration in time range)  pantoprazole (PROTONIX) injection 40 mg (has no administration in time range)  insulin glargine-yfgn (SEMGLEE) injection 15 Units (has no administration in time range)  aspirin EC tablet 81 mg (81 mg Oral Patient Refused/Not Given 03/01/21 1920)  HYDROcodone-acetaminophen (NORCO/VICODIN) 5-325 MG per tablet 1 tablet (has no administration in time range)  atorvastatin (LIPITOR) tablet 20 mg (has no administration in time range)  isosorbide mononitrate (IMDUR) 24 hr tablet 15 mg (has no administration in time range)  ALPRAZolam (XANAX) tablet 1 mg (has no administration in time range)  DULoxetine (CYMBALTA) DR capsule 60 mg (has no administration in time range)  traZODone (DESYREL) tablet 200 mg (has no administration in time range)  dicyclomine (BENTYL) capsule 10 mg (has no administration in time range)  ferrous sulfate tablet 325 mg (has no administration in time range)  albuterol (VENTOLIN HFA) 108 (90 Base) MCG/ACT inhaler 2 puff (has no administration in time range)  loratadine (CLARITIN) tablet 10 mg (has no administration in time range)  montelukast (SINGULAIR) tablet 10 mg (has no administration in time range)  promethazine (PHENERGAN) tablet 25 mg (has no administration in time range)  insulin aspart (novoLOG) injection 0-20 Units (has no administration in time range)  cefTRIAXone (ROCEPHIN) 2 g in sodium chloride 0.9 % 100 mL IVPB (has no administration in time range)  metroNIDAZOLE (FLAGYL) IVPB 500 mg (has no administration in time  range)  metoprolol tartrate (LOPRESSOR) injection 2.5 mg (has no administration in time range)  hydrALAZINE (APRESOLINE) tablet 25 mg (has no administration in time range)  octreotide (SANDOSTATIN) 500 mcg in sodium chloride 0.9 % 250 mL (2 mcg/mL) infusion (has no administration in time range)  0.9 %  sodium chloride infusion (0 mL/hr Intravenous Stopped 03/01/21 1918)  iohexol (OMNIPAQUE) 350 MG/ML injection 75 mL (75 mLs Intravenous Contrast Given 03/01/21 1620)    ED Course  I have reviewed the triage vital signs and the nursing notes.  Pertinent labs & imaging results that were available during my care of the patient were reviewed by me and considered in my medical decision making (see chart for details).    MDM Rules/Calculators/A&P                             70 year old female with a history of coronary artery disease, hypertension, depression, anxiety, asthma, hyperlipidemia, fatty liver, type 2 diabetes, sleep apnea, who presents with concern for blood in stool, generalized abdominal pain, chest pain and shortness of breath for 1 month. Hemoglobin stable from Wednesday 7.6 to 7.4, scant brown stool on my exam hemoccult negative, however had positive hemoccult as outpatient.  Given hx of CAD, hgb 7,  symptomatic anemia, discussed and consented for blood transfusion. Ordered CT which is pending at time of admission.  Protonix gtt initiated.  Dr. [facility] consulted.  Admitted for further care.     Final Clinical Impression(s) / ED Diagnoses Final diagnoses:  Symptomatic anemia    Rx / DC Orders ED Discharge Orders     None        Gareth Morgan, MD 03/01/21 2233

## 2021-03-01 NOTE — ED Notes (Addendum)
Pt given graham crackers, peanut butter, and ginger ale. Per pt request

## 2021-03-01 NOTE — ED Notes (Signed)
Patient transported to Ultrasound 

## 2021-03-01 NOTE — ED Notes (Signed)
Patient transported to CT 

## 2021-03-02 ENCOUNTER — Inpatient Hospital Stay (HOSPITAL_COMMUNITY): Payer: Medicare HMO | Admitting: Anesthesiology

## 2021-03-02 ENCOUNTER — Encounter (HOSPITAL_COMMUNITY)
Admission: EM | Disposition: A | Discharge status: Home Health | Payer: Self-pay | Source: Home / Self Care | Attending: Internal Medicine

## 2021-03-02 ENCOUNTER — Encounter (HOSPITAL_COMMUNITY): Payer: Self-pay | Admitting: Internal Medicine

## 2021-03-02 ENCOUNTER — Other Ambulatory Visit: Payer: Self-pay

## 2021-03-02 DIAGNOSIS — K2970 Gastritis, unspecified, without bleeding: Secondary | ICD-10-CM

## 2021-03-02 DIAGNOSIS — D649 Anemia, unspecified: Secondary | ICD-10-CM

## 2021-03-02 DIAGNOSIS — I8510 Secondary esophageal varices without bleeding: Secondary | ICD-10-CM

## 2021-03-02 DIAGNOSIS — D500 Iron deficiency anemia secondary to blood loss (chronic): Secondary | ICD-10-CM

## 2021-03-02 DIAGNOSIS — K2990 Gastroduodenitis, unspecified, without bleeding: Secondary | ICD-10-CM

## 2021-03-02 HISTORY — PX: ESOPHAGOGASTRODUODENOSCOPY: SHX5428

## 2021-03-02 HISTORY — PX: BIOPSY: SHX5522

## 2021-03-02 LAB — BASIC METABOLIC PANEL
Anion gap: 8 (ref 5–15)
BUN: 10 mg/dL (ref 8–23)
CO2: 26 mmol/L (ref 22–32)
Calcium: 9.7 mg/dL (ref 8.9–10.3)
Chloride: 101 mmol/L (ref 98–111)
Creatinine, Ser: 0.67 mg/dL (ref 0.44–1.00)
GFR, Estimated: >60 mL/min (ref >60)
Glucose, Bld: 172 mg/dL — ABNORMAL HIGH (ref 70–99)
Potassium: 3.8 mmol/L (ref 3.5–5.1)
Sodium: 135 mmol/L (ref 135–145)

## 2021-03-02 LAB — CBC WITH DIFFERENTIAL/PLATELET
Abs Immature Granulocytes: 0.06 10*3/uL (ref 0.00–0.07)
Basophils Absolute: 0.1 10*3/uL (ref 0.0–0.1)
Basophils Relative: 1 %
Eosinophils Absolute: 0.6 10*3/uL — ABNORMAL HIGH (ref 0.0–0.5)
Eosinophils Relative: 3 %
HCT: 31.2 % — ABNORMAL LOW (ref 36.0–46.0)
Hemoglobin: 9.6 g/dL — ABNORMAL LOW (ref 12.0–15.0)
Immature Granulocytes: 0 %
Lymphocytes Relative: 52 %
Lymphs Abs: 8.9 10*3/uL — ABNORMAL HIGH (ref 0.7–4.0)
MCH: 25.3 pg — ABNORMAL LOW (ref 26.0–34.0)
MCHC: 30.8 g/dL (ref 30.0–36.0)
MCV: 82.1 fL (ref 80.0–100.0)
Monocytes Absolute: 1.3 10*3/uL — ABNORMAL HIGH (ref 0.1–1.0)
Monocytes Relative: 7 %
Neutro Abs: 6.5 10*3/uL (ref 1.7–7.7)
Neutrophils Relative %: 37 %
Platelets: 305 10*3/uL (ref 150–400)
RBC: 3.8 MIL/uL — ABNORMAL LOW (ref 3.87–5.11)
RDW: 17.2 % — ABNORMAL HIGH (ref 11.5–15.5)
WBC: 17.5 10*3/uL — ABNORMAL HIGH (ref 4.0–10.5)
nRBC: 0 % (ref 0.0–0.2)

## 2021-03-02 LAB — TYPE AND SCREEN
ABO/RH(D): O POS
Antibody Screen: NEGATIVE
Unit division: 0

## 2021-03-02 LAB — CBG MONITORING, ED: Glucose-Capillary: 92 mg/dL (ref 70–99)

## 2021-03-02 LAB — BPAM RBC
Blood Product Expiration Date: 202209162359
ISSUE DATE / TIME: 202208131601
Unit Type and Rh: 5100

## 2021-03-02 LAB — SARS CORONAVIRUS 2 (TAT 6-24 HRS): SARS Coronavirus 2: NEGATIVE

## 2021-03-02 LAB — GLUCOSE, CAPILLARY
Glucose-Capillary: 132 mg/dL — ABNORMAL HIGH (ref 70–99)
Glucose-Capillary: 158 mg/dL — ABNORMAL HIGH (ref 70–99)
Glucose-Capillary: 217 mg/dL — ABNORMAL HIGH (ref 70–99)
Glucose-Capillary: 277 mg/dL — ABNORMAL HIGH (ref 70–99)
Glucose-Capillary: 352 mg/dL — ABNORMAL HIGH (ref 70–99)

## 2021-03-02 LAB — HEPATITIS B CORE ANTIBODY, TOTAL: Hep B Core Total Ab: NONREACTIVE

## 2021-03-02 LAB — HEPATITIS C ANTIBODY: HCV Ab: NONREACTIVE

## 2021-03-02 LAB — HEPATITIS B SURFACE ANTIGEN: Hepatitis B Surface Ag: NONREACTIVE

## 2021-03-02 SURGERY — EGD (ESOPHAGOGASTRODUODENOSCOPY)
Anesthesia: Monitor Anesthesia Care | Laterality: Left

## 2021-03-02 MED ORDER — PROPOFOL 500 MG/50ML IV EMUL
INTRAVENOUS | Status: DC | PRN
  Administered 2021-03-02: 125 ug/kg/min via INTRAVENOUS

## 2021-03-02 MED ORDER — METHOCARBAMOL 500 MG PO TABS
500.0000 mg | ORAL_TABLET | Freq: Three times a day (TID) | ORAL | Status: DC | PRN
  Administered 2021-03-02: 500 mg via ORAL
  Filled 2021-03-02: qty 1

## 2021-03-02 MED ORDER — LACTATED RINGERS IV SOLN
INTRAVENOUS | Status: DC | PRN
Start: 2021-03-02 — End: 2021-03-02

## 2021-03-02 MED ORDER — SODIUM CHLORIDE 0.9 % IV SOLN: INTRAVENOUS | Status: DC

## 2021-03-02 MED ORDER — LIDOCAINE 2% (20 MG/ML) 5 ML SYRINGE
INTRAMUSCULAR | Status: DC | PRN
  Administered 2021-03-02: 50 mg via INTRAVENOUS

## 2021-03-02 MED ORDER — PROPOFOL 10 MG/ML IV BOLUS
INTRAVENOUS | Status: DC | PRN
Start: 2021-03-02 — End: 2021-03-02
  Administered 2021-03-02 (×2): 20 mg via INTRAVENOUS
  Administered 2021-03-02: 10 mg via INTRAVENOUS
  Administered 2021-03-02 (×2): 20 mg via INTRAVENOUS

## 2021-03-02 MED ORDER — INSULIN ASPART 100 UNIT/ML IJ SOLN
0.0000 [IU] | INTRAMUSCULAR | Status: DC
  Administered 2021-03-02: 5 [IU] via SUBCUTANEOUS
  Administered 2021-03-02: 3 [IU] via SUBCUTANEOUS
  Administered 2021-03-02: 8 [IU] via SUBCUTANEOUS
  Administered 2021-03-02: 15 [IU] via SUBCUTANEOUS
  Administered 2021-03-03: 2 [IU] via SUBCUTANEOUS
  Administered 2021-03-03: 5 [IU] via SUBCUTANEOUS
  Administered 2021-03-03: 8 [IU] via SUBCUTANEOUS

## 2021-03-02 MED ORDER — ENSURE ENLIVE PO LIQD
237.0000 mL | Freq: Two times a day (BID) | ORAL | Status: DC
  Administered 2021-03-03: 237 mL via ORAL

## 2021-03-02 MED ORDER — PANTOPRAZOLE SODIUM 40 MG PO TBEC
40.0000 mg | DELAYED_RELEASE_TABLET | Freq: Two times a day (BID) | ORAL | Status: DC
  Administered 2021-03-02 – 2021-03-03 (×3): 40 mg via ORAL
  Filled 2021-03-02 (×3): qty 1

## 2021-03-02 NOTE — ED Notes (Signed)
Received verbal report from Paden B RN at this time 

## 2021-03-02 NOTE — Transfer of Care (Signed)
Immediate Anesthesia Transfer of Care Note  Patient: [name]  Procedure(s) Performed: ESOPHAGOGASTRODUODENOSCOPY (EGD) (Left) BIOPSY  Patient Location: PACU  Anesthesia Type:MAC  Level of Consciousness: awake, alert  and oriented  Airway & Oxygen Therapy: Patient Spontanous Breathing and Patient connected to nasal cannula oxygen  Post-op Assessment: Report given to RN, Post -op Vital signs reviewed and stable and Patient moving all extremities X 4  Post vital signs: Reviewed and stable  Last Vitals:  Vitals Value Taken Time  BP 173/42 03/02/21 1134  Temp    Pulse 86 03/02/21 1136  Resp 16 03/02/21 1136  SpO2 95 % 03/02/21 1136  Vitals shown include unvalidated device data.  Last Pain:  Vitals:   03/02/21 1040  TempSrc: Temporal  PainSc: 8          Complications: No notable events documented.

## 2021-03-02 NOTE — Anesthesia Preprocedure Evaluation (Addendum)
Anesthesia Evaluation  Patient identified by MRN, date of birth, ID band Patient awake    Reviewed: Allergy & Precautions, NPO status , Patient's Chart, lab work & pertinent test results  History of Anesthesia Complications Negative for: history of anesthetic complications  Airway Mallampati: II  TM Distance: >3 FB Neck ROM: Full    Dental  (+) Dental Advisory Given   Pulmonary asthma , sleep apnea , former smoker,    breath sounds clear to auscultation       Cardiovascular hypertension, Pt. on medications and Pt. on home beta blockers + angina with exertion + CAD, + Past MI and + Cardiac Stents   Rhythm:Regular  Left ventricle: The cavity size was normal. Wall thickness was  increased in a pattern of mild LVH. Systolic function was normal.  The estimated ejection fraction was in the range of 60% to 65%.  Wall motion was normal; there were no regional wall motion  abnormalities. Doppler parameters are consistent with abnormal  left ventricular relaxation (grade 1 diastolic dysfunction).  Doppler parameters are consistent with high ventricular filling  pressure.  - Aortic valve: Mildly to moderately calcified annulus. Trileaflet.   ? The left ventricular systolic function is normal. ? LV end diastolic pressure is mildly elevated. ? The left ventricular ejection fraction is 55-65% by visual estimate. ? Mid RCA lesion is 40% stenosed. ? Prox Cx lesion is 30% stenosed. ? Ost 1st Diag lesion is 40% stenosed. ? Previously placed Mid LAD stent (unknown type) is widely patent.   1.  Widely patent mid LAD stent with no restenosis.  Mild to moderate nonobstructive disease affecting the RCA and left circumflex. 2.  Normal LV systolic function and mildly elevated left ventricular end-diastolic pressure.    Neuro/Psych  Headaches, PSYCHIATRIC DISORDERS Anxiety Depression  Neuromuscular disease    GI/Hepatic hiatal  hernia, GERD  Medicated,? Gi bleed   Endo/Other  diabetes  Renal/GU negative Renal ROSLab Results      Component                Value               Date                      CREATININE               0.67                03/02/2021                Musculoskeletal  (+) Arthritis , Fibromyalgia -  Abdominal   Peds  Hematology  (+) Blood dyscrasia, anemia , Lab Results      Component                Value               Date                      WBC                      17.5 (H)            03/02/2021                HGB                      9.6 (L)  03/02/2021                HCT                      31.2 (L)            03/02/2021                MCV                      82.1                03/02/2021                PLT                      305                 03/02/2021              Anesthesia Other Findings Symptoms concerning for unstable angina with ntg use at home. No angina today. Discussed with GI who will get Cards to f/u with patient in house  Reproductive/Obstetrics                            Anesthesia Physical Anesthesia Plan  ASA: 4  Anesthesia Plan: MAC   Post-op Pain Management:    Induction: Intravenous  PONV Risk Score and Plan: 2 and Propofol infusion and Treatment may vary due to age or medical condition  Airway Management Planned: Nasal Cannula  Additional Equipment: None  Intra-op Plan:   Post-operative Plan:   Informed Consent: I have reviewed the patients History and Physical, chart, labs and discussed the procedure including the risks, benefits and alternatives for the proposed anesthesia with the patient or authorized representative who has indicated his/her understanding and acceptance.     Dental advisory given  Plan Discussed with: CRNA and Anesthesiologist  Anesthesia Plan Comments:        Anesthesia Quick Evaluation

## 2021-03-02 NOTE — Progress Notes (Signed)
[facility] Progress Note  Patient: [name]    [id number]  DOA: 03/01/2021     Date of Service: the patient was seen and examined on 03/02/2021  Brief hospital course: DENITRA CIAK is a 70 y.o. female with medical history significant of diverticulosis and colon polyps, CAD with stenting, HTN, IDDM,Barrett esophagus, IBS, OSA on CPAP presented with persistent abdominal pain and diarrhea, dark-colored stool, and increasing shortness of breath. Although symptomatic anemia, diverticulitis. GI consulted underwent EGD. Currently plan is continue further work-up per GI as well as consult cardiology for further assistance.  Subjective: Denies any chest pain or chest heaviness.  But reports chest area of dyspnea on exertion as well as angina on exertion.  No nausea no vomiting.  No fever no chills.  Abdominal pain reported.  No blood in the stool right now.  Assessment and Plan: 1.  Symptomatic anemia Iron deficiency anemia Melena Abdominal pain. SP 2 episodes of transfusion. Further work-up for anemia. EGD performed today shows evidence of gastritis with bleeding. Continue PPI. Varices without any acute bleeding.  2.  New diagnosis of cirrhosis as well as esophageal varices. No significant ascites. Was started on octreotide infusion. Management per GI. No significant bleeding seen on EGD.  3.  Diverticulitis Started on IV ceftriaxone and Flagyl. Currently on ceftriaxone per GI. Monitor for now. On clear liquid diet.  4.  CAD Angina Not a good candidate for any anticoagulation right now. Likely her angina is associated with her anemia. Will consult cardiology for further assistance. Currently on Imdur.  5.  Mood disorder and anxiety Continue Xanax and Cymbalta.  Continue trazodone nightly.  Scheduled Meds:  ALPRAZolam  1 mg Oral QHS   DULoxetine  60 mg Oral BID   feeding supplement  237 mL Oral BID BM   HYDROcodone-acetaminophen  1 tablet Oral BID   insulin  aspart  0-15 Units Subcutaneous Q4H   isosorbide mononitrate  15 mg Oral Daily   montelukast  10 mg Oral QHS   pantoprazole  40 mg Oral BID   traZODone  200 mg Oral QHS   Continuous Infusions:  cefTRIAXone (ROCEPHIN)  IV 2 g (03/02/21 1722)   metronidazole Stopped (03/02/21 1221)   PRN Meds: albuterol, dicyclomine, hydrALAZINE, metoprolol tartrate  Body mass index is 28.23 kg/m.        DVT Prophylaxis:   SCDs Start: 03/01/21 1634    Advance goals of care discussion: Pt is Full code.  Family Communication: no family was present at bedside, at the time of interview.   Data Reviewed: I have personally reviewed and interpreted daily labs, tele strips, imaging. On presentation hemoglobin 7.4.  Baseline around 12-14.  After transfusion hemoglobin 9.6.  CBC LFTs stable.  INR normal.  Physical Exam:  General: Appear in mild distress, no Rash; Oral Mucosa Clear, moist. no Abnormal Neck Mass Or lumps, Conjunctiva normal  Cardiovascular: S1 and S2 Present, no Murmur, Respiratory: good respiratory effort, Bilateral Air entry present and CTA, no Crackles, no wheezes Abdomen: Bowel Sound present, Soft and no tenderness Extremities: no Pedal edema Neurology: alert and oriented to time, place, and person affect appropriate. no new focal deficit Gait not checked due to patient safety concerns   Vitals:   03/02/21 1146 03/02/21 1156 03/02/21 1220 03/02/21 1500  BP: (!) 174/37 (!) 176/46 (!) 124/95 (!) 160/61  Pulse: 82 82 84 86  Resp: '19 17 20 15  '$ Temp:   98.4 F (36.9 C) 98.7 F (37.1 C)  TempSrc:   Oral Oral  SpO2: 96% 93% 95% 92%  Weight:      Height:        Disposition:  Status is: Inpatient  Remains inpatient appropriate because:IV treatments appropriate due to intensity of illness or inability to take PO and Inpatient level of care appropriate due to severity of illness  Dispo: The patient is from: Home              Anticipated d/c is to: Home              Patient  currently is not medically stable to d/c.   Difficult to place patient No        Time spent: 35 minutes. I reviewed all nursing notes, pharmacy notes, vitals, pertinent old records. I have discussed plan of care as described above with RN.  Author: Berle Mull, MD [facility] 03/02/2021 6:03 PM  To reach On-call, see care teams to locate the attending and reach out via [email or web address]. Between 7PM-7AM, please contact night-coverage If you still have difficulty reaching the attending provider, please page the [facility] (Director on Call) for [facility] on amion for assistance.

## 2021-03-02 NOTE — ED Notes (Signed)
Spoke with Dr. Nevada Crane reference Sem[place]e advised to not admin this medication

## 2021-03-02 NOTE — ED Notes (Signed)
Spoke with provider and advised to hold all PO meds and insulin at this time

## 2021-03-02 NOTE — Interval H&P Note (Signed)
History and Physical Interval Note:  Transfused 2U pRBCs with post transfusion H/H 9.6/31.3. Viral hep panel negative. INR 1. Ferritin 14, iron 26, TIBC 613, sat 4%. Normal B12/folate.   EGD today to eval for EV, PHG, PUD, gastritis, etc. If unrevealing, may require repeat colonoscopy +/- VCE.  Will also plan for inpatient Cardiology consult for unstable angina. Trop negative on admission.   03/02/2021 11:02 AM  [name]  has presented today for surgery, with the diagnosis of Melena, anemia.  The various methods of treatment have been discussed with the patient and family. After consideration of risks, benefits and other options for treatment, the patient has consented to  Procedure(s): ESOPHAGOGASTRODUODENOSCOPY (EGD) (Left) as a surgical intervention.  The patient's history has been reviewed, patient examined, no change in status, stable for surgery.  I have reviewed the patient's chart and labs.  Questions were answered to the patient's satisfaction.     Dominic Pea Erminio Nygard

## 2021-03-02 NOTE — Op Note (Signed)
[facility] Patient Name: [name] Procedure Date : 03/02/2021 MRN: [id number] Attending MD: Gerrit Heck , MD Date of Birth: 1951-05-22 CSN: [id number] Age: 70 Admit Type: Inpatient Procedure:                Upper GI endoscopy Indications:              Acute post hemorrhagic anemia, Iron deficiency                            anemia, Melena Providers:                Gerrit Heck, MD, Jeanella Cara, RN,                            Tyna Jaksch Technician Referring MD:              Medicines:                Monitored Anesthesia Care Complications:            No immediate complications. Estimated Blood Loss:     Estimated blood loss was minimal. Procedure:                Pre-Anesthesia Assessment:                           - Prior to the procedure, a History and Physical                            was performed, and patient medications and                            allergies were reviewed. The patient's tolerance of                            previous anesthesia was also reviewed. The risks                            and benefits of the procedure and the sedation                            options and risks were discussed with the patient.                            All questions were answered, and informed consent                            was obtained. Prior Anticoagulants: The patient has                            taken no previous anticoagulant or antiplatelet                            agents except for aspirin. ASA Grade Assessment:                            III -  A patient with severe systemic disease. After                            reviewing the risks and benefits, the patient was                            deemed in satisfactory condition to undergo the                            procedure.                           After obtaining informed consent, the endoscope was                            passed under direct vision. Throughout the                             procedure, the patient's blood pressure, pulse, and                            oxygen saturations were monitored continuously. The                            [id number] Olympus endoscope was introduced                            through the mouth, and advanced to the second part                            of duodenum. The upper GI endoscopy was                            accomplished without difficulty. The patient                            tolerated the procedure well. Scope In: Scope Out: Findings:      Single column of grade I, small (< 5 mm) varices were found in the lower       third of the esophagus. No stigmata of recent bleeding.      The upper third of the esophagus, middle third of the esophagus and       gastroesophageal junction were normal.      Localized area of moderate inflammation characterized by erythema and       shallow ulcerations was found in the gastric fundus and in the gastric       body. There was friability with mild contact oozing. Biopsies were taken       with a cold forceps for histology. Estimated blood loss was minimal.      The incisura, gastric antrum and pylorus were normal.      The examined duodenum was normal. Biopsies were taken with a cold       forceps for histology. Estimated blood loss was minimal. Impression:               - Grade I and small (<  5 mm) esophageal varices.                           - Normal upper third of esophagus, middle third of                            esophagus and gastroesophageal junction.                           - Gastritis. Biopsied.                           - Normal incisura, antrum and pylorus.                           - Normal examined duodenum. Biopsied. Recommendation:           - Return patient to hospital ward for ongoing care.                           - Full liquid diet today. If no evidence of ongoing                            bleeding and otherwise stable Hgb/Hct, ok to                             advance diet as tolerated tomorrow.                           - Use Protonix (pantoprazole) 40 mg PO BID for 8                            weeks.                           - Await pathology results.                           - Recommend inpatient Cardiology consult. Procedure Code(s):        --- Professional ---                           [id number], Esophagogastroduodenoscopy, flexible,                            transoral; with biopsy, single or multiple Diagnosis Code(s):        --- Professional ---                           I85.00, Esophageal varices without bleeding                           K29.70, Gastritis, unspecified, without bleeding                           D62, Acute posthemorrhagic anemia  D50.9, Iron deficiency anemia, unspecified                           K92.1, Melena (includes Hematochezia) CPT copyright 2019 [employer]. All rights reserved. The codes documented in this report are preliminary and upon coder review may  be revised to meet current compliance requirements. Gerrit Heck, MD 03/02/2021 11:37:09 AM Number of Addenda: 0

## 2021-03-02 NOTE — Anesthesia Procedure Notes (Signed)
Procedure Name: MAC Date/Time: 03/02/2021 11:06 AM Performed by: Mariea Clonts, CRNA Pre-anesthesia Checklist: Patient identified, Emergency Drugs available, Suction available, Timeout performed and Patient being monitored Patient Re-evaluated:Patient Re-evaluated prior to induction Oxygen Delivery Method: Nasal cannula and Simple face mask

## 2021-03-02 NOTE — ED Notes (Signed)
Consent signed and witnessed and is at bedside at this time

## 2021-03-02 NOTE — ED Notes (Signed)
Spoke with provider reference pt c/o leg pain and cramping

## 2021-03-02 NOTE — Anesthesia Postprocedure Evaluation (Signed)
Anesthesia Post Note  Patient: [name]  Procedure(s) Performed: ESOPHAGOGASTRODUODENOSCOPY (EGD) (Left) BIOPSY     Patient location during evaluation: Endoscopy Anesthesia Type: MAC Level of consciousness: awake and alert Pain management: pain level controlled Vital Signs Assessment: post-procedure vital signs reviewed and stable Respiratory status: spontaneous breathing, nonlabored ventilation, respiratory function stable and patient connected to nasal cannula oxygen Cardiovascular status: stable and blood pressure returned to baseline Postop Assessment: no apparent nausea or vomiting Anesthetic complications: no   No notable events documented.  Last Vitals:  Vitals:   03/02/21 1156 03/02/21 1220  BP: (!) 176/46 (!) 124/95  Pulse: 82 84  Resp: 17 20  Temp:  36.9 C  SpO2: 93% 95%    Last Pain:  Vitals:   03/02/21 1227  TempSrc:   PainSc: 8                  Alec Jaros

## 2021-03-02 NOTE — ED Notes (Signed)
Endoscopy tech at bedside at this time to transport pt

## 2021-03-03 ENCOUNTER — Inpatient Hospital Stay (HOSPITAL_COMMUNITY): Payer: Medicare HMO

## 2021-03-03 DIAGNOSIS — D649 Anemia, unspecified: Secondary | ICD-10-CM

## 2021-03-03 DIAGNOSIS — I2510 Atherosclerotic heart disease of native coronary artery without angina pectoris: Secondary | ICD-10-CM | POA: Diagnosis not present

## 2021-03-03 DIAGNOSIS — K254 Chronic or unspecified gastric ulcer with hemorrhage: Secondary | ICD-10-CM | POA: Diagnosis not present

## 2021-03-03 DIAGNOSIS — K7460 Unspecified cirrhosis of liver: Secondary | ICD-10-CM

## 2021-03-03 LAB — COMPREHENSIVE METABOLIC PANEL
ALT: 15 U/L (ref 0–44)
AST: 41 U/L (ref 15–41)
Albumin: 3.1 g/dL — ABNORMAL LOW (ref 3.5–5.0)
Alkaline Phosphatase: 64 U/L (ref 38–126)
Anion gap: 8 (ref 5–15)
BUN: 5 mg/dL — ABNORMAL LOW (ref 8–23)
CO2: 26 mmol/L (ref 22–32)
Calcium: 8.9 mg/dL (ref 8.9–10.3)
Chloride: 100 mmol/L (ref 98–111)
Creatinine, Ser: 0.68 mg/dL (ref 0.44–1.00)
GFR, Estimated: >60 mL/min (ref >60)
Glucose, Bld: 193 mg/dL — ABNORMAL HIGH (ref 70–99)
Potassium: 4.2 mmol/L (ref 3.5–5.1)
Sodium: 134 mmol/L — ABNORMAL LOW (ref 135–145)
Total Bilirubin: 0.6 mg/dL (ref 0.3–1.2)
Total Protein: 6 g/dL — ABNORMAL LOW (ref 6.5–8.1)

## 2021-03-03 LAB — GLUCOSE, CAPILLARY
Glucose-Capillary: 127 mg/dL — ABNORMAL HIGH (ref 70–99)
Glucose-Capillary: 251 mg/dL — ABNORMAL HIGH (ref 70–99)
Glucose-Capillary: 234 mg/dL — ABNORMAL HIGH (ref 70–99)
Glucose-Capillary: 171 mg/dL — ABNORMAL HIGH (ref 70–99)
Glucose-Capillary: 281 mg/dL — ABNORMAL HIGH (ref 70–99)

## 2021-03-03 LAB — IGG, IGA, IGM
IgA: 312 mg/dL (ref 64–422)
IgG (Immunoglobin G), Serum: 923 mg/dL (ref 586–1602)
IgM (Immunoglobulin M), Srm: 136 mg/dL (ref 26–217)

## 2021-03-03 LAB — CBC WITH DIFFERENTIAL/PLATELET
Abs Immature Granulocytes: 0.03 10*3/uL (ref 0.00–0.07)
Basophils Absolute: 0.1 10*3/uL (ref 0.0–0.1)
Basophils Relative: 1 %
Eosinophils Absolute: 0.5 10*3/uL (ref 0.0–0.5)
Eosinophils Relative: 5 %
HCT: 28 % — ABNORMAL LOW (ref 36.0–46.0)
Hemoglobin: 8.6 g/dL — ABNORMAL LOW (ref 12.0–15.0)
Immature Granulocytes: 0 %
Lymphocytes Relative: 43 %
Lymphs Abs: 4.7 10*3/uL — ABNORMAL HIGH (ref 0.7–4.0)
MCH: 25.3 pg — ABNORMAL LOW (ref 26.0–34.0)
MCHC: 30.7 g/dL (ref 30.0–36.0)
MCV: 82.4 fL (ref 80.0–100.0)
Monocytes Absolute: 1.1 10*3/uL — ABNORMAL HIGH (ref 0.1–1.0)
Monocytes Relative: 10 %
Neutro Abs: 4.4 10*3/uL (ref 1.7–7.7)
Neutrophils Relative %: 41 %
Platelets: 250 10*3/uL (ref 150–400)
RBC: 3.4 MIL/uL — ABNORMAL LOW (ref 3.87–5.11)
RDW: 18.2 % — ABNORMAL HIGH (ref 11.5–15.5)
WBC: 10.8 10*3/uL — ABNORMAL HIGH (ref 4.0–10.5)
nRBC: 0 % (ref 0.0–0.2)

## 2021-03-03 LAB — TROPONIN I (HIGH SENSITIVITY)
Troponin I (High Sensitivity): 10 ng/L (ref <18)
Troponin I (High Sensitivity): 10 ng/L (ref <18)

## 2021-03-03 LAB — ALPHA-1-ANTITRYPSIN: A-1 Antitrypsin, Ser: 122 mg/dL (ref 101–187)

## 2021-03-03 LAB — ANA: Anti Nuclear Antibody (ANA): NEGATIVE

## 2021-03-03 LAB — ANTI-SMOOTH MUSCLE ANTIBODY, IGG: F-Actin IgG: 12 Units (ref 0–19)

## 2021-03-03 LAB — MITOCHONDRIAL ANTIBODIES: Mitochondrial M2 Ab, IgG: <20 Units (ref 0.0–20.0)

## 2021-03-03 LAB — HEPATITIS B E ANTIGEN: Hep B E Ag: NEGATIVE

## 2021-03-03 LAB — CERULOPLASMIN: Ceruloplasmin: 32.8 mg/dL (ref 19.0–39.0)

## 2021-03-03 MED ORDER — INSULIN ASPART 100 UNIT/ML IJ SOLN
0.0000 [IU] | Freq: Three times a day (TID) | INTRAMUSCULAR | Status: DC
  Administered 2021-03-03: 3 [IU] via SUBCUTANEOUS
  Administered 2021-03-04 – 2021-03-05 (×4): 5 [IU] via SUBCUTANEOUS
  Administered 2021-03-05 – 2021-03-06 (×4): 3 [IU] via SUBCUTANEOUS
  Administered 2021-03-06 – 2021-03-07 (×2): 11 [IU] via SUBCUTANEOUS
  Administered 2021-03-07: 8 [IU] via SUBCUTANEOUS
  Administered 2021-03-07 – 2021-03-08 (×2): 5 [IU] via SUBCUTANEOUS
  Administered 2021-03-08: 3 [IU] via SUBCUTANEOUS

## 2021-03-03 MED ORDER — HYDROCODONE-ACETAMINOPHEN 5-325 MG PO TABS
1.0000 | ORAL_TABLET | Freq: Four times a day (QID) | ORAL | Status: DC | PRN
  Administered 2021-03-06 – 2021-03-07 (×3): 1 via ORAL
  Filled 2021-03-03 (×3): qty 1

## 2021-03-03 MED ORDER — ALPRAZOLAM 0.5 MG PO TABS
1.0000 mg | ORAL_TABLET | Freq: Two times a day (BID) | ORAL | Status: DC | PRN
  Administered 2021-03-06 – 2021-03-07 (×2): 1 mg via ORAL
  Filled 2021-03-03 (×2): qty 2

## 2021-03-03 MED ORDER — ISOSORBIDE MONONITRATE ER 30 MG PO TB24
30.0000 mg | ORAL_TABLET | Freq: Every day | ORAL | Status: DC
  Administered 2021-03-04 – 2021-03-08 (×5): 30 mg via ORAL
  Filled 2021-03-03 (×5): qty 1

## 2021-03-03 MED ORDER — MORPHINE SULFATE (PF) 2 MG/ML IV SOLN
2.0000 mg | INTRAVENOUS | Status: DC | PRN
  Administered 2021-03-03 – 2021-03-05 (×6): 2 mg via INTRAVENOUS
  Administered 2021-03-05: 4 mg via INTRAVENOUS
  Filled 2021-03-03: qty 2
  Filled 2021-03-03 (×7): qty 1

## 2021-03-03 MED ORDER — INSULIN ASPART 100 UNIT/ML IJ SOLN
0.0000 [IU] | Freq: Every day | INTRAMUSCULAR | Status: DC
  Administered 2021-03-03: 3 [IU] via SUBCUTANEOUS
  Administered 2021-03-04: 2 [IU] via SUBCUTANEOUS
  Administered 2021-03-05: 5 [IU] via SUBCUTANEOUS
  Administered 2021-03-06: 4 [IU] via SUBCUTANEOUS
  Administered 2021-03-07: 2 [IU] via SUBCUTANEOUS

## 2021-03-03 MED ORDER — ISOSORBIDE MONONITRATE ER 30 MG PO TB24
15.0000 mg | ORAL_TABLET | Freq: Once | ORAL | Status: AC
  Administered 2021-03-03: 15 mg via ORAL
  Filled 2021-03-03: qty 1

## 2021-03-03 MED ORDER — ENSURE MAX PROTEIN PO LIQD
11.0000 [oz_av] | Freq: Two times a day (BID) | ORAL | Status: DC
  Filled 2021-03-03 (×7): qty 330

## 2021-03-03 MED ORDER — HYDROCHLOROTHIAZIDE 25 MG PO TABS
25.0000 mg | ORAL_TABLET | Freq: Every day | ORAL | Status: DC
  Administered 2021-03-03: 25 mg via ORAL
  Filled 2021-03-03 (×2): qty 1

## 2021-03-03 MED ORDER — METOPROLOL TARTRATE 25 MG PO TABS
25.0000 mg | ORAL_TABLET | Freq: Two times a day (BID) | ORAL | Status: DC
  Administered 2021-03-03 – 2021-03-08 (×11): 25 mg via ORAL
  Filled 2021-03-03 (×11): qty 1

## 2021-03-03 MED ORDER — LOSARTAN POTASSIUM 50 MG PO TABS
50.0000 mg | ORAL_TABLET | Freq: Every day | ORAL | Status: DC
  Administered 2021-03-03 – 2021-03-08 (×6): 50 mg via ORAL
  Filled 2021-03-03 (×6): qty 1

## 2021-03-03 MED ORDER — ALUM & MAG HYDROXIDE-SIMETH 200-200-20 MG/5ML PO SUSP
30.0000 mL | Freq: Once | ORAL | Status: AC
  Administered 2021-03-03: 30 mL via ORAL
  Filled 2021-03-03: qty 30

## 2021-03-03 MED ORDER — INSULIN GLARGINE-YFGN 100 UNIT/ML ~~LOC~~ SOLN
12.0000 [IU] | Freq: Every day | SUBCUTANEOUS | Status: DC
  Administered 2021-03-03 – 2021-03-06 (×4): 12 [IU] via SUBCUTANEOUS
  Filled 2021-03-03 (×5): qty 0.12

## 2021-03-03 MED ORDER — PANTOPRAZOLE SODIUM 40 MG PO TBEC
40.0000 mg | DELAYED_RELEASE_TABLET | Freq: Two times a day (BID) | ORAL | Status: DC
Start: 2021-03-03 — End: 2021-03-08
  Administered 2021-03-03 – 2021-03-08 (×10): 40 mg via ORAL
  Filled 2021-03-03 (×10): qty 1

## 2021-03-03 MED ORDER — ADULT MULTIVITAMIN W/MINERALS CH
1.0000 | ORAL_TABLET | Freq: Every day | ORAL | Status: DC
  Administered 2021-03-03 – 2021-03-08 (×6): 1 via ORAL
  Filled 2021-03-03 (×7): qty 1

## 2021-03-03 NOTE — Progress Notes (Signed)
RT note. Patient refuse cpap for the night. Patient stated she prefers wearing [place]/oxygen tonight if she needs to over cpap machine. Told patient if she decides to change her mind please let RN know and RT will bring CPAP machine. Patient currently on Rm air sat 94%, no labored breathing noted, RT will continue to monitor.

## 2021-03-03 NOTE — Progress Notes (Addendum)
Daily Rounding Note  03/03/2021, 9:16 AM  LOS: 2 days   SUBJECTIVE:   Chief complaint:   Abdominal pain.  Melena.  IDA.  Gastritis, small esophageal varices.  Still complaining of pain in the right upper quadrant, times gets up into her chest.  Also having some pain in her left neck.  This is occurring at rest.  She has had this in the past but its become persistent prior to admission.  Some nausea.  Has not had a bowel movement.  Concerned by the amount of sugar in the full liquid diet.  OBJECTIVE:         Vital signs in last 24 hours:    Temp:  [98 F (36.7 C)-98.7 F (37.1 C)] 98.3 F (36.8 C) (08/15 0729) Pulse Rate:  [77-108] 108 (08/15 0729) Resp:  [15-20] 17 (08/15 0729) BP: (85-185)/(37-95) 85/64 (08/15 0729) SpO2:  [91 %-96 %] 91 % (08/15 0729) Last BM Date: 02/28/21 Filed Weights   03/01/21 1228  Weight: 70 kg   General: NAD.  Comfortable.  Anxious. Heart: RRR. Chest: Crackles in the bases more prominent on the right. Abdomen: Soft, nondistended.  Tenderness in the upper abdomen without guarding or rebound.  Active bowel sounds. Extremities: No CCE. Neuro/Psych: Moderately anxious, pleasant, cooperative.  Fully oriented x3.  Moves all 4 limbs without gross weakness.  No tremors.  Intake/Output from previous day: 08/14 0701 - 08/15 0700 In: 1560.7 [P.O.:480; I.V.:657.9; IV Piggyback:422.8] Out: 2750 [Urine:2750]  Intake/Output this shift: No intake/output data recorded.  Lab Results: Recent Labs    03/01/21 1123 03/02/21 0038 03/03/21 0029  WBC 14.2* 17.5* 10.8*  HGB 7.4* 9.6* 8.6*  HCT 25.5* 31.2* 28.0*  PLT 319 305 250   BMET Recent Labs    03/01/21 1123 03/02/21 0038 03/03/21 0029  NA 132* 135 134*  K 4.7 3.8 4.2  CL 100 101 100  CO2 '26 26 26  '$ GLUCOSE 311* 172* 193*  BUN 12 10 5*  CREATININE 0.81 0.67 0.68  CALCIUM 9.7 9.7 8.9   LFT Recent Labs    03/01/21 1123 03/03/21 0029   PROT 6.5 6.0*  ALBUMIN 3.4* 3.1*  AST 22 41  ALT 17 15  ALKPHOS 81 64  BILITOT 0.3 0.6   PT/INR Recent Labs    03/01/21 1931  LABPROT 13.9  INR 1.1   Hepatitis Panel Recent Labs    03/02/21 0038  HEPBSAG NON REACTIVE  HCVAB NON REACTIVE    Studies/Results: DG Chest 2 View  Result Date: 03/01/2021 CLINICAL DATA:  Chest pain, shortness of breath. EXAM: CHEST - 2 VIEW COMPARISON:  Chest x-rays dated 02/26/2021 and 05/02/2014. FINDINGS: Heart size and mediastinal contours are stable. Coarse interstitial markings bilaterally, perihilar and lower lobe predominant. No confluent opacity to suggest a superimposed pneumonia. No pleural effusion or pneumothorax is seen. No acute-appearing osseous abnormality. Aortic atherosclerosis. IMPRESSION: 1. No active cardiopulmonary disease. No evidence of pneumonia or pulmonary edema. 2. Coarse interstitial lung markings bilaterally, perihilar and lower lobe predominant, suggesting chronic bronchitic change and/or chronic interstitial lung disease. 3. Aortic atherosclerosis. Electronically Signed   By: Franki Cabot M.D.   On: 03/01/2021 12:34   CT ABDOMEN PELVIS W CONTRAST  Result Date: 03/01/2021 CLINICAL DATA:  Provided history of: Abdominal pain, acute (Ped 0-18y) EXAM: CT ABDOMEN AND PELVIS WITH CONTRAST TECHNIQUE: Multidetector CT imaging of the abdomen and pelvis was performed using the standard protocol following bolus administration of intravenous contrast.  CONTRAST:  80m OMNIPAQUE IOHEXOL 350 MG/ML SOLN COMPARISON:  CT 04/19/2019 FINDINGS: Lower chest: Linear scarring or atelectasis with slight improvement from prior exam. Slight left pleural thickening without significant effusion. No acute airspace disease. Wall thickening of the distal esophagus is again seen. Paraesophageal varices. Hepatobiliary: Hepatic cirrhosis with heterogeneous decreased hepatic density a nodular contours. In previous geographic area of decreased density in the right  lobe is less prominent than on prior exam. Hypertrophy of the right lobe with generalized atrophy of the left lobe. Cholecystectomy. Common bile duct measures 12 mm, previously 10 mm. No visualized choledocholithiasis Pancreas: No ductal dilatation or inflammation. Spleen: Normal in size without focal abnormality. Adrenals/Urinary Tract: Normal adrenal glands. Hydronephrosis or perinephric edema. Slight extrarenal pelvis configuration of both kidneys. There is homogeneous renal enhancement. Subcentimeter hypodensity in the lower left kidney is too small to characterize but likely cyst. Unremarkable urinary bladder. No bladder wall thickening. No intravesicular air. Stomach/Bowel: Distal esophageal wall thickening with distal esophageal varices. Stomach is nondistended. There is a periampullary duodenal diverticulum without inflammation. Mild fecalization of small bowel contents without obstruction or inflammation. History of appendectomy. Distal descending and sigmoid colonic diverticulosis. There is colonic wall thickening of the sigmoid colon that appears similar to prior exam. Faint pericolonic edema in the left lower quadrant adjacent to the proximal sigmoid colon, series 3, image 71, may represent diverticulitis, though discretely inflamed diverticulum is not seen. Vascular/Lymphatic: Aortic atherosclerosis. No aortic aneurysm. Patent portal vein. Circumaortic left renal vein. No abdominopelvic adenopathy. Reproductive: Hysterectomy.  Quiescent appearance of the ovaries. Other: Trace perihepatic fluid/ascites, series 3, image 21. No free air or focal fluid collection. Induration of the lower anterior abdominal wall typical of medication injection sites, and seen on prior exam. Musculoskeletal: There are no acute or suspicious osseous abnormalities. IMPRESSION: 1. Faint pericolonic edema in the left lower quadrant adjacent to the proximal sigmoid colon, may represent diverticulitis, though discretely inflamed  diverticulum is not seen. No perforation or abscess. 2. Hepatic cirrhosis. Paraesophageal varices. Trace perihepatic fluid/ascites. 3. Distal esophageal wall thickening with distal esophageal varices, unchanged from prior exam. 4. Cholecystectomy with mild common bile duct dilatation, likely related to prior cholecystectomy. No visualized choledocholithiasis. Aortic Atherosclerosis (ICD10-I70.0). Electronically Signed   By: MKeith RakeM.D.   On: 03/01/2021 16:56   [place] (ABDOMEN LIMITED)  Result Date: 03/01/2021 CLINICAL DATA:  Cirrhosis, trace ascites seen on same day CT abdomen pelvis. EXAM: LIMITED ABDOMEN ULTRASOUND FOR ASCITES TECHNIQUE: Limited ultrasound survey for ascites was performed in all four abdominal quadrants. COMPARISON:  Same day CT abdomen pelvis. FINDINGS: No ascites is identified. IMPRESSION: No sonographic evidence of ascites. Electronically Signed   By: TZerita BoersM.D.   On: 03/01/2021 19:50     Scheduled Meds:  ALPRAZolam  1 mg Oral QHS   DULoxetine  60 mg Oral BID   feeding supplement  237 mL Oral BID BM   HYDROcodone-acetaminophen  1 tablet Oral BID   insulin aspart  0-15 Units Subcutaneous Q4H   isosorbide mononitrate  15 mg Oral Daily   montelukast  10 mg Oral QHS   pantoprazole  40 mg Oral BID   traZODone  200 mg Oral QHS   Continuous Infusions:  cefTRIAXone (ROCEPHIN)  IV 2 g (03/02/21 1722)   metronidazole 500 mg (03/03/21 0900)   PRN Meds:.albuterol, dicyclomine, hydrALAZINE, metoprolol tartrate  ASSESMENT:   Abdominal pain.  Melena. 03/02/2021 EGD revealed small, nonbleeding esophageal varices.  Gastritis which was biopsied.  Component of her  pain concerning for cardiac disease.  Had upcoming outpatient appointment with cardiology but plan inpatient cardiac evaluation.     Cirrhosis of the liver, new diagnosis.  No significant EtOH history.  No ascites.  LFTs normal.  Hepatitis B surface antigen, hepatitis B core antibody, HCV antibody all  nonreactive.  Pending labs are multiple and include antimitochondrial antibodies.  IgG, IgA, IgM.  Hepatitis B E antigen.  TTG and glia antibodies.  Ceruloplasmin.  Smooth muscle antibody.  ANA.  Alpha 1 antitrypsin.  Sigmoid diverticulitis?.  Day 3 metronidazole, Rocephin     Anemia.  Hgb 7.4 >> 1.6 >> 8.6.  MCV 82.  Date 2 Rocephin, metronidazole.   PLAN   Continue Protonix 40 mg po bid for at least 8 weeks, then drop dose to once daily.  Obtain hepatitis A serologies to determine need for vaccination.  Eventually will need to undergo hepatitis B vaccine and possibly hepatitis A vaccine.  Advance to carb modified diet  Await gastric biopsies and multiple labs listed above.    Azucena Freed  03/03/2021, 9:16 AM Phone [phone]     Attending Physician's Attestation   I have taken an interval history, reviewed the chart and examined the patient.   70 y/o fm admitted with iron deficiency anemia and abdominal pain.  EGD yesterday with fairly significant gastritis without active bleeding, gastric and duodenal bx pending.  Hgb decreased today from 9.6 to 8.6 without overt GI bleeding.  She had a colonoscopy in 2016 with severe diverticulosis, no polyps.  I think the patient's gastritis is a reasonable culprit for her anemia, but if she continues to show evidence of bleeding, then a colonoscopy would be needed next. 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  Esophageal varices noted on EGD were small and not likely to have been a source of bleeding.  No prophylaxis needed.  Plan to repeat EGD in 2 years for surveillance.  Work up for other causes of cirrhosis is still pending.  She has been having increased anginal symptoms which may be related to her anemia, but an expedited cardiology evaluation is warranted based on her history of CAD/stent placement  I agree with the Advanced Practitioner's note, impression, and recommendations with updates and my documentation above.   Dustin Flock,  MD [facility]

## 2021-03-03 NOTE — Progress Notes (Signed)
Inpatient Diabetes Program Recommendations  AACE/ADA: New Consensus Statement on Inpatient Glycemic Control   Target Ranges:  Prepandial:   less than 140 mg/dL      Peak postprandial:   less than 180 mg/dL (1-2 hours)      Critically ill patients:  140 - 180 mg/dL   Results for [name], [name] (MRN [id number]) as of 03/03/2021 12:15  Ref. Range 03/02/2021 07:32 03/02/2021 10:52 03/02/2021 13:00 03/02/2021 16:35 03/02/2021 20:36 03/02/2021 23:48 03/03/2021 03:56 03/03/2021 07:44  Glucose-Capillary Latest Ref Range: 70 - 99 mg/dL 92 132 (H) 158 (H) 277 (H) 352 (H) 217 (H) 127 (H) 251 (H)    Review of Glycemic Control  Diabetes history: DM2 Outpatient Diabetes medications: Tresiba 100-150 units QAM, Novolog 0-30 units BID (1 hour after taking Tresiba in the morning and again 1 hour before bed (if needed)) Current orders for Inpatient glycemic control: Novolog 0-15 units Q4H  Inpatient Diabetes Program Recommendations:    Insulin: Please consider ordering Semglee 28 units Q24H.  NOTE: Spoke with patient over the phone regarding DM medication regimen. Patient reports being followed by PCP for diabetes management and currently taking Tresiba 100-150 units QAM and Novolog 0-30 units BID (1 hour after taking Tresiba in the morning and again 1 hour before bed (if needed)).  Patient reports glucose is usually 200-300's mg/dl and she frequently has hypoglycemia during the night. Patient states she uses FreeStyle [place] and it often alarms during the night due to hypoglycemia and she has to get up and treat hypoglycemia. Patient reports that she has been on the same insulin regimen for a long time and feels like she is guessing at times about how much to take. Discussed that if she is having frequent hypoglycemia, she is likely on too much insulin and given her size (70 kg) she is on a large dose of [place].  Patient states that she would like to get in with a diabetes specialist. Informed patient that Dr. Dorris Fetch is  an Endocrinologist in [facility] and that she could ask her PCP about making a referral to Dr. Dorris Fetch if she is interested in seeing him.  Patient states she would like to get some help with getting DM under better control and she will plan to ask PCP about making a referral to Dr. Dorris Fetch. Encouraged patient to be sure to follow up regarding DM management.  Patient verbalized understanding of information discussed and reports no further questions at this time related to diabetes.  Thanks, Barnie Alderman, RN, MSN, CDE Diabetes Coordinator Inpatient Diabetes Program [phone] (Team Pager from 8am to 5pm)

## 2021-03-03 NOTE — Consult Note (Addendum)
Cardiology Consultation:   Patient ID: SANTA ABDELRAHMAN MRN: [id number]; DOB: 18-Mar-1951  Admit date: 03/01/2021 Date of Consult: 03/03/2021  PCP:  Caryl Bis, MD   [facility] Providers Cardiologist:  Rozann Lesches, MD   {  Patient Profile:   [name] is a 70 y.o. female with a history of CAD s/p DES to LAD in 2014, hypertension, hyperlipidemia, type 2 diabetes mellitus, obstructive sleep apnea on CPAP, GERD with Barrett's esophagus, Schatzki's ring, IBS, fibromyalgia, chronic back pain, and remote smoking history (quit in 1995) who is being seen for the evaluation of chest pain at the request of Dr. Posey Pronto.  History of Present Illness:   Ms. Norwood is a 70 year old female with the above history who is followed by Dr. Domenic Polite. She has a history of CAD s/p DES to LAD in 2014. Last cardiac catheterization in 12/2017 showed widely patent mid LAD stent with no restenosis and mild to moderate nonobstructive disease of the RCA and LCX. Echo at that time showed LVEF of 60-65% wit normal wall motion and grade 1 diastolic dysfunction. Patient was last seen by Dr. Domenic Polite in 07/2020 at which time she was doing well form a cardiac standpoint and denied any accelerating angina symptoms.  Patient presented to the ED on 03/01/2021 with acute anemia with blood stools, chest pain, and shortness of breath. In the ED, vitals stable. EKG showed sinus bradycardia, rate 49 bpm, with incomplete RBBB but no acute ST/T changes. High-sensitivity troponin negative x2. Chest x-ray showed coarse interstitial lung markings bilaterally suggesting chronic bronchitic change and/or chronic interstitial lung disease but no acute findings. WBC 14.2, Hgb 7.4, Plts 319. Na 132, K 4.7, Glucose 311, BUN 12, Cr 0.81. Albumin 3.4, AST 22, ALT 17, Alk Phos 81, total Bili 0.3. Hemoccult negative. Abdominal/pelvic CT showed fain pericolonic edema in the left lower quadrant adjacent to the proximal sigmoid colon which may represent  diverticulitis as well as hepatic cirrhosis with paraesophageal varices and trace perihepatic fluid/ascites and distal esophageal wall thickening with distal esophageal varices unchanged form prior exam. Patient was admitted and GI was consulted.   GI ordered abdominal ultrasound which showed no evidence of ascites. She was started on Octreotide and Rocephin and then underwent EGD on 03/02/2021 which showed a grade I and small (<70m) esophageal varices and gastritis which was biopsied. Cardiology was consulted for continued chest pain.   At the time of this evaluation, patient is resting comfortably eating lunch. She reports significant dyspnea on exertion followed by substernal chest tightness for the last 1-2 moths. She notes she occasionally has some right arm aching with this. She also notes palpitations with this and near syncope.  However, she is clear that all of the symptoms followed the significant dyspnea on exertion. Chest tightness improves after 1-2 doses of sublingual Nitro. She also notes occasional sharp stabbing rest pain at rest but this only last for a couple of seconds at time and then resolves on its own. She sleeps on a incline due to her GERD but no orthopnea. She reports minimal ankle swelling. She has a lot of GI issues. She has IBS and alternates between being constipated and having diarrhea. She also has vomits frequents due to esophageal issues. She reports off and on melena for a while now but  no gross bleeding in urine or stools. No recent fevers or illnesses but she does report night sweats over the last couple of months as well as about a 10 lb  weight loss. She also notes some leg cramping at night that wakes her up.  Of note, she does note some vague left sided chest "nagging" while talking with her which was reproducible on exam. Also has right upper quadrant pain.  Past Medical History:  Diagnosis Date   Anxiety    Arthritis    Asthma    Barrett esophagus    Chronic  bronchitis (HCC)    Chronic diarrhea    Chronic lower back pain    Coronary atherosclerosis of native coronary artery    DES to LAD May 2014   Diverticulosis    Essential hypertension    Fatty liver    Fibromyalgia    GERD (gastroesophageal reflux disease)    H/O hiatal hernia    IBS (irritable bowel syndrome)    Major depression in partial remission ([place])    Migraines    Mixed hyperlipidemia    Myocardial infarction ([place])    2014   OSA on CPAP    Osteoporosis    Pancreatitis 2008   Pneumonia 1990's   Schatzki's ring    Sleep apnea    Type 2 diabetes mellitus ([place])     Past Surgical History:  Procedure Laterality Date   38 HOUR [place] STUDY N/A 02/16/2018   Procedure: 24 HOUR PH STUDY;  Surgeon: Mauri Pole, MD;  Location: [facility];  Service: Endoscopy;  Laterality: N/A;   ABDOMINAL HYSTERECTOMY  ?1987   APPENDECTOMY  ?1987   BUNIONECTOMY Bilateral 1972   CARPAL TUNNEL RELEASE Bilateral ?1990   CATARACT EXTRACTION W/ INTRAOCULAR LENS  IMPLANT, BILATERAL Bilateral ?2005   CESAREAN SECTION  1985   CHOLECYSTECTOMY  1990'S   COLONOSCOPY  10/13   Dr. Britta Mccreedy - tubular adenomas and diverticulosis   DILATION AND CURETTAGE OF UTERUS  1970's   "probably 3" (12/09/2012)   ERCP  2008   ESOPHAGEAL DILATION  ` 2012   ESOPHAGEAL MANOMETRY N/A 10/21/2015   Procedure: ESOPHAGEAL MANOMETRY (EM);  Surgeon: Doran Stabler, MD;  Location: [facility];  Service: Gastroenterology;  Laterality: N/A;   ESOPHAGEAL MANOMETRY N/A 02/16/2018   Procedure: ESOPHAGEAL MANOMETRY (EM);  Surgeon: Mauri Pole, MD;  Location: [facility];  Service: Endoscopy;  Laterality: N/A;   HUMERUS FRACTURE SURGERY Right 07/19/1969   "horst threw me" (12/09/2012)   LEFT HEART CATH AND CORONARY ANGIOGRAPHY N/A 12/29/2017   Procedure: LEFT HEART CATH AND CORONARY ANGIOGRAPHY;  Surgeon: Wellington Hampshire, MD;  Location: [facility];  Service: Cardiovascular;  Laterality: N/A;   NASAL SEPTUM  SURGERY  1970's   PERCUTANEOUS CORONARY STENT INTERVENTION (PCI-S) N/A 12/09/2012   Procedure: PERCUTANEOUS CORONARY STENT INTERVENTION (PCI-S);  Surgeon: Burnell Blanks, MD;  Location: [facility] CATH LAB;  Service: Cardiovascular;  Laterality: N/A;   [place] IMPEDANCE STUDY N/A 02/16/2018   Procedure: [place] IMPEDANCE STUDY;  Surgeon: Mauri Pole, MD;  Location: [facility];  Service: Endoscopy;  Laterality: N/A;   TOE FUSION Right 10/11   First MTP joint   TOE SURGERY Right 1990's   "toe next to big toe:  dr said I had tumor; cut bone & stuff; another OR dr stretched tendons, etc" (12/09/2012)   TUBAL LIGATION  1985     Home Medications:  Prior to Admission medications   Medication Sig Start Date End Date Taking? Authorizing Provider  albuterol (PROVENTIL HFA;VENTOLIN HFA) 108 (90 Base) MCG/ACT inhaler Inhale 2 puffs into the lungs every 4 (four) hours as needed. 12/15/17  Yes  [provider]  ALPRAZolam [name]) 1 MG tablet Take 1 mg by mouth at bedtime. 10/22/15  Yes [provider]  aspirin EC 81 MG tablet Take 81 mg by mouth daily.   Yes [provider]  atorvastatin (LIPITOR) 20 MG tablet Take 20 mg by mouth daily.   Yes [provider]  CARTIA XT 120 MG 24 hr capsule TAKE ONE CAPSULE BY MOUTH EVERY MORNING Patient taking differently: Take 120 mg by mouth daily. 07/25/20  Yes [name], MD  cetirizine (ZYRTEC) 10 MG tablet Take 10 mg by mouth daily as needed for allergies.   Yes [provider]  dicyclomine (BENTYL) 10 MG capsule TAKE ONE CAPSULE BY MOUTH TWICE DAILY 20-30 MINUTES BEFORE BREAKFAST AND DINNER Patient taking differently: Take 10 mg by mouth 3 (three) times daily as needed for spasms. 10/16/19  Yes Levin Erp, PA  DULoxetine (CYMBALTA) 60 MG capsule Take 60 mg by mouth 2 (two) times daily.  08/15/15  Yes [provider]  FEROSUL 325 (65 Fe) MG tablet Take 325 mg by mouth 2 (two) times daily. 02/27/21  Yes  [provider]  HYDROcodone-acetaminophen (NORCO/VICODIN) 5-325 MG tablet Take 1 tablet by mouth 2 (two) times daily. 10/22/15  Yes [provider]  isosorbide mononitrate (IMDUR) 30 MG 24 hr tablet TAKE 1/2 TABLET BY MOUTH ONCE DAILY 10/28/20  Yes [name], MD  losartan-hydrochlorothiazide (HYZAAR) 100-25 MG tablet Take 1 tablet by mouth daily. 04/11/19  Yes [provider]  metoprolol tartrate (LOPRESSOR) 25 MG tablet TAKE ONE TABLET BY MOUTH EVERY MORNING and TAKE ONE TABLET BY MOUTH EVERYDAY AT BEDTIME 07/25/20  Yes [name], MD  montelukast (SINGULAIR) 10 MG tablet Take 10 mg by mouth at bedtime.  08/15/15  Yes [provider]  nitroGLYCERIN (NITROSTAT) 0.4 MG SL tablet Place 0.4 mg under the tongue every 5 (five) minutes x 3 doses as needed (if no relief after 3rd dose, proceed to the ED for an evaluation or call 911).    Yes [provider]  NOVOLOG FLEXPEN 100 UNIT/ML FlexPen Inject 0-30 Units into the skin in the morning and at bedtime. 12/31/20  Yes [provider]  omeprazole (PRILOSEC) 40 MG capsule TAKE ONE CAPSULE BY MOUTH twice A DAY Patient taking differently: Take 40 mg by mouth in the morning and at bedtime. 11/08/20  Yes Danis, Kirke Corin, MD  promethazine (PHENERGAN) 25 MG tablet Take 25 mg by mouth at bedtime. Takes prn and at times does 1/2 tablet instead of whole one   Yes [provider]  traZODone (DESYREL) 50 MG tablet Take 100-150 mg by mouth at bedtime. 4 tabs hs 08/15/15  Yes [provider]  TRESIBA FLEXTOUCH 200 UNIT/ML SOPN Inject 100 Units into the skin 2 (two) times daily as needed (blood sugar level). As directed 10/28/15  Yes [provider]    Inpatient Medications: Scheduled Meds:  alum & mag hydroxide-simeth  30 mL Oral Once   DULoxetine  60 mg Oral BID   feeding supplement  237 mL Oral BID BM   insulin aspart  0-15 Units Subcutaneous TID WC   insulin aspart  0-5  Units Subcutaneous QHS   isosorbide mononitrate  15 mg Oral Once   [START ON 03/04/2021] isosorbide mononitrate  30 mg Oral Daily   montelukast  10 mg Oral QHS   pantoprazole  40 mg Oral BID AC   traZODone  200 mg Oral QHS  Continuous Infusions:  cefTRIAXone (ROCEPHIN)  IV 2 g (03/02/21 1722)   metronidazole 500 mg (03/03/21 0900)   PRN Meds: albuterol, ALPRAZolam, dicyclomine, hydrALAZINE, HYDROcodone-acetaminophen, metoprolol tartrate, morphine injection  Allergies:    Allergies  Allergen Reactions   Erythromycin Base Diarrhea   Azithromycin Other (See Comments)    Upset stomach    Pollen Extract Cough    Runny nose   Remeron [Mirtazapine] Other (See Comments)    Makes her too sleepy   Zolpidem Tartrate Other (See Comments)    amnesia   Other Rash and Other (See Comments)    Grass, dust, green beans, corn, cabbage Reaction: upset stomach   Sulfonamide Derivatives Rash    Mouth sores   Tree Extract Rash    Social History:   Social History   Socioeconomic History   Marital status: Married    Spouse name: [name]   Number of children: 2   Years of education: Not on file   Highest education level: Not on file  Occupational History   Occupation: Retired  [employer]  Tobacco Use   Smoking status: Former    Packs/day: 0.80    Years: 15.00    Pack years: 12.00    Types: Cigarettes    Start date: 07/20/1976    Quit date: 07/20/1993    Years since quitting: 27.6   Smokeless tobacco: Never  Vaping Use   Vaping Use: Never used  Substance and Sexual Activity   Alcohol use: No    Alcohol/week: 0.0 standard drinks   Drug use: No   Sexual activity: Yes  Other Topics Concern   Not on file  Social History Narrative   Not on file   Social Determinants of Health   Financial Resource Strain: Not on file  Food Insecurity: Not on file  Transportation Needs: Not on file  Physical Activity: Not on file  Stress: Not on file  Social Connections: Not on file  Intimate Partner  Violence: Not on file    Family History:   Family History  Problem Relation Age of Onset   Stroke Mother    Cirrhosis Father    Hypertension Father    Esophageal cancer Sister    Colon polyps Sister        pre-cancerous, part of colon removed   Prostate cancer Brother    Colon cancer Neg Hx    Rectal cancer Neg Hx    Stomach cancer Neg Hx      ROS:  Please see the history of present illness.  Review of Systems  Constitutional:  Positive for diaphoresis (night sweats) and weight loss. Negative for fever.  HENT:  Negative for congestion.   Respiratory:  Positive for shortness of breath. Negative for cough and hemoptysis.   Cardiovascular:  Positive for chest pain, palpitations and leg swelling. Negative for orthopnea.  Gastrointestinal:  Positive for abdominal pain, constipation, diarrhea, melena, nausea and vomiting. Negative for blood in stool.  Genitourinary:  Negative for hematuria.  Musculoskeletal:  Negative for myalgias.  Neurological:  Positive for dizziness. Negative for loss of consciousness.  Endo/Heme/Allergies:  Does not bruise/bleed easily.  Psychiatric/Behavioral:  Negative for substance abuse.     Physical Exam/Data:   Vitals:   03/02/21 2300 03/03/21 0300 03/03/21 0729 03/03/21 1119  BP: (!) 165/50 (!) 134/45 (!) 85/64 (!) 165/70  Pulse: 93 86 (!) 108 96  Resp: '17 16 17 10  ' Temp: 98.2 F (36.8 C) 98 F (36.7 C) 98.3 F (36.8 C) 98.2 F (36.8  C)  TempSrc: Oral Oral  Oral  SpO2: 91% 92% 91% 93%  Weight:      Height:        Intake/Output Summary (Last 24 hours) at 03/03/2021 1237 Last data filed at 03/03/2021 0357 Gross per 24 hour  Intake 902.78 ml  Output 1950 ml  Net -1047.22 ml   Last 3 Weights 03/01/2021 08/01/2020 12/07/2019  Weight (lbs) 154 lb 5.2 oz 160 lb 165 lb  Weight (kg) 70 kg 72.576 kg 74.844 kg     Body mass index is 28.23 kg/m.  General: 70 y.o. female resting comfortably in no acute distress.  HEENT: Normocephalic and  atraumatic. Sclera clear.  Neck: Supple. No JVD. Heart: Borderline tachycardic with regular rhythm. Soft murmur. Radial and distal pedal pulses 2+ and equal bilaterally. Lungs: No increased work of breathing. Faint course crackles in bilateral bases.  Abdomen: Soft and non-distended but tender to palpation of upper abdomen. MSK: Normal strength and tone for age. Extremities: No lower extremity edema.    Skin: Warm and dry. Neuro: Alert and oriented x3. No focal deficits. Psych: Normal affect. Responds appropriately.   EKG:  The EKG was personally reviewed and demonstrates: Sinus bradycardia, rate 49 bpm, with incomplete RBBB but no acute ST/T changes Telemetry:  Telemetry was personally reviewed and demonstrates:  Sinus rhythm with rates in the 80s to low 100s.  Relevant CV Studies:  Echocardiogram 12/28/2017: Study Conclusions: - Left ventricle: The cavity size was normal. Wall thickness was    increased in a pattern of mild LVH. Systolic function was normal.    The estimated ejection fraction was in the range of 60% to 65%.    Wall motion was normal; there were no regional wall motion    abnormalities. Doppler parameters are consistent with abnormal    left ventricular relaxation (grade 1 diastolic dysfunction).    Doppler parameters are consistent with high ventricular filling    pressure.  - Aortic valve: Mildly to moderately calcified annulus. Trileaflet.  _______________  Left Cardiac Catheterization 12/29/2017: The left ventricular systolic function is normal. LV end diastolic pressure is mildly elevated. The left ventricular ejection fraction is 55-65% by visual estimate. Mid RCA lesion is 40% stenosed. Prox Cx lesion is 30% stenosed. Ost 1st Diag lesion is 40% stenosed. Previously placed Mid LAD stent (unknown type) is widely patent.   1.  Widely patent mid LAD stent with no restenosis.  Mild to moderate nonobstructive disease affecting the RCA and left circumflex. 2.   Normal LV systolic function and mildly elevated left ventricular end-diastolic pressure.   Recommendations: I suspect noncardiac chest pain.  Continue medical therapy.  Diagnostic Dominance: Right   Laboratory Data:  High Sensitivity Troponin:   Recent Labs  Lab 03/01/21 1123 03/01/21 1355  TROPONINIHS 14 14     Chemistry Recent Labs  Lab 03/01/21 1123 03/02/21 0038 03/03/21 0029  NA 132* 135 134*  K 4.7 3.8 4.2  CL 100 101 100  CO2 '26 26 26  ' GLUCOSE 311* 172* 193*  BUN 12 10 5*  CREATININE 0.81 0.67 0.68  CALCIUM 9.7 9.7 8.9  GFRNONAA >60 >60 >60  ANIONGAP '6 8 8    ' Recent Labs  Lab 03/01/21 1123 03/03/21 0029  PROT 6.5 6.0*  ALBUMIN 3.4* 3.1*  AST 22 41  ALT 17 15  ALKPHOS 81 64  BILITOT 0.3 0.6   Hematology Recent Labs  Lab 03/01/21 1123 03/01/21 1931 03/02/21 0038 03/03/21 0029  WBC 14.2*  --  17.5* 10.8*  RBC 3.10* 3.74* 3.80* 3.40*  HGB 7.4*  --  9.6* 8.6*  HCT 25.5*  --  31.2* 28.0*  MCV 82.3  --  82.1 82.4  MCH 23.9*  --  25.3* 25.3*  MCHC 29.0*  --  30.8 30.7  RDW 16.5*  --  17.2* 18.2*  PLT 319  --  305 250   BNPNo results for input(s): BNP, PROBNP in the last 168 hours.  DDimer No results for input(s): DDIMER in the last 168 hours.   Radiology/Studies:  DG Chest 2 View  Result Date: 03/01/2021 CLINICAL DATA:  Chest pain, shortness of breath. EXAM: CHEST - 2 VIEW COMPARISON:  Chest x-rays dated 02/26/2021 and 05/02/2014. FINDINGS: Heart size and mediastinal contours are stable. Coarse interstitial markings bilaterally, perihilar and lower lobe predominant. No confluent opacity to suggest a superimposed pneumonia. No pleural effusion or pneumothorax is seen. No acute-appearing osseous abnormality. Aortic atherosclerosis. IMPRESSION: 1. No active cardiopulmonary disease. No evidence of pneumonia or pulmonary edema. 2. Coarse interstitial lung markings bilaterally, perihilar and lower lobe predominant, suggesting chronic bronchitic change  and/or chronic interstitial lung disease. 3. Aortic atherosclerosis. Electronically Signed   By: Franki Cabot M.D.   On: 03/01/2021 12:34   CT ABDOMEN PELVIS W CONTRAST  Result Date: 03/01/2021 CLINICAL DATA:  Provided history of: Abdominal pain, acute (Ped 0-18y) EXAM: CT ABDOMEN AND PELVIS WITH CONTRAST TECHNIQUE: Multidetector CT imaging of the abdomen and pelvis was performed using the standard protocol following bolus administration of intravenous contrast. CONTRAST:  55m OMNIPAQUE IOHEXOL 350 MG/ML SOLN COMPARISON:  CT 04/19/2019 FINDINGS: Lower chest: Linear scarring or atelectasis with slight improvement from prior exam. Slight left pleural thickening without significant effusion. No acute airspace disease. Wall thickening of the distal esophagus is again seen. Paraesophageal varices. Hepatobiliary: Hepatic cirrhosis with heterogeneous decreased hepatic density a nodular contours. In previous geographic area of decreased density in the right lobe is less prominent than on prior exam. Hypertrophy of the right lobe with generalized atrophy of the left lobe. Cholecystectomy. Common bile duct measures 12 mm, previously 10 mm. No visualized choledocholithiasis Pancreas: No ductal dilatation or inflammation. Spleen: Normal in size without focal abnormality. Adrenals/Urinary Tract: Normal adrenal glands. Hydronephrosis or perinephric edema. Slight extrarenal pelvis configuration of both kidneys. There is homogeneous renal enhancement. Subcentimeter hypodensity in the lower left kidney is too small to characterize but likely cyst. Unremarkable urinary bladder. No bladder wall thickening. No intravesicular air. Stomach/Bowel: Distal esophageal wall thickening with distal esophageal varices. Stomach is nondistended. There is a periampullary duodenal diverticulum without inflammation. Mild fecalization of small bowel contents without obstruction or inflammation. History of appendectomy. Distal descending and  sigmoid colonic diverticulosis. There is colonic wall thickening of the sigmoid colon that appears similar to prior exam. Faint pericolonic edema in the left lower quadrant adjacent to the proximal sigmoid colon, series 3, image 71, may represent diverticulitis, though discretely inflamed diverticulum is not seen. Vascular/Lymphatic: Aortic atherosclerosis. No aortic aneurysm. Patent portal vein. Circumaortic left renal vein. No abdominopelvic adenopathy. Reproductive: Hysterectomy.  Quiescent appearance of the ovaries. Other: Trace perihepatic fluid/ascites, series 3, image 21. No free air or focal fluid collection. Induration of the lower anterior abdominal wall typical of medication injection sites, and seen on prior exam. Musculoskeletal: There are no acute or suspicious osseous abnormalities. IMPRESSION: 1. Faint pericolonic edema in the left lower quadrant adjacent to the proximal sigmoid colon, may represent diverticulitis, though discretely inflamed diverticulum is not seen. No perforation or abscess. 2.  Hepatic cirrhosis. Paraesophageal varices. Trace perihepatic fluid/ascites. 3. Distal esophageal wall thickening with distal esophageal varices, unchanged from prior exam. 4. Cholecystectomy with mild common bile duct dilatation, likely related to prior cholecystectomy. No visualized choledocholithiasis. Aortic Atherosclerosis (ICD10-I70.0). Electronically Signed   By: Keith Rake M.D.   On: 03/01/2021 16:56   [place] ASCITES (ABDOMEN LIMITED)  Result Date: 03/01/2021 CLINICAL DATA:  Cirrhosis, trace ascites seen on same day CT abdomen pelvis. EXAM: LIMITED ABDOMEN ULTRASOUND FOR ASCITES TECHNIQUE: Limited ultrasound survey for ascites was performed in all four abdominal quadrants. COMPARISON:  Same day CT abdomen pelvis. FINDINGS: No ascites is identified. IMPRESSION: No sonographic evidence of ascites. Electronically Signed   By: Zerita Boers M.D.   On: 03/01/2021 19:50     Assessment and Plan:    Dyspnea on Exertion Chest Pain History of CAD - S/p DES to LAD in 2014. Last can in 2019 showed patent stent with mild to moderate nonobstructive disease elsewhere. Presented with dyspnea on exertion and chest pain in the setting of acute anemia. - EKG showed no acute ischemic changes.  - High-sensitivity troponin negative x2.  - Will order Echo. - Continue statin.  - Discussed with MD. Given acute anemia, esophageal varices, and negative enzymes, no plans for ischemic work-up at this time. If patient continues to have symptoms with improvement of hemoglobin can consider outpatient ischemic work-up. Would likely start with Lexiscan Myoview.  Abdominal Pain Melena Hepatitis Cirrhosis - New Diagnosis - EGD on 03/02/2021 showed small nonbleeding esophageal varices and gastritis which was biopsied. - CT showed hepatic cirrhosis. Abdominal ultrasound showed no evidence of ascites. - GI has ordered thorough work-up. Management per GI.  Acute Anemia - Hemoglobin 7.4 on admission. S/p 2 unit of PRBCs on 8/13. Stable at 8.6 today. Baseline around 14 in 2020. - GI work-up as above.  Hypertension - BP as high as the 180s. One soft BP reading of 85/64 this morning but I don't think this was accurate as this was an isolated reading. - Imdur increased to 66m daily as above. - Home Cardizem and Lopressor were stopped on discharge due to bradycardia. Patient now borderline tachycardic. Will add back Lopressor 252mtwice daily first and see how she does with this.  - Home Losartan-HCTZ held on admission. Will restart this as well. Will restart Losartan at 5034maily and HCTZ at 17m58mily.  Hyperlipidemia - LDL 81 in 2019. Goal <70 given CAD. - Continue home Lipitor 20mg6mly for now.  - Will recheck fasting lipid panel in the morning.  Type 2 Diabetes Mellitus  - Management per primary team.   Risk Assessment/Risk Scores:  [id number]}AR Score (for undifferentiated chest pain):  HEAR  Score: 6{   For questions or updates, please contact [facility] consult [email or web address] for contact info under    Signed, CalliDarreld McleanC  03/03/2021 12:37 PM  Personally seen and examined. Agree with above.  70 ye55 old with known coronary disease prior LAD stent in 2014 with catheterization in 2019 showing widely patent stent with no restenosis, normal ejection fraction who was diagnosed with hepatic cirrhosis, anemia with hemoglobin in the 7 range, recent shortness of breath with activity as well as chest discomfort.  Was found to have esophageal varices and gastritis, biopsied.  EKG unremarkable no ischemic changes.  Personally reviewed.  GI evaluation reviewed.  New diagnosis of hepatic cirrhosis with small nonbleeding esophageal varices.  Status post 2 units of blood.  8.6 now.  Baseline  hemoglobin is 14.  Blood pressure elevated now after holding losartan/hydrochlorothiazide and her home medication as well as metoprolol and diltiazem.  -I think is reasonable to resume her losartan hydrochlorothiazide as well as metoprolol at this point.  Tomorrow if she handles the medications well, she can restart her home diltiazem.  Continue with goal-directed therapy as it relates to her diabetes hyperlipidemia coronary disease management.  Obviously aspirin/antiplatelet is on hold given her recent melena GI bleed.  After her anemia is corrected, she will be seeing Dr. Domenic Polite as an outpatient has appointment already set.  If symptoms continue that appear cardiac in origin, stress testing could ensue.  Right now I would not suggest any stress testing or invasive testing given her recent GI bleed.  Troponins were negative.  Reassuring.  Please let us know if we can be of further assistance.  We will go ahead and sign off.  Candee Furbish, MD

## 2021-03-03 NOTE — Progress Notes (Signed)
Initial Nutrition Assessment  DOCUMENTATION CODES:   Not applicable  INTERVENTION:  -d/c Ensure Enlive/Plus -Ensure Max po BID, each supplement provides 150 kcal and 30 grams of protein -MVI with minerals daily  NUTRITION DIAGNOSIS:   Inadequate oral intake related to acute illness (gastritis) as evidenced by per patient/family report.  GOAL:   Patient will meet greater than or equal to 90% of their needs  MONITOR:   PO intake, Supplement acceptance, Diet advancement, Weight trends, Labs, I & O's  REASON FOR ASSESSMENT:   Malnutrition Screening Tool    ASSESSMENT:   Pt with a PMH significant for diverticulosis and colon polyps, CAD w/ stenting, HTN, IDDM, Barrett esophagus, IBS, OSA, presented with persistent abdominal pain and diarrhea, dark-colored stool, and increasing shortness of breath. Admitted with symptomatic anemia.  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  Pt reports diarrhea began ~7-8 weeks ago, 3-4 occurrences per day in addition to abdominal cramping. Pt denies N/V. Diet advanced to soft foods today. Pt with orders for Ensure BID but, per RN, has refused both times it was offered. Will trial alternative supplement options.   No significant weight changes noted based on available weight readings.   No PO intake documented.   Medications: SSI, semglee,protonix, IV abx Labs: Na 134 (L) CBGs [phone]   UOP: 2790m x24 hours I/O: -17143msince admit  NUTRITION - FOCUSED PHYSICAL EXAM: Unable to perform at this time. Will attempt at follow-up.   Diet Order:   Diet Order             DIET SOFT Room service appropriate? Yes; Fluid consistency: Thin  Diet effective now                   EDUCATION NEEDS:   No education needs have been identified at this time  Skin:  Skin Assessment: Reviewed RN Assessment  Last BM:   8/12  Height:   Ht Readings from Last 1 Encounters:  03/01/21 '5\' 2"'$  (1.575 m)    Weight:   Wt Readings from Last 5 Encounters:  03/01/21 70 kg  08/01/20 72.6 kg  12/07/19 74.8 kg  06/21/19 72.8 kg  05/17/19 70.3 kg   BMI:  Body mass index is 28.23 kg/m.  Estimated Nutritional Needs:   Kcal:  [id number]:  85-100 grams  Fluid:  >1.75L/d    AmLarkin InaMS, RD, LDN (she/her/hers) RD pager number and weekend/on-call pager number located in [facility]

## 2021-03-03 NOTE — Discharge Instructions (Signed)
Ask Dr. Quillian Quince about referring you to an Endocrinologist (diabetes specialist). Dr. Dorris Fetch is the Endocrinologist in [place]. The office address is [address] [place], [place] [address] and  Phone number is [phone].

## 2021-03-03 NOTE — Progress Notes (Signed)
[facility] Progress Note  Patient: [name]    [id number]  DOA: 03/01/2021     Date of Service: the patient was seen and examined on 03/03/2021  Brief hospital course: [name] is a 69 y.o. female with medical history significant of diverticulosis and colon polyps, CAD with stenting, HTN, IDDM,Barrett esophagus, IBS, OSA on CPAP presented with persistent abdominal pain and diarrhea, dark-colored stool, and increasing shortness of breath. Although symptomatic anemia, diverticulitis. GI consulted underwent EGD. Currently plan is continue further work-up per GI as well as consult cardiology for further assistance.  Subjective: Reports chest pain which is pressure and tightness in nature. Started this morning.  No nausea or vomiting.  Reports shortness of breath.  Pain also started after eating. Also reports abdominal pain which is still worsening after eating.  Passing gas.  No BM.  Assessment and Plan: 1.  Symptomatic anemia Iron deficiency anemia Melena Abdominal pain. SP 2 episodes of transfusion. Further work-up for anemia. EGD performed today shows evidence of gastritis with bleeding. Continue PPI. Varices without any acute bleeding. Will monitor further recommendation from GI for work-up  2.  New diagnosis of cirrhosis as well as esophageal varices. No significant ascites. Was started on octreotide infusion. Management per GI. No significant bleeding seen on EGD.  Work-up initiated per GI.  3.  Diverticulitis Started on IV ceftriaxone and Flagyl. Currently on ceftriaxone per GI. Monitor for now. GI advance the patient to carb modified diet but due to patient's persistent pain with eating I am going to lower it to soft diet for now. If the patient continues to have pain I would actually recommend to just be on clear liquid diet for now. Continue with ceftriaxone and Flagyl for now.  4.  CAD Angina Not a good candidate for any anticoagulation right  now. Likely her angina is associated with her anemia. [place] cardiology consultation. EKG unremarkable.  Troponin x2 unremarkable.  Chest x-ray unremarkable despite patient's ongoing chest pain which feels like angina. Cardiology recommends outpatient follow-up once her hemoglobin is stabilized. Currently on Imdur.  5.  Mood disorder and anxiety Continue Xanax and Cymbalta.  Continue trazodone nightly.  Scheduled Meds:  DULoxetine  60 mg Oral BID   hydrochlorothiazide  25 mg Oral Daily   insulin aspart  0-15 Units Subcutaneous TID WC   insulin aspart  0-5 Units Subcutaneous QHS   insulin glargine-yfgn  12 Units Subcutaneous Daily   [START ON 03/04/2021] isosorbide mononitrate  30 mg Oral Daily   losartan  50 mg Oral Daily   metoprolol tartrate  25 mg Oral BID   montelukast  10 mg Oral QHS   multivitamin with minerals  1 tablet Oral Daily   pantoprazole  40 mg Oral BID AC   Ensure Max Protein  11 oz Oral BID   traZODone  200 mg Oral QHS   Continuous Infusions:  cefTRIAXone (ROCEPHIN)  IV 2 g (03/03/21 1602)   metronidazole 500 mg (03/03/21 1658)   PRN Meds: albuterol, ALPRAZolam, dicyclomine, hydrALAZINE, HYDROcodone-acetaminophen, metoprolol tartrate, morphine injection  Body mass index is 28.23 kg/m.  Nutrition Problem: Inadequate oral intake Etiology: acute illness (gastritis)     DVT Prophylaxis:   SCDs Start: 03/01/21 1634   Advance goals of care discussion: Pt is Full code.  Family Communication: no family was present at bedside, at the time of interview.   Data Reviewed: I have personally reviewed and interpreted daily labs, tele strips, imaging. Sodium 134.  Serum creatinine 0.68.  LFTs stable.  Troponins x2 negative.  Iron level 26.  B12 826. Hemoglobin baseline 12-14, on admission 7.4-9.6-8.6.  Reticulocyte count normal.  Physical Exam:  General: Appear in moderate distress, no Rash; Oral Mucosa Clear, moist. no Abnormal Neck Mass Or lumps, Conjunctiva  normal  Cardiovascular: S1 and S2 Present, no Murmur, Respiratory: good respiratory effort, Bilateral Air entry present and CTA, no Crackles, no wheezes Abdomen: Bowel Sound present, Soft and no tenderness Extremities: no Pedal edema Neurology: alert and oriented to time, place, and person affect appropriate. no new focal deficit Gait not checked due to patient safety concerns    Vitals:   03/03/21 0300 03/03/21 0729 03/03/21 1119 03/03/21 1557  BP: (!) 134/45 (!) 85/64 (!) 165/70 (!) 167/59  Pulse: 86 (!) 108 96 95  Resp: '16 17 10 19  '$ Temp: 98 F (36.7 C) 98.3 F (36.8 C) 98.2 F (36.8 C) 98.6 F (37 C)  TempSrc: Oral  Oral Oral  SpO2: 92% 91% 93% 92%  Weight:      Height:        Disposition:  Status is: Inpatient  Remains inpatient appropriate because:IV treatments appropriate due to intensity of illness or inability to take PO and Inpatient level of care appropriate due to severity of illness  Dispo: The patient is from: Home              Anticipated d/c is to: Home              Patient currently is not medically stable to d/c.   Difficult to place patient No        Time spent: 35 minutes. I reviewed all nursing notes, pharmacy notes, vitals, pertinent old records. I have discussed plan of care as described above with RN.  Author: Berle Mull, MD [facility] 03/03/2021 6:04 PM  To reach On-call, see care teams to locate the attending and reach out via [email or web address]. Between 7PM-7AM, please contact night-coverage If you still have difficulty reaching the attending provider, please page the [facility] (Director on Call) for [facility] on amion for assistance.

## 2021-03-04 ENCOUNTER — Inpatient Hospital Stay (HOSPITAL_COMMUNITY): Payer: Medicare HMO

## 2021-03-04 ENCOUNTER — Encounter (HOSPITAL_COMMUNITY): Payer: Self-pay | Admitting: Gastroenterology

## 2021-03-04 DIAGNOSIS — K7460 Unspecified cirrhosis of liver: Secondary | ICD-10-CM

## 2021-03-04 DIAGNOSIS — D500 Iron deficiency anemia secondary to blood loss (chronic): Secondary | ICD-10-CM

## 2021-03-04 DIAGNOSIS — R079 Chest pain, unspecified: Secondary | ICD-10-CM | POA: Diagnosis not present

## 2021-03-04 DIAGNOSIS — K2990 Gastroduodenitis, unspecified, without bleeding: Secondary | ICD-10-CM

## 2021-03-04 DIAGNOSIS — K5732 Diverticulitis of large intestine without perforation or abscess without bleeding: Secondary | ICD-10-CM

## 2021-03-04 DIAGNOSIS — K2970 Gastritis, unspecified, without bleeding: Secondary | ICD-10-CM

## 2021-03-04 LAB — ECHOCARDIOGRAM COMPLETE
AR max vel: 1.72 cm2
AV Area VTI: 1.81 cm2
AV Area mean vel: 1.83 cm2
AV Mean grad: 9 mmHg
AV Peak grad: 16 mmHg
Ao pk vel: 2 m/s
Area-P 1/2: 2.64 cm2
Height: 62 in
MV VTI: 2.47 cm2
S' Lateral: 2.5 cm
Weight: 2469.15 oz

## 2021-03-04 LAB — COMPREHENSIVE METABOLIC PANEL
ALT: 15 U/L (ref 0–44)
AST: 22 U/L (ref 15–41)
Albumin: 3.2 g/dL — ABNORMAL LOW (ref 3.5–5.0)
Alkaline Phosphatase: 67 U/L (ref 38–126)
Anion gap: 6 (ref 5–15)
BUN: 5 mg/dL — ABNORMAL LOW (ref 8–23)
CO2: 27 mmol/L (ref 22–32)
Calcium: 9 mg/dL (ref 8.9–10.3)
Chloride: 99 mmol/L (ref 98–111)
Creatinine, Ser: 0.6 mg/dL (ref 0.44–1.00)
GFR, Estimated: >60 mL/min (ref >60)
Glucose, Bld: 196 mg/dL — ABNORMAL HIGH (ref 70–99)
Potassium: 3.8 mmol/L (ref 3.5–5.1)
Sodium: 132 mmol/L — ABNORMAL LOW (ref 135–145)
Total Bilirubin: 0.5 mg/dL (ref 0.3–1.2)
Total Protein: 6.3 g/dL — ABNORMAL LOW (ref 6.5–8.1)

## 2021-03-04 LAB — CBC WITH DIFFERENTIAL/PLATELET
Abs Immature Granulocytes: 0.04 10*3/uL (ref 0.00–0.07)
Basophils Absolute: 0.1 10*3/uL (ref 0.0–0.1)
Basophils Relative: 1 %
Eosinophils Absolute: 0.5 10*3/uL (ref 0.0–0.5)
Eosinophils Relative: 4 %
HCT: 30.4 % — ABNORMAL LOW (ref 36.0–46.0)
Hemoglobin: 8.9 g/dL — ABNORMAL LOW (ref 12.0–15.0)
Immature Granulocytes: 0 %
Lymphocytes Relative: 42 %
Lymphs Abs: 5.9 10*3/uL — ABNORMAL HIGH (ref 0.7–4.0)
MCH: 24.6 pg — ABNORMAL LOW (ref 26.0–34.0)
MCHC: 29.3 g/dL — ABNORMAL LOW (ref 30.0–36.0)
MCV: 84 fL (ref 80.0–100.0)
Monocytes Absolute: 1.3 10*3/uL — ABNORMAL HIGH (ref 0.1–1.0)
Monocytes Relative: 9 %
Neutro Abs: 6.3 10*3/uL (ref 1.7–7.7)
Neutrophils Relative %: 44 %
Platelets: 278 10*3/uL (ref 150–400)
RBC: 3.62 MIL/uL — ABNORMAL LOW (ref 3.87–5.11)
RDW: 18.9 % — ABNORMAL HIGH (ref 11.5–15.5)
WBC: 14.1 10*3/uL — ABNORMAL HIGH (ref 4.0–10.5)
nRBC: 0 % (ref 0.0–0.2)

## 2021-03-04 LAB — GLUCOSE, CAPILLARY
Glucose-Capillary: 203 mg/dL — ABNORMAL HIGH (ref 70–99)
Glucose-Capillary: 203 mg/dL — ABNORMAL HIGH (ref 70–99)
Glucose-Capillary: 169 mg/dL — ABNORMAL HIGH (ref 70–99)
Glucose-Capillary: 247 mg/dL — ABNORMAL HIGH (ref 70–99)
Glucose-Capillary: 223 mg/dL — ABNORMAL HIGH (ref 70–99)

## 2021-03-04 LAB — GLIA (IGA/G) + TTG IGA
Antigliadin Abs, IgA: 4 units (ref 0–19)
Gliadin IgG: 1 units (ref 0–19)
Tissue Transglutaminase Ab, IgA: <2 U/mL (ref 0–3)

## 2021-03-04 LAB — PATHOLOGIST SMEAR REVIEW

## 2021-03-04 LAB — SURGICAL PATHOLOGY

## 2021-03-04 LAB — HEPATITIS A ANTIBODY, TOTAL: hep A Total Ab: REACTIVE — AB

## 2021-03-04 MED ORDER — POLYETHYLENE GLYCOL 3350 17 G PO PACK
17.0000 g | PACK | Freq: Every day | ORAL | Status: DC
  Filled 2021-03-04 (×3): qty 1

## 2021-03-04 MED ORDER — HYDROCHLOROTHIAZIDE 12.5 MG PO CAPS
12.5000 mg | ORAL_CAPSULE | Freq: Every day | ORAL | Status: DC
  Administered 2021-03-04: 12.5 mg via ORAL
  Filled 2021-03-04: qty 1

## 2021-03-04 MED ORDER — ALUM & MAG HYDROXIDE-SIMETH 200-200-20 MG/5ML PO SUSP
30.0000 mL | Freq: Four times a day (QID) | ORAL | Status: DC
  Administered 2021-03-04 – 2021-03-08 (×17): 30 mL via ORAL
  Filled 2021-03-04 (×19): qty 30

## 2021-03-04 MED ORDER — SODIUM CHLORIDE 0.9 % IV SOLN
3.0000 g | Freq: Four times a day (QID) | INTRAVENOUS | Status: DC
  Administered 2021-03-04 – 2021-03-08 (×16): 3 g via INTRAVENOUS
  Filled 2021-03-04 (×17): qty 8

## 2021-03-04 MED ORDER — BISACODYL 10 MG RE SUPP: 10.0000 mg | Freq: Once | RECTAL | Status: DC

## 2021-03-04 MED ORDER — DILTIAZEM HCL ER COATED BEADS 120 MG PO CP24
120.0000 mg | ORAL_CAPSULE | Freq: Every morning | ORAL | Status: DC
  Administered 2021-03-05 – 2021-03-08 (×4): 120 mg via ORAL
  Filled 2021-03-04 (×5): qty 1

## 2021-03-04 NOTE — Consult Note (Signed)
[place] ROUNDING NOTE   70 y/o fm with CAD, IDDM, IBS and OSA admitted Aug 13 with abdominal pain, dark stools and iron deficiency anemia.  Also incidentally noted to have cirrhotic appearing liver with evidence of portal hypertension.  Subjective: No major changes in last 24 hours.  Still having vague abdominal pain, malaise and nausea.  Tolerating a liquid diet.  No fevers/chills.  She has not had a bowel movement in several days. No passage of blood/melena.  TTE this morning with no major abnormalities.   Objective: Vital signs in last 24 hours: Temp:  [98.3 F (36.8 C)-98.6 F (37 C)] 98.5 F (36.9 C) (08/16 1109) Pulse Rate:  [63-95] 77 (08/16 1004) Resp:  [14-20] 20 (08/16 1109) BP: (125-167)/(46-61) 160/61 (08/16 1109) SpO2:  [91 %-95 %] 91 % (08/16 0726) Last BM Date: 02/28/21 General: NAD Caucasian, appears comfortable this morning Lungs:  CTA b/l, no w/r/r Heart:  RRR, no m/r/g Abdomen:  Soft, diffuse tenderness to palpation without guarding or rigidity, ND, +BS Ext:  No c/c/e    Intake/Output from previous day: 08/15 0701 - 08/16 0700 In: 560 [P.O.:560] Out: 3650 [Urine:3650] Intake/Output this shift: Total I/O In: -  Out: 750 [Urine:750]   Lab Results: Recent Labs    03/02/21 0038 03/03/21 0029 03/04/21 0204  WBC 17.5* 10.8* 14.1*  HGB 9.6* 8.6* 8.9*  PLT 305 250 278  MCV 82.1 82.4 84.0   BMET Recent Labs    03/02/21 0038 03/03/21 0029 03/04/21 0204  NA 135 134* 132*  K 3.8 4.2 3.8  CL 101 100 99  CO2 '26 26 27  '$ GLUCOSE 172* 193* 196*  BUN 10 5* 5*  CREATININE 0.67 0.68 0.60  CALCIUM 9.7 8.9 9.0   LFT Recent Labs    03/03/21 0029 03/04/21 0204  PROT 6.0* 6.3*  ALBUMIN 3.1* 3.2*  AST 41 22  ALT 15 15  ALKPHOS 64 67  BILITOT 0.6 0.5   PT/INR Recent Labs    03/01/21 1931  INR 1.1   EGD Aug 14th.  Gastritis, erosions, no active bleeding or hematin present   Imaging/Other results: DG CHEST PORT 1  VIEW  Result Date: 03/03/2021 CLINICAL DATA:  Shortness of breath, history chronic bronchitis, coronary artery disease post MI, GERD, type II diabetes mellitus former smoker EXAM: PORTABLE CHEST 1 VIEW COMPARISON:  Portable exam 1241 hours compared to 03/01/2021 FINDINGS: Normal heart size, mediastinal contours, and pulmonary vascularity. Atherosclerotic calcification aorta. Chronic peribronchial thickening without pulmonary infiltrate, pleural effusion, or pneumothorax. Bones unremarkable. IMPRESSION: Chronic bronchitic changes without infiltrate. Aortic Atherosclerosis (ICD10-I70.0). Electronically Signed   By: Lavonia Dana M.D.   On: 03/03/2021 14:15   ECHOCARDIOGRAM COMPLETE  Result Date: 03/04/2021    ECHOCARDIOGRAM REPORT   Patient Name:   [name] Date of Exam: 03/04/2021 Medical Rec #:  [id number]     Height:       62.0 in Accession #:    [id number]    Weight:       154.3 lb Date of Birth:  [birth year]      BSA:          1.712 m Patient Age:    2 years      BP:           134/59 mmHg Patient Gender: F             HR:           72 bpm. Exam Location:  Inpatient Procedure: 2D  Echo, Cardiac Doppler and Color Doppler Indications:    Chest pain  History:        Patient has prior history of Echocardiogram examinations, most                 recent 12/28/2017. CAD; Risk Factors:Former Smoker, Hypertension,                 Diabetes and Sleep Apnea. S/P PCI. DOE.  Sonographer:    Clayton Lefort RDCS (AE) Referring Phys: [id number] Darreld Mclean  Sonographer Comments: Suboptimal subcostal window. IMPRESSIONS  1. Left ventricular ejection fraction, by estimation, is 60 to 65%. The left ventricle has normal function. The left ventricle has no regional wall motion abnormalities. There is mild left ventricular hypertrophy. Left ventricular diastolic parameters are consistent with Grade II diastolic dysfunction (pseudonormalization). Elevated left ventricular end-diastolic pressure.  2. Right ventricular systolic function  is normal. The right ventricular size is normal.  3. The mitral valve is abnormal. Mild mitral valve regurgitation. No evidence of mitral stenosis.  4. The aortic valve is tricuspid. There is moderate calcification of the aortic valve. There is moderate thickening of the aortic valve. Aortic valve regurgitation is not visualized. Mild aortic valve stenosis.  5. The inferior vena cava is normal in size with greater than 50% respiratory variability, suggesting right atrial pressure of 3 mmHg. FINDINGS  Left Ventricle: Left ventricular ejection fraction, by estimation, is 60 to 65%. The left ventricle has normal function. The left ventricle has no regional wall motion abnormalities. The left ventricular internal cavity size was normal in size. There is  mild left ventricular hypertrophy. Left ventricular diastolic parameters are consistent with Grade II diastolic dysfunction (pseudonormalization). Elevated left ventricular end-diastolic pressure. Right Ventricle: The right ventricular size is normal. No increase in right ventricular wall thickness. Right ventricular systolic function is normal. Left Atrium: Left atrial size was normal in size. Right Atrium: Right atrial size was normal in size. Pericardium: There is no evidence of pericardial effusion. Mitral Valve: The mitral valve is abnormal. There is mild thickening of the mitral valve leaflet(s). There is mild calcification of the mitral valve leaflet(s). Mild mitral annular calcification. Mild mitral valve regurgitation. No evidence of mitral valve stenosis. MV peak gradient, 7.1 mmHg. The mean mitral valve gradient is 3.0 mmHg. Tricuspid Valve: The tricuspid valve is normal in structure. Tricuspid valve regurgitation is not demonstrated. No evidence of tricuspid stenosis. Aortic Valve: The aortic valve is tricuspid. There is moderate calcification of the aortic valve. There is moderate thickening of the aortic valve. Aortic valve regurgitation is not visualized.  Mild aortic stenosis is present. Aortic valve mean gradient measures 9.0 mmHg. Aortic valve peak gradient measures 16.0 mmHg. Aortic valve area, by VTI measures 1.81 cm. Pulmonic Valve: The pulmonic valve was normal in structure. Pulmonic valve regurgitation is not visualized. No evidence of pulmonic stenosis. Aorta: The aortic root is normal in size and structure. Venous: The inferior vena cava is normal in size with greater than 50% respiratory variability, suggesting right atrial pressure of 3 mmHg. IAS/Shunts: The interatrial septum was not well visualized.  LEFT VENTRICLE PLAX 2D LVIDd:         4.60 cm  Diastology LVIDs:         2.50 cm  LV e' medial:    5.98 cm/s LV PW:         1.40 cm  LV E/e' medial:  26.4 LV IVS:        1.20 cm  LV e' lateral:   7.40 cm/s LVOT diam:     1.80 cm  LV E/e' lateral: 21.4 LV SV:         85 LV SV Index:   50 LVOT Area:     2.54 cm  RIGHT VENTRICLE RV Basal diam:  2.60 cm RV S prime:     14.70 cm/s TAPSE (M-mode): 2.5 cm LEFT ATRIUM             Index       RIGHT ATRIUM           Index LA diam:        3.50 cm 2.04 cm/m  RA Area:     13.90 cm LA Vol (A2C):   52.7 ml 30.78 ml/m RA Volume:   31.50 ml  18.40 ml/m LA Vol (A4C):   73.4 ml 42.87 ml/m LA Biplane Vol: 63.9 ml 37.32 ml/m  AORTIC VALVE AV Area (Vmax):    1.72 cm AV Area (Vmean):   1.83 cm AV Area (VTI):     1.81 cm AV Vmax:           200.00 cm/s AV Vmean:          143.000 cm/s AV VTI:            0.471 m AV Peak Grad:      16.0 mmHg AV Mean Grad:      9.0 mmHg LVOT Vmax:         135.00 cm/s LVOT Vmean:        103.000 cm/s LVOT VTI:          0.335 m LVOT/AV VTI ratio: 0.71  AORTA Ao Root diam: 2.90 cm Ao Asc diam:  3.40 cm MITRAL VALVE MV Area (PHT): 2.64 cm     SHUNTS MV Area VTI:   2.47 cm     Systemic VTI:  0.34 m MV Peak grad:  7.1 mmHg     Systemic Diam: 1.80 cm MV Mean grad:  3.0 mmHg MV Vmax:       1.33 m/s MV Vmean:      74.9 cm/s MV Decel Time: 287 msec MV E velocity: 158.00 cm/s MV A velocity: 126.00 cm/s  MV E/A ratio:  1.25 Jenkins Rouge MD Electronically signed by Jenkins Rouge MD Signature Date/Time: 03/04/2021/11:03:37 AM    Final       Assessment and Plan:  70 y/o female admitted with abdominal pain and iron deficiency anemia.  CT with suspected left sided diverticulitis, also noted to have cirrhotic appearing liver with evidence of portal hypertension.  EGD with gastritis/erosions without overt bleeding and small esophageal varices without stigmata, not amenable to banding.   Her hgb has remained stable and she has not had any evidence of GI bleeding.    Iron deficiency anemia Possibly secondary to gastritis noted on EGD.  Biopsies pending to determine if H. Pylori or celiac present (which both can cause IDA).  Her last colonoscopy was in 2016.  A repeat colonoscopy is reasonable given the new IDA, but with her diverticulitis, the colonoscopy should be delayed at least 4-6 weeks to reduce risk of perforation.  Given that she is not having frank hematochezia, an inpatient colonoscopy is not necessary. - Outpatient colonoscopy - PO iron  - Daily PPI - Please reconsult if patient demonstrates active bleeding.  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  Lab work up negative for other causes of chronic liver disease.  Low MELD, compensated.  No further evaluation needed  at this time.  She will need regular GI follow up. - Follow up as outpatient - Avoid alcohol, NSAIDs  Sigmoid diverticulitis Uncomplicated, afebrile, not requiring IV narcotics for pain control.  Tolerating PO.  Slight increase in WBC today, but she does appear to have a high baseline WBC.    As mentioned above, repeat colonoscopy is indicated, but recommend waiting 4-6 weeks to let diverticulitis resolve. Some of her ongoing pain and nausea may be related to constipation, as she states it has been 3-4 days since her last bowel movement - Recommend giving Miralax or Senna to help with bowel movement - Consider transitioning to PO antibiotics in  preparation for discharge  GI will sign off for now.  Please reconsult with any additional questions/concerns.   Daryel November, MD  03/04/2021, 1:33 PM [facility]

## 2021-03-04 NOTE — Progress Notes (Signed)
Pharmacy Antibiotic Note  [name] is a 70 y.o. female admitted on 03/01/2021 with  intra-abdominal infection .  Pharmacy has been consulted for unasyn dosing.  WBC 14.1, Scr 0.6 (CrCl 58 mL/min). Afebrile. Has been on ceftriaxone/metronidazole for 2 days.   Plan: Switch antibiotics to unasyn 3g IV every 6 hours Monitor LOT, cx results, clinical pic  Height: '5\' 2"'$  (157.5 cm) Weight: 70 kg (154 lb 5.2 oz) IBW/kg (Calculated) : 50.1  Temp (24hrs), Avg:98.4 F (36.9 C), Min:98.2 F (36.8 C), Max:98.6 F (37 C)  Recent Labs  Lab 03/01/21 1123 03/02/21 0038 03/03/21 0029 03/04/21 0204  WBC 14.2* 17.5* 10.8* 14.1*  CREATININE 0.81 0.67 0.68 0.60    Estimated Creatinine Clearance: 58.3 mL/min (by C-G formula based on SCr of 0.6 mg/dL).    Allergies  Allergen Reactions   Erythromycin Base Diarrhea   Azithromycin Other (See Comments)    Upset stomach    Pollen Extract Cough    Runny nose   Remeron [Mirtazapine] Other (See Comments)    Makes her too sleepy   Zolpidem Tartrate Other (See Comments)    amnesia   Other Rash and Other (See Comments)    Grass, dust, green beans, corn, cabbage Reaction: upset stomach   Sulfonamide Derivatives Rash    Mouth sores   Tree Extract Rash    Antimicrobials this admission: Ceftriaxone 8/14 >> 8/16 Metronidazole 8/14 >> 6/16 Unasyn 8/16>>  Dose adjustments this admission: N/A  Microbiology results: 8/13 COVID PCR: neg  Thank you for allowing pharmacy to be a part of this patient's care.  Antonietta Jewel, PharmD, [place] Clinical Pharmacist  Phone: [phone] 03/04/2021 10:43 AM  Please check AMION for all [place] phone numbers After 10:00 PM, call [place] [phone]

## 2021-03-04 NOTE — Plan of Care (Signed)
  Problem: Education: Goal: Knowledge of General Education information will improve Description: Including pain rating scale, medication(s)/side effects and non-pharmacologic comfort measures Outcome: Progressing   Problem: Activity: Goal: Risk for activity intolerance will decrease Outcome: Progressing   Problem: Nutrition: Goal: Adequate nutrition will be maintained Outcome: Progressing   Problem: Pain Managment: Goal: General experience of comfort will improve Outcome: Progressing   Problem: Education: Goal: Ability to identify signs and symptoms of gastrointestinal bleeding will improve Outcome: Progressing

## 2021-03-04 NOTE — Plan of Care (Signed)
  Problem: Education: Goal: Knowledge of General Education information will improve Description Including pain rating scale, medication(s)/side effects and non-pharmacologic comfort measures Outcome: Progressing   

## 2021-03-04 NOTE — Progress Notes (Signed)
Physical Therapy Evaluation Patient Details Name: [name] MRN: [id number] DOB: September 22, 1950 Today's Date: 03/04/2021   History of Present Illness  70 yo female was admitted with SOB, diarrhea, dark stool, abd pain and found to have gastritis, anemia with transfusion done, esophageal varices, new cirrhosis discovered, and susp diverticulitis.  PMHx:  colon polyps, diverticulosis, CAD with stent, HTN, DM, Barrett esophagus, IBS, OSA on CPAP, angina, mood disorder with anxiety  Clinical Impression  Pt was seen for initial mobility and is mainly limited by her pain.  Worked with her to initiate gait and will expect her to continue to progress with distance as her pain is better controlled.  Follow along with her, recommend home therapy and will expect her supportive family can assist at DC.  Pt is motivated to try despite the pain.    Follow Up Recommendations Home health PT    Equipment Recommendations  Rolling walker with 5" wheels (depending on her progress)    Recommendations for Other Services       Precautions / Restrictions Precautions Precautions: Fall Precaution Comments: abd pain with any movement Restrictions Weight Bearing Restrictions: No Other Position/Activity Restrictions: monitor sats and HR      Mobility  Bed Mobility Overal bed mobility: Needs Assistance Bed Mobility: Supine to Sit;Sit to Supine     Supine to sit: Min assist Sit to supine: Min assist   General bed mobility comments: min assist to support her effort and reduce any stress on abd area    Transfers Overall transfer level: Needs assistance Equipment used: Rolling walker (2 wheeled);1 person hand held assist Transfers: Sit to/from Stand Sit to Stand: Min assist         General transfer comment: minor support for all mobility is not related to strength but rather pain'  Ambulation/Gait Ambulation/Gait assistance: Min guard Gait Distance (Feet): 10 Feet Assistive device: Rolling walker  (2 wheeled);1 person hand held assist Gait Pattern/deviations: Decreased stride length;Step-to pattern;Trunk flexed;Wide base of support Gait velocity: reduced Gait velocity interpretation: <1.31 ft/sec, indicative of household ambulator General Gait Details: pt is cautious about her moving on walker, has a heavier walker with PT assisting and will benefit from a ligher version  Stairs            Wheelchair Mobility    Modified Rankin (Stroke Patients Only)       Balance Overall balance assessment: Needs assistance   Sitting balance-Leahy Scale: Fair     Standing balance support: Bilateral upper extremity supported;During functional activity Standing balance-Leahy Scale: Poor Standing balance comment: requires walker to stand                             Pertinent Vitals/Pain Pain Assessment: Faces Faces Pain Scale: Hurts even more Pain Location: abdomen with effort to get OOB and back Pain Descriptors / Indicators: Grimacing;Guarding Pain Intervention(s): Repositioned;Monitored during session;Other (comment) (discussed position of the bed and body mechanics)    Home Living Family/patient expects to be discharged to:: Private residence Living Arrangements: Spouse/significant other Available Help at Discharge: Family;Available 24 hours/day Type of Home: House Home Access: Stairs to enter Entrance Stairs-Rails: None Entrance Stairs-Number of Steps: 1 Home Layout: One level Home Equipment: Other (comment) (Pt was not clear on what she has) Additional Comments: will potentially need walker    Prior Function Level of Independence: Independent               Hand Dominance  Dominant Hand: Right    Extremity/Trunk Assessment   Upper Extremity Assessment Upper Extremity Assessment: Overall WFL for tasks assessed    Lower Extremity Assessment Lower Extremity Assessment: Overall WFL for tasks assessed    Cervical / Trunk Assessment Cervical /  Trunk Assessment: Normal  Communication   Communication: No difficulties  Cognition Arousal/Alertness: Awake/alert Behavior During Therapy: Anxious Overall Cognitive Status: Within Functional Limits for tasks assessed                                 General Comments: pt is a bit fearful about causing pain      General Comments General comments (skin integrity, edema, etc.): Pt was able to mobilize on walker and on bed with help, limited by pain.  Talked with nursing about her discomfort despite strong meds    Exercises     Assessment/Plan    PT Assessment Patient needs continued PT services  PT Problem List Decreased mobility;Pain       PT Treatment Interventions DME instruction;Gait training;Functional mobility training;Therapeutic activities;Therapeutic exercise;Balance training;Neuromuscular re-education;Patient/family education    PT Goals (Current goals can be found in the Care Plan section)  Acute Rehab PT Goals Patient Stated Goal: to get home and have pain gone PT Goal Formulation: With patient Time For Goal Achievement: 03/11/21 Potential to Achieve Goals: Good    Frequency Min 3X/week   Barriers to discharge   pain inhibits her movement    Co-evaluation               AM-PAC PT "6 Clicks" Mobility  Outcome Measure Help needed turning from your back to your side while in a flat bed without using bedrails?: None Help needed moving from lying on your back to sitting on the side of a flat bed without using bedrails?: A Little Help needed moving to and from a bed to a chair (including a wheelchair)?: A Little Help needed standing up from a chair using your arms (e.g., wheelchair or bedside chair)?: A Little Help needed to walk in hospital room?: A Little Help needed climbing 3-5 steps with a railing? : A Lot 6 Click Score: 18    End of Session Equipment Utilized During Treatment: Gait belt Activity Tolerance: Patient limited by  fatigue;Patient limited by pain Patient left: in bed;with call bell/phone within reach;with bed alarm set Nurse Communication: Mobility status PT Visit Diagnosis: Unsteadiness on feet (R26.81);Pain Pain - Right/Left:  (central) Pain - part of body:  (abdomen)    Time: [id number] PT Time Calculation (min) (ACUTE ONLY): 21 min   Charges:   PT Evaluation $PT Eval Moderate Complexity: 1 Mod         Ramond Dial 03/04/2021, 4:09 PM  Mee Hives, PT MS Acute Rehab Dept. Number: [place] [facility] [place]

## 2021-03-04 NOTE — Plan of Care (Signed)
  Problem: Education: Goal: Knowledge of General Education information will improve Description: Including pain rating scale, medication(s)/side effects and non-pharmacologic comfort measures 03/04/2021 2010 by Nelia Shi, RN Outcome: Progressing 03/04/2021 2010 by Nelia Shi, RN Outcome: Progressing

## 2021-03-04 NOTE — Progress Notes (Signed)
  Echocardiogram 2D Echocardiogram has been performed.  Marybelle Killings 03/04/2021, 10:00 AM

## 2021-03-04 NOTE — Care Management Important Message (Signed)
Important Message  Patient Details  Name: [name] MRN: [id number] Date of Birth: [birth year]   Medicare Important Message Given:  Yes     Orbie Pyo 03/04/2021, 4:12 PM

## 2021-03-04 NOTE — Progress Notes (Signed)
[facility] Progress Note  Patient: [name]    [id number]  DOA: 03/01/2021     Date of Service: the patient was seen and examined on 03/04/2021  Brief hospital course: [name] is a 70 y.o. female with medical history significant of diverticulosis and colon polyps, CAD with stenting, HTN, IDDM,Barrett esophagus, IBS, OSA on CPAP presented with persistent abdominal pain and diarrhea, dark-colored stool, and increasing shortness of breath. Although symptomatic anemia, diverticulitis. GI consulted underwent EGD.  Currently plan is continue to monitor for improvement in abdominal pain and tolerance of oral diet.  Subjective: Still has chest pain still has abdominal pain.  No nausea no vomiting.  Passing gas but no BM.  No fever no chills.  Assessment and Plan: 1.  Symptomatic anemia Iron deficiency anemia Melena Abdominal pain. SP 2 episodes of transfusion. Further work-up for anemia. EGD performed shows evidence of gastritis with bleeding. Continue PPI. Varices without any acute bleeding. Will monitor further recommendation from GI for work-up Consider starting due to prophylaxis as long as H&H remained stable.  Colonoscopy and/or Capsule endoscopy outpatient. On IV ceftriaxone and Flagyl, will switch to IV Unasyn.   2.  New diagnosis of cirrhosis as well as esophageal varices. Likely NASH No significant ascites. Was started on octreotide infusion. No significant bleeding seen on EGD.  Work-up initiated per GI so far negative.  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  Outpatient follow-up.   3.  Diverticulitis Started on IV ceftriaxone and Flagyl. GI advance the patient to carb modified diet but due to patient's persistent pain with eating I am going to lower it to full liquid diet for now. If the patient continues to have pain I would actually recommend to just be on clear liquid diet for now. On IV ceftriaxone and Flagyl.  We will switch to IV Unasyn.   4.  CAD Angina Not a  good candidate for any anticoagulation right now. Likely her angina is associated with her anemia. [place] cardiology consultation. EKG unremarkable.  Troponin x2 unremarkable.  Chest x-ray unremarkable despite patient's ongoing chest pain which feels like angina. Cardiology recommends outpatient follow-up once her hemoglobin is stabilized. Currently on Imdur.   5.  Mood disorder and anxiety Continue Xanax and Cymbalta.  Continue trazodone nightly.  Scheduled Meds:  alum & mag hydroxide-simeth  30 mL Oral QID   bisacodyl  10 mg Rectal Once   diltiazem  120 mg Oral q morning   DULoxetine  60 mg Oral BID   hydrochlorothiazide  12.5 mg Oral Daily   insulin aspart  0-15 Units Subcutaneous TID WC   insulin aspart  0-5 Units Subcutaneous QHS   insulin glargine-yfgn  12 Units Subcutaneous Daily   isosorbide mononitrate  30 mg Oral Daily   losartan  50 mg Oral Daily   metoprolol tartrate  25 mg Oral BID   montelukast  10 mg Oral QHS   multivitamin with minerals  1 tablet Oral Daily   pantoprazole  40 mg Oral BID AC   polyethylene glycol  17 g Oral Daily   Ensure Max Protein  11 oz Oral BID   traZODone  200 mg Oral QHS   Continuous Infusions:  ampicillin-sulbactam (UNASYN) IV Stopped (03/04/21 1414)   PRN Meds: albuterol, ALPRAZolam, dicyclomine, hydrALAZINE, HYDROcodone-acetaminophen, metoprolol tartrate, morphine injection  Body mass index is 28.23 kg/m.  Nutrition Problem: Inadequate oral intake Etiology: acute illness (gastritis)     DVT Prophylaxis: Consider starting DVT prophylaxis tomorrow as long as H&H remained  stable. SCDs Start: 03/01/21 1634    Advance goals of care discussion: Pt is Full code.  Family Communication: no family was present at bedside, at the time of interview.   Data Reviewed: I have personally reviewed and interpreted daily labs, tele strips, imaging. LFTs stable.  Creatinine stable.  Sodium 132.  WBC increased from 10-14.  Hemoglobin stable  8.9.  Platelets stable 278.  Physical Exam:  General: Appear in mild distress, no Rash; Oral Mucosa Clear, moist. no Abnormal Neck Mass Or lumps, Conjunctiva normal  Cardiovascular: S1 and S2 Present, no Murmur, Respiratory: good respiratory effort, Bilateral Air entry present and CTA, no Crackles, no wheezes Abdomen: Bowel Sound present, Soft and diffuse abdominal tenderness Extremities: no Pedal edema Neurology: alert and oriented to time, place, and person affect appropriate. no new focal deficit Gait not checked due to patient safety concerns    Vitals:   03/04/21 0401 03/04/21 0726 03/04/21 1004 03/04/21 1109  BP: (!) 125/46 (!) 134/59 (!) 152/52 (!) 160/61  Pulse: 63 72 77   Resp: '14 20  20  '$ Temp: 98.3 F (36.8 C) 98.3 F (36.8 C)  98.5 F (36.9 C)  TempSrc: Oral Oral    SpO2: 95% 91%    Weight:      Height:        Disposition:  Status is: Inpatient  Remains inpatient appropriate because:IV treatments appropriate due to intensity of illness or inability to take PO  Dispo: The patient is from: Home              Anticipated d/c is to: Home              Patient currently is not medically stable to d/c.   Difficult to place patient No  Time spent: 35 minutes. I reviewed all nursing notes, pharmacy notes, vitals, pertinent old records. I have discussed plan of care as described above with RN.  Author: Berle Mull, MD [facility] 03/04/2021 6:30 PM  To reach On-call, see care teams to locate the attending and reach out via [email or web address]. Between 7PM-7AM, please contact night-coverage If you still have difficulty reaching the attending provider, please page the [facility] (Director on Call) for [facility] on amion for assistance.

## 2021-03-05 ENCOUNTER — Telehealth: Payer: Self-pay

## 2021-03-05 DIAGNOSIS — R188 Other ascites: Secondary | ICD-10-CM

## 2021-03-05 LAB — CBC WITH DIFFERENTIAL/PLATELET
Abs Immature Granulocytes: 0.04 10*3/uL (ref 0.00–0.07)
Basophils Absolute: 0.1 10*3/uL (ref 0.0–0.1)
Basophils Relative: 1 %
Eosinophils Absolute: 0.6 10*3/uL — ABNORMAL HIGH (ref 0.0–0.5)
Eosinophils Relative: 4 %
HCT: 30.3 % — ABNORMAL LOW (ref 36.0–46.0)
Hemoglobin: 9.5 g/dL — ABNORMAL LOW (ref 12.0–15.0)
Immature Granulocytes: 0 %
Lymphocytes Relative: 43 %
Lymphs Abs: 6.5 10*3/uL — ABNORMAL HIGH (ref 0.7–4.0)
MCH: 25.9 pg — ABNORMAL LOW (ref 26.0–34.0)
MCHC: 31.4 g/dL (ref 30.0–36.0)
MCV: 82.6 fL (ref 80.0–100.0)
Monocytes Absolute: 1.4 10*3/uL — ABNORMAL HIGH (ref 0.1–1.0)
Monocytes Relative: 9 %
Neutro Abs: 6.6 10*3/uL (ref 1.7–7.7)
Neutrophils Relative %: 43 %
Platelets: 247 10*3/uL (ref 150–400)
RBC: 3.67 MIL/uL — ABNORMAL LOW (ref 3.87–5.11)
RDW: 19.1 % — ABNORMAL HIGH (ref 11.5–15.5)
WBC: 15.2 10*3/uL — ABNORMAL HIGH (ref 4.0–10.5)
nRBC: 0 % (ref 0.0–0.2)

## 2021-03-05 LAB — GLUCOSE, CAPILLARY
Glucose-Capillary: 198 mg/dL — ABNORMAL HIGH (ref 70–99)
Glucose-Capillary: 184 mg/dL — ABNORMAL HIGH (ref 70–99)
Glucose-Capillary: 219 mg/dL — ABNORMAL HIGH (ref 70–99)
Glucose-Capillary: 217 mg/dL — ABNORMAL HIGH (ref 70–99)

## 2021-03-05 LAB — COMPREHENSIVE METABOLIC PANEL
ALT: 14 U/L (ref 0–44)
AST: 23 U/L (ref 15–41)
Albumin: 3.1 g/dL — ABNORMAL LOW (ref 3.5–5.0)
Alkaline Phosphatase: 69 U/L (ref 38–126)
Anion gap: 8 (ref 5–15)
BUN: <5 mg/dL — ABNORMAL LOW (ref 8–23)
CO2: 25 mmol/L (ref 22–32)
Calcium: 8.8 mg/dL — ABNORMAL LOW (ref 8.9–10.3)
Chloride: 97 mmol/L — ABNORMAL LOW (ref 98–111)
Creatinine, Ser: 0.63 mg/dL (ref 0.44–1.00)
GFR, Estimated: >60 mL/min (ref >60)
Glucose, Bld: 174 mg/dL — ABNORMAL HIGH (ref 70–99)
Potassium: 3.9 mmol/L (ref 3.5–5.1)
Sodium: 130 mmol/L — ABNORMAL LOW (ref 135–145)
Total Bilirubin: 0.8 mg/dL (ref 0.3–1.2)
Total Protein: 6.1 g/dL — ABNORMAL LOW (ref 6.5–8.1)

## 2021-03-05 MED ORDER — ENOXAPARIN SODIUM 40 MG/0.4ML IJ SOSY
40.0000 mg | PREFILLED_SYRINGE | Freq: Every day | INTRAMUSCULAR | Status: DC
  Administered 2021-03-05 – 2021-03-08 (×4): 40 mg via SUBCUTANEOUS
  Filled 2021-03-05 (×3): qty 0.4

## 2021-03-05 MED ORDER — ONDANSETRON HCL 4 MG/2ML IJ SOLN
4.0000 mg | Freq: Four times a day (QID) | INTRAMUSCULAR | Status: DC | PRN
  Administered 2021-03-05 – 2021-03-07 (×2): 4 mg via INTRAVENOUS
  Filled 2021-03-05 (×2): qty 2

## 2021-03-05 MED ORDER — DICYCLOMINE HCL 10 MG PO CAPS
10.0000 mg | ORAL_CAPSULE | Freq: Three times a day (TID) | ORAL | Status: DC
  Administered 2021-03-05 – 2021-03-08 (×10): 10 mg via ORAL
  Filled 2021-03-05 (×10): qty 1

## 2021-03-05 MED ORDER — DIPHENHYDRAMINE HCL 25 MG PO CAPS
25.0000 mg | ORAL_CAPSULE | Freq: Four times a day (QID) | ORAL | Status: AC | PRN
  Administered 2021-03-05 (×2): 25 mg via ORAL
  Filled 2021-03-05 (×2): qty 1

## 2021-03-05 NOTE — Telephone Encounter (Signed)
Patient is scheduled for a follow up with Dr. [name] on Tuesday, 04/15/21 at 11 am.   My chart message sent to patient with her appt information. Will mail a letter as well.

## 2021-03-05 NOTE — Progress Notes (Signed)
Physical Therapy Treatment Patient Details Name: [name] MRN: [id number] DOB: 06-23-1951 Today's Date: 03/05/2021    History of Present Illness 70 yo female admitted 8/13 with SOB, diarrhea, dark stool, abd pain and found to have gastritis, anemia with transfusion done, esophageal varices, new cirrhosis discovered, and susp diverticulitis.  PMHx:  colon polyps, diverticulosis, CAD with stent, HTN, DM, Barrett esophagus, IBS, OSA on CPAP, angina, mood disorder with anxiety    PT Comments    Pt mentioned feeling fatigued upon entry but was motivated to walk. Pt demonstrated increased ambulation distance compared to previous session, limited by pain in L heel. Pt educated on elevating LEs to alleviate friction on L heel. Continue to recommend [place], as pt is progressing toward goals, with remaining deficits with balance and activity tolerance.   Follow Up Recommendations  Home health PT     Equipment Recommendations  Rolling walker with 5" wheels    Recommendations for Other Services       Precautions / Restrictions Precautions Precautions: Fall Restrictions Weight Bearing Restrictions: No    Mobility  Bed Mobility Overal bed mobility: Needs Assistance Bed Mobility: Supine to Sit     Supine to sit: Supervision;HOB elevated     General bed mobility comments: Supervision for safety. Performed with increased time.    Transfers Overall transfer level: Needs assistance Equipment used: Rolling walker (2 wheeled) Transfers: Sit to/from Stand Sit to Stand: Min assist         General transfer comment: Pt eager to stand and performed sit to stand x2. 1st trial pt cleared buttock without assist but could not fully achieve upright position. 2nd trial min A provided to power up to standing. Pt mentioned feeling heel pain with weight bearing. Verbal cues provided for hand placement  Ambulation/Gait Ambulation/Gait assistance: Min guard Gait Distance (Feet): 120 Feet Assistive  device: Rolling walker (2 wheeled) Gait Pattern/deviations: Decreased stride length;Step-through pattern;Antalgic Gait velocity: Decreased Gait velocity interpretation: 1.31 - 2.62 ft/sec, indicative of limited community ambulator General Gait Details: Pt demonstrated antalgic gait due to heel pain with great reliance on RW. Min guard for safety.   Stairs             Wheelchair Mobility    Modified Rankin (Stroke Patients Only)       Balance Overall balance assessment: Needs assistance Sitting-balance support: Feet supported;No upper extremity supported Sitting balance-Leahy Scale: Fair     Standing balance support: Bilateral upper extremity supported;During functional activity Standing balance-Leahy Scale: Poor Standing balance comment: Requires BUE support.                            Cognition Arousal/Alertness: Awake/alert Behavior During Therapy: WFL for tasks assessed/performed Overall Cognitive Status: Within Functional Limits for tasks assessed                                        Exercises General Exercises - Lower Extremity Ankle Circles/Pumps: AROM;Both;10 reps;Seated Long Arc Quad: AROM;Both;15 reps;Seated Hip Flexion/Marching: AROM;10 reps;Standing;Both;Other (comment) (Pain on L heel with weight bearing)    General Comments General comments (skin integrity, edema, etc.): VSS. Pt reported pain on L heel with weight bearing which appeared slightly red; Skin integrity intact. Repositioned and notified RN      Pertinent Vitals/Pain Pain Assessment: 0-10 Pain Score: 8  Pain Location: L heel Pain Descriptors /  Indicators: Discomfort;Grimacing;Guarding;Moaning;Tightness Pain Intervention(s): Limited activity within patient's tolerance;Repositioned;Monitored during session    Home Living                      Prior Function            PT Goals (current goals can now be found in the care plan section) Acute Rehab  PT Goals Patient Stated Goal: to get home and have pain gone PT Goal Formulation: With patient Time For Goal Achievement: 03/11/21 Potential to Achieve Goals: Good Progress towards PT goals: Progressing toward goals    Frequency    Min 3X/week      PT Plan Current plan remains appropriate    Co-evaluation              AM-PAC PT "6 Clicks" Mobility   Outcome Measure  Help needed turning from your back to your side while in a flat bed without using bedrails?: None Help needed moving from lying on your back to sitting on the side of a flat bed without using bedrails?: None Help needed moving to and from a bed to a chair (including a wheelchair)?: A Little Help needed standing up from a chair using your arms (e.g., wheelchair or bedside chair)?: A Little Help needed to walk in hospital room?: A Little Help needed climbing 3-5 steps with a railing? : A Lot 6 Click Score: 19    End of Session Equipment Utilized During Treatment: Gait belt Activity Tolerance: Patient limited by pain Patient left: in chair;with chair alarm set;with call bell/phone within reach Nurse Communication: Mobility status;Other (comment) (Pain in L heel) PT Visit Diagnosis: Unsteadiness on feet (R26.81);Muscle weakness (generalized) (M62.81);Other abnormalities of gait and mobility (R26.89)     Time: [id number] PT Time Calculation (min) (ACUTE ONLY): 28 min  Charges:  $Gait Training: 8-22 mins $Therapeutic Activity: 8-22 mins                     Louie Casa, SPT Acute Rehab: [phone] [id number]     Domingo Dimes 03/05/2021, 2:14 PM

## 2021-03-05 NOTE — Progress Notes (Addendum)
[facility] Progress Note  Patient: [name]    [id number]  DOA: 03/01/2021     Date of Service: the patient was seen and examined on 03/05/2021  Brief hospital course: [name] is a 70 y.o. female with medical history significant of diverticulosis and colon polyps, CAD with stenting, HTN, IDDM,Barrett esophagus, IBS, OSA on CPAP presented with persistent abdominal pain and diarrhea, dark-colored stool, and increasing shortness of breath. -CT with question diverticulitis, she was also noted to have cirrhosis with portal hypertension,  -Gastroenterology consulted, EGD noted grade 1 varices, gastritis  Subjective: Still has chest pain still has abdominal pain.  No nausea no vomiting.  Passing gas but no BM.  No fever no chills.  Assessment and Plan:  Iron deficiency anemia Melena Acute on chronic abdominal pain. -Transfused 2 units of PRBC this admission, gastroenterology consulted, EGD noted gastritis and grade 1 varices -Hemoglobin has been stable -GI recommended colonoscopy as outpatient -Change dicyclomine to 3 times daily for subacute/chronic abdominal pains, spasms -Clinical suspicion for active diverticulitis is low, I suspect mild colon wall edema could be secondary to mild ascites -Advance diet, supportive care   New diagnosis of cirrhosis, portal hypertension as well as esophageal varices. Likely NASH -Mild ascites only, EGD with grade 1 varices -Outpatient gastroenterology follow-up   Possible diverticulitis -Treated with IV ceftriaxone and Flagyl, this was then switched to Unasyn -Abdominal exam is unremarkable, will advance diet, supportive care   CAD Angina -Appreciate cardiology input, her angina was suspected to be associated with anemia -EKG unremarkable, high-sensitivity troponin was negative -Cardiology recommended outpatient follow-up once her hemoglobin has stabilized -Continue Imdur   Mood disorder and anxiety Continue Xanax and  Cymbalta.  Continue trazodone nightly.  Body mass index is 28.23 kg/m.  Nutrition Problem: Inadequate oral intake Etiology: acute illness (gastritis)     DVT Prophylaxis: add lovenox, no acting bleeding any more    Advance goals of care discussion: Pt is Full code.  Family Communication: no family was present at bedside, at the time of interview.    Disposition:  Status is: Inpatient  Remains inpatient appropriate because:IV treatments appropriate due to intensity of illness or inability to take PO  Dispo: The patient is from: Home              Anticipated d/c is to: Home              Patient currently is not medically stable to d/c.   Difficult to place patient No  Data Reviewed: I have personally reviewed and interpreted daily labs, tele strips, imaging. LFTs stable.  Creatinine stable.  Sodium 132.  WBC increased from 10-14.  Hemoglobin stable 8.9.  Platelets stable 278.  Physical Exam:  General: Appear in mild distress, no Rash; Oral Mucosa Clear, moist. no Abnormal Neck Mass Or lumps, Conjunctiva normal  Cardiovascular: S1 and S2 Present, no Murmur, Respiratory: good respiratory effort, Bilateral Air entry present and CTA, no Crackles, no wheezes Abdomen: Bowel Sound present, Soft and diffuse abdominal tenderness Extremities: no Pedal edema Neurology: alert and oriented to time, place, and person affect appropriate. no new focal deficit Gait not checked due to patient safety concerns    Vitals:   03/04/21 2330 03/05/21 0254 03/05/21 0734 03/05/21 1053  BP: (!) 117/56 119/74 (!) 135/48 (!) 110/51  Pulse: 64 64 72 (!) 57  Resp: '18 17 18 17  '$ Temp: 98.5 F (36.9 C) 98 F (36.7 C) 98 F (36.7 C) 98 F (36.7  C)  TempSrc: Oral Oral Oral Oral  SpO2: 93% 95% 93% 92%  Weight:      Height:       Scheduled Meds:  alum & mag hydroxide-simeth  30 mL Oral QID   bisacodyl  10 mg Rectal Once   dicyclomine  10 mg Oral TID AC   diltiazem  120 mg Oral q morning    DULoxetine  60 mg Oral BID   insulin aspart  0-15 Units Subcutaneous TID WC   insulin aspart  0-5 Units Subcutaneous QHS   insulin glargine-yfgn  12 Units Subcutaneous Daily   isosorbide mononitrate  30 mg Oral Daily   losartan  50 mg Oral Daily   metoprolol tartrate  25 mg Oral BID   montelukast  10 mg Oral QHS   multivitamin with minerals  1 tablet Oral Daily   pantoprazole  40 mg Oral BID AC   polyethylene glycol  17 g Oral Daily   Ensure Max Protein  11 oz Oral BID   traZODone  200 mg Oral QHS   Continuous Infusions:  ampicillin-sulbactam (UNASYN) IV 3 g (03/05/21 1212)   PRN Meds: albuterol, ALPRAZolam, diphenhydrAMINE, hydrALAZINE, HYDROcodone-acetaminophen, metoprolol tartrate, morphine injection, ondansetron (ZOFRAN) IV   Time spent: 80mn  Author: PBerle Mull MD [facility] 03/05/2021 2:21 PM

## 2021-03-05 NOTE — Telephone Encounter (Signed)
-----   Message from Vena Rua, PA-C sent at 03/04/2021  4:35 PM EDT ----- Regarding: follow up appt Hi.  Can u arrange a follow up appt for Ms [name] Dr [name], next available.  Fup anemia and will need Colonoscopy arranged.    She is going home, so notify her by mail, phone, my chart (if she had signed up) of her fup  Thanks.

## 2021-03-05 NOTE — Plan of Care (Signed)

## 2021-03-05 NOTE — Progress Notes (Signed)
RT note. Patient refuse cpap tonight 

## 2021-03-06 ENCOUNTER — Inpatient Hospital Stay (HOSPITAL_COMMUNITY): Payer: Medicare HMO

## 2021-03-06 LAB — COMPREHENSIVE METABOLIC PANEL
ALT: 14 U/L (ref 0–44)
AST: 20 U/L (ref 15–41)
Albumin: 3 g/dL — ABNORMAL LOW (ref 3.5–5.0)
Alkaline Phosphatase: 58 U/L (ref 38–126)
Anion gap: 6 (ref 5–15)
BUN: 8 mg/dL (ref 8–23)
CO2: 30 mmol/L (ref 22–32)
Calcium: 8.7 mg/dL — ABNORMAL LOW (ref 8.9–10.3)
Chloride: 96 mmol/L — ABNORMAL LOW (ref 98–111)
Creatinine, Ser: 0.95 mg/dL (ref 0.44–1.00)
GFR, Estimated: >60 mL/min (ref >60)
Glucose, Bld: 167 mg/dL — ABNORMAL HIGH (ref 70–99)
Potassium: 3.9 mmol/L (ref 3.5–5.1)
Sodium: 132 mmol/L — ABNORMAL LOW (ref 135–145)
Total Bilirubin: 0.4 mg/dL (ref 0.3–1.2)
Total Protein: 5.7 g/dL — ABNORMAL LOW (ref 6.5–8.1)

## 2021-03-06 LAB — CBC
HCT: 27.6 % — ABNORMAL LOW (ref 36.0–46.0)
Hemoglobin: 8.4 g/dL — ABNORMAL LOW (ref 12.0–15.0)
MCH: 25.1 pg — ABNORMAL LOW (ref 26.0–34.0)
MCHC: 30.4 g/dL (ref 30.0–36.0)
MCV: 82.6 fL (ref 80.0–100.0)
Platelets: 245 10*3/uL (ref 150–400)
RBC: 3.34 MIL/uL — ABNORMAL LOW (ref 3.87–5.11)
RDW: 18.8 % — ABNORMAL HIGH (ref 11.5–15.5)
WBC: 13.4 10*3/uL — ABNORMAL HIGH (ref 4.0–10.5)
nRBC: 0 % (ref 0.0–0.2)

## 2021-03-06 LAB — GLUCOSE, CAPILLARY
Glucose-Capillary: 164 mg/dL — ABNORMAL HIGH (ref 70–99)
Glucose-Capillary: 333 mg/dL — ABNORMAL HIGH (ref 70–99)
Glucose-Capillary: 174 mg/dL — ABNORMAL HIGH (ref 70–99)
Glucose-Capillary: 336 mg/dL — ABNORMAL HIGH (ref 70–99)

## 2021-03-06 LAB — URIC ACID: Uric Acid, Serum: 3.2 mg/dL (ref 2.5–7.1)

## 2021-03-06 MED ORDER — FUROSEMIDE 10 MG/ML IJ SOLN
20.0000 mg | Freq: Once | INTRAMUSCULAR | Status: AC
  Administered 2021-03-06: 20 mg via INTRAVENOUS
  Filled 2021-03-06: qty 2

## 2021-03-06 MED ORDER — DIPHENHYDRAMINE HCL 25 MG PO CAPS
25.0000 mg | ORAL_CAPSULE | ORAL | Status: DC | PRN
  Administered 2021-03-06: 25 mg via ORAL
  Filled 2021-03-06: qty 1

## 2021-03-06 MED ORDER — SIMETHICONE 80 MG PO CHEW
80.0000 mg | CHEWABLE_TABLET | Freq: Four times a day (QID) | ORAL | Status: DC | PRN
  Administered 2021-03-06 (×2): 80 mg via ORAL
  Filled 2021-03-06 (×2): qty 1

## 2021-03-06 MED ORDER — HYDROMORPHONE HCL 1 MG/ML IJ SOLN
1.0000 mg | INTRAMUSCULAR | Status: DC | PRN
  Administered 2021-03-06 – 2021-03-07 (×2): 1 mg via INTRAVENOUS
  Filled 2021-03-06 (×3): qty 1

## 2021-03-06 NOTE — Progress Notes (Signed)
Inpatient Diabetes Program Recommendations  AACE/ADA: New Consensus Statement on Inpatient Glycemic Control   Target Ranges:  Prepandial:   less than 140 mg/dL      Peak postprandial:   less than 180 mg/dL (1-2 hours)      Critically ill patients:  140 - 180 mg/dL    Results for NORMANI, BORNTREGER (MRN [id number]) as of 03/06/2021 13:51  Ref. Range 03/05/2021 06:25 03/05/2021 11:22 03/05/2021 16:35 03/05/2021 20:52 03/06/2021 06:19 03/06/2021 11:37  Glucose-Capillary Latest Ref Range: 70 - 99 mg/dL 198 (H) 184 (H) 219 (H) 217 (H) 164 (H) 333 (H)   Review of Glycemic Control  Diabetes history: DM2 Outpatient Diabetes medications: Tresiba 100-150 units QAM, Novolog 0-30 units BID (1 hour after taking Tresiba in the morning and again 1 hour before bed (if needed)) Current orders for Inpatient glycemic control: Semglee 12 units daily, Novolog 0-15 units TID with meals, Novolog 0-5 units QHS  Inpatient Diabetes Program Recommendations:    Insulin: Please consider increasing Semglee to 15 units daily and ordering Novolog 4 units TID with meals for meal coverage if patient eats at least 50% of meals.  Thanks, Barnie Alderman, RN, MSN, CDE Diabetes Coordinator Inpatient Diabetes Program [phone] (Team Pager from 8am to 5pm)

## 2021-03-06 NOTE — Progress Notes (Signed)
Nutrition Follow-up  DOCUMENTATION CODES:   Not applicable  INTERVENTION:   - Magic cup TID with meals, each supplement provides 290 kcal and 9 grams of protein  - MVI with minerals daily  - Encourage PO intake  - d/c Ensure Max  NUTRITION DIAGNOSIS:   Inadequate oral intake related to acute illness (gastritis) as evidenced by per patient/family report.  Progressing  GOAL:   Patient will meet greater than or equal to 90% of their needs  Progressing  MONITOR:   PO intake, Supplement acceptance, Diet advancement, Weight trends, Labs, I & O's  REASON FOR ASSESSMENT:   Malnutrition Screening Tool    ASSESSMENT:   Pt with a PMH significant for diverticulosis and colon polyps, CAD w/ stenting, HTN, IDDM, Barrett esophagus, IBS, OSA, presented with persistent abdominal pain and diarrhea, dark-colored stool, and increasing shortness of breath. Admitted with symptomatic anemia.  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 Pt currently on a GI soft diet with 90-100% meal completions documented.  No updated weights since admission.  Spoke with pt and husband at bedside. Pt reports improved appetite today and that she has been eating much better today than previously. Pt consumed almost all of her lunch meal per her report. Pt reports that she does not like the Ensure Max shakes but is willing to try the Magic Cups with meals. RD to order. Discussed importance of adequate kcal and protein intake in healing and maintaining lean muscle mass.  Pt reports a UBW of 165 lbs. Pt believes that she has lost weight but is unsure how much. Her husband reports that her most recent weight was 155 lbs. Reviewed weight history in chart. Weight on 08/01/20 was 72.6 kg. Weight on admission was 70 kg. This is a 3.6% weight loss in 7 months which is not significant for timeframe.  Pt  reports difficulty swallowing at times and sensation of food getting stuck in her throat. Pt states that this makes her nauseous and makes her vomit. RN made aware. RN notes pt with difficulty swallowing this morning but was laying down in bed trying to eat breakfast. Pt reports that she has some narrowing in her esophagus. Per EGD op note on 8/14, pt with small esophageal varices but esophagus otherwise normal. Pt reports feelings of acid reflux and burning in her throat. Noted maalox ordered.  Pt reports that her diarrhea continues but she believes it may be improving some. Pt notes nausea today but no vomiting.  Discussed pt with RN who reports simethicone given recent due to abdominal distention and gas.  Meal Completion: 90-100%  Medications reviewed and include: maalox QID, bentyl TID, SSI, semglee 12 units daily, MVI with minerals, protonix, miralax, Ensure Max BID, IV abx  Labs reviewed: sodium 132, hemoglobin 8.4 CBG's: 164-333 x 24 hours  UOP: 350 ml x 12 hours I/O's: -3.9 L since admit  NUTRITION - FOCUSED PHYSICAL EXAM:  Flowsheet Row Most Recent Value  Orbital Region Mild depletion  Upper Arm Region Mild depletion  Thoracic and Lumbar Region No depletion  Buccal Region Mild depletion  Temple Region Mild depletion  Clavicle Bone Region Mild depletion  Clavicle and Acromion Bone Region Mild depletion  Scapular Bone Region No depletion  Dorsal Hand Mild depletion  Patellar Region Moderate depletion  Anterior Thigh Region Moderate depletion  Posterior Calf Region Moderate depletion  Edema (RD Assessment) Mild  Hair  Reviewed  Eyes Reviewed  Mouth Reviewed  Skin Reviewed  Nails Reviewed       Diet Order:   Diet Order             DIET SOFT Room service appropriate? Yes; Fluid consistency: Thin  Diet effective now                   EDUCATION NEEDS:   No education needs have been identified at this time  Skin:  Skin Assessment: Reviewed RN  Assessment  Last BM:  03/06/21 large type 5/type 6  Height:   Ht Readings from Last 1 Encounters:  03/01/21 '5\' 2"'$  (1.575 m)    Weight:   Wt Readings from Last 1 Encounters:  03/01/21 70 kg    BMI:  Body mass index is 28.23 kg/m.  Estimated Nutritional Needs:   Kcal:  [id number]  Protein:  85-100 grams  Fluid:  >1.75L/d    Gustavus Bryant, MS, RD, LDN Inpatient Clinical Dietitian Please see AMiON for contact information.

## 2021-03-06 NOTE — Progress Notes (Signed)
Pt endorsed urgent BM this morning. Pt also endorsed that half was brown but the other half appear to separate out to a black coffee ground appearance. Not bright blood indicated by patient.

## 2021-03-06 NOTE — Progress Notes (Signed)
Patient refused CPAP for the night. Patient instructed to call if she changed her mind.

## 2021-03-06 NOTE — Progress Notes (Signed)
[facility] Progress Note  Patient: [name]    [id number]  DOA: 03/01/2021     Date of Service: the patient was seen and examined on 03/06/2021  Brief hospital course: [name] is a 70 y.o. female with medical history significant of diverticulosis and colon polyps, CAD with stenting, HTN, IDDM,Barrett esophagus, IBS, OSA on CPAP presented with persistent abdominal pain and diarrhea, dark-colored stool, and increasing shortness of breath. -CT with question diverticulitis, she was also noted to have cirrhosis with portal hypertension,  -Gastroenterology consulted, EGD noted grade 1 varices, gastritis  Subjective: Complains of dyspnea this morning, has some abdominal discomfort still, having frequent stools on antibiotics  Assessment and Plan:  Iron deficiency anemia Melena Acute on chronic abdominal pain. -Transfused 2 units of PRBC this admission, gastroenterology consulted, EGD noted gastritis and grade 1 varices -Hemoglobin has been stable -GI recommended colonoscopy as outpatient -Continue dicyclomine -Clinical suspicion for active diverticulitis remains low, however will continue antibiotics today to complete 7-day course -Supportive care, increase activity as tolerated, diet advanced   New diagnosis of cirrhosis, portal hypertension as well as esophageal varices. Likely NASH -Mild ascites only, EGD with grade 1 varices -Outpatient gastroenterology follow-up   Possible diverticulitis -Treated with IV ceftriaxone and Flagyl, this was then switched to Unasyn by my partner on 8/16 -Abdominal exam is unremarkable, will advance diet, supportive care -Discontinue antibiotics after 7 days  Dyspnea/fluid overload -Likely iatrogenic from fluids, blood transfusion and cirrhosis -2D echo noted preserved EF, grade 2 diastolic dysfunction -IV Lasix x1 today   CAD Angina -Appreciate cardiology input, her angina was suspected to be associated with anemia -EKG  unremarkable, high-sensitivity troponin was negative -Cardiology recommended outpatient follow-up once her hemoglobin has stabilized -Continue Imdur   Mood disorder and anxiety Continue Xanax and Cymbalta.  Continue trazodone nightly.  Body mass index is 28.23 kg/m.  Nutrition Problem: Inadequate oral intake Etiology: acute illness (gastritis)     DVT Prophylaxis: Lovenox   Advance goals of care discussion: Pt is Full code.  Family Communication: no family was present at bedside, at the time of interview.    Disposition:  Status is: Inpatient  Remains inpatient appropriate because:IV treatments appropriate due to intensity of illness or inability to take PO  Dispo: The patient is from: Home              Anticipated d/c is to: Home              Patient currently is not medically stable to d/c.   Difficult to place patient No   Physical Exam:  General: Pleasant female sitting up in bed, slightly uncomfortable appearing, AAOx3 CVS: S1-S2, regular rate rhythm Lungs: Decreased breath sounds to bases otherwise clear Abdomen: Soft, mildly distended, nontender, bowel sounds present Extremities: No edema Skin: No rash on exposed skin Neuro: Moves all extremities, no localizing signs  Vitals:   03/06/21 0734 03/06/21 0800 03/06/21 0948 03/06/21 0959  BP:  (!) 141/50 (!) 141/45 (!) 141/45  Pulse: 69 75 69   Resp: 17 17    Temp:      TempSrc:      SpO2: 97% 96%    Weight:      Height:       Scheduled Meds:  alum & mag hydroxide-simeth  30 mL Oral QID   bisacodyl  10 mg Rectal Once   dicyclomine  10 mg Oral TID AC   diltiazem  120 mg Oral q morning   DULoxetine  60 mg Oral BID   insulin aspart  0-15 Units Subcutaneous TID WC   insulin aspart  0-5 Units Subcutaneous QHS   insulin glargine-yfgn  12 Units Subcutaneous Daily   isosorbide mononitrate  30 mg Oral Daily   losartan  50 mg Oral Daily   metoprolol tartrate  25 mg Oral BID   montelukast  10 mg Oral QHS    multivitamin with minerals  1 tablet Oral Daily   pantoprazole  40 mg Oral BID AC   polyethylene glycol  17 g Oral Daily   Ensure Max Protein  11 oz Oral BID   traZODone  200 mg Oral QHS   Continuous Infusions:  ampicillin-sulbactam (UNASYN) IV 3 g (03/05/21 1212)   PRN Meds: albuterol, ALPRAZolam, diphenhydrAMINE, hydrALAZINE, HYDROcodone-acetaminophen, metoprolol tartrate, morphine injection, ondansetron (ZOFRAN) IV   Time spent: 55mn  Author: PBerle Mull MD [facility] 03/06/2021 11:27 AM

## 2021-03-06 NOTE — Plan of Care (Signed)
  Problem: Clinical Measurements: Goal: Ability to maintain clinical measurements within normal limits will improve Outcome: Not Progressing Goal: Diagnostic test results will improve Outcome: Not Progressing   Problem: Elimination: Goal: Will not experience complications related to bowel motility Outcome: Not Progressing   Problem: Pain Managment: Goal: General experience of comfort will improve Outcome: Not Progressing

## 2021-03-07 ENCOUNTER — Inpatient Hospital Stay (HOSPITAL_COMMUNITY): Payer: Medicare HMO

## 2021-03-07 LAB — COMPREHENSIVE METABOLIC PANEL
ALT: 18 U/L (ref 0–44)
AST: 21 U/L (ref 15–41)
Albumin: 3 g/dL — ABNORMAL LOW (ref 3.5–5.0)
Alkaline Phosphatase: 78 U/L (ref 38–126)
Anion gap: 4 — ABNORMAL LOW (ref 5–15)
BUN: 7 mg/dL — ABNORMAL LOW (ref 8–23)
CO2: 30 mmol/L (ref 22–32)
Calcium: 8.4 mg/dL — ABNORMAL LOW (ref 8.9–10.3)
Chloride: 97 mmol/L — ABNORMAL LOW (ref 98–111)
Creatinine, Ser: 0.68 mg/dL (ref 0.44–1.00)
GFR, Estimated: >60 mL/min (ref >60)
Glucose, Bld: 266 mg/dL — ABNORMAL HIGH (ref 70–99)
Potassium: 3.8 mmol/L (ref 3.5–5.1)
Sodium: 131 mmol/L — ABNORMAL LOW (ref 135–145)
Total Bilirubin: 0.2 mg/dL — ABNORMAL LOW (ref 0.3–1.2)
Total Protein: 6 g/dL — ABNORMAL LOW (ref 6.5–8.1)

## 2021-03-07 LAB — CBC
HCT: 27.4 % — ABNORMAL LOW (ref 36.0–46.0)
Hemoglobin: 8.3 g/dL — ABNORMAL LOW (ref 12.0–15.0)
MCH: 25.5 pg — ABNORMAL LOW (ref 26.0–34.0)
MCHC: 30.3 g/dL (ref 30.0–36.0)
MCV: 84 fL (ref 80.0–100.0)
Platelets: 214 10*3/uL (ref 150–400)
RBC: 3.26 MIL/uL — ABNORMAL LOW (ref 3.87–5.11)
RDW: 18.5 % — ABNORMAL HIGH (ref 11.5–15.5)
WBC: 10.3 10*3/uL (ref 4.0–10.5)
nRBC: 0 % (ref 0.0–0.2)

## 2021-03-07 LAB — GLUCOSE, CAPILLARY
Glucose-Capillary: 218 mg/dL — ABNORMAL HIGH (ref 70–99)
Glucose-Capillary: 311 mg/dL — ABNORMAL HIGH (ref 70–99)
Glucose-Capillary: 291 mg/dL — ABNORMAL HIGH (ref 70–99)
Glucose-Capillary: 243 mg/dL — ABNORMAL HIGH (ref 70–99)

## 2021-03-07 MED ORDER — INSULIN GLARGINE-YFGN 100 UNIT/ML ~~LOC~~ SOLN
15.0000 [IU] | Freq: Every day | SUBCUTANEOUS | Status: DC
  Administered 2021-03-07 – 2021-03-08 (×2): 15 [IU] via SUBCUTANEOUS
  Filled 2021-03-07 (×2): qty 0.15

## 2021-03-07 MED ORDER — INSULIN ASPART 100 UNIT/ML IJ SOLN
3.0000 [IU] | Freq: Three times a day (TID) | INTRAMUSCULAR | Status: DC
  Administered 2021-03-07 – 2021-03-08 (×5): 3 [IU] via SUBCUTANEOUS

## 2021-03-07 MED ORDER — FUROSEMIDE 10 MG/ML IJ SOLN
40.0000 mg | Freq: Once | INTRAMUSCULAR | Status: AC
  Administered 2021-03-07: 40 mg via INTRAVENOUS
  Filled 2021-03-07: qty 4

## 2021-03-07 MED ORDER — POTASSIUM CHLORIDE CRYS ER 20 MEQ PO TBCR
20.0000 meq | EXTENDED_RELEASE_TABLET | Freq: Once | ORAL | Status: AC
  Administered 2021-03-07: 20 meq via ORAL
  Filled 2021-03-07: qty 1

## 2021-03-07 NOTE — Plan of Care (Signed)

## 2021-03-07 NOTE — TOC Transition Note (Signed)
Transition of Care [facility]) - CM/SW Discharge Note   Patient Details  Name: [name] MRN: [id number] Date of Birth: [birth year]  [facility] CM/SW Contact:  Verdell Carmine, RN Phone Number: 03/07/2021, 2:18 PM   Clinical Narrative:     Patient will DC with rolling walker and HH PT with [facility]. No other needs identified.   Final next level of care: [place] Barriers to Discharge: No Barriers Identified   Patient Goals and CMS Choice        Discharge Placement                   Home with Home Health    Discharge Plan and Services   Discharge Planning Services: CM Consult            DME Arranged: Walker rolling   Date DME Agency Contacted: 03/07/21 Time DME Agency Contacted: [phone] Representative spoke with at DME Agency: [place]: PT [place]: [place] Date [place]: 03/07/21 Time [place]: [phone] Representative spoke with at [place]: [place] ([place]) Interventions     Readmission Risk Interventions No flowsheet data found.

## 2021-03-07 NOTE — Progress Notes (Signed)
Inpatient Diabetes Program Recommendations  AACE/ADA: New Consensus Statement on Inpatient Glycemic Control  Target Ranges:  Prepandial:   less than 140 mg/dL      Peak postprandial:   less than 180 mg/dL (1-2 hours)      Critically ill patients:  140 - 180 mg/dL   Results for ENZLEE, URSUA (MRN [id number]) as of 03/07/2021 10:50  Ref. Range 03/06/2021 06:19 03/06/2021 11:37 03/06/2021 16:41 03/06/2021 21:28 03/07/2021 06:01  Glucose-Capillary Latest Ref Range: 70 - 99 mg/dL 164 (H) 333 (H) 174 (H) 336 (H) 218 (H)    Review of Glycemic Control  Diabetes history: DM2 Outpatient Diabetes medications: Tresiba 100-150 units QAM, Novolog 0-30 units BID (1 hour after taking Tresiba in the morning and again 1 hour before bed (if needed)) Current orders for Inpatient glycemic control: Semglee 15 units daily, Novolog 0-15 units TID with meals, Novolog 0-5 units QHS, Novolog 3 units TID with meals  Inpatient Diabetes Program Recommendations:    Insulin: Please consider increasing Semglee to 18 units daily and meal coverage to Novolog 5 units TID with meals.  Thanks, Barnie Alderman, RN, MSN, CDE Diabetes Coordinator Inpatient Diabetes Program [phone] (Team Pager from 8am to 5pm)

## 2021-03-07 NOTE — Progress Notes (Signed)
Patient refuses CPAP tonight. Patient placed on 2 Lpm nasal cannula due to SATs of 88%. Vitals stable at this time.

## 2021-03-07 NOTE — Progress Notes (Signed)
[facility] Progress Note  Patient: [name]    [id number]  DOA: 03/01/2021     Date of Service: the patient was seen and examined on 03/07/2021  Brief hospital course: DUJUANA TRAIN is a 70 y.o. female with medical history significant of diverticulosis and colon polyps, CAD with stenting, HTN, IDDM,Barrett esophagus, IBS, OSA on CPAP presented with persistent abdominal pain and diarrhea, dark-colored stool, and increasing shortness of breath. -CT with question diverticulitis, she was also noted to have cirrhosis with portal hypertension,  -Gastroenterology consulted, EGD noted grade 1 varices, gastritis  Subjective: Continues to have some dyspnea, abdominal discomfort, frequent stools on antibiotics, vomited this morning  Assessment and Plan:  Iron deficiency anemia Melena Acute on chronic abdominal pain. -Transfused 2 units of PRBC this admission, gastroenterology consulted, EGD noted gastritis and grade 1 varices -Hemoglobin has been stable -GI recommended colonoscopy as outpatient -Continue dicyclomine -Clinical suspicion for active diverticulitis remains low, however will continue antibiotics today to complete 7-day course -Will check abdominal ultrasound, wonder if she is developing symptomatic ascites   New diagnosis of cirrhosis, portal hypertension as well as esophageal varices. Likely NASH -Mild ascites on initial imaging, EGD with grade 1 varices -Outpatient gastroenterology follow-up -Repeat ultrasound today   Possible diverticulitis -Treated with IV ceftriaxone and Flagyl, this was then switched to Unasyn by my partner on 8/16 -Abdominal exam is unremarkable, will advance diet, supportive care -Discontinue antibiotics after today's dose  Dyspnea/fluid overload -Likely iatrogenic from fluids, blood transfusion and cirrhosis -2D echo noted preserved EF, grade 2 diastolic dysfunction -Repeat IV Lasix today   CAD Angina -Appreciate cardiology input,  her angina was suspected to be associated with anemia -EKG unremarkable, high-sensitivity troponin was negative -Cardiology recommended outpatient follow-up once her hemoglobin has stabilized -Continue Imdur   Mood disorder and anxiety Continue Xanax and Cymbalta.  Continue trazodone nightly.  Body mass index is 28.23 kg/m.  Nutrition Problem: Inadequate oral intake Etiology: acute illness (gastritis)     DVT Prophylaxis: Lovenox   Advance goals of care discussion: Pt is Full code.  Family Communication: no family was present at bedside, at the time of interview.    Disposition:  Status is: Inpatient  Remains inpatient appropriate because:IV treatments appropriate due to intensity of illness or inability to take PO  Dispo: The patient is from: Home              Anticipated d/c is to: Home              Patient currently is not medically stable to d/c.   Difficult to place patient No   Physical Exam:  General: Pleasant middle-aged female sitting up in bed, uncomfortable appearing, AAOx3, no distress CVS: S1-S2, regular rate rhythm Lungs: Decreased breath sounds to bases Abdomen: Soft, mildly distended, nontender, bowel sounds present Extremities: No edema Skin: No rash on exposed skin Neuro: Moves all extremities, no localizing signs  Vitals:   03/06/21 1943 03/06/21 2349 03/07/21 0559 03/07/21 0739  BP: (!) 142/66 (!) 142/50 (!) 126/49 (!) 125/48  Pulse: 66 74 60 60  Resp: '20 20 15 19  '$ Temp: 98.3 F (36.8 C) 98.1 F (36.7 C) 98.3 F (36.8 C) 98.1 F (36.7 C)  TempSrc: Oral Oral Oral Oral  SpO2: 93% 93% 92% 93%  Weight:      Height:       Scheduled Meds:  alum & mag hydroxide-simeth  30 mL Oral QID   bisacodyl  10 mg Rectal Once  dicyclomine  10 mg Oral TID AC   diltiazem  120 mg Oral q morning   DULoxetine  60 mg Oral BID   insulin aspart  0-15 Units Subcutaneous TID WC   insulin aspart  0-5 Units Subcutaneous QHS   insulin glargine-yfgn  12 Units  Subcutaneous Daily   isosorbide mononitrate  30 mg Oral Daily   losartan  50 mg Oral Daily   metoprolol tartrate  25 mg Oral BID   montelukast  10 mg Oral QHS   multivitamin with minerals  1 tablet Oral Daily   pantoprazole  40 mg Oral BID AC   polyethylene glycol  17 g Oral Daily   Ensure Max Protein  11 oz Oral BID   traZODone  200 mg Oral QHS   Continuous Infusions:  ampicillin-sulbactam (UNASYN) IV 3 g (03/05/21 1212)   PRN Meds: albuterol, ALPRAZolam, diphenhydrAMINE, hydrALAZINE, HYDROcodone-acetaminophen, metoprolol tartrate, morphine injection, ondansetron (ZOFRAN) IV   Time spent: 20mn  PDomenic PoliteMD [facility] 03/07/2021 11:23 AM

## 2021-03-07 NOTE — Progress Notes (Signed)
Physical Therapy Treatment Patient Details Name: [name] MRN: [id number] DOB: 03-03-1951 Today's Date: 03/07/2021    History of Present Illness 70 yo female admitted 8/13 with SOB, diarrhea, dark stool, abd pain and found to have gastritis, anemia with transfusion done, esophageal varices, new cirrhosis discovered, and susp diverticulitis. PMHx: colon polyps, diverticulosis, CAD with stent, HTN, DM, Barrett esophagus, IBS, OSA on CPAP, angina, mood disorder with anxiety.    PT Comments    Pt received up with spouse Supervision standing at [facility] preparing to walk in hallway, agreeable to therapy session and with good participation and tolerance for household distance gait trial. Pt reporting decreased Lt heel pain this date (3/10) and SpO2 WFL on RA. Pt with good safety awareness with RW use and new RW observed in room and fitted for proper height. Pt continues to benefit from PT services to progress toward functional mobility goals.   Follow Up Recommendations  Home health PT     Equipment Recommendations  Rolling walker with 5" wheels    Recommendations for Other Services       Precautions / Restrictions Precautions Precautions: Fall Precaution Comments: monitor HR/SpO2 Restrictions Weight Bearing Restrictions: No    Mobility  Bed Mobility               General bed mobility comments: pt remained up in chair    Transfers Overall transfer level: Needs assistance Equipment used: Rolling walker (2 wheeled) Transfers: Sit to/from Stand Sit to Stand: Min guard         General transfer comment: pt walking into hallway with RW support and spouse as PTA met up with her, needs min guard to sit in chair with fair safety awareness.  Ambulation/Gait Ambulation/Gait assistance: Min guard Gait Distance (Feet): 170 Feet Assistive device: Rolling walker (2 wheeled) Gait Pattern/deviations: Decreased stride length;Step-through pattern;Decreased weight shift to left Gait  velocity: Decreased   General Gait Details: Decreased reliance on RW today. Min guard for safety. Fair speed and cadence, VSS on RA 93-96% with exertion   Stairs             Wheelchair Mobility    Modified Rankin (Stroke Patients Only)       Balance Overall balance assessment: Needs assistance Sitting-balance support: Feet supported;No upper extremity supported Sitting balance-Leahy Scale: Fair     Standing balance support: Bilateral upper extremity supported;During functional activity Standing balance-Leahy Scale: Poor Standing balance comment: Requires BUE support.                            Cognition Arousal/Alertness: Awake/alert Behavior During Therapy: WFL for tasks assessed/performed Overall Cognitive Status: Within Functional Limits for tasks assessed                                 General Comments: motivated to progress mobility this date now that pain is a bit better      Exercises      General Comments General comments (skin integrity, edema, etc.): HR 60's bpm with exertion and SpO2 WNL on RA      Pertinent Vitals/Pain Pain Assessment: 0-10 Pain Score: 3  Pain Location: L heel Pain Descriptors / Indicators: Discomfort;Sore Pain Intervention(s): Monitored during session;Repositioned    Home Living                      Prior Function  PT Goals (current goals can now be found in the care plan section) Acute Rehab PT Goals Patient Stated Goal: to get home and have pain gone PT Goal Formulation: With patient Time For Goal Achievement: 03/11/21 Progress towards PT goals: Progressing toward goals    Frequency    Min 3X/week      PT Plan Current plan remains appropriate    Co-evaluation              AM-PAC PT "6 Clicks" Mobility   Outcome Measure  Help needed turning from your back to your side while in a flat bed without using bedrails?: None Help needed moving from lying on your  back to sitting on the side of a flat bed without using bedrails?: None Help needed moving to and from a bed to a chair (including a wheelchair)?: A Little Help needed standing up from a chair using your arms (e.g., wheelchair or bedside chair)?: A Little Help needed to walk in hospital room?: A Little Help needed climbing 3-5 steps with a railing? : A Little 6 Click Score: 20    End of Session   Activity Tolerance: Patient tolerated treatment well Patient left: in chair;with call bell/phone within reach;with family/visitor present (per RN OK to leave chair alarm off as she sat in chair before staff noticed pad hanging on wall next to chair but spouse/son to let RN know when they are leaving so staff can assist her back to bed) Nurse Communication: Mobility status;Other (comment) (chair alarm not on, family x2 in room and pt only needs +1 assist) PT Visit Diagnosis: Unsteadiness on feet (R26.81);Muscle weakness (generalized) (M62.81);Other abnormalities of gait and mobility (R26.89) Pain - Right/Left: Left Pain - part of body: Ankle and joints of foot     Time: 1610-9604 PT Time Calculation (min) (ACUTE ONLY): 10 min  Charges:  $Gait Training: 8-22 mins                     Lakeesha Fontanilla P., PTA Acute Rehabilitation Services Pager: [phone] Office: [place] 03/07/2021, 4:26 PM

## 2021-03-07 NOTE — Plan of Care (Signed)
  Problem: Education: Goal: Knowledge of General Education information will improve Description: Including pain rating scale, medication(s)/side effects and non-pharmacologic comfort measures Outcome: Progressing   Problem: Clinical Measurements: Goal: Diagnostic test results will improve Outcome: Progressing   Problem: Activity: Goal: Risk for activity intolerance will decrease Outcome: Progressing   Problem: Pain Managment: Goal: General experience of comfort will improve Outcome: Progressing   

## 2021-03-07 NOTE — TOC Initial Note (Signed)
Transition of Care [facility]) - Initial/Assessment Note    Patient Details  Name: [name] MRN: [id number] Date of Birth: 1950/11/22  Transition of Care [facility] CM/SW Contact:    Verdell Carmine, RN Phone Number: 03/07/2021, 9:39 AM  Clinical Narrative:                  [name], recommended for [facility] PT.Discussed with patient about different agencies, she stated she wants one that works with insurance/ no preference. Contacted Angela from [facility] to see if they go to [place]. CM will follow for needs, Rolling walker ordered, will follow for needs and transitions.  Expected Discharge Plan: [place] Barriers to Discharge: Continued Medical Work up   Patient Goals and CMS Choice        Expected Discharge Plan and Services Expected Discharge Plan: [place]   Discharge Planning Services: CM Consult   Living arrangements for the past 2 months: Single Family Home                           HH Arranged: PT HH Agency: [place] Date [facility] Agency Contacted: 03/07/21 Time HH Agency Contacted: [phone] Representative spoke with at [place]: [place] Arrangements/Services Living arrangements for the past 2 months: [place] with:: Spouse Patient language and need for interpreter reviewed:: Yes Do you feel safe going back to the place where you live?: Yes      Need for Family Participation in Patient Care: Yes (Comment) Care giver support system in place?: Yes (comment)   Criminal Activity/Legal Involvement Pertinent to Current Situation/Hospitalization: No - Comment as needed  Activities of Daily Living Home Assistive Devices/Equipment: BIPAP ADL Screening (condition at time of admission) Patient's cognitive ability adequate to safely complete daily activities?: Yes Is the patient deaf or have difficulty hearing?: No Does the patient have difficulty seeing, even when wearing glasses/contacts?:  No Does the patient have difficulty concentrating, remembering, or making decisions?: No Patient able to express need for assistance with ADLs?: Yes Does the patient have difficulty dressing or bathing?: No Independently performs ADLs?: Yes (appropriate for developmental age) Does the patient have difficulty walking or climbing stairs?: Yes (SOB with exertion, CP) Weakness of Legs: Both Weakness of Arms/Hands: None  Permission Sought/Granted                  Emotional Assessment   Attitude/Demeanor/Rapport: Gracious Affect (typically observed): Appropriate Orientation: : Oriented to Self, Oriented to Place, Oriented to  Time, Oriented to Situation Alcohol / Substance Use: Not Applicable Psych Involvement: No (comment)  Admission diagnosis:  GI bleed [K92.2] Ascites [R18.8] Symptomatic anemia [D64.9] Patient Active Problem List   Diagnosis Date Noted   Diverticulitis of colon    Hepatic cirrhosis ([place])    Symptomatic anemia    Iron deficiency anemia due to chronic blood loss    Gastritis and gastroduodenitis    Secondary esophageal varices without bleeding (HCC)    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   GI bleed 03/01/2021   Ascites    Melena    Microcytic anemia    Heartburn    Non-cardiac chest pain    Unstable angina ([place]) 12/28/2017   Dysphagia    IBS (irritable bowel syndrome) 10/18/2015   Diarrhea 10/11/2014   Esophageal stricture 10/11/2014   History of colonic polyps 10/11/2014   Rectal bleeding 10/11/2014   Type 2  diabetes mellitus ([place]) 12/27/2012   Coronary atherosclerosis of native coronary artery 12/10/2012   Mixed hyperlipidemia 05/24/2007   Obstructive sleep apnea 05/24/2007   Essential hypertension, benign 05/24/2007   PCP:  Caryl Bis, MD Pharmacy:   [facility]  ([place], [place] [address] Phone: [phone] Fax:  [phone]  [facility] [place], [place] - [address] [place] [address] Phone: [phone] Fax: [phone]     Social Determinants of Health (SDOH) Interventions    Readmission Risk Interventions No flowsheet data found.

## 2021-03-08 LAB — COMPREHENSIVE METABOLIC PANEL
ALT: 15 U/L (ref 0–44)
AST: 20 U/L (ref 15–41)
Albumin: 3 g/dL — ABNORMAL LOW (ref 3.5–5.0)
Alkaline Phosphatase: 69 U/L (ref 38–126)
Anion gap: 7 (ref 5–15)
BUN: 9 mg/dL (ref 8–23)
CO2: 27 mmol/L (ref 22–32)
Calcium: 8.3 mg/dL — ABNORMAL LOW (ref 8.9–10.3)
Chloride: 97 mmol/L — ABNORMAL LOW (ref 98–111)
Creatinine, Ser: 0.94 mg/dL (ref 0.44–1.00)
GFR, Estimated: >60 mL/min (ref >60)
Glucose, Bld: 188 mg/dL — ABNORMAL HIGH (ref 70–99)
Potassium: 4.1 mmol/L (ref 3.5–5.1)
Sodium: 131 mmol/L — ABNORMAL LOW (ref 135–145)
Total Bilirubin: 0.4 mg/dL (ref 0.3–1.2)
Total Protein: 5.8 g/dL — ABNORMAL LOW (ref 6.5–8.1)

## 2021-03-08 LAB — CBC
HCT: 26.7 % — ABNORMAL LOW (ref 36.0–46.0)
Hemoglobin: 7.9 g/dL — ABNORMAL LOW (ref 12.0–15.0)
MCH: 25 pg — ABNORMAL LOW (ref 26.0–34.0)
MCHC: 29.6 g/dL — ABNORMAL LOW (ref 30.0–36.0)
MCV: 84.5 fL (ref 80.0–100.0)
Platelets: 214 10*3/uL (ref 150–400)
RBC: 3.16 MIL/uL — ABNORMAL LOW (ref 3.87–5.11)
RDW: 18.6 % — ABNORMAL HIGH (ref 11.5–15.5)
WBC: 10.4 10*3/uL (ref 4.0–10.5)
nRBC: 0 % (ref 0.0–0.2)

## 2021-03-08 LAB — GLUCOSE, CAPILLARY
Glucose-Capillary: 160 mg/dL — ABNORMAL HIGH (ref 70–99)
Glucose-Capillary: 239 mg/dL — ABNORMAL HIGH (ref 70–99)

## 2021-03-08 MED ORDER — TRESIBA FLEXTOUCH 200 UNIT/ML ~~LOC~~ SOPN: 40.0000 [IU] | PEN_INJECTOR | Freq: Every day | SUBCUTANEOUS | Status: DC

## 2021-03-08 MED ORDER — DICYCLOMINE HCL 10 MG PO CAPS: 10.0000 mg | ORAL_CAPSULE | Freq: Three times a day (TID) | ORAL | 0 refills | Status: DC | PRN

## 2021-03-08 NOTE — Discharge Summary (Signed)
Physician Discharge Summary  [name] [id number] DOB: 1950/07/22 DOA: 03/01/2021  PCP: Caryl Bis, MD  Admit date: 03/01/2021 Discharge date: 03/08/2021  Time spent: 35 minutes  Recommendations for Outpatient Follow-up:  Gastroenterology Dr. Wilfrid Lund in 2 to 3 weeks PCP 1 week Cardiology Dr. 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 female with medical history significant of diverticulosis and colon polyps, CAD with stenting, HTN, IDDM,Barrett esophagus, IBS, OSA on CPAP presented with persistent abdominal pain and diarrhea, dark-colored stool, and increasing shortness of breath. -CT with question diverticulitis, she was also noted to have cirrhosis with portal hypertension,  -Gastroenterology consulted, EGD noted grade 1 varices, gastritis  Hospital Course:   Iron deficiency anemia Melena Acute on chronic abdominal pain. -Transfused 2 units of PRBC this admission, gastroenterology consulted, EGD noted gastritis and grade 1 varices -Hemoglobin has been stable -GI recommended colonoscopy as outpatient -Continue dicyclomine -Clinical suspicion for active diverticulitis remains low, however continued antibiotics to complete 7-day course   New diagnosis of cirrhosis,  portal hypertension as well as esophageal varices-Noted on CT Likely NASH -mild ascites on initial imaging, EGD  with grade 1 varices -Outpatient gastroenterology follow-up   Possible diverticulitis -CT reported faint pericolonic edema in the LLQ adjacent to the proximal sigmoid colon, may represent diverticulitis, though discretely inflamed diverticulum is not seen -Treated with IV ceftriaxone and Flagyl, this was then switched to Unasyn by my partner on 8/16 -Abdominal exam is unremarkable, diet advanced -Treated with 7 days of antibiotics and then discontinued   Dyspnea/fluid overload -Likely iatrogenic from fluids, blood transfusion and cirrhosis -2D echo noted preserved EF, grade 2 diastolic dysfunction -Given a dose of Lasix this admission, -Resumed Hyzaar/HCTZ at discharge   CAD Angina -Appreciate cardiology input, her angina was suspected to be associated with anemia -EKG unremarkable, high-sensitivity troponin was negative -Cardiology recommended outpatient follow-up once her hemoglobin has stabilized -Continue Imdur    Mood disorder and anxiety Continue Xanax and Cymbalta.  Continue trazodone nightly.       Discharge Exam: Vitals:   03/08/21 0804 03/08/21 1040  BP: (!) 125/94   Pulse: 60 72  Resp: 15   Temp: 98.1 F (36.7 C)   SpO2: 96%     General: AAOx3 Cardiovascular: S1S2/RRR Respiratory: CTAB  Discharge Instructions   Discharge Instructions     Diet - low sodium heart healthy   Complete by: As directed    Increase activity slowly   Complete by: As directed       Allergies as of 03/08/2021       Reactions   Erythromycin Base Diarrhea   Azithromycin Other (See Comments)   Upset stomach   Pollen Extract Cough   Runny nose   Remeron [mirtazapine] Other (See Comments)   Makes her too sleepy   Zolpidem Tartrate Other (See Comments)   amnesia   Other Rash, Other (See Comments)   Grass, dust,  green beans, corn, cabbage Reaction: upset stomach   Sulfonamide Derivatives Rash   Mouth sores   Tree Extract Rash        Medication List     TAKE these  medications    albuterol 108 (90 Base) MCG/ACT inhaler Commonly known as: VENTOLIN HFA Inhale 2 puffs into the lungs every 4 (four) hours as needed.   ALPRAZolam 1 MG tablet Commonly known as: XANAX Take 1 mg by mouth at bedtime.   aspirin EC 81 MG tablet Take 81 mg by mouth daily.   atorvastatin 20 MG tablet Commonly known as: LIPITOR Take 20 mg by mouth daily.   Cartia XT 120 MG 24 hr capsule Generic drug: diltiazem TAKE ONE CAPSULE BY MOUTH EVERY MORNING What changed:  how much to take when to take this   cetirizine 10 MG tablet Commonly known as: ZYRTEC Take 10 mg by mouth daily as needed for allergies.   dicyclomine 10 MG capsule Commonly known as: BENTYL Take 1 capsule (10 mg total) by mouth 3 (three) times daily as needed for spasms. TAKE ONE CAPSULE BY MOUTH TWICE DAILY 20-30 MINUTES BEFORE BREAKFAST AND DINNER What changed:  how much to take how to take this when to take this reasons to take this   DULoxetine 60 MG capsule Commonly known as: CYMBALTA Take 60 mg by mouth 2 (two) times daily.   FeroSul 325 (65 FE) MG tablet Generic drug: ferrous sulfate Take 325 mg by mouth 2 (two) times daily.   HYDROcodone-acetaminophen 5-325 MG tablet Commonly known as: NORCO/VICODIN Take 1 tablet by mouth 2 (two) times daily.   isosorbide mononitrate 30 MG 24 hr tablet Commonly known as: IMDUR TAKE 1/2 TABLET BY MOUTH ONCE DAILY   losartan-hydrochlorothiazide 100-25 MG tablet Commonly known as: HYZAAR Take 1 tablet by mouth daily.   metoprolol tartrate 25 MG tablet Commonly known as: LOPRESSOR TAKE ONE TABLET BY MOUTH EVERY MORNING and TAKE ONE TABLET BY MOUTH EVERYDAY AT BEDTIME   montelukast 10 MG tablet Commonly known as: SINGULAIR Take 10 mg by mouth at bedtime.   nitroGLYCERIN 0.4 MG SL tablet Commonly known as: NITROSTAT Place 0.4 mg under the tongue every 5 (five) minutes x 3 doses as needed (if no relief after 3rd dose, proceed to the ED for an  evaluation or call 911).   NovoLOG FlexPen 100 UNIT/ML FlexPen Generic drug: insulin aspart Inject 0-30 Units into the skin in the morning and at bedtime.   omeprazole 40 MG capsule Commonly known as: PRILOSEC TAKE ONE CAPSULE BY MOUTH twice A DAY What changed: when to take this   promethazine 25 MG tablet Commonly known as: PHENERGAN Take 25 mg by mouth at bedtime. Takes prn and at times does 1/2 tablet instead of whole one   traZODone 50 MG tablet Commonly known as: DESYREL Take 100-150 mg by mouth at bedtime. 4 tabs hs   Tresiba FlexTouch 200 UNIT/ML FlexTouch Pen Generic drug: insulin degludec Inject 40 Units into the skin daily. As directed What changed:  how much to take when to take this reasons to take this               Durable Medical Equipment  (From admission, onward)           Start     Ordered   03/07/21 0942  For home use only DME Walker rolling  Once       Question Answer Comment  Walker: With [place]  Patient needs a walker to treat with the following condition Weakness      03/07/21 0942           Allergies  Allergen Reactions   Erythromycin Base Diarrhea   Azithromycin Other (See Comments)    Upset stomach    Pollen Extract Cough    Runny nose   Remeron [Mirtazapine] Other (See Comments)    Makes her too sleepy   Zolpidem Tartrate Other (See Comments)    amnesia   Other Rash and Other (See Comments)    Grass, dust, green beans, corn, cabbage Reaction: upset stomach   Sulfonamide Derivatives Rash    Mouth sores   Tree Extract Rash    Follow-up Information     Danis, Kirke Corin, MD Follow up.   Specialty: Gastroenterology Why: office will call w fup appt. Contact information: [address] [place] [address] [phone]         [place], [place] Follow up.   Specialty: [place] Why: you hh agency for physical therapy Contact information: [place] [address] [place], [place] [facility] Follow up.   Why: your Medical equipment provider Contact information: [address] [place] [address] [phone]                  The results of significant diagnostics from this hospitalization (including imaging, microbiology, ancillary and laboratory) are listed below for reference.    Significant Diagnostic Studies: DG Chest 2 View  Result Date: 03/01/2021 CLINICAL DATA:  Chest pain, shortness of breath. EXAM: CHEST - 2 VIEW COMPARISON:  Chest x-rays dated 02/26/2021 and 05/02/2014. FINDINGS: Heart size and mediastinal contours are stable. Coarse interstitial markings bilaterally, perihilar and lower lobe predominant. No confluent opacity to suggest a superimposed pneumonia. No pleural effusion or pneumothorax is seen. No acute-appearing osseous abnormality. Aortic atherosclerosis. IMPRESSION: 1. No active cardiopulmonary disease. No evidence of pneumonia or pulmonary edema. 2. Coarse interstitial lung markings bilaterally, perihilar and lower lobe predominant, suggesting chronic bronchitic change and/or chronic interstitial lung disease. 3. Aortic atherosclerosis. Electronically Signed   By: Franki Cabot M.D.   On: 03/01/2021 12:34   CT ABDOMEN PELVIS W CONTRAST  Result Date: 03/01/2021 CLINICAL DATA:  Provided history of: Abdominal pain, acute (Ped 0-18y) EXAM: CT ABDOMEN AND PELVIS WITH CONTRAST TECHNIQUE: Multidetector CT imaging of the abdomen and pelvis was performed using the standard protocol following bolus administration of intravenous contrast. CONTRAST:  64m OMNIPAQUE IOHEXOL 350 MG/ML SOLN COMPARISON:  CT 04/19/2019 FINDINGS: Lower chest: Linear scarring or atelectasis with slight improvement from prior exam. Slight left pleural thickening without significant effusion. No acute airspace disease. Wall thickening of the distal esophagus is again seen. Paraesophageal varices.  Hepatobiliary: Hepatic cirrhosis with heterogeneous decreased hepatic density a nodular contours. In previous geographic area of decreased density in the right lobe is less prominent than on prior exam. Hypertrophy of the right lobe with generalized atrophy of the left lobe. Cholecystectomy. Common bile duct measures 12 mm, previously 10 mm. No visualized choledocholithiasis Pancreas: No ductal dilatation or inflammation. Spleen: Normal in size without focal abnormality. Adrenals/Urinary Tract: Normal adrenal glands. Hydronephrosis or perinephric edema. Slight extrarenal pelvis configuration of both kidneys. There is homogeneous renal enhancement. Subcentimeter hypodensity in the lower left kidney is too small to characterize but likely cyst. Unremarkable urinary bladder. No bladder wall thickening. No intravesicular air.  Stomach/Bowel: Distal esophageal wall thickening with distal esophageal varices. Stomach is nondistended. There is a periampullary duodenal diverticulum without inflammation. Mild fecalization of small bowel contents without obstruction or inflammation. History of appendectomy. Distal descending and sigmoid colonic diverticulosis. There is colonic wall thickening of the sigmoid colon that appears similar to prior exam. Faint pericolonic edema in the left lower quadrant adjacent to the proximal sigmoid colon, series 3, image 71, may represent diverticulitis, though discretely inflamed diverticulum is not seen. Vascular/Lymphatic: Aortic atherosclerosis. No aortic aneurysm. Patent portal vein. Circumaortic left renal vein. No abdominopelvic adenopathy. Reproductive: Hysterectomy.  Quiescent appearance of the ovaries. Other: Trace perihepatic fluid/ascites, series 3, image 21. No free air or focal fluid collection. Induration of the lower anterior abdominal wall typical of medication injection sites, and seen on prior exam. Musculoskeletal: There are no acute or suspicious osseous abnormalities.  IMPRESSION: 1. Faint pericolonic edema in the left lower quadrant adjacent to the proximal sigmoid colon, may represent diverticulitis, though discretely inflamed diverticulum is not seen. No perforation or abscess. 2. Hepatic cirrhosis. Paraesophageal varices. Trace perihepatic fluid/ascites. 3. Distal esophageal wall thickening with distal esophageal varices, unchanged from prior exam. 4. Cholecystectomy with mild common bile duct dilatation, likely related to prior cholecystectomy. No visualized choledocholithiasis. Aortic Atherosclerosis (ICD10-I70.0). Electronically Signed   By: Keith Rake M.D.   On: 03/01/2021 16:56   US Abdomen Limited  Result Date: 03/07/2021 CLINICAL DATA:  Abdominal pain.  Prior cholecystectomy. EXAM: ULTRASOUND ABDOMEN LIMITED RIGHT UPPER QUADRANT COMPARISON:  None FINDINGS: Gallbladder: Surgically absent. Common bile duct: Diameter: 7 mm, within normal limits post cholecystectomy. Liver: No focal lesion identified. Heterogeneous and mildly increased parenchymal echogenicity is seen, consistent with hepatic steatosis. Portal vein is patent on color Doppler imaging with normal direction of blood flow towards the liver. Other: None. IMPRESSION: Prior cholecystectomy. No evidence of biliary ductal dilatation. Hepatic steatosis. Electronically Signed   By: Marlaine Hind M.D.   On: 03/07/2021 13:41   DG CHEST PORT 1 VIEW  Result Date: 03/03/2021 CLINICAL DATA:  Shortness of breath, history chronic bronchitis, coronary artery disease post MI, GERD, type II diabetes mellitus former smoker EXAM: PORTABLE CHEST 1 VIEW COMPARISON:  Portable exam 1241 hours compared to 03/01/2021 FINDINGS: Normal heart size, mediastinal contours, and pulmonary vascularity. Atherosclerotic calcification aorta. Chronic peribronchial thickening without pulmonary infiltrate, pleural effusion, or pneumothorax. Bones unremarkable. IMPRESSION: Chronic bronchitic changes without infiltrate. Aortic  Atherosclerosis (ICD10-I70.0). Electronically Signed   By: Lavonia Dana M.D.   On: 03/03/2021 14:15   DG Foot 2 Views Left  Result Date: 03/06/2021 CLINICAL DATA:  Heel pain.  No known injury. EXAM: LEFT FOOT - 2 VIEW COMPARISON:  None. FINDINGS: Prominent hallux valgus with probable prior bunionectomy. Mild osteoarthritis of the first metatarsal phalangeal joint. Hammertoe deformity of the second toe which limits assessment. No evidence of acute fracture. No erosion or bone destruction. There is a small plantar calcaneal spur. Mild dorsal soft tissue edema. IMPRESSION: 1. Small plantar calcaneal spur. 2. Prominent hallux valgus with probable prior bunionectomy. Mild osteoarthritis of the first metatarsophalangeal joint. Hammertoe deformity of the second toe which limits assessment. Electronically Signed   By: Keith Rake M.D.   On: 03/06/2021 15:41   ECHOCARDIOGRAM COMPLETE  Result Date: 03/04/2021    ECHOCARDIOGRAM REPORT   Patient Name:   BRYANT MCNEELEY Date of Exam: 03/04/2021 Medical Rec #:  [id number]     Height:       62.0 in Accession #:    [id number]  Weight:       154.3 lb Date of Birth:  11-24-1950      BSA:          1.712 m Patient Age:    70 years      BP:           134/59 mmHg Patient Gender: F             HR:           72 bpm. Exam Location:  Inpatient Procedure: 2D Echo, Cardiac Doppler and Color Doppler Indications:    Chest pain  History:        Patient has prior history of Echocardiogram examinations, most                 recent 12/28/2017. CAD; Risk Factors:Former Smoker, Hypertension,                 Diabetes and Sleep Apnea. S/P PCI. DOE.  Sonographer:    Clayton Lefort RDCS (AE) Referring Phys: [id number] Darreld Mclean  Sonographer Comments: Suboptimal subcostal window. IMPRESSIONS  1. Left ventricular ejection fraction, by estimation, is 60 to 65%. The left ventricle has normal function. The left ventricle has no regional wall motion abnormalities. There is mild left ventricular  hypertrophy. Left ventricular diastolic parameters are consistent with Grade II diastolic dysfunction (pseudonormalization). Elevated left ventricular end-diastolic pressure.  2. Right ventricular systolic function is normal. The right ventricular size is normal.  3. The mitral valve is abnormal. Mild mitral valve regurgitation. No evidence of mitral stenosis.  4. The aortic valve is tricuspid. There is moderate calcification of the aortic valve. There is moderate thickening of the aortic valve. Aortic valve regurgitation is not visualized. Mild aortic valve stenosis.  5. The inferior vena cava is normal in size with greater than 50% respiratory variability, suggesting right atrial pressure of 3 mmHg. FINDINGS  Left Ventricle: Left ventricular ejection fraction, by estimation, is 60 to 65%. The left ventricle has normal function. The left ventricle has no regional wall motion abnormalities. The left ventricular internal cavity size was normal in size. There is  mild left ventricular hypertrophy. Left ventricular diastolic parameters are consistent with Grade II diastolic dysfunction (pseudonormalization). Elevated left ventricular end-diastolic pressure. Right Ventricle: The right ventricular size is normal. No increase in right ventricular wall thickness. Right ventricular systolic function is normal. Left Atrium: Left atrial size was normal in size. Right Atrium: Right atrial size was normal in size. Pericardium: There is no evidence of pericardial effusion. Mitral Valve: The mitral valve is abnormal. There is mild thickening of the mitral valve leaflet(s). There is mild calcification of the mitral valve leaflet(s). Mild mitral annular calcification. Mild mitral valve regurgitation. No evidence of mitral valve stenosis. MV peak gradient, 7.1 mmHg. The mean mitral valve gradient is 3.0 mmHg. Tricuspid Valve: The tricuspid valve is normal in structure. Tricuspid valve regurgitation is not demonstrated. No evidence  of tricuspid stenosis. Aortic Valve: The aortic valve is tricuspid. There is moderate calcification of the aortic valve. There is moderate thickening of the aortic valve. Aortic valve regurgitation is not visualized. Mild aortic stenosis is present. Aortic valve mean gradient measures 9.0 mmHg. Aortic valve peak gradient measures 16.0 mmHg. Aortic valve area, by VTI measures 1.81 cm. Pulmonic Valve: The pulmonic valve was normal in structure. Pulmonic valve regurgitation is not visualized. No evidence of pulmonic stenosis. Aorta: The aortic root is normal in size and structure. Venous: The inferior vena cava is normal  in size with greater than 50% respiratory variability, suggesting right atrial pressure of 3 mmHg. IAS/Shunts: The interatrial septum was not well visualized.  LEFT VENTRICLE PLAX 2D LVIDd:         4.60 cm  Diastology LVIDs:         2.50 cm  LV e' medial:    5.98 cm/s LV PW:         1.40 cm  LV E/e' medial:  26.4 LV IVS:        1.20 cm  LV e' lateral:   7.40 cm/s LVOT diam:     1.80 cm  LV E/e' lateral: 21.4 LV SV:         85 LV SV Index:   50 LVOT Area:     2.54 cm  RIGHT VENTRICLE RV Basal diam:  2.60 cm RV S prime:     14.70 cm/s TAPSE (M-mode): 2.5 cm LEFT ATRIUM             Index       RIGHT ATRIUM           Index LA diam:        3.50 cm 2.04 cm/m  RA Area:     13.90 cm LA Vol (A2C):   52.7 ml 30.78 ml/m RA Volume:   31.50 ml  18.40 ml/m LA Vol (A4C):   73.4 ml 42.87 ml/m LA Biplane Vol: 63.9 ml 37.32 ml/m  AORTIC VALVE AV Area (Vmax):    1.72 cm AV Area (Vmean):   1.83 cm AV Area (VTI):     1.81 cm AV Vmax:           200.00 cm/s AV Vmean:          143.000 cm/s AV VTI:            0.471 m AV Peak Grad:      16.0 mmHg AV Mean Grad:      9.0 mmHg LVOT Vmax:         135.00 cm/s LVOT Vmean:        103.000 cm/s LVOT VTI:          0.335 m LVOT/AV VTI ratio: 0.71  AORTA Ao Root diam: 2.90 cm Ao Asc diam:  3.40 cm MITRAL VALVE MV Area (PHT): 2.64 cm     SHUNTS MV Area VTI:   2.47 cm      Systemic VTI:  0.34 m MV Peak grad:  7.1 mmHg     Systemic Diam: 1.80 cm MV Mean grad:  3.0 mmHg MV Vmax:       1.33 m/s MV Vmean:      74.9 cm/s MV Decel Time: 287 msec MV E velocity: 158.00 cm/s MV A velocity: 126.00 cm/s MV E/A ratio:  1.25 Jenkins Rouge MD Electronically signed by Jenkins Rouge MD Signature Date/Time: 03/04/2021/11:03:37 AM    Final    [place] ASCITES (ABDOMEN LIMITED)  Result Date: 03/01/2021 CLINICAL DATA:  Cirrhosis, trace ascites seen on same day CT abdomen pelvis. EXAM: LIMITED ABDOMEN ULTRASOUND FOR ASCITES TECHNIQUE: Limited ultrasound survey for ascites was performed in all four abdominal quadrants. COMPARISON:  Same day CT abdomen pelvis. FINDINGS: No ascites is identified. IMPRESSION: No sonographic evidence of ascites. Electronically Signed   By: Zerita Boers M.D.   On: 03/01/2021 19:50    Microbiology: Recent Results (from the past 240 hour(s))  SARS CORONAVIRUS 2 (TAT 6-24 HRS) Nasopharyngeal Nasopharyngeal Swab     Status: None   Collection  Time: 03/01/21  7:39 PM   Specimen: Nasopharyngeal Swab  Result Value Ref Range Status   SARS Coronavirus 2 NEGATIVE NEGATIVE Final    Comment: (NOTE) SARS-CoV-2 target nucleic acids are NOT DETECTED.  The SARS-CoV-2 RNA is generally detectable in upper and lower respiratory specimens during the acute phase of infection. Negative results do not preclude SARS-CoV-2 infection, do not rule out co-infections with other pathogens, and should not be used as the sole basis for treatment or other patient management decisions. Negative results must be combined with clinical observations, patient history, and epidemiological information. The expected result is Negative.  Fact Sheet for Patients: SugarRoll.be  Fact Sheet for Healthcare Providers: https://www.woods-mathews.com/  This test is not yet approved or cleared by the [place] FDA and  has been authorized for detection and/or  diagnosis of SARS-CoV-2 by FDA under an Emergency Use Authorization (EUA). This EUA will remain  in effect (meaning this test can be used) for the duration of the COVID-19 declaration under Se ction 564(b)(1) of the Act, 21 U.S.C. section 360bbb-3(b)(1), unless the authorization is terminated or revoked sooner.  Performed at [facility], [place] [address] [place], [place] [address]      Labs: Basic Metabolic Panel: Recent Labs  Lab 03/04/21 0204 03/05/21 0016 03/06/21 0156 03/07/21 0058 03/08/21 0026  NA 132* 130* 132* 131* 131*  K 3.8 3.9 3.9 3.8 4.1  CL 99 97* 96* 97* 97*  CO2 '27 25 30 30 27  '$ GLUCOSE 196* 174* 167* 266* 188*  BUN 5* <5* 8 7* 9  CREATININE 0.60 0.63 0.95 0.68 0.94  CALCIUM 9.0 8.8* 8.7* 8.4* 8.3*   Liver Function Tests: Recent Labs  Lab 03/04/21 0204 03/05/21 0016 03/06/21 0156 03/07/21 0058 03/08/21 0026  AST '22 23 20 21 20  '$ ALT '15 14 14 18 15  '$ ALKPHOS 67 69 58 78 69  BILITOT 0.5 0.8 0.4 0.2* 0.4  PROT 6.3* 6.1* 5.7* 6.0* 5.8*  ALBUMIN 3.2* 3.1* 3.0* 3.0* 3.0*   No results for input(s): LIPASE, AMYLASE in the last 168 hours. No results for input(s): AMMONIA in the last 168 hours. CBC: Recent Labs  Lab 03/02/21 0038 03/03/21 0029 03/04/21 0204 03/05/21 0016 03/06/21 0156 03/07/21 0058 03/08/21 0026  WBC 17.5* 10.8* 14.1* 15.2* 13.4* 10.3 10.4  NEUTROABS 6.5 4.4 6.3 6.6  --   --   --   HGB 9.6* 8.6* 8.9* 9.5* 8.4* 8.3* 7.9*  HCT 31.2* 28.0* 30.4* 30.3* 27.6* 27.4* 26.7*  MCV 82.1 82.4 84.0 82.6 82.6 84.0 84.5  PLT 305 250 278 247 245 214 214   Cardiac Enzymes: No results for input(s): CKTOTAL, CKMB, CKMBINDEX, TROPONINI in the last 168 hours. BNP: BNP (last 3 results) No results for input(s): BNP in the last 8760 hours.  ProBNP (last 3 results) No results for input(s): PROBNP in the last 8760 hours.  CBG: Recent Labs  Lab 03/07/21 0601 03/07/21 1130 03/07/21 1648 03/07/21 2115 03/08/21 0635  GLUCAP 218* 311* 291*  243* 160*       Signed:  Domenic Polite MD.  [facility] 03/08/2021, 11:24 AM

## 2021-03-08 NOTE — TOC Transition Note (Addendum)
Transition of Care [facility]) - CM/SW Discharge Note   Patient Details  Name: [name] MRN: [id number] Date of Birth: 10/23/1950  Transition of Care [facility]) CM/SW Contact:  Konrad Penta, RN Phone Number: [phone] 03/08/2021, 1:07 PM   Clinical Narrative:   Confirmed with Angie with [facility] that patient is slated for dc today. Home health previously arranged with [place]. Patient has rolling walker in room as previously arranged. Patient will need home oxygen. Awaiting orders in order for oxygen to be delivered to room prior to discharge. Nursing and MD aware.  Will contact [employer] again once oxygen orders are in.  AddendumDelana Meyer with [employer] contacted at 1343 pm to request oxygen to be delivered to room. Oxygen orders are in.  Final next level of care: [place] Barriers to Discharge: No Barriers Identified   Patient Goals and CMS Choice Patient states their goals for this hospitalization and ongoing recovery are:: return home      Discharge Placement                 Discharge Plan and Services   Discharge Planning Services: CM Consult            DME Arranged: Gilford Rile rolling   Date DME Agency Contacted: 03/07/21 Time DME Agency Contacted: [phone] Representative spoke with at DME Agency: [place]: PT [place]: [place] Date [place]: 03/07/21 Time [place]: [phone] Representative spoke with at [place]: [place] ([place]) Interventions     Readmission Risk Interventions No flowsheet data found.

## 2021-03-08 NOTE — Progress Notes (Signed)
RN went over d.c summary with pt and pt's husband. Home O2 delivered to bedside. Belongings with pt's husband. Pt's husband to transport pt home. Another RN transported pt to private vehicle.

## 2021-03-08 NOTE — Progress Notes (Signed)
SATURATION QUALIFICATIONS: (This note is used to comply with regulatory documentation for home oxygen)  Patient Saturations on Room Air at Rest = 93%  Patient Saturations on Room Air while Ambulating = 88%  Patient Saturations on 2 Liters of oxygen while Ambulating = 88%  Please briefly explain why patient needs home oxygen: pt desats when ambulating and feels short of breath

## 2021-03-10 ENCOUNTER — Ambulatory Visit: Payer: Medicare HMO | Admitting: Cardiology

## 2021-03-14 ENCOUNTER — Other Ambulatory Visit: Payer: Self-pay

## 2021-03-14 ENCOUNTER — Encounter: Payer: Self-pay | Admitting: Cardiology

## 2021-03-14 ENCOUNTER — Ambulatory Visit: Payer: Medicare HMO | Admitting: Cardiology

## 2021-03-14 VITALS — BP 122/40 | HR 56 | Ht 62.0 in | Wt 152.8 lb

## 2021-03-14 DIAGNOSIS — D649 Anemia, unspecified: Secondary | ICD-10-CM | POA: Diagnosis not present

## 2021-03-14 DIAGNOSIS — E782 Mixed hyperlipidemia: Secondary | ICD-10-CM | POA: Diagnosis not present

## 2021-03-14 DIAGNOSIS — I25119 Atherosclerotic heart disease of native coronary artery with unspecified angina pectoris: Secondary | ICD-10-CM

## 2021-03-14 NOTE — Patient Instructions (Addendum)
Medication Instructions:  Your physician recommends that you continue on your current medications as directed. Please refer to the Current Medication list given to you today.  Labwork: none  Testing/Procedures: none  Follow-Up: Your physician recommends that you schedule a follow-up appointment in: 3 months  Any Other Special Instructions Will Be Listed Below (If Applicable). Dr. Posey Pronto at [facility] [phone]  If you need a refill on your cardiac medications before your next appointment, please call your pharmacy.
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
## 2021-03-25 ENCOUNTER — Other Ambulatory Visit: Payer: Self-pay | Admitting: Family Medicine

## 2021-03-27 ENCOUNTER — Other Ambulatory Visit: Payer: Self-pay | Admitting: Family Medicine

## 2021-03-27 DIAGNOSIS — N6452 Nipple discharge: Secondary | ICD-10-CM

## 2021-03-27 DIAGNOSIS — N6310 Unspecified lump in the right breast, unspecified quadrant: Secondary | ICD-10-CM

## 2021-04-15 ENCOUNTER — Other Ambulatory Visit (INDEPENDENT_AMBULATORY_CARE_PROVIDER_SITE_OTHER): Payer: Medicare HMO

## 2021-04-15 ENCOUNTER — Ambulatory Visit: Payer: Medicare HMO | Admitting: Gastroenterology

## 2021-04-15 ENCOUNTER — Encounter: Payer: Self-pay | Admitting: Gastroenterology

## 2021-04-15 VITALS — BP 130/60 | HR 65 | Ht 62.0 in | Wt 152.6 lb

## 2021-04-15 DIAGNOSIS — K259 Gastric ulcer, unspecified as acute or chronic, without hemorrhage or perforation: Secondary | ICD-10-CM

## 2021-04-15 DIAGNOSIS — K582 Mixed irritable bowel syndrome: Secondary | ICD-10-CM

## 2021-04-15 DIAGNOSIS — R1084 Generalized abdominal pain: Secondary | ICD-10-CM | POA: Diagnosis not present

## 2021-04-15 DIAGNOSIS — K7469 Other cirrhosis of liver: Secondary | ICD-10-CM

## 2021-04-15 DIAGNOSIS — I8510 Secondary esophageal varices without bleeding: Secondary | ICD-10-CM

## 2021-04-15 DIAGNOSIS — D500 Iron deficiency anemia secondary to blood loss (chronic): Secondary | ICD-10-CM | POA: Diagnosis not present

## 2021-04-15 DIAGNOSIS — R195 Other fecal abnormalities: Secondary | ICD-10-CM

## 2021-04-15 LAB — CBC WITH DIFFERENTIAL/PLATELET
Basophils Absolute: 0.1 10*3/uL (ref 0.0–0.1)
Basophils Relative: 1.3 % (ref 0.0–3.0)
Eosinophils Absolute: 0.3 10*3/uL (ref 0.0–0.7)
Eosinophils Relative: 2.5 % (ref 0.0–5.0)
HCT: 37.2 % (ref 36.0–46.0)
Hemoglobin: 12 g/dL (ref 12.0–15.0)
Lymphocytes Relative: 47.2 % — ABNORMAL HIGH (ref 12.0–46.0)
Lymphs Abs: 5.3 10*3/uL — ABNORMAL HIGH (ref 0.7–4.0)
MCHC: 32.2 g/dL (ref 30.0–36.0)
MCV: 90.5 fl (ref 78.0–100.0)
Monocytes Absolute: 0.8 10*3/uL (ref 0.1–1.0)
Monocytes Relative: 6.9 % (ref 3.0–12.0)
Neutro Abs: 4.7 10*3/uL (ref 1.4–7.7)
Neutrophils Relative %: 42.1 % — ABNORMAL LOW (ref 43.0–77.0)
Platelets: 215 10*3/uL (ref 150.0–400.0)
RBC: 4.11 Mil/uL (ref 3.87–5.11)
RDW: 27.4 % — ABNORMAL HIGH (ref 11.5–15.5)
WBC: 11.2 10*3/uL — ABNORMAL HIGH (ref 4.0–10.5)

## 2021-04-15 LAB — BASIC METABOLIC PANEL
BUN: 11 mg/dL (ref 6–23)
CO2: 30 mEq/L (ref 19–32)
Calcium: 9.7 mg/dL (ref 8.4–10.5)
Chloride: 99 mEq/L (ref 96–112)
Creatinine, Ser: 0.69 mg/dL (ref 0.40–1.20)
GFR: 86.61 mL/min (ref >60.00)
Glucose, Bld: 239 mg/dL — ABNORMAL HIGH (ref 70–99)
Potassium: 4.2 mEq/L (ref 3.5–5.1)
Sodium: 136 mEq/L (ref 135–145)

## 2021-04-15 LAB — IBC + FERRITIN
Ferritin: 25.1 ng/mL (ref 10.0–291.0)
Iron: 273 ug/dL — ABNORMAL HIGH (ref 42–145)
Saturation Ratios: 58.9 % — ABNORMAL HIGH (ref 20.0–50.0)
TIBC: 463.4 ug/dL — ABNORMAL HIGH (ref 250.0–450.0)
Transferrin: 331 mg/dL (ref 212.0–360.0)

## 2021-04-15 MED ORDER — OMEPRAZOLE 40 MG PO CPDR: 40.0000 mg | DELAYED_RELEASE_CAPSULE | Freq: Two times a day (BID) | ORAL | 2 refills | Status: DC
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
## 2021-04-15 NOTE — Patient Instructions (Addendum)
If you are age 72 or older, your body mass index should be between 23-30. Your Body mass index is 27.91 kg/m. If this is out of the aforementioned range listed, please consider follow up with your Primary Care Provider. __________________________________________________________  The [facility] providers would like to encourage you to use [facility] to communicate with providers for non-urgent requests or questions.  Due to long hold times on the telephone, sending your provider a message by [facility] may be a faster and more efficient way to get a response.  Please allow 48 business hours for a response.  Please remember that this is for non-urgent requests.   Your provider has requested that you go to the basement level for lab work before leaving today. Press "B" on the elevator. The lab is located at the first door on the left as you exit the elevator.  We have sent in refills of your Omeprazole. Continue taking 1 capsule twice daily.  You have been scheduled to follow up with Dr. [name] May 30, 2021 at 2:20 pm.  It was a pleasure to see you today!  Thank you for trusting me with your gastrointestinal care!

## 2021-04-16 ENCOUNTER — Telehealth: Payer: Self-pay | Admitting: *Deleted

## 2021-04-16 DIAGNOSIS — I25119 Atherosclerotic heart disease of native coronary artery with unspecified angina pectoris: Secondary | ICD-10-CM

## 2021-04-16 LAB — AFP TUMOR MARKER: AFP-Tumor Marker: 4.2 ng/mL

## 2021-04-16 NOTE — Telephone Encounter (Signed)
[name], MD  Doran Stabler, MD Cc: Merlene Laughter, RN; Verta Ellen., NP Thank you for the update Mallie Mussel.  I will see if we can get her scheduled for an office visit in [place] with an eye toward arranging a Lexiscan Myoview for follow-up ischemic surveillance.

## 2021-04-20 ENCOUNTER — Other Ambulatory Visit: Payer: Self-pay | Admitting: Cardiology

## 2021-04-25 NOTE — Telephone Encounter (Signed)
Patient informed and verbalized understanding of plan. 
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
## 2021-04-30 ENCOUNTER — Ambulatory Visit: Payer: Medicare HMO | Admitting: Cardiology

## 2021-04-30 ENCOUNTER — Encounter: Payer: Self-pay | Admitting: *Deleted

## 2021-04-30 ENCOUNTER — Encounter: Payer: Self-pay | Admitting: Cardiology

## 2021-04-30 VITALS — BP 130/58 | HR 58 | Ht 62.0 in | Wt 156.8 lb

## 2021-04-30 DIAGNOSIS — E782 Mixed hyperlipidemia: Secondary | ICD-10-CM | POA: Diagnosis not present

## 2021-04-30 DIAGNOSIS — I25119 Atherosclerotic heart disease of native coronary artery with unspecified angina pectoris: Secondary | ICD-10-CM | POA: Diagnosis not present

## 2021-04-30 NOTE — Patient Instructions (Addendum)

## 2021-05-05 ENCOUNTER — Other Ambulatory Visit: Payer: Self-pay

## 2021-05-05 ENCOUNTER — Encounter (HOSPITAL_COMMUNITY)
Admission: RE | Admit: 2021-05-05 | Discharge: 2021-05-05 | Disposition: A | Discharge status: Home / Self Care | Payer: Medicare HMO | Source: Ambulatory Visit | Attending: Cardiology | Admitting: Cardiology

## 2021-05-05 ENCOUNTER — Ambulatory Visit (HOSPITAL_COMMUNITY)
Admission: RE | Admit: 2021-05-05 | Discharge: 2021-05-05 | Disposition: A | Discharge status: Home / Self Care | Payer: Medicare HMO | Source: Ambulatory Visit | Attending: Cardiology | Admitting: Cardiology

## 2021-05-05 DIAGNOSIS — I25119 Atherosclerotic heart disease of native coronary artery with unspecified angina pectoris: Secondary | ICD-10-CM | POA: Insufficient documentation

## 2021-05-05 LAB — NM MYOCAR MULTI W/SPECT W/WALL MOTION / EF
Base ST Depression (mm): 0 mm
LV dias vol: 78 mL (ref 46–106)
LV sys vol: 19 mL
Nuc Stress EF: 75 %
Peak HR: 58 {beats}/min
RATE: 0.4
Rest HR: 47 {beats}/min
Rest Nuclear Isotope Dose: 10 mCi
SDS: 8
SRS: 0
SSS: 8
ST Depression (mm): 0 mm
Stress Nuclear Isotope Dose: 30 mCi
TID: 1.13

## 2021-05-05 MED ORDER — SODIUM CHLORIDE FLUSH 0.9 % IV SOLN
INTRAVENOUS | Status: AC
  Administered 2021-05-05: 10 mL via INTRAVENOUS
  Filled 2021-05-05: qty 10

## 2021-05-05 MED ORDER — TECHNETIUM TC 99M TETROFOSMIN IV KIT
30.0000 | PACK | Freq: Once | INTRAVENOUS | Status: AC | PRN
  Administered 2021-05-05: 30 via INTRAVENOUS

## 2021-05-05 MED ORDER — REGADENOSON 0.4 MG/5ML IV SOLN
INTRAVENOUS | Status: AC
  Administered 2021-05-05: 0.4 mg via INTRAVENOUS
  Filled 2021-05-05: qty 5

## 2021-05-05 MED ORDER — TECHNETIUM TC 99M TETROFOSMIN IV KIT
10.0000 | PACK | Freq: Once | INTRAVENOUS | Status: AC | PRN
  Administered 2021-05-05: 10 via INTRAVENOUS

## 2021-05-06 ENCOUNTER — Telehealth: Payer: Self-pay | Admitting: *Deleted

## 2021-05-06 NOTE — Telephone Encounter (Signed)
Patient informed and verbalized understanding of plan. Office visit arranged since EKG needed Copy sent to PCP

## 2021-05-06 NOTE — Telephone Encounter (Signed)
-----   Message from [name], MD sent at 05/05/2021  7:48 PM EDT ----- Results reviewed.  Myoview results reviewed.  Overall difficult situation as this was termed a high risk study with evidence of LAD distribution ischemia.  I think we are going to need to clarify her coronary anatomy with cardiac catheterization prior to clearing her for further endoscopic procedures.  My hope is that [name] will not require PCI however as this would increase her bleeding risk with need for dual antiplatelet therapy and also prevent her from undergoing endoscopy for at least 6 months.  Hopefully, coronary anatomy will be such that [name] can be managed medically without PCI.  Please call and see if [name] would be willing to have [place] schedule a cardiac catheterization at her earliest convenience.  A virtual phone encounter could also be scheduled to review risks and benefits and update the recent note.

## 2021-05-07 NOTE — H&P (View-Only) (Signed)
Cardiology Office Note  Date: 05/08/2021   ID: [name], DOB June 25, 1951, MRN [id number]  PCP:  Caryl Bis, MD  Cardiologist:  Rozann Lesches, MD Electrophysiologist:  None   Chief Complaint: Follow-up of abnormal stress test  History of Present Illness: [name] is a 70 y.o. female with a HTN, CAD, Barrett's esophagus, esophageal varices, HLD, OSA, GERD, DM2.  She was recently seen by Dr. Domenic Polite on 04/30/2021.  She had been seen by gastroenterology in July and reported possible anginal symptoms.  There was concern regarding scheduling follow-up endoscopic procedures.  Lexiscan Myoview was ordered.  She was continuing her aspirin, Hyzaar, Lopressor, Lipitor, cardia XT and Imdur.  She was continuing Lipitor for hyperlipidemia with most recent LDL of 75.  Her anemia was improved with hemoglobin of 12 on iron supplementation.  Her Lexiscan Myoview was determined to be high risk.  There was a medium size defect around 10 to 12% of the LV with moderate perfusion defect present in the mid to apical anteroseptum and apex that was reversible.  Findings were consistent for ischemia in LAD distribution.  LVEF greater than 65%.  Dr. Domenic Polite stated she would need cardiac catheterization prior to clearing her for further endoscopic procedures.  She is here for follow-up secondary to recent abnormal stress test and need for cardiac catheterization.  She states she has been taking up to 2 sublingual nitroglycerin as needed for chest pain relief.  She states that so far she has not had to take a third sublingual nitroglycerin.  We discussed the results of the stress test.  We discussed the risk and benefits of cardiac catheterization.  She understands the risk and is willing to proceed.  We discussed increasing Imdur to 30 mg daily and atorvastatin to 40 mg daily.  She has a left heart cath scheduled for 05/12/2021 at 0900 with Dr. Saunders Revel.    Past Medical History:  Diagnosis Date    Anxiety    Arthritis    Asthma    Barrett esophagus    Chronic bronchitis (HCC)    Chronic diarrhea    Chronic lower back pain    Coronary atherosclerosis of native coronary artery    DES to LAD May 2014   Diverticulosis    Essential hypertension    Fatty liver    Fibromyalgia    GERD (gastroesophageal reflux disease)    H/O hiatal hernia    IBS (irritable bowel syndrome)    Major depression in partial remission ([place])    Migraines    Mixed hyperlipidemia    Myocardial infarction ([place])    2014   OSA on CPAP    Osteoporosis    Pancreatitis 2008   Pneumonia 1990's   Schatzki's ring    Sleep apnea    Type 2 diabetes mellitus ([place])     Past Surgical History:  Procedure Laterality Date   69 HOUR [place] STUDY N/A 02/16/2018   Procedure: 24 HOUR PH STUDY;  Surgeon: Mauri Pole, MD;  Location: [facility];  Service: Endoscopy;  Laterality: N/A;   ABDOMINAL HYSTERECTOMY  ?1987   APPENDECTOMY  ?1987   BIOPSY  03/02/2021   Procedure: BIOPSY;  Surgeon: Lavena Bullion, DO;  Location: [facility];  Service: Gastroenterology;;   BUNIONECTOMY Bilateral [place] Bilateral ?1990   CATARACT EXTRACTION W/ INTRAOCULAR LENS  IMPLANT, BILATERAL Bilateral ?2005   [place]  1990'S   COLONOSCOPY  10/13   Dr. Britta Mccreedy - tubular adenomas and diverticulosis   DILATION AND CURETTAGE OF UTERUS  1970's   "probably 3" (12/09/2012)   ERCP  2008   ESOPHAGEAL DILATION  ` 2012   ESOPHAGEAL MANOMETRY N/A 10/21/2015   Procedure: ESOPHAGEAL MANOMETRY (EM);  Surgeon: Doran Stabler, MD;  Location: [facility];  Service: Gastroenterology;  Laterality: N/A;   ESOPHAGEAL MANOMETRY N/A 02/16/2018   Procedure: ESOPHAGEAL MANOMETRY (EM);  Surgeon: Mauri Pole, MD;  Location: [facility];  Service: Endoscopy;  Laterality: N/A;   ESOPHAGOGASTRODUODENOSCOPY Left 03/02/2021   Procedure: ESOPHAGOGASTRODUODENOSCOPY (EGD);  Surgeon: Lavena Bullion,  DO;  Location[facility] END[facility]COPY;  Service: Gastroenterology;  Laterality: Left;   HUMERUS FRACTURE SURGERY Right 07/19/1969   "horst threw me" (12/09/2012)   LEFT HEART CATH AND CORONARY ANGIOGRAPHY N/A 12/29/2017   Procedure: LEFT HEART CATH AND CORONARY ANGIOGRAPHY;  Surgeon: Wellington Hampshire, MD;  Location: [facility];  Service: Cardiovascular;  Laterality: N/A;   NASAL SEPTUM SURGERY  1970's   PERCUTANEOUS CORONARY STENT INTERVENTION (PCI-S) N/A 12/09/2012   Procedure: PERCUTANEOUS CORONARY STENT INTERVENTION (PCI-S);  Surgeon: Burnell Blanks, MD;  Location: [facility] CATH LAB;  Service: Cardiovascular;  Laterality: N/A;   [place] IMPEDANCE STUDY N/A 02/16/2018   Procedure: [place] IMPEDANCE STUDY;  Surgeon: Mauri Pole, MD;  Location: [facility];  Service: Endoscopy;  Laterality: N/A;   TOE FUSION Right 10/11   First MTP joint   TOE SURGERY Right 1990's   "toe next to big toe:  dr said I had tumor; cut bone & stuff; another OR dr stretched tendons, etc" (12/09/2012)   TUBAL LIGATION  1985    Current Outpatient Medications  Medication Sig Dispense Refill   albuterol (PROVENTIL HFA;VENTOLIN HFA) 108 (90 Base) MCG/ACT inhaler Inhale 2 puffs into the lungs every 4 (four) hours as needed.  3   ALPRAZolam (XANAX) 1 MG tablet Take 1 mg by mouth at bedtime.  1   aspirin EC 81 MG tablet Take 81 mg by mouth daily.     atorvastatin (LIPITOR) 40 MG tablet Take 1 tablet (40 mg total) by mouth daily. 90 tablet 3   cetirizine (ZYRTEC) 10 MG tablet Take 10 mg by mouth daily as needed for allergies.     dicyclomine (BENTYL) 10 MG capsule Take 1 capsule (10 mg total) by mouth 3 (three) times daily as needed for spasms. TAKE ONE CAPSULE BY MOUTH TWICE DAILY 20-30 MINUTES BEFORE BREAKFAST AND DINNER 90 capsule 0   DULoxetine (CYMBALTA) 60 MG capsule Take 60 mg by mouth daily.     FEROSUL 325 (65 Fe) MG tablet Take 325 mg by mouth daily.     HYDROcodone-acetaminophen (NORCO/VICODIN) 5-325 MG tablet Take  1 tablet by mouth 2 (two) times daily.  0   losartan-hydrochlorothiazide (HYZAAR) 100-25 MG tablet Take 1 tablet by mouth daily.     montelukast (SINGULAIR) 10 MG tablet Take 10 mg by mouth at bedtime.      NOVOLOG FLEXPEN 100 UNIT/ML FlexPen Inject 0-30 Units into the skin in the morning and at bedtime.     omeprazole (PRILOSEC) 40 MG capsule Take 1 capsule (40 mg total) by mouth 2 (two) times daily. 180 capsule 2   promethazine (PHENERGAN) 25 MG tablet Take 25 mg by mouth at bedtime. Takes prn and at times does 1/2 tablet instead of whole one     traZODone (DESYREL) 50 MG tablet Take 100-150 mg by mouth at bedtime. 4 tabs  hs     TRESIBA FLEXTOUCH 200 UNIT/ML FlexTouch Pen Inject 40 Units into the skin daily. As directed     diltiazem (CARTIA XT) 120 MG 24 hr capsule Take 1 capsule (120 mg total) by mouth every morning. 90 capsule 3   isosorbide mononitrate (IMDUR) 30 MG 24 hr tablet Take 1 tablet (30 mg total) by mouth daily. 90 tablet 1   metoprolol tartrate (LOPRESSOR) 25 MG tablet Take 1 tablet (25 mg total) by mouth 2 (two) times daily. 180 tablet 3   nitroGLYCERIN (NITROSTAT) 0.4 MG SL tablet Place 1 tablet (0.4 mg total) under the tongue every 5 (five) minutes x 3 doses as needed (if no relief after 3rd dose, proceed to the ED for an evaluation or call 911). 25 tablet 3   No current facility-administered medications for this visit.   Allergies:  Erythromycin base, Azithromycin, Pollen extract, Remeron [mirtazapine], Zolpidem tartrate, Other, Sulfonamide derivatives, and Tree extract   Social History: The patient  reports that she quit smoking about 27 years ago. Her smoking use included cigarettes. She started smoking about 44 years ago. She has a 12.00 pack-year smoking history. She has never used smokeless tobacco. She reports that she does not drink alcohol and does not use drugs.   Family History: The patient's family history includes Cirrhosis in her father; Colon polyps in her  sister; Esophageal cancer in her sister; Hypertension in her father; Prostate cancer in her brother; Stroke in her mother.   ROS:  Please see the history of present illness. Otherwise, complete review of systems is positive for none.  All other systems are reviewed and negative.   Physical Exam: VS:  BP (!) 134/58   Pulse (!) 56   Ht 5\' 2"  (1.575 m)   Wt 155 lb 9.6 oz (70.6 kg)   SpO2 97%   BMI 28.46 kg/m , BMI Body mass index is 28.46 kg/m.  Wt Readings from Last 3 Encounters:  05/08/21 155 lb 9.6 oz (70.6 kg)  04/30/21 156 lb 12.8 oz (71.1 kg)  04/15/21 152 lb 9.6 oz (69.2 kg)    General: Patient appears comfortable at rest. Neck: Supple, no elevated JVP or carotid bruits, no thyromegaly. Lungs: Clear to auscultation, nonlabored breathing at rest. Cardiac: Regular rate and rhythm, no S3 or significant systolic murmur, no pericardial rub. Extremities: No pitting edema, distal pulses 2+. Skin: Warm and dry. Musculoskeletal: No kyphosis. Neuropsychiatric: Alert and oriented x3, affect grossly appropriate.  ECG: EKG sinus bradycardia rate of 56 no ST or T wave abnormalities.  No ectopy  Recent Labwork: 03/08/2021: ALT 15; AST 20 04/15/2021: BUN 11; Creatinine, Ser 0.69; Hemoglobin 12.0; Platelets 215.0; Potassium 4.2; Sodium 136     Component Value Date/Time   CHOL 160 12/29/2017 0258   TRIG 191 (H) 12/29/2017 0258   HDL 40 (L) 12/29/2017 0258   CHOLHDL 4.0 12/29/2017 0258   VLDL 38 12/29/2017 0258   LDLCALC 82 12/29/2017 0258    Other Studies Reviewed Today:  [place] Myoview 05/05/2021   Findings are consistent with ischemia. The study is high risk.   No ST deviation was noted.   LV perfusion is abnormal. There is evidence of ischemia. There is no evidence of infarction. Defect 1: There is a medium defect with moderate reduction in uptake present in the apical to mid anteroseptal location(s) that is reversible. There is normal wall motion in the defect area. Consistent  with ischemia.   Left ventricular function is normal. Nuclear stress EF:  75 %. The left ventricular ejection fraction is hyperdynamic (>65%). End diastolic cavity size is normal.   There is a medium size (~10-12% of LV), moderate perfusion defect present in the mid to apical anteroseptum, and apex, that is reversible. Findings are concerning for ischemia in the LAD distribution.  Normal LVEF, >65% This is a high risk study with findings suggestive of LAD ischemia.     Echocardiogram 03/04/2021:  1. Left ventricular ejection fraction, by estimation, is 60 to 65%. The  left ventricle has normal function. The left ventricle has no regional  wall motion abnormalities. There is mild left ventricular hypertrophy.  Left ventricular diastolic parameters  are consistent with Grade II diastolic dysfunction (pseudonormalization).  Elevated left ventricular end-diastolic pressure.   2. Right ventricular systolic function is normal. The right ventricular  size is normal.   3. The mitral valve is abnormal. Mild mitral valve regurgitation. No  evidence of mitral stenosis.   4. The aortic valve is tricuspid. There is moderate calcification of the  aortic valve. There is moderate thickening of the aortic valve. Aortic  valve regurgitation is not visualized. Mild aortic valve stenosis.   5. The inferior vena cava is normal in size with greater than 50%  respiratory variability, suggesting right atrial pressure of 3 mmHg.     Assessment and Plan:  1. Abnormal stress test   2. Coronary artery disease involving native coronary artery of native heart with angina pectoris ([place])   3. Symptomatic anemia   4. Mixed hyperlipidemia   5. Pre-operative cardiovascular examination   6. Medication management    1. Abnormal stress test Recent abnormal stress test with history of CAD with previous stent to LAD.  We discussed the results of the stress test today.  I informed her this was a high risk stress test and  the need for cardiac catheterization.  We discussed risks and benefits in detail.  She verbalizes understanding.  Cardiac catheterization has been scheduled for 05/12/2021 at 60 AM Dr. Saunders Revel  2. Coronary artery disease involving native coronary artery of native heart with angina pectoris (HCC)/accelerating angina Has a history of CAD with previous cardiac catheterization in 2019.  Previous left heart cath 12/29/2017 with mild RCA lesion 40%, proximal circumflex lesion 30%, ostial first diagonal 40%.  Previously placed mid LAD stent widely patent.  Increase Imdur to 30 mg daily.  Continue sublingual nitroglycerin as needed, metoprolol 25 mg p.o. twice daily, aspirin 81 mg daily.  Diltiazem 120 mg p.o. daily.   3. Symptomatic anemia History of symptomatic anemia.  Recent hemoglobin 12.  No active bleeding currently.  History of diverticulitis/diverticulosis.  4. Mixed hyperlipidemia Increase atorvastatin to 40 mg daily and get an FLP and LFT in 8 to 12 weeks.  5.  Essential hypertension. Blood pressure today 134/58.  Continue Hyzaar 100/25 mg p.o. daily.  Metoprolol 25 mg p.o. twice daily.,  Diltiazem 120 mg p.o. daily.  Medication Adjustments/Labs and Tests Ordered: Current medicines are reviewed at length with the patient today.  Concerns regarding medicines are outlined above.   Disposition: Follow-up with Dr. Domenic Polite or APP after cardiac catheterization  Signed, Levell July, NP 05/08/2021 10:16 AM    [facility] [place], [place], [place], [place] [address] Phone: [phone]; Fax: [phone]

## 2021-05-07 NOTE — Progress Notes (Addendum)
Cardiology Office Note  Date: 05/08/2021   ID: [name], DOB 12/17/1950, MRN [id number]  PCP:  Caryl Bis, MD  Cardiologist:  Rozann Lesches, MD Electrophysiologist:  None   Chief Complaint: Follow-up of abnormal stress test  History of Present Illness: [name] is a 70 y.o. female with a HTN, CAD, Barrett's esophagus, esophageal varices, HLD, OSA, GERD, DM2.  She was recently seen by Dr. Domenic Polite on 04/30/2021.  She had been seen by gastroenterology in July and reported possible anginal symptoms.  There was concern regarding scheduling follow-up endoscopic procedures.  Lexiscan Myoview was ordered.  She was continuing her aspirin, Hyzaar, Lopressor, Lipitor, cardia XT and Imdur.  She was continuing Lipitor for hyperlipidemia with most recent LDL of 75.  Her anemia was improved with hemoglobin of 12 on iron supplementation.  Her Lexiscan Myoview was determined to be high risk.  There was a medium size defect around 10 to 12% of the LV with moderate perfusion defect present in the mid to apical anteroseptum and apex that was reversible.  Findings were consistent for ischemia in LAD distribution.  LVEF greater than 65%.  Dr. Domenic Polite stated she would need cardiac catheterization prior to clearing her for further endoscopic procedures.  She is here for follow-up secondary to recent abnormal stress test and need for cardiac catheterization.  She states she has been taking up to 2 sublingual nitroglycerin as needed for chest pain relief.  She states that so far she has not had to take a third sublingual nitroglycerin.  We discussed the results of the stress test.  We discussed the risk and benefits of cardiac catheterization.  She understands the risk and is willing to proceed.  We discussed increasing Imdur to 30 mg daily and atorvastatin to 40 mg daily.  She has a left heart cath scheduled for 05/12/2021 at 0900 with Dr. Saunders Revel.    Past Medical History:  Diagnosis Date    Anxiety    Arthritis    Asthma    Barrett esophagus    Chronic bronchitis (HCC)    Chronic diarrhea    Chronic lower back pain    Coronary atherosclerosis of native coronary artery    DES to LAD May 2014   Diverticulosis    Essential hypertension    Fatty liver    Fibromyalgia    GERD (gastroesophageal reflux disease)    H/O hiatal hernia    IBS (irritable bowel syndrome)    Major depression in partial remission ([place])    Migraines    Mixed hyperlipidemia    Myocardial infarction ([place])    2014   OSA on CPAP    Osteoporosis    Pancreatitis 2008   Pneumonia 1990's   Schatzki's ring    Sleep apnea    Type 2 diabetes mellitus ([place])     Past Surgical History:  Procedure Laterality Date   52 HOUR [place] STUDY N/A 02/16/2018   Procedure: 24 HOUR PH STUDY;  Surgeon: Mauri Pole, MD;  Location: [facility];  Service: Endoscopy;  Laterality: N/A;   ABDOMINAL HYSTERECTOMY  ?1987   APPENDECTOMY  ?1987   BIOPSY  03/02/2021   Procedure: BIOPSY;  Surgeon: Lavena Bullion, DO;  Location: [facility];  Service: Gastroenterology;;   BUNIONECTOMY Bilateral [place] Bilateral ?1990   CATARACT EXTRACTION W/ INTRAOCULAR LENS  IMPLANT, BILATERAL Bilateral ?2005   [place]  1990'S   COLONOSCOPY  10/13   Dr. Britta Mccreedy - tubular adenomas and diverticulosis   DILATION AND CURETTAGE OF UTERUS  1970's   "probably 3" (12/09/2012)   ERCP  2008   ESOPHAGEAL DILATION  ` 2012   ESOPHAGEAL MANOMETRY N/A 10/21/2015   Procedure: ESOPHAGEAL MANOMETRY (EM);  Surgeon: Doran Stabler, MD;  Location: [facility];  Service: Gastroenterology;  Laterality: N/A;   ESOPHAGEAL MANOMETRY N/A 02/16/2018   Procedure: ESOPHAGEAL MANOMETRY (EM);  Surgeon: Mauri Pole, MD;  Location: [facility];  Service: Endoscopy;  Laterality: N/A;   ESOPHAGOGASTRODUODENOSCOPY Left 03/02/2021   Procedure: ESOPHAGOGASTRODUODENOSCOPY (EGD);  Surgeon: Lavena Bullion,  DO;  Location: [facility] ENDOSCOPY;  Service: Gastroenterology;  Laterality: Left;   HUMERUS FRACTURE SURGERY Right 07/19/1969   "horst threw me" (12/09/2012)   LEFT HEART CATH AND CORONARY ANGIOGRAPHY N/A 12/29/2017   Procedure: LEFT HEART CATH AND CORONARY ANGIOGRAPHY;  Surgeon: [place], MD;  Location: [facility] CV LAB;  Service: Cardiovascular;  Laterality: N/A;   NASAL SEPTUM SURGERY  1970's   PERCUTANEOUS CORONARY STENT INTERVENTION (PCI-S) N/A 12/09/2012   Procedure: PERCUTANEOUS CORONARY STENT INTERVENTION (PCI-S);  Surgeon: Burnell Blanks, MD;  Location: [facility] CATH LAB;  Service: Cardiovascular;  Laterality: N/A;   [place] IMPEDANCE STUDY N/A 02/16/2018   Procedure: [place] IMPEDANCE STUDY;  Surgeon: Mauri Pole, MD;  Location: [facility];  Service: Endoscopy;  Laterality: N/A;   TOE FUSION Right 10/11   First MTP joint   TOE SURGERY Right 1990's   "toe next to big toe:  dr said I had tumor; cut bone & stuff; another OR dr stretched tendons, etc" (12/09/2012)   TUBAL LIGATION  1985    Current Outpatient Medications  Medication Sig Dispense Refill   albuterol (PROVENTIL HFA;VENTOLIN HFA) 108 (90 Base) MCG/ACT inhaler Inhale 2 puffs into the lungs every 4 (four) hours as needed.  3   ALPRAZolam (XANAX) 1 MG tablet Take 1 mg by mouth at bedtime.  1   aspirin EC 81 MG tablet Take 81 mg by mouth daily.     atorvastatin (LIPITOR) 40 MG tablet Take 1 tablet (40 mg total) by mouth daily. 90 tablet 3   cetirizine (ZYRTEC) 10 MG tablet Take 10 mg by mouth daily as needed for allergies.     dicyclomine (BENTYL) 10 MG capsule Take 1 capsule (10 mg total) by mouth 3 (three) times daily as needed for spasms. TAKE ONE CAPSULE BY MOUTH TWICE DAILY 20-30 MINUTES BEFORE BREAKFAST AND DINNER 90 capsule 0   DULoxetine (CYMBALTA) 60 MG capsule Take 60 mg by mouth daily.     FEROSUL 325 (65 Fe) MG tablet Take 325 mg by mouth daily.     HYDROcodone-acetaminophen (NORCO/VICODIN) 5-325 MG tablet Take  1 tablet by mouth 2 (two) times daily.  0   losartan-hydrochlorothiazide (HYZAAR) 100-25 MG tablet Take 1 tablet by mouth daily.     montelukast (SINGULAIR) 10 MG tablet Take 10 mg by mouth at bedtime.      NOVOLOG FLEXPEN 100 UNIT/ML FlexPen Inject 0-30 Units into the skin in the morning and at bedtime.     omeprazole (PRILOSEC) 40 MG capsule Take 1 capsule (40 mg total) by mouth 2 (two) times daily. 180 capsule 2   promethazine (PHENERGAN) 25 MG tablet Take 25 mg by mouth at bedtime. Takes prn and at times does 1/2 tablet instead of whole one     traZODone (DESYREL) 50 MG tablet Take 100-150 mg by mouth at bedtime. 4 tabs  hs     TRESIBA FLEXTOUCH 200 UNIT/ML FlexTouch Pen Inject 40 Units into the skin daily. As directed     diltiazem (CARTIA XT) 120 MG 24 hr capsule Take 1 capsule (120 mg total) by mouth every morning. 90 capsule 3   isosorbide mononitrate (IMDUR) 30 MG 24 hr tablet Take 1 tablet (30 mg total) by mouth daily. 90 tablet 1   metoprolol tartrate (LOPRESSOR) 25 MG tablet Take 1 tablet (25 mg total) by mouth 2 (two) times daily. 180 tablet 3   nitroGLYCERIN (NITROSTAT) 0.4 MG SL tablet Place 1 tablet (0.4 mg total) under the tongue every 5 (five) minutes x 3 doses as needed (if no relief after 3rd dose, proceed to the ED for an evaluation or call 911). 25 tablet 3   No current facility-administered medications for this visit.   Allergies:  Erythromycin base, Azithromycin, Pollen extract, Remeron [mirtazapine], Zolpidem tartrate, Other, Sulfonamide derivatives, and Tree extract   Social History: The patient  reports that she quit smoking about 27 years ago. Her smoking use included cigarettes. She started smoking about 44 years ago. She has a 12.00 pack-year smoking history. She has never used smokeless tobacco. She reports that she does not drink alcohol and does not use drugs.   Family History: The patient's family history includes Cirrhosis in her father; Colon polyps in her  sister; Esophageal cancer in her sister; Hypertension in her father; Prostate cancer in her brother; Stroke in her mother.   ROS:  Please see the history of present illness. Otherwise, complete review of systems is positive for none.  All other systems are reviewed and negative.   Physical Exam: VS:  BP (!) 134/58   Pulse (!) 56   Ht 5\' 2"  (1.575 m)   Wt 155 lb 9.6 oz (70.6 kg)   SpO2 97%   BMI 28.46 kg/m , BMI Body mass index is 28.46 kg/m.  Wt Readings from Last 3 Encounters:  05/08/21 155 lb 9.6 oz (70.6 kg)  04/30/21 156 lb 12.8 oz (71.1 kg)  04/15/21 152 lb 9.6 oz (69.2 kg)    General: Patient appears comfortable at rest. Neck: Supple, no elevated JVP or carotid bruits, no thyromegaly. Lungs: Clear to auscultation, nonlabored breathing at rest. Cardiac: Regular rate and rhythm, no S3 or significant systolic murmur, no pericardial rub. Extremities: No pitting edema, distal pulses 2+. Skin: Warm and dry. Musculoskeletal: No kyphosis. Neuropsychiatric: Alert and oriented x3, affect grossly appropriate.  ECG: EKG sinus bradycardia rate of 56 no ST or T wave abnormalities.  No ectopy  Recent Labwork: 03/08/2021: ALT 15; AST 20 04/15/2021: BUN 11; Creatinine, Ser 0.69; Hemoglobin 12.0; Platelets 215.0; Potassium 4.2; Sodium 136     Component Value Date/Time   CHOL 160 12/29/2017 0258   TRIG 191 (H) 12/29/2017 0258   HDL 40 (L) 12/29/2017 0258   CHOLHDL 4.0 12/29/2017 0258   VLDL 38 12/29/2017 0258   LDLCALC 82 12/29/2017 0258    Other Studies Reviewed Today:  [place] Myoview 05/05/2021   Findings are consistent with ischemia. The study is high risk.   No ST deviation was noted.   LV perfusion is abnormal. There is evidence of ischemia. There is no evidence of infarction. Defect 1: There is a medium defect with moderate reduction in uptake present in the apical to mid anteroseptal location(s) that is reversible. There is normal wall motion in the defect area. Consistent  with ischemia.   Left ventricular function is normal. Nuclear stress EF:  75 %. The left ventricular ejection fraction is hyperdynamic (>65%). End diastolic cavity size is normal.   There is a medium size (~10-12% of LV), moderate perfusion defect present in the mid to apical anteroseptum, and apex, that is reversible. Findings are concerning for ischemia in the LAD distribution.  Normal LVEF, >65% This is a high risk study with findings suggestive of LAD ischemia.     Echocardiogram 03/04/2021:  1. Left ventricular ejection fraction, by estimation, is 60 to 65%. The  left ventricle has normal function. The left ventricle has no regional  wall motion abnormalities. There is mild left ventricular hypertrophy.  Left ventricular diastolic parameters  are consistent with Grade II diastolic dysfunction (pseudonormalization).  Elevated left ventricular end-diastolic pressure.   2. Right ventricular systolic function is normal. The right ventricular  size is normal.   3. The mitral valve is abnormal. Mild mitral valve regurgitation. No  evidence of mitral stenosis.   4. The aortic valve is tricuspid. There is moderate calcification of the  aortic valve. There is moderate thickening of the aortic valve. Aortic  valve regurgitation is not visualized. Mild aortic valve stenosis.   5. The inferior vena cava is normal in size with greater than 50%  respiratory variability, suggesting right atrial pressure of 3 mmHg.     Assessment and Plan:  1. Abnormal stress test   2. Coronary artery disease involving native coronary artery of native heart with angina pectoris ([place])   3. Symptomatic anemia   4. Mixed hyperlipidemia   5. Pre-operative cardiovascular examination   6. Medication management    1. Abnormal stress test Recent abnormal stress test with history of CAD with previous stent to LAD.  We discussed the results of the stress test today.  I informed her this was a high risk stress test and  the need for cardiac catheterization.  We discussed risks and benefits in detail.  She verbalizes understanding.  Cardiac catheterization has been scheduled for 05/12/2021 at 58 AM Dr. Saunders Revel  2. Coronary artery disease involving native coronary artery of native heart with angina pectoris (HCC)/accelerating angina Has a history of CAD with previous cardiac catheterization in 2019.  Previous left heart cath 12/29/2017 with mild RCA lesion 40%, proximal circumflex lesion 30%, ostial first diagonal 40%.  Previously placed mid LAD stent widely patent.  Increase Imdur to 30 mg daily.  Continue sublingual nitroglycerin as needed, metoprolol 25 mg p.o. twice daily, aspirin 81 mg daily.  Diltiazem 120 mg p.o. daily.   3. Symptomatic anemia History of symptomatic anemia.  Recent hemoglobin 12.  No active bleeding currently.  History of diverticulitis/diverticulosis.  4. Mixed hyperlipidemia Increase atorvastatin to 40 mg daily and get an FLP and LFT in 8 to 12 weeks.  5.  Essential hypertension. Blood pressure today 134/58.  Continue Hyzaar 100/25 mg p.o. daily.  Metoprolol 25 mg p.o. twice daily.,  Diltiazem 120 mg p.o. daily.  Medication Adjustments/Labs and Tests Ordered: Current medicines are reviewed at length with the patient today.  Concerns regarding medicines are outlined above.   Disposition: Follow-up with Dr. Domenic Polite or APP after cardiac catheterization  Signed, Levell July, NP 05/08/2021 10:16 AM    [facility] at [place], [place],  [address] Phone: [phone]; Fax: [phone]

## 2021-05-08 ENCOUNTER — Encounter: Payer: Self-pay | Admitting: Family Medicine

## 2021-05-08 ENCOUNTER — Ambulatory Visit: Payer: Medicare HMO | Admitting: Family Medicine

## 2021-05-08 ENCOUNTER — Telehealth: Payer: Self-pay | Admitting: Family Medicine

## 2021-05-08 VITALS — BP 134/58 | HR 56 | Ht 62.0 in | Wt 155.6 lb

## 2021-05-08 DIAGNOSIS — R9439 Abnormal result of other cardiovascular function study: Secondary | ICD-10-CM

## 2021-05-08 DIAGNOSIS — Z79899 Other long term (current) drug therapy: Secondary | ICD-10-CM

## 2021-05-08 DIAGNOSIS — I25119 Atherosclerotic heart disease of native coronary artery with unspecified angina pectoris: Secondary | ICD-10-CM | POA: Diagnosis not present

## 2021-05-08 DIAGNOSIS — Z01810 Encounter for preprocedural cardiovascular examination: Secondary | ICD-10-CM

## 2021-05-08 DIAGNOSIS — E782 Mixed hyperlipidemia: Secondary | ICD-10-CM

## 2021-05-08 DIAGNOSIS — Z01818 Encounter for other preprocedural examination: Secondary | ICD-10-CM | POA: Diagnosis not present

## 2021-05-08 DIAGNOSIS — D649 Anemia, unspecified: Secondary | ICD-10-CM | POA: Diagnosis not present

## 2021-05-08 MED ORDER — DILTIAZEM HCL ER COATED BEADS 120 MG PO CP24: 120.0000 mg | ORAL_CAPSULE | Freq: Every morning | ORAL | 3 refills | Status: DC

## 2021-05-08 MED ORDER — METOPROLOL TARTRATE 25 MG PO TABS: 25.0000 mg | ORAL_TABLET | Freq: Two times a day (BID) | ORAL | 3 refills | Status: DC

## 2021-05-08 MED ORDER — ATORVASTATIN CALCIUM 40 MG PO TABS: 40.0000 mg | ORAL_TABLET | Freq: Every day | ORAL | 3 refills | Status: DC

## 2021-05-08 MED ORDER — NITROGLYCERIN 0.4 MG SL SUBL: 0.4000 mg | SUBLINGUAL_TABLET | SUBLINGUAL | 3 refills | Status: DC | PRN

## 2021-05-08 MED ORDER — ISOSORBIDE MONONITRATE ER 30 MG PO TB24: 30.0000 mg | ORAL_TABLET | Freq: Every day | ORAL | 1 refills | Status: DC

## 2021-05-08 NOTE — Patient Instructions (Addendum)
Medication Instructions:  Your physician has recommended you make the following change in your medication:  Increase isosorbide mononitrate to 30 mg daily Increase atorvastatin to 40 mg daily Continue other medications the same  Labwork: BMET & CBC today at [place] physician recommends that you return for a FASTING lipid/liver profile on 09/08/2021 at [facility]. Please do not eat or drink for at least 8 hours when you have this done. You may take your medications that morning with a sip of water. Monday-Friday from 7:30 am to 4:00 pm. No appointment needed  Testing/Procedures: Your physician has requested that you have a cardiac catheterization. Cardiac catheterization is used to diagnose and/or treat various heart conditions. Doctors may recommend this procedure for a number of different reasons. The most common reason is to evaluate chest pain. Chest pain can be a symptom of coronary artery disease (CAD), and cardiac catheterization can show whether plaque is narrowing or blocking your heart's arteries. This procedure is also used to evaluate the valves, as well as measure the blood flow and oxygen levels in different parts of your heart. For further information please visit [email or web address]. Please follow instruction sheet, as given.  Follow-Up: Your physician recommends that you schedule a follow-up appointment in: 1 month  Any Other Special Instructions Will Be Listed Below (If Applicable).  If you need a refill on your cardiac medications before your next appointment, please call your pharmacy.   [facility] [address] [place] [address] Dept: [phone] Loc: [place]  05/08/2021  You are scheduled for a Cardiac Catheterization on Monday, October 24 with Dr. Harrell Gave End.  1. Please arrive at the [facility] (Main Entrance A) at  [facility]: [place], [place] [address] at 7:00 AM (This time is two hours before your procedure to ensure your preparation). Free valet parking service is available.   Special note: Every effort is made to have your procedure done on time. Please understand that emergencies sometimes delay scheduled procedures.  2. Diet: Do not eat solid foods after midnight.  The patient may have clear liquids until 5am upon the day of the procedure.  3. Labs: You will need to have blood drawn on Thursday, October 20 at [facility] You do not need to be fasting.  4. Medication instructions in preparation for your procedure:   Contrast Allergy: No    Hold morning & bedtime doses of insulins (novolog and tresiba) & lisinopril/hctz  On the morning of your procedure, take your Aspirin 81  and any morning medicines NOT listed above.  You may use sips of water.  5. Plan for one night stay--bring personal belongings. 6. Bring a current list of your medications and current insurance cards. 7. You MUST have a responsible person to drive you home. 8. Someone MUST be with you the first 24 hours after you arrive home or your discharge will be delayed. 9. Please wear clothes that are easy to get on and off and wear slip-on shoes.  Thank you for allowing [place] to care for you!   -- [facility]

## 2021-05-08 NOTE — Telephone Encounter (Signed)
PERCERT:  Left heart cath dx: abnormal stress test & chest pain 05/12/2021 @9 :00 am Dr. Saunders Revel

## 2021-05-09 ENCOUNTER — Telehealth: Payer: Self-pay | Admitting: Family Medicine

## 2021-05-09 ENCOUNTER — Telehealth: Payer: Self-pay | Admitting: Gastroenterology

## 2021-05-09 MED ORDER — NITROGLYCERIN 0.4 MG SL SUBL: 0.4000 mg | SUBLINGUAL_TABLET | SUBLINGUAL | 3 refills | Status: DC | PRN

## 2021-05-09 NOTE — Telephone Encounter (Signed)
Inbound call from patient her pharmacy be changed for upcoming medication refills for omeprazole and dexiliant. Her new pharmacy is [facility] in [place]

## 2021-05-09 NOTE — Telephone Encounter (Signed)
Pharmacy has already been updated in the [facility] system

## 2021-05-09 NOTE — Telephone Encounter (Signed)
Done

## 2021-05-09 NOTE — Telephone Encounter (Signed)
Please send Rx for [facility] to [facility] [place]

## 2021-05-12 ENCOUNTER — Ambulatory Visit (HOSPITAL_COMMUNITY)
Admission: RE | Admit: 2021-05-12 | Discharge: 2021-05-12 | Disposition: A | Discharge status: Home / Self Care | Payer: Medicare HMO | Attending: Internal Medicine | Admitting: Internal Medicine

## 2021-05-12 ENCOUNTER — Other Ambulatory Visit: Payer: Self-pay

## 2021-05-12 ENCOUNTER — Encounter (HOSPITAL_COMMUNITY)
Admission: RE | Disposition: A | Discharge status: Home / Self Care | Payer: Self-pay | Source: Home / Self Care | Attending: Internal Medicine

## 2021-05-12 ENCOUNTER — Encounter (HOSPITAL_COMMUNITY): Payer: Self-pay | Admitting: Internal Medicine

## 2021-05-12 DIAGNOSIS — I10 Essential (primary) hypertension: Secondary | ICD-10-CM | POA: Diagnosis not present

## 2021-05-12 DIAGNOSIS — Z79899 Other long term (current) drug therapy: Secondary | ICD-10-CM | POA: Diagnosis not present

## 2021-05-12 DIAGNOSIS — K219 Gastro-esophageal reflux disease without esophagitis: Secondary | ICD-10-CM | POA: Diagnosis not present

## 2021-05-12 DIAGNOSIS — Z8249 Family history of ischemic heart disease and other diseases of the circulatory system: Secondary | ICD-10-CM | POA: Insufficient documentation

## 2021-05-12 DIAGNOSIS — I252 Old myocardial infarction: Secondary | ICD-10-CM | POA: Insufficient documentation

## 2021-05-12 DIAGNOSIS — E119 Type 2 diabetes mellitus without complications: Secondary | ICD-10-CM | POA: Diagnosis not present

## 2021-05-12 DIAGNOSIS — Z794 Long term (current) use of insulin: Secondary | ICD-10-CM | POA: Insufficient documentation

## 2021-05-12 DIAGNOSIS — I25118 Atherosclerotic heart disease of native coronary artery with other forms of angina pectoris: Secondary | ICD-10-CM | POA: Diagnosis not present

## 2021-05-12 DIAGNOSIS — D649 Anemia, unspecified: Secondary | ICD-10-CM | POA: Insufficient documentation

## 2021-05-12 DIAGNOSIS — E782 Mixed hyperlipidemia: Secondary | ICD-10-CM | POA: Diagnosis not present

## 2021-05-12 DIAGNOSIS — Z881 Allergy status to other antibiotic agents status: Secondary | ICD-10-CM | POA: Diagnosis not present

## 2021-05-12 DIAGNOSIS — K2270 Barrett's esophagus without dysplasia: Secondary | ICD-10-CM | POA: Diagnosis not present

## 2021-05-12 DIAGNOSIS — I8500 Esophageal varices without bleeding: Secondary | ICD-10-CM | POA: Diagnosis not present

## 2021-05-12 DIAGNOSIS — Z882 Allergy status to sulfonamides status: Secondary | ICD-10-CM | POA: Diagnosis not present

## 2021-05-12 DIAGNOSIS — Z955 Presence of coronary angioplasty implant and graft: Secondary | ICD-10-CM | POA: Insufficient documentation

## 2021-05-12 DIAGNOSIS — Z87891 Personal history of nicotine dependence: Secondary | ICD-10-CM | POA: Diagnosis not present

## 2021-05-12 DIAGNOSIS — Z7982 Long term (current) use of aspirin: Secondary | ICD-10-CM | POA: Diagnosis not present

## 2021-05-12 DIAGNOSIS — G4733 Obstructive sleep apnea (adult) (pediatric): Secondary | ICD-10-CM | POA: Diagnosis not present

## 2021-05-12 DIAGNOSIS — I2510 Atherosclerotic heart disease of native coronary artery without angina pectoris: Secondary | ICD-10-CM | POA: Diagnosis present

## 2021-05-12 DIAGNOSIS — R9439 Abnormal result of other cardiovascular function study: Secondary | ICD-10-CM | POA: Diagnosis present

## 2021-05-12 HISTORY — PX: INTRAVASCULAR PRESSURE WIRE/FFR STUDY: CATH118243

## 2021-05-12 HISTORY — PX: LEFT HEART CATH AND CORONARY ANGIOGRAPHY: CATH118249

## 2021-05-12 LAB — GLUCOSE, CAPILLARY: Glucose-Capillary: 268 mg/dL — ABNORMAL HIGH (ref 70–99)

## 2021-05-12 LAB — POCT ACTIVATED CLOTTING TIME: Activated Clotting Time: 248 seconds

## 2021-05-12 SURGERY — LEFT HEART CATH AND CORONARY ANGIOGRAPHY
Anesthesia: LOCAL

## 2021-05-12 MED ORDER — LIDOCAINE HCL (PF) 1 % IJ SOLN
INTRAMUSCULAR | Status: DC | PRN
  Administered 2021-05-12: 2 mL

## 2021-05-12 MED ORDER — HEPARIN SODIUM (PORCINE) 1000 UNIT/ML IJ SOLN
INTRAMUSCULAR | Status: DC | PRN
  Administered 2021-05-12 (×2): 3500 [IU] via INTRAVENOUS

## 2021-05-12 MED ORDER — SODIUM CHLORIDE 0.9% FLUSH: 3.0000 mL | Freq: Two times a day (BID) | INTRAVENOUS | Status: DC

## 2021-05-12 MED ORDER — VERAPAMIL HCL 2.5 MG/ML IV SOLN
INTRAVENOUS | Status: DC | PRN
  Administered 2021-05-12: 10 mL via INTRA_ARTERIAL

## 2021-05-12 MED ORDER — SODIUM CHLORIDE 0.9% FLUSH: 3.0000 mL | INTRAVENOUS | Status: DC | PRN

## 2021-05-12 MED ORDER — LABETALOL HCL 5 MG/ML IV SOLN: 10.0000 mg | INTRAVENOUS | Status: DC | PRN

## 2021-05-12 MED ORDER — HEPARIN (PORCINE) IN NACL 1000-0.9 UT/500ML-% IV SOLN
INTRAVENOUS | Status: AC
  Filled 2021-05-12: qty 1000

## 2021-05-12 MED ORDER — SODIUM CHLORIDE 0.9 % IV SOLN: INTRAVENOUS | Status: DC

## 2021-05-12 MED ORDER — SODIUM CHLORIDE 0.9 % IV SOLN: 250.0000 mL | INTRAVENOUS | Status: DC | PRN

## 2021-05-12 MED ORDER — SODIUM CHLORIDE 0.9 % WEIGHT BASED INFUSION
3.0000 mL/kg/h | INTRAVENOUS | Status: AC
  Administered 2021-05-12: 3 mL/kg/h via INTRAVENOUS

## 2021-05-12 MED ORDER — LIDOCAINE HCL (PF) 1 % IJ SOLN
INTRAMUSCULAR | Status: AC
  Filled 2021-05-12: qty 30

## 2021-05-12 MED ORDER — ONDANSETRON HCL 4 MG/2ML IJ SOLN: 4.0000 mg | Freq: Four times a day (QID) | INTRAMUSCULAR | Status: DC | PRN

## 2021-05-12 MED ORDER — VERAPAMIL HCL 2.5 MG/ML IV SOLN
INTRAVENOUS | Status: AC
  Filled 2021-05-12: qty 2

## 2021-05-12 MED ORDER — MIDAZOLAM HCL 2 MG/2ML IJ SOLN
INTRAMUSCULAR | Status: AC
  Filled 2021-05-12: qty 2

## 2021-05-12 MED ORDER — HYDRALAZINE HCL 20 MG/ML IJ SOLN: 10.0000 mg | INTRAMUSCULAR | Status: DC | PRN

## 2021-05-12 MED ORDER — FENTANYL CITRATE (PF) 100 MCG/2ML IJ SOLN
INTRAMUSCULAR | Status: AC
  Filled 2021-05-12: qty 2

## 2021-05-12 MED ORDER — MIDAZOLAM HCL 2 MG/2ML IJ SOLN
INTRAMUSCULAR | Status: DC | PRN
  Administered 2021-05-12: 1 mg via INTRAVENOUS

## 2021-05-12 MED ORDER — NITROGLYCERIN 1 MG/10 ML FOR IR/CATH LAB
INTRA_ARTERIAL | Status: DC | PRN
  Administered 2021-05-12: 200 ug via INTRACORONARY

## 2021-05-12 MED ORDER — INSULIN ASPART 100 UNIT/ML IJ SOLN: 0.0000 [IU] | Freq: Three times a day (TID) | INTRAMUSCULAR | Status: DC

## 2021-05-12 MED ORDER — SODIUM CHLORIDE 0.9 % WEIGHT BASED INFUSION: 1.0000 mL/kg/h | INTRAVENOUS | Status: DC

## 2021-05-12 MED ORDER — HEPARIN SODIUM (PORCINE) 1000 UNIT/ML IJ SOLN
INTRAMUSCULAR | Status: AC
  Filled 2021-05-12: qty 1

## 2021-05-12 MED ORDER — NITROGLYCERIN 1 MG/10 ML FOR IR/CATH LAB
INTRA_ARTERIAL | Status: AC
  Filled 2021-05-12: qty 10

## 2021-05-12 MED ORDER — HEPARIN (PORCINE) IN NACL 1000-0.9 UT/500ML-% IV SOLN
INTRAVENOUS | Status: DC | PRN
  Administered 2021-05-12 (×2): 500 mL

## 2021-05-12 MED ORDER — ACETAMINOPHEN 325 MG PO TABS: 650.0000 mg | ORAL_TABLET | ORAL | Status: DC | PRN

## 2021-05-12 MED ORDER — IOHEXOL 350 MG/ML SOLN
INTRAVENOUS | Status: DC | PRN
  Administered 2021-05-12: 75 mL

## 2021-05-12 MED ORDER — ASPIRIN 81 MG PO CHEW
81.0000 mg | CHEWABLE_TABLET | ORAL | Status: DC
Start: 2021-05-13 — End: 2021-05-12

## 2021-05-12 MED ORDER — FENTANYL CITRATE (PF) 100 MCG/2ML IJ SOLN
INTRAMUSCULAR | Status: DC | PRN
  Administered 2021-05-12: 25 ug via INTRAVENOUS

## 2021-05-12 SURGICAL SUPPLY — 13 items
CATH 5FR JL3.5 JR4 ANG PIG MP (CATHETERS) ×2 IMPLANT
CATH LAUNCHER 5F JR4 (CATHETERS) ×2 IMPLANT
DEVICE RAD COMP TR BAND LRG (VASCULAR PRODUCTS) ×2 IMPLANT
DEVICE RAD TR BAND REGULAR (VASCULAR PRODUCTS) ×2 IMPLANT
GLIDESHEATH SLEND SS 6F .021 (SHEATH) ×2 IMPLANT
GUIDEWIRE INQWIRE 1.5J.035X260 (WIRE) ×1 IMPLANT
GUIDEWIRE PRESSURE X 175 (WIRE) ×2 IMPLANT
INQWIRE 1.5J .035X260CM (WIRE) ×2
KIT ESSENTIALS PG (KITS) ×2 IMPLANT
KIT HEART LEFT (KITS) ×2 IMPLANT
PACK CARDIAC CATHETERIZATION (CUSTOM PROCEDURE TRAY) ×2 IMPLANT
TRANSDUCER W/STOPCOCK (MISCELLANEOUS) ×2 IMPLANT
TUBING CIL FLEX 10 FLL-RA (TUBING) ×2 IMPLANT

## 2021-05-12 NOTE — Interval H&P Note (Signed)
History and Physical Interval Note:  05/12/2021 8:22 AM  [name]  has presented today for surgery, with the diagnosis of stable angina and abnormal stress test.  The various methods of treatment have been discussed with the patient and family. After consideration of risks, benefits and other options for treatment, the patient has consented to  Procedure(s): LEFT HEART CATH AND CORONARY ANGIOGRAPHY (N/A) as a surgical intervention.  The patient's history has been reviewed, patient examined, no change in status, stable for surgery.  I have reviewed the patient's chart and labs.  Questions were answered to the patient's satisfaction.    Cath Lab Visit (complete for each Cath Lab visit)  Clinical Evaluation Leading to the Procedure:   ACS: No.  Non-ACS:    Anginal Classification: CCS IV  Anti-ischemic medical therapy: Maximal Therapy (2 or more classes of medications)  Non-Invasive Test Results: High-risk stress test findings: cardiac mortality >3%/year  Prior CABG: No previous CABG  [name]

## 2021-05-15 ENCOUNTER — Telehealth: Payer: Self-pay | Admitting: Gastroenterology

## 2021-05-15 NOTE — Telephone Encounter (Signed)
Spoke with patient in regards to information. Pt will keep follow up as scheduled for 05/30/21 with Dr. [name]. Pt's EGD/colonoscopy has been tentatively scheduled for Thursday, 06/19/21 at 2 pm, pt is aware that she will need to arrive by 1 pm with a care partner. Pt is aware that she will receive written instructions when she comes in for her follow up. Pt verbalized understanding and had no concerns at the end of the call.

## 2021-05-15 NOTE — Telephone Encounter (Signed)
I rec'd the report from Cardiology.  She actually needs an EGD/colonoscopy (in my office note but I think she forgot that), but she needs to come to office first due to medical complexity.  Please go ahead and hold an EGD/colon slot in [place] for her (and give her the date), then will review everything and give instructions at upcoming visit.  - HD

## 2021-05-15 NOTE — Telephone Encounter (Signed)
Patient called stating she had the heart catheterization on Monday and her doctor said she was OK to proceed with the colonoscopy due to her bleeding and weight loss.  Patient has an appt 11/11 with Dr. [name], but had the understanding that you wanted her to do the procedure as soon as she had the catheterization.  I left the office visit on the schedule just in case and did not cancel it yet.  Please call and advise.

## 2021-05-29 ENCOUNTER — Ambulatory Visit: Payer: Medicare HMO | Admitting: Cardiology

## 2021-05-30 ENCOUNTER — Ambulatory Visit: Payer: Medicare HMO | Admitting: Gastroenterology

## 2021-05-30 ENCOUNTER — Encounter: Payer: Self-pay | Admitting: Gastroenterology

## 2021-05-30 VITALS — BP 120/54 | HR 54 | Ht 62.0 in | Wt 157.2 lb

## 2021-05-30 DIAGNOSIS — R1084 Generalized abdominal pain: Secondary | ICD-10-CM

## 2021-05-30 DIAGNOSIS — D500 Iron deficiency anemia secondary to blood loss (chronic): Secondary | ICD-10-CM

## 2021-05-30 DIAGNOSIS — R112 Nausea with vomiting, unspecified: Secondary | ICD-10-CM

## 2021-05-30 MED ORDER — GOLYTELY 236 G PO SOLR: 4000.0000 mL | Freq: Once | ORAL | 0 refills | Status: AC

## 2021-05-30 MED ORDER — METOCLOPRAMIDE HCL 5 MG PO TABS: 5.0000 mg | ORAL_TABLET | Freq: Two times a day (BID) | ORAL | 0 refills | Status: DC | PRN

## 2021-05-30 NOTE — Patient Instructions (Signed)
If you are age 72 or older, your body mass index should be between 23-30. Your Body mass index is 28.75 kg/m. If this is out of the aforementioned range listed, please consider follow up with your Primary Care Provider.  If you are age 22 or younger, your body mass index should be between 19-25. Your Body mass index is 28.75 kg/m. If this is out of the aformentioned range listed, please consider follow up with your Primary Care Provider.   ________________________________________________________  The [place] [facility] providers would like to encourage you to use [facility] to communicate with providers for non-urgent requests or questions.  Due to long hold times on the telephone, sending your provider a message by [facility] may be a faster and more efficient way to get a response.  Please allow 48 business hours for a response.  Please remember that this is for non-urgent requests.  _______________________________________________________  Dennis Bast have been scheduled for an endoscopy and colonoscopy. Please follow the written instructions given to you at your visit today. Please pick up your prep supplies at the pharmacy within the next 1-3 days. If you use inhalers (even only as needed), please bring them with you on the day of your procedure.  It was a pleasure to see you today!  Thank you for trusting me with your gastrointestinal care!

## 2021-05-30 NOTE — Progress Notes (Signed)
[facility] Progress Note  Chief Complaint: Iron deficiency anemia and abdominal pain  Subjective  History: From a 04/15/2021 office note: "Recent diagnosis iron deficiency anemia with reported heme positive stool, unclear if the gastric finding was sufficient to explain this.  I agree she needs a colonoscopy, and I also think she needs a repeat upper endoscopy to see if there is persistent ulceration in a patient on aspirin.  However, she has ongoing exertional dyspnea and chest pain likely related to underlying coronary disease exacerbated by anemia.  Her anemia needs improvement with resolution of chest pain before she can undergo endoscopic procedures.   We discussed the cirrhosis diagnosis, most likely prior fatty liver that was unknown.  LFTs remain normal, platelets and INR also normal.  No encephalopathy, only trace perihepatic ascites on CT scan.  No liver mass seen.  She will need long-term hepatoma screening.  Grade 1 esophageal varices do not require beta-blocker therapy.   Plan: Today, she will go to lab for CBC, BMP (see if persistent hyponatremia and hypochloremia, if so would likely related to HCTZ), also alpha-fetoprotein baseline and iron levels.  If still significantly anemic with low iron levels, she would likely need IV iron since she has been taking twice daily iron tablets for a month.   Return to office in about 6 weeks.  Then discuss timing of EGD and colonoscopy depending on hemoglobin and any persistent cardiac symptoms.   Continue twice daily PPI at least until time of endoscopic procedures." ___________________________________  At that last visit with me, Halley's hemoglobin was unexpectedly much improved, and then I communicated with her cardiologist Dr. Domenic Polite about her chest pain.  She had a nuclear stress test that was abnormal, suggesting anterior ischemia.  However, cardiac catheterization a few weeks ago showed a patent LAD stent and no culprit  lesion to be causing ischemia.  She apparently had some mild RCA disease as well.  Dr. Domenic Polite felt [place] was therefore appropriate for endoscopic testing, and she is on my schedule for December 1st. _______________   [name] was here with her husband today.  As before, she has generalized abdominal pain, nausea with intermittent vomiting.  Her pain is primarily centered in the right upper quadrant and she wonders if this may be related to her liver disease and if she should have a liver biopsy.  She has fibromyalgia and chronic pain syndrome as well.  Generalized fatigue, sometimes stools dark and loose, no melena as described in the hospital. She has intermittent chest pain not consistently with exertion, she takes nitroglycerin from time to time and wonders if some of the chest pain may be reflux related.  ROS: Cardiovascular:  no chest pain Respiratory: no dyspnea Chronic pain syndrome Fatigue Remainder of systems negative except as above Past Medical History:  Diagnosis Date   Anxiety    Arthritis    Asthma    Barrett esophagus    Chronic bronchitis (HCC)    Chronic diarrhea    Chronic lower back pain    Coronary atherosclerosis of native coronary artery    DES to LAD May 2014   Diverticulosis    Essential hypertension    Fatty liver    Fibromyalgia    GERD (gastroesophageal reflux disease)    H/O hiatal hernia    IBS (irritable bowel syndrome)    Major depression in partial remission ([place])    Migraines    Mixed hyperlipidemia    Myocardial infarction ([place])  2014   OSA on CPAP    Osteoporosis    Pancreatitis 2008   Pneumonia 1990's   Schatzki's ring    Sleep apnea    Type 2 diabetes mellitus ([place])    Past Surgical History:  Procedure Laterality Date   53 HOUR [place] STUDY N/A 02/16/2018   Procedure: 24 HOUR PH STUDY;  Surgeon: Mauri Pole, MD;  Location: [facility];  Service: Endoscopy;  Laterality: N/A;   ABDOMINAL HYSTERECTOMY  ?1987   APPENDECTOMY   ?1987   BIOPSY  03/02/2021   Procedure: BIOPSY;  Surgeon: Lavena Bullion, DO;  Location: [facility];  Service: Gastroenterology;;   BUNIONECTOMY Bilateral [place] Bilateral ?1990   CATARACT EXTRACTION W/ INTRAOCULAR LENS  IMPLANT, BILATERAL Bilateral ?2005   [place]  1990'S   COLONOSCOPY  10/13   Dr. Britta Mccreedy - tubular adenomas and diverticulosis   DILATION AND CURETTAGE OF UTERUS  1970's   "probably 3" (12/09/2012)   ERCP  2008   ESOPHAGEAL DILATION  ` 2012   ESOPHAGEAL MANOMETRY N/A 10/21/2015   Procedure: ESOPHAGEAL MANOMETRY (EM);  Surgeon: Doran Stabler, MD;  Location: [facility];  Service: Gastroenterology;  Laterality: N/A;   ESOPHAGEAL MANOMETRY N/A 02/16/2018   Procedure: ESOPHAGEAL MANOMETRY (EM);  Surgeon: Mauri Pole, MD;  Location: [facility];  Service: Endoscopy;  Laterality: N/A;   ESOPHAGOGASTRODUODENOSCOPY Left 03/02/2021   Procedure: ESOPHAGOGASTRODUODENOSCOPY (EGD);  Surgeon: Lavena Bullion, DO;  Location: [facility] END[facility];  Service: Gastroenterology;  Laterality: Left;   HUMERUS FRACTURE SURGERY Right 07/19/1969   "horst threw me" (12/09/2012)   INTRAVASCULAR PRESSURE WIRE/FFR STUDY N/A 05/12/2021   Procedure: INTRAVASCULAR PRESSURE WIRE/FFR STUDY;  Surgeon: Nelva Bush, MD;  Location: [facility];  Service: Cardiovascular;  Laterality: N/A;   LEFT HEART CATH AND CORONARY ANGIOGRAPHY N/A 12/29/2017   Procedure: LEFT HEART CATH AND CORONARY ANGIOGRAPHY;  Surgeon: Wellington Hampshire, MD;  Location: [facility];  Service: Cardiovascular;  Laterality: N/A;   LEFT HEART CATH AND CORONARY ANGIOGRAPHY N/A 05/12/2021   Procedure: LEFT HEART CATH AND CORONARY ANGIOGRAPHY;  Surgeon: Nelva Bush, MD;  Location: [facility];  Service: Cardiovascular;  Laterality: N/A;   NASAL SEPTUM SURGERY  1970's   PERCUTANEOUS CORONARY STENT INTERVENTION (PCI-S) N/A 12/09/2012   Procedure:  PERCUTANEOUS CORONARY STENT INTERVENTION (PCI-S);  Surgeon: Burnell Blanks, MD;  Location: [facility] CATH LAB;  Service: Cardiovascular;  Laterality: N/A;   [place] IMPEDANCE STUDY N/A 02/16/2018   Procedure: [place] IMPEDANCE STUDY;  Surgeon: Mauri Pole, MD;  Location: [facility];  Service: Endoscopy;  Laterality: N/A;   TOE FUSION Right 10/11   First MTP joint   TOE SURGERY Right 1990's   "toe next to big toe:  dr said I had tumor; cut bone & stuff; another OR dr stretched tendons, etc" (12/09/2012)   TUBAL LIGATION  1985    The patient's Past Medical, Family and Social History were reviewed and are on file in the EMR. Dx DM > 10 yrs ago  Objective:  Med list reviewed  Current Outpatient Medications:    albuterol (PROVENTIL HFA;VENTOLIN HFA) 108 (90 Base) MCG/ACT inhaler, Inhale 2 puffs into the lungs every 4 (four) hours as needed., Disp: , Rfl: 3   ALPRAZolam (XANAX) 1 MG tablet, Take 1 mg by mouth at bedtime., Disp: , Rfl: 1   aspirin EC 81 MG tablet, Take 81 mg by mouth daily.,  Disp: , Rfl:    atorvastatin (LIPITOR) 40 MG tablet, Take 1 tablet (40 mg total) by mouth daily., Disp: 90 tablet, Rfl: 3   cetirizine (ZYRTEC) 10 MG tablet, Take 10 mg by mouth daily as needed for allergies., Disp: , Rfl:    dicyclomine (BENTYL) 10 MG capsule, Take 1 capsule (10 mg total) by mouth 3 (three) times daily as needed for spasms. TAKE ONE CAPSULE BY MOUTH TWICE DAILY 20-30 MINUTES BEFORE BREAKFAST AND DINNER, Disp: 90 capsule, Rfl: 0   diltiazem (CARTIA XT) 120 MG 24 hr capsule, Take 1 capsule (120 mg total) by mouth every morning., Disp: 90 capsule, Rfl: 3   DULoxetine (CYMBALTA) 60 MG capsule, Take 60 mg by mouth daily., Disp: , Rfl:    FEROSUL 325 (65 Fe) MG tablet, Take 325 mg by mouth daily., Disp: , Rfl:    HYDROcodone-acetaminophen (NORCO/VICODIN) 5-325 MG tablet, Take 1 tablet by mouth 2 (two) times daily., Disp: , Rfl: 0   isosorbide mononitrate (IMDUR) 30 MG 24 hr tablet, Take 1 tablet  (30 mg total) by mouth daily., Disp: 90 tablet, Rfl: 1   losartan-hydrochlorothiazide (HYZAAR) 100-25 MG tablet, Take 1 tablet by mouth daily., Disp: , Rfl:    metoprolol tartrate (LOPRESSOR) 25 MG tablet, Take 1 tablet (25 mg total) by mouth 2 (two) times daily., Disp: 180 tablet, Rfl: 3   montelukast (SINGULAIR) 10 MG tablet, Take 10 mg by mouth at bedtime. , Disp: , Rfl:    nitroGLYCERIN (NITROSTAT) 0.4 MG SL tablet, Place 1 tablet (0.4 mg total) under the tongue every 5 (five) minutes x 3 doses as needed (if no relief after 3rd dose, proceed to the ED for an evaluation or call 911)., Disp: 25 tablet, Rfl: 3   NOVOLOG FLEXPEN 100 UNIT/ML FlexPen, Inject 0-30 Units into the skin in the morning and at bedtime., Disp: , Rfl:    omeprazole (PRILOSEC) 40 MG capsule, Take 1 capsule (40 mg total) by mouth 2 (two) times daily., Disp: 180 capsule, Rfl: 2   polyethylene glycol (GOLYTELY) 236 g solution, Take 4,000 mLs by mouth once for 1 dose., Disp: 4000 mL, Rfl: 0   promethazine (PHENERGAN) 25 MG tablet, Take 25 mg by mouth at bedtime. Takes prn and at times does 1/2 tablet instead of whole one, Disp: , Rfl:    traZODone (DESYREL) 50 MG tablet, Take 100-150 mg by mouth at bedtime. 4 tabs hs, Disp: , Rfl:    TRESIBA FLEXTOUCH 200 UNIT/ML FlexTouch Pen, Inject 40 Units into the skin daily. As directed, Disp: , Rfl:    Vital signs in last 24 hrs: Vitals:   05/30/21 1419  BP: (!) 120/54  Pulse: (!) 54   Wt Readings from Last 3 Encounters:  05/30/21 157 lb 3.2 oz (71.3 kg)  05/12/21 155 lb (70.3 kg)  05/08/21 155 lb 9.6 oz (70.6 kg)    Physical Exam  No acute distress today HEENT: sclera anicteric, oral mucosa moist without lesions Neck: supple, no thyromegaly, JVD or lymphadenopathy Cardiac: RRR without murmurs, S1S2 heard, no peripheral edema Pulm: clear to auscultation bilaterally, normal RR and effort noted Abdomen: soft, generalized tenderness to light palpation of the abdominal wall,  primarily upper, with active bowel sounds. No guarding or palpable hepatosplenomegaly. Skin; warm and dry, no jaundice or rash  Labs:  CBC Latest Ref Rng & Units 04/15/2021 03/08/2021 03/07/2021  WBC 4.0 - 10.5 K/uL 11.2(H) 10.4 10.3  Hemoglobin 12.0 - 15.0 g/dL 12.0 7.9(L) 8.3(L)  Hematocrit 36.0 -  46.0 % 37.2 26.7(L) 27.4(L)  Platelets 150.0 - 400.0 K/uL 215.0 214 214   Nml QTC reent EKG  CMP Latest Ref Rng & Units 04/15/2021 03/08/2021 03/07/2021  Glucose 70 - 99 mg/dL 239(H) 188(H) 266(H)  BUN 6 - 23 mg/dL 11 9 7(L)  Creatinine 0.40 - 1.20 mg/dL 0.69 0.94 0.68  Sodium 135 - 145 mEq/L 136 131(L) 131(L)  Potassium 3.5 - 5.1 mEq/L 4.2 4.1 3.8  Chloride 96 - 112 mEq/L 99 97(L) 97(L)  CO2 19 - 32 mEq/L 30 27 30   Calcium 8.4 - 10.5 mg/dL 9.7 8.3(L) 8.4(L)  Total Protein 6.5 - 8.1 g/dL - 5.8(L) 6.0(L)  Total Bilirubin 0.3 - 1.2 mg/dL - 0.4 0.2(L)  Alkaline Phos 38 - 126 U/L - 69 78  AST 15 - 41 U/L - 20 21  ALT 0 - 44 U/L - 15 18    ___________________________________________ Radiologic studies:  Nuclear stress test result and cardiac catheterization reports were reviewed.  I have also been in direct communication with Dr. Domenic Polite about this patient on several occasions since her last visit. ____________________________________________ Other:   _____________________________________________ Assessment & Plan  Assessment: Encounter Diagnoses  Name Primary?   Iron deficiency anemia due to chronic blood loss Yes   Generalized abdominal pain    Nausea and vomiting in adult     Iron deficiency anemia, plans for upcoming EGD and colonoscopy.  She had some friable gastropathy on inpatient EGD, not clear if may be from aspirin.  Needs to remain on aspirin due to coronary disease.  Although she had an abnormal stress test, fortunately her catheterization showed stable coronary disease with no intervention needed. She has abdominal pain in the setting of chronic pain syndrome as well as  nausea and vomiting of unclear cause.  Normal 2-hour gastric emptying study in 2016, but has had diabetes for over a decade and I wonder if she may have developed gastroparesis. Plan: Gastric emptying study EGD and colonoscopy as scheduled on 06/19/2021.  She was agreeable after discussion of procedure and risks  The benefits and risks of the planned procedure were described in detail with the patient or (when appropriate) their health care proxy.  Risks were outlined as including, but not limited to, bleeding, infection, perforation, adverse medication reaction leading to cardiac or pulmonary decompensation, pancreatitis (if ERCP).  The limitation of incomplete mucosal visualization was also discussed.  No guarantees or warranties were given.  The benefits and risks of the planned procedure were described in detail with the patient or (when appropriate) their health care proxy.  Risks were outlined as including, but not limited to, bleeding, infection, perforation, adverse medication reaction leading to cardiac or pulmonary decompensation, pancreatitis (if ERCP).  The limitation of incomplete mucosal visualization was also discussed.  No guarantees or warranties were given.   2 doses of metoclopramide will be prescribed to help her keep down bowel prep.   42 minutes were spent on this encounter (including extensive chart review, history/exam, counseling/coordination of care, and documentation) > 50% of that time was spent on counseling and coordination of care.   Nelida Meuse III

## 2021-06-02 ENCOUNTER — Telehealth: Payer: Self-pay | Admitting: Cardiology

## 2021-06-02 NOTE — Telephone Encounter (Signed)
New Message:      Patient wants to know if she will need lab work before her appointment on 06-16-21? If so, she would like to have it her primary doctor office on 06-11-21.Marland Kitchen

## 2021-06-02 NOTE — Telephone Encounter (Signed)
LMTCB

## 2021-06-03 ENCOUNTER — Telehealth: Payer: Self-pay | Admitting: Gastroenterology

## 2021-06-03 NOTE — Telephone Encounter (Signed)
Called and spoke with patient, advised that she can go ahead and proceed with [facility] if they have availability. Pt states that she will call back them back to schedule. Pt had no other concerns at the end of the call.

## 2021-06-03 NOTE — Telephone Encounter (Signed)
Patient called stating [facility] called and wanted her to schedule a gastric emptying study.  She wanted to know if the doctor wanted her to do that prior to or following the endo/colon she has scheduled.  Please call and advise.  Thank you.

## 2021-06-09 NOTE — Telephone Encounter (Signed)
Advised that she did not need lab work before visit on 06/16/21  Advised that she is supposed to have [place] in February 2023 Verbalized understanding

## 2021-06-16 ENCOUNTER — Ambulatory Visit: Payer: Medicare HMO | Admitting: Cardiology

## 2021-06-16 ENCOUNTER — Encounter: Payer: Self-pay | Admitting: Cardiology

## 2021-06-16 VITALS — BP 148/60 | HR 81 | Ht 62.0 in | Wt 160.4 lb

## 2021-06-16 DIAGNOSIS — E782 Mixed hyperlipidemia: Secondary | ICD-10-CM

## 2021-06-16 DIAGNOSIS — I25119 Atherosclerotic heart disease of native coronary artery with unspecified angina pectoris: Secondary | ICD-10-CM

## 2021-06-16 MED ORDER — LOSARTAN POTASSIUM-HCTZ 100-25 MG PO TABS: 1.0000 | ORAL_TABLET | Freq: Every day | ORAL | 3 refills | Status: DC

## 2021-06-16 NOTE — Patient Instructions (Signed)

## 2021-06-16 NOTE — Progress Notes (Signed)
Cardiology Office Note  Date: 06/16/2021   ID: [name], DOB [birth year], MRN [id number]  PCP:  Caryl Bis, MD  Cardiologist:  Rozann Lesches, MD Electrophysiologist:  None   Chief Complaint  Patient presents with   Cardiac follow-up    History of Present Illness: [name] is a 70 y.o. female last seen in October by Mr. Leonides Sake NP.  She presents in follow-up of cardiac catheterization in October which revealed widely patent mid LAD stent site with moderate mid RCA stenosis that was not found to be hemodynamically significant, normal LVEF.  She presents for a routine visit, reports no major change in symptoms.  She has had some abdominal cramping, intermittent dark stools, also intermittent chest pain that is presumably esophageal based on her recent cardiac work-up.  She is scheduled to undergo EGD and colonoscopy with Dr. [name] on Thursday of this week.  I reviewed her cardiac regimen which is otherwise stable and outlined below.  Past Medical History:  Diagnosis Date   Anxiety    Arthritis    Asthma    Barrett esophagus    Chronic bronchitis (HCC)    Chronic diarrhea    Chronic lower back pain    Coronary atherosclerosis of native coronary artery    DES to LAD May 2014   Diverticulosis    Essential hypertension    Fatty liver    Fibromyalgia    GERD (gastroesophageal reflux disease)    H/O hiatal hernia    IBS (irritable bowel syndrome)    Major depression in partial remission ([place])    Migraines    Mixed hyperlipidemia    Myocardial infarction ([place])    2014   OSA on CPAP    Osteoporosis    Pancreatitis 2008   Pneumonia 1990's   Schatzki's ring    Sleep apnea    Type 2 diabetes mellitus ([place])     Past Surgical History:  Procedure Laterality Date   4 HOUR [place] STUDY N/A 02/16/2018   Procedure: 24 HOUR PH STUDY;  Surgeon: Mauri Pole, MD;  Location: [facility];  Service: Endoscopy;  Laterality: N/A;   ABDOMINAL HYSTERECTOMY  ?1987    APPENDECTOMY  ?1987   BIOPSY  03/02/2021   Procedure: BIOPSY;  Surgeon: Lavena Bullion, DO;  Location: [facility];  Service: Gastroenterology;;   BUNIONECTOMY Bilateral [place] Bilateral ?1990   CATARACT EXTRACTION W/ INTRAOCULAR LENS  IMPLANT, BILATERAL Bilateral ?2005   [place]  1990'S   COLONOSCOPY  10/13   Dr. Britta Mccreedy - tubular adenomas and diverticulosis   DILATION AND CURETTAGE OF UTERUS  1970's   "probably 3" (12/09/2012)   ERCP  2008   ESOPHAGEAL DILATION  ` 2012   ESOPHAGEAL MANOMETRY N/A 10/21/2015   Procedure: ESOPHAGEAL MANOMETRY (EM);  Surgeon: Doran Stabler, MD;  Location: [facility];  Service: Gastroenterology;  Laterality: N/A;   ESOPHAGEAL MANOMETRY N/A 02/16/2018   Procedure: ESOPHAGEAL MANOMETRY (EM);  Surgeon: Mauri Pole, MD;  Location: [facility];  Service: Endoscopy;  Laterality: N/A;   ESOPHAGOGASTRODUODENOSCOPY Left 03/02/2021   Procedure: ESOPHAGOGASTRODUODENOSCOPY (EGD);  Surgeon: Lavena Bullion, DO;  Location: [facility] ENDOSCOPY;  Service: Gastroenterology;  Laterality: Left;   HUMERUS FRACTURE SURGERY Right 07/19/1969   "horst threw me" (12/09/2012)   INTRAVASCULAR PRESSURE WIRE/FFR STUDY N/A 05/12/2021   Procedure: INTRAVASCULAR PRESSURE WIRE/FFR STUDY;  Surgeon: Nelva Bush, MD;  Location: [place] [facility];  Service: Cardiovascular;  Laterality: N/A;   LEFT HEART CATH AND CORONARY ANGIOGRAPHY N/A 12/29/2017   Procedure: LEFT HEART CATH AND CORONARY ANGIOGRAPHY;  Surgeon: Wellington Hampshire, MD;  Location: [place] CV LAB;  Service: Cardiovascular;  Laterality: N/A;   LEFT HEART CATH AND CORONARY ANGIOGRAPHY N/A 05/12/2021   Procedure: LEFT HEART CATH AND CORONARY ANGIOGRAPHY;  Surgeon: Nelva Bush, MD;  Location: [place] [facility] LAB;  Service: Cardiovascular;  Laterality: N/A;   NASAL SEPTUM SURGERY  1970's   PERCUTANEOUS CORONARY STENT INTERVENTION (PCI-S) N/A 12/09/2012    Procedure: PERCUTANEOUS CORONARY STENT INTERVENTION (PCI-S);  Surgeon: Burnell Blanks, MD;  Location: [facility] LAB;  Service: Cardiovascular;  Laterality: N/A;   [place] IMPEDANCE STUDY N/A 02/16/2018   Procedure: [place] IMPEDANCE STUDY;  Surgeon: Mauri Pole, MD;  Location: [facility];  Service: Endoscopy;  Laterality: N/A;   TOE FUSION Right 10/11   First MTP joint   TOE SURGERY Right 1990's   "toe next to big toe:  dr said I had tumor; cut bone & stuff; another OR dr stretched tendons, etc" (12/09/2012)   TUBAL LIGATION  1985    Current Outpatient Medications  Medication Sig Dispense Refill   albuterol (PROVENTIL HFA;VENTOLIN HFA) 108 (90 Base) MCG/ACT inhaler Inhale 2 puffs into the lungs every 4 (four) hours as needed.  3   ALPRAZolam (XANAX) 1 MG tablet Take 1 mg by mouth at bedtime.  1   aspirin EC 81 MG tablet Take 81 mg by mouth daily.     atorvastatin (LIPITOR) 40 MG tablet Take 1 tablet (40 mg total) by mouth daily. 90 tablet 3   cetirizine (ZYRTEC) 10 MG tablet Take 10 mg by mouth daily as needed for allergies.     dicyclomine (BENTYL) 10 MG capsule Take 1 capsule (10 mg total) by mouth 3 (three) times daily as needed for spasms. TAKE ONE CAPSULE BY MOUTH TWICE DAILY 20-30 MINUTES BEFORE BREAKFAST AND DINNER 90 capsule 0   diltiazem (CARTIA XT) 120 MG 24 hr capsule Take 1 capsule (120 mg total) by mouth every morning. 90 capsule 3   DULoxetine (CYMBALTA) 60 MG capsule Take 60 mg by mouth daily.     FEROSUL 325 (65 Fe) MG tablet Take 325 mg by mouth daily.     HYDROcodone-acetaminophen (NORCO/VICODIN) 5-325 MG tablet Take 1 tablet by mouth 2 (two) times daily.  0   isosorbide mononitrate (IMDUR) 30 MG 24 hr tablet Take 1 tablet (30 mg total) by mouth daily. 90 tablet 1   metoCLOPramide (REGLAN) 5 MG tablet Take 1 tablet (5 mg total) by mouth every 12 (twelve) hours as needed for up to 2 doses for nausea. Take 30-45 minutes before evening and AM doses of bowel preparation  solution. 2 tablet 0   metoprolol tartrate (LOPRESSOR) 25 MG tablet Take 1 tablet (25 mg total) by mouth 2 (two) times daily. 180 tablet 3   montelukast (SINGULAIR) 10 MG tablet Take 10 mg by mouth at bedtime.      nitroGLYCERIN (NITROSTAT) 0.4 MG SL tablet Place 1 tablet (0.4 mg total) under the tongue every 5 (five) minutes x 3 doses as needed (if no relief after 3rd dose, proceed to the ED for an evaluation or call 911). 25 tablet 3   NOVOLOG FLEXPEN 100 UNIT/ML FlexPen Inject 0-30 Units into the skin in the morning and at bedtime.     omeprazole (PRILOSEC) 40 MG capsule Take 1 capsule (40 mg total) by mouth 2 (  two) times daily. 180 capsule 2   promethazine (PHENERGAN) 25 MG tablet Take 25 mg by mouth at bedtime. Takes prn and at times does 1/2 tablet instead of whole one     traZODone (DESYREL) 50 MG tablet Take 100-150 mg by mouth at bedtime. 4 tabs hs     TRESIBA FLEXTOUCH 200 UNIT/ML FlexTouch Pen Inject 40 Units into the skin daily. As directed     losartan-hydrochlorothiazide (HYZAAR) 100-25 MG tablet Take 1 tablet by mouth daily. 90 tablet 3   No current facility-administered medications for this visit.   Allergies:  Erythromycin base, Azithromycin, Pollen extract, Remeron [mirtazapine], Zolpidem tartrate, Other, Sulfonamide derivatives, and Tree extract   ROS: No palpitations or syncope.  Physical Exam: VS:  BP (!) 148/60   Pulse 81   Ht 5\' 2"  (1.575 m)   Wt 160 lb 6.4 oz (72.8 kg)   SpO2 96%   BMI 29.34 kg/m , BMI Body mass index is 29.34 kg/m.  Wt Readings from Last 3 Encounters:  06/16/21 160 lb 6.4 oz (72.8 kg)  05/30/21 157 lb 3.2 oz (71.3 kg)  05/12/21 155 lb (70.3 kg)    General: Patient appears comfortable at rest. HEENT: Conjunctiva and lids normal, wearing a mask. Neck: Supple, no elevated JVP or carotid bruits, no thyromegaly. Lungs: Clear to auscultation, nonlabored breathing at rest. Cardiac: Regular rate and rhythm, no S3, 2/6 systolic  murmur. Extremities: No pitting edema.  ECG:  An ECG dated 05/12/2021 was personally reviewed today and demonstrated:  Sinus rhythm.  Recent Labwork: 03/08/2021: ALT 15; AST 20 04/15/2021: BUN 11; Creatinine, Ser 0.69; Hemoglobin 12.0; Platelets 215.0; Potassium 4.2; Sodium 136   Other Studies Reviewed Today:  Echocardiogram 03/04/2021:  1. Left ventricular ejection fraction, by estimation, is 60 to 65%. The  left ventricle has normal function. The left ventricle has no regional  wall motion abnormalities. There is mild left ventricular hypertrophy.  Left ventricular diastolic parameters  are consistent with Grade II diastolic dysfunction (pseudonormalization).  Elevated left ventricular end-diastolic pressure.   2. Right ventricular systolic function is normal. The right ventricular  size is normal.   3. The mitral valve is abnormal. Mild mitral valve regurgitation. No  evidence of mitral stenosis.   4. The aortic valve is tricuspid. There is moderate calcification of the  aortic valve. There is moderate thickening of the aortic valve. Aortic  valve regurgitation is not visualized. Mild aortic valve stenosis.   5. The inferior vena cava is normal in size with greater than 50%  respiratory variability, suggesting right atrial pressure of 3 mmHg.   Cardiac catheterization 05/12/2021: Conclusions: Mild-moderate, non-obstructive coronary artery disease.  The most severe stenosis is a 60-70% lesion in the mid RCA that is not hemodynamically significant (RFR = 0.94), though it has worsened since 2019.  There is no severe disease in the LAD territory to explain perfusion defect on recent stress test. Widely patent mid LAD stent. Normal left ventricular contraction (LVEF 55-65%) with mildly elevated filling pressure (LVEDP 20-25 mmHg).   Recommendations: Continue medical therapy for secondary prevention of CAD and antianginal therapy.  Assessment and Plan:  1.  CAD status post DES to the  LAD in 2014 with recent cardiac catheterization showing widely patent stent site and otherwise moderate nonobstructive mid RCA stenosis that is being managed medically.  Plan to continue aspirin, Lopressor, Imdur, Hyzaar, Lipitor, and as needed nitroglycerin.  2.  History of symptomatic anemia.  She has pending EGD and colonoscopy with Dr.  Danis this Thursday.  Medication Adjustments/Labs and Tests Ordered: Current medicines are reviewed at length with the patient today.  Concerns regarding medicines are outlined above.   Tests Ordered: No orders of the defined types were placed in this encounter.   Medication Changes: Meds ordered this encounter  Medications   losartan-hydrochlorothiazide (HYZAAR) 100-25 MG tablet    Sig: Take 1 tablet by mouth daily.    Dispense:  90 tablet    Refill:  3     Disposition:  Follow up  6 months.  Signed, [name], MD, [facility] 06/16/2021 2:12 PM    [facility] [place], [place], [place] [address] Phone: [phone]; Fax: [phone]

## 2021-06-19 ENCOUNTER — Ambulatory Visit (AMBULATORY_SURGERY_CENTER): Payer: Medicare HMO | Admitting: Gastroenterology

## 2021-06-19 ENCOUNTER — Encounter: Payer: Self-pay | Admitting: Gastroenterology

## 2021-06-19 ENCOUNTER — Other Ambulatory Visit: Payer: Self-pay

## 2021-06-19 VITALS — BP 176/69 | HR 62 | Temp 97.9°F | Resp 16 | Ht 62.0 in | Wt 157.0 lb

## 2021-06-19 DIAGNOSIS — K2960 Other gastritis without bleeding: Secondary | ICD-10-CM

## 2021-06-19 DIAGNOSIS — D500 Iron deficiency anemia secondary to blood loss (chronic): Secondary | ICD-10-CM

## 2021-06-19 DIAGNOSIS — K5730 Diverticulosis of large intestine without perforation or abscess without bleeding: Secondary | ICD-10-CM

## 2021-06-19 DIAGNOSIS — R1084 Generalized abdominal pain: Secondary | ICD-10-CM

## 2021-06-19 DIAGNOSIS — R194 Change in bowel habit: Secondary | ICD-10-CM | POA: Diagnosis not present

## 2021-06-19 DIAGNOSIS — I8500 Esophageal varices without bleeding: Secondary | ICD-10-CM | POA: Diagnosis not present

## 2021-06-19 DIAGNOSIS — K259 Gastric ulcer, unspecified as acute or chronic, without hemorrhage or perforation: Secondary | ICD-10-CM

## 2021-06-19 MED ORDER — SODIUM CHLORIDE 0.9 % IV SOLN: 500.0000 mL | Freq: Once | INTRAVENOUS | Status: DC

## 2021-06-19 NOTE — Progress Notes (Signed)
Vital signs checked by:CW ? ?The medical and surgical history was reviewed and verified with the patient. ? ?

## 2021-06-19 NOTE — Progress Notes (Signed)
No changes to clinical history since GI office visit on 05/30/21.  The patient is appropriate for an endoscopic procedure in the ambulatory setting.

## 2021-06-19 NOTE — Progress Notes (Signed)
Approx 1420 pt started given abd pressure an being repostioned to try to get to cecum by Dr/tech. Approx 1426 pt started gupping with bile colored fluid coming out.  HOB immediately dropped to steep t burg.  Suction started. Sedation paused until pt able to follow commands to cough/swallow.  Sats never dropped.  Pt resedated for egd with no issues BS initially clear

## 2021-06-19 NOTE — Progress Notes (Signed)
Patient states the same pain that she came into the facility with.  I told her to call us if it got worse.  She agreed.

## 2021-06-19 NOTE — Op Note (Signed)
[facility] Patient Name: [name] Procedure Date: 06/19/2021 1:57 PM MRN: [id number] Endoscopist: Mallie Mussel L. [name] , MD Age: 70 Referring MD:  Date of Birth: 04-Jun-1951 Gender: Female Account #: [id number] Procedure:                Upper GI endoscopy Indications:              Unexplained iron deficiency anemia, follow up                            finding of ulcerated gastric mucosa on inpatient EGD                           known Grade 1 esophageal varices Medicines:                Monitored Anesthesia Care Procedure:                Pre-Anesthesia Assessment:                           - Prior to the procedure, a History and Physical                            was performed, and patient medications and                            allergies were reviewed. The patient's tolerance of                            previous anesthesia was also reviewed. The risks                            and benefits of the procedure and the sedation                            options and risks were discussed with the patient.                            All questions were answered, and informed consent                            was obtained. Prior Anticoagulants: The patient has                            taken no previous anticoagulant or antiplatelet                            agents except for aspirin. ASA Grade Assessment:                            III - A patient with severe systemic disease. After                            reviewing the risks and benefits, the patient was  deemed in satisfactory condition to undergo the                            procedure.                           After obtaining informed consent, the endoscope was                            passed under direct vision. Throughout the                            procedure, the patient's blood pressure, pulse, and                            oxygen saturations were monitored continuously. The                             #[id number] Olympus Endoscope was introduced through the                            mouth, and advanced to the second part of duodenum.                            The upper GI endoscopy was accomplished without                            difficulty. The patient tolerated the procedure                            well. Scope In: Scope Out: Findings:                 Grade I varices were found in the lower third of                            the esophagus.                           Atrophic mucosa was found in the gastric antrum.                            The previously-described ulcerated gastric mucosa                            was healed.                           The exam of the stomach was otherwise normal.                           The examined duodenum was normal. Complications:            No immediate complications. Estimated Blood Loss:     Estimated blood loss: none. Impression:               - Grade I esophageal varices.                           -  Gastric mucosal atrophy.                           - Normal examined duodenum.                           - No specimens collected. Recommendation:           - Patient has a contact number available for                            emergencies. The signs and symptoms of potential                            delayed complications were discussed with the                            patient. Return to normal activities tomorrow.                            Written discharge instructions were provided to the                            patient.                           - Resume previous diet.                           - Continue present medications.                           - Repeat upper endoscopy in 3 years for variceal                            screening purposes.                           - See the other procedure note for documentation of                            additional recommendations. Tarini Carrier L. [name],  MD 06/19/2021 2:51:59 PM This report has been signed electronically.

## 2021-06-19 NOTE — Op Note (Signed)
[place] Patient Name: [name] Procedure Date: 06/19/2021 1:58 PM MRN: [id number] Endoscopist: Mallie Mussel L. [name] , MD Age: 70 Referring MD:  Date of Birth: Jun 18, 1951 Gender: Female Account #: [id number] Procedure:                Colonoscopy Indications:              Generalized abdominal pain, Unexplained iron                            deficiency anemia, altered bowel habits                            (alternating constipation and diarrhea) Medicines:                Monitored Anesthesia Care Procedure:                Pre-Anesthesia Assessment:                           - Prior to the procedure, a History and Physical                            was performed, and patient medications and                            allergies were reviewed. The patient's tolerance of                            previous anesthesia was also reviewed. The risks                            and benefits of the procedure and the sedation                            options and risks were discussed with the patient.                            All questions were answered, and informed consent                            was obtained. Prior Anticoagulants: The patient has                            taken no previous anticoagulant or antiplatelet                            agents. ASA Grade Assessment: III - A patient with                            severe systemic disease. After reviewing the risks                            and benefits, the patient was deemed in  satisfactory condition to undergo the procedure.                           After obtaining informed consent, the colonoscope                            was passed under direct vision. Throughout the                            procedure, the patient's blood pressure, pulse, and                            oxygen saturations were monitored continuously. The                            Olympus [id number]-[id number] (#[id number])  Colonoscope was                            introduced through the anus and advanced to the the                            sigmoid colon. The colonoscopy was extremely                            difficult due to multiple diverticula in the colon,                            a redundant colon and a tortuous colon. Patient                            repositioned, changed from adult to pediatric                            colonoscope, manual pressure, water inflation all                            employed, but scope could not be advanced beyond                            the distal sigmoid colon. Scope In: Scope Out: Findings:                 The digital rectal exam findings include decreased                            sphincter tone.                           Many diverticula were found in the sigmoid colon.                            There was associated tortuosity and redundancy                            precluding scope passage to the more  proximal colon                            (see above).                           The exam was otherwise without abnormality. Complications:            No immediate complications. Estimated Blood Loss:     Estimated blood loss: none. Impression:               - Decreased sphincter tone found on digital rectal                            exam.                           - Diverticulosis in the sigmoid colon.                           - The examination was otherwise normal.                           - No specimens collected. Recommendation:           - Patient has a contact number available for                            emergencies. The signs and symptoms of potential                            delayed complications were discussed with the                            patient. Return to normal activities tomorrow.                            Written discharge instructions were provided to the                            patient.                           -  Resume previous diet.                           - Continue present medications.                           - Repeat colonoscopy attempt with advanced                            endoscopist.                           - See the other procedure note for documentation of                            additional recommendations.  [name] L. [name], MD 06/19/2021 2:47:19 PM This report has been signed electronically.

## 2021-06-19 NOTE — Progress Notes (Signed)
Report to PACU, RN, vss, BBS= Clear.  

## 2021-06-19 NOTE — Patient Instructions (Addendum)
Read the handouts given to you by your recovery room nurse.  Nothing to drink after midnight the night before your gastric emptying test..  The office will call  you to set up further testing discussed by Dr. [name].  YOU HAD AN ENDOSCOPIC PROCEDURE TODAY AT [facility]:   Refer to the procedure report that was given to you for any specific questions about what was found during the examination.  If the procedure report does not answer your questions, please call your gastroenterologist to clarify.  If you requested that your care partner not be given the details of your procedure findings, then the procedure report has been included in a sealed envelope for you to review at your convenience later.  YOU SHOULD EXPECT: Some feelings of bloating in the abdomen. Passage of more gas than usual.  Walking can help get rid of the air that was put into your GI tract during the procedure and reduce the bloating. If you had a lower endoscopy (such as a colonoscopy or flexible sigmoidoscopy) you may notice spotting of blood in your stool or on the toilet paper. If you underwent a bowel prep for your procedure, you may not have a normal bowel movement for a few days.  Please Note:  You might notice some irritation and congestion in your nose or some drainage.  This is from the oxygen used during your procedure.  There is no need for concern and it should clear up in a day or so.  SYMPTOMS TO REPORT IMMEDIATELY:  Following lower endoscopy (colonoscopy or flexible sigmoidoscopy):  Excessive amounts of blood in the stool  Significant tenderness or worsening of abdominal pains  Swelling of the abdomen that is new, acute  Fever of 100F or higher  Following upper endoscopy (EGD)  Vomiting of blood or coffee ground material  New chest pain or pain under the shoulder blades  Painful or persistently difficult swallowing  New shortness of breath  Fever of 100F or higher  Black, tarry-looking  stools  For urgent or emergent issues, a gastroenterologist can be reached at any hour by calling [phone]. Do not use MyChart messaging for urgent concerns.    DIET:  We do recommend a small meal at first, but then you may proceed to your regular diet.  Drink plenty of fluids but you should avoid alcoholic beverages for 24 hours.  ACTIVITY:  You should plan to take it easy for the rest of today and you should NOT DRIVE or use heavy machinery until tomorrow (because of the sedation medicines used during the test).    FOLLOW UP: Our staff will call the number listed on your records 48-72 hours following your procedure to check on you and address any questions or concerns that you may have regarding the information given to you following your procedure. If we do not reach you, we will leave a message.  We will attempt to reach you two times.  During this call, we will ask if you have developed any symptoms of COVID 19. If you develop any symptoms (ie: fever, flu-like symptoms, shortness of breath, cough etc.) before then, please call [phone].  If you test positive for Covid 19 in the 2 weeks post procedure, please call and report this information to [place].    If any biopsies were taken you will be contacted by phone or by letter within the next 1-3 weeks.  Please call us at [phone] if you have not heard about  the biopsies in 3 weeks.    SIGNATURES/CONFIDENTIALITY: You and/or your care partner have signed paperwork which will be entered into your electronic medical record.  These signatures attest to the fact that that the information above on your After Visit Summary has been reviewed and is understood.  Full responsibility of the confidentiality of this discharge information lies with you and/or your care-partner.

## 2021-06-20 ENCOUNTER — Telehealth: Payer: Self-pay

## 2021-06-20 ENCOUNTER — Other Ambulatory Visit: Payer: Self-pay

## 2021-06-20 DIAGNOSIS — D500 Iron deficiency anemia secondary to blood loss (chronic): Secondary | ICD-10-CM

## 2021-06-20 MED ORDER — PEG 3350-KCL-NA BICARB-NACL 420 G PO SOLR: 4000.0000 mL | Freq: Once | ORAL | 0 refills | Status: AC

## 2021-06-20 NOTE — Telephone Encounter (Signed)
Colon has been scheduled for 08/28/21 at 730 am at [facility].  Prep has been sent to the pharmacy. Instructions have been sent to the pt home and my chart.   Colon scheduled, pt instructed and medications reviewed.  Patient instructions mailed to home.  Patient to call with any questions or concerns.

## 2021-06-20 NOTE — Telephone Encounter (Signed)
-----   Message from Irving Copas., MD sent at 06/19/2021  5:21 PM EST ----- Cordarrius Coad, Please move forward with scheduling colonoscopy (ultraslim available) in hospital next available with me.  Thanks. GM ----- Message ----- From: Doran Stabler, MD Sent: 06/19/2021   5:10 PM EST To: Timothy Lasso, RN, Irving Copas., MD  Thanks, Chester Holstein.  Yes it can wait until then, her hemoglobin has risen nicely on iron.  Some recent inpatient EGD findings may have at least partially accounted for the anemia, which are now resolved on today's EGD.  Just want to be certain we are not missing a lower GI source of IDA.  She also has longstanding IBS with alternating bowel habits.  She is agreeable to the procedure, so I appreciate Taraoluwa Thakur helping make the arrangements.  HD

## 2021-06-23 ENCOUNTER — Telehealth: Payer: Self-pay

## 2021-06-23 ENCOUNTER — Telehealth: Payer: Self-pay | Admitting: *Deleted

## 2021-06-23 ENCOUNTER — Other Ambulatory Visit: Payer: Self-pay

## 2021-06-23 ENCOUNTER — Ambulatory Visit (HOSPITAL_COMMUNITY)
Admission: RE | Admit: 2021-06-23 | Discharge: 2021-06-23 | Disposition: A | Discharge status: Home / Self Care | Payer: Medicare HMO | Source: Ambulatory Visit | Attending: Gastroenterology | Admitting: Gastroenterology

## 2021-06-23 DIAGNOSIS — R112 Nausea with vomiting, unspecified: Secondary | ICD-10-CM | POA: Diagnosis present

## 2021-06-23 DIAGNOSIS — R1084 Generalized abdominal pain: Secondary | ICD-10-CM | POA: Insufficient documentation

## 2021-06-23 DIAGNOSIS — D500 Iron deficiency anemia secondary to blood loss (chronic): Secondary | ICD-10-CM | POA: Insufficient documentation

## 2021-06-23 MED ORDER — TECHNETIUM TC 99M SULFUR COLLOID: 2.4600 | Freq: Once | INTRAVENOUS | Status: DC | PRN

## 2021-06-23 MED ORDER — TECHNETIUM TC 99M SULFUR COLLOID
2.4600 | Freq: Once | INTRAVENOUS | Status: AC | PRN
  Administered 2021-06-23: 2.46 via ORAL

## 2021-06-23 NOTE — Telephone Encounter (Signed)
  Follow up Call-  Call back number 06/19/2021 05/29/2019  Post procedure Call Back phone  # [phone]  Permission to leave phone message Yes No  Some recent data might be hidden     Patient questions:  Do you have a fever, pain , or abdominal swelling? No. Pain Score  0 *  Have you tolerated food without any problems? Yes.    Have you been able to return to your normal activities? Yes.    Do you have any questions about your discharge instructions: Diet   No. Medications  No. Follow up visit  No.  Do you have questions or concerns about your Care? No.  Actions: * If pain score is 4 or above: No action needed, pain <4.  Have you developed a fever since your procedure? no  2.   Have you had an respiratory symptoms (SOB or cough) since your procedure? no  3.   Have you tested positive for COVID 19 since your procedure no  4.   Have you had any family members/close contacts diagnosed with the COVID 19 since your procedure?  no   If yes to any of these questions please route to Joylene John, RN and Joella Prince, RN

## 2021-06-23 NOTE — Telephone Encounter (Signed)
No answer, left message to call back later today, B.Zekiah Coen RN. 

## 2021-07-02 ENCOUNTER — Other Ambulatory Visit: Payer: Self-pay | Admitting: Gastroenterology

## 2021-07-02 MED ORDER — ONDANSETRON HCL 4 MG PO TABS: 4.0000 mg | ORAL_TABLET | Freq: Three times a day (TID) | ORAL | 2 refills | Status: DC | PRN

## 2021-08-11 ENCOUNTER — Telehealth: Payer: Self-pay

## 2021-08-11 MED ORDER — ISOSORBIDE MONONITRATE ER 30 MG PO TB24: 30.0000 mg | ORAL_TABLET | Freq: Every day | ORAL | 1 refills | Status: DC

## 2021-08-11 NOTE — Telephone Encounter (Signed)
Medication refill request approved for Imdur ER30 mg tablets and sent to [facility] per pt request.

## 2021-08-20 ENCOUNTER — Encounter (HOSPITAL_COMMUNITY): Payer: Self-pay | Admitting: Gastroenterology

## 2021-08-20 NOTE — Progress Notes (Signed)
Attempted to obtain medical history via telephone, unable to reach at this time. I left a voicemail to return pre surgical testing department's phone call.  

## 2021-08-25 ENCOUNTER — Telehealth: Payer: Self-pay | Admitting: Gastroenterology

## 2021-08-25 MED ORDER — METOCLOPRAMIDE HCL 5 MG PO TABS: ORAL_TABLET | ORAL | 0 refills | Status: DC

## 2021-08-25 NOTE — Telephone Encounter (Signed)
Patient called and stated that Dr. [name]  had given her some medication for her when she had her last procedure to hep with her getting sick. Patient is schedule for procedure on 2/9 at [facility] and is seeking advice if she can have that medication again. Please advise.

## 2021-08-25 NOTE — Telephone Encounter (Signed)
Returned call to patient. She is requesting a refill of Reglan prior to her procedure at [facility] on 08/28/21 with Dr. Rush Landmark. She is aware that she will take medication prior to her prep. She would like refill sent to pharmacy on file. Pt had no other concerns at the end of the call.

## 2021-08-27 NOTE — Anesthesia Preprocedure Evaluation (Addendum)
Anesthesia Evaluation  Patient identified by MRN, date of birth, ID band Patient awake    Reviewed: Allergy & Precautions, NPO status , Patient's Chart, lab work & pertinent test results, reviewed documented beta blocker date and time   History of Anesthesia Complications Negative for: history of anesthetic complications  Airway Mallampati: II  TM Distance: >3 FB Neck ROM: Full    Dental  (+) Edentulous Upper, Missing, Dental Advisory Given   Pulmonary asthma , sleep apnea , pneumonia, former smoker,    Pulmonary exam normal breath sounds clear to auscultation       Cardiovascular hypertension, Pt. on medications and Pt. on home beta blockers + angina with exertion + CAD, + Past MI and + Cardiac Stents  Normal cardiovascular exam+ Valvular Problems/Murmurs AS and MR  Rhythm:Regular Rate:Normal  Echo 02/2021 1. Left ventricular ejection fraction, by estimation, is 60 to 65%. The left ventricle has normal function. The left ventricle has no regional wall motion abnormalities. There is mild left ventricular hypertrophy. Left ventricular diastolic parameters are consistent with Grade II diastolic dysfunction (pseudonormalization). Elevated left ventricular end-diastolic pressure.  2. Right ventricular systolic function is normal. The right ventricular size is normal.  3. The mitral valve is abnormal. Mild mitral valve regurgitation. No evidence of mitral stenosis.  4. The aortic valve is tricuspid. There is moderate calcification of the aortic valve. There is moderate thickening of the aortic valve. Aortic valve regurgitation is not visualized. Mild aortic valve stenosis.  5. The inferior vena cava is normal in size with greater than 50% respiratory variability, suggesting right atrial pressure of 3 mmHg.     ? The left ventricular systolic function is normal. ? LV end diastolic pressure is mildly elevated. ? The left ventricular  ejection fraction is 55-65% by visual estimate. ? Mid RCA lesion is 40% stenosed. ? Prox Cx lesion is 30% stenosed. ? Ost 1st Diag lesion is 40% stenosed. ? Previously placed Mid LAD stent (unknown type) is widely patent.   1.  Widely patent mid LAD stent with no restenosis.  Mild to moderate nonobstructive disease affecting the RCA and left circumflex. 2.  Normal LV systolic function and mildly elevated left ventricular end-diastolic pressure.    Neuro/Psych  Headaches, PSYCHIATRIC DISORDERS Anxiety Depression  Neuromuscular disease    GI/Hepatic hiatal hernia, GERD  Medicated,? Gi bleed   Endo/Other  diabetes  Renal/GU negative Renal ROSLab Results      Component                Value               Date                      CREATININE               0.67                03/02/2021                Musculoskeletal  (+) Arthritis , Fibromyalgia -  Abdominal   Peds  Hematology  (+) Blood dyscrasia, anemia , Lab Results      Component                Value               Date                      WBC  17.5 (H)            03/02/2021                HGB                      9.6 (L)             03/02/2021                HCT                      31.2 (L)            03/02/2021                MCV                      82.1                03/02/2021                PLT                      305                 03/02/2021              Anesthesia Other Findings Symptoms concerning for unstable angina with ntg use at home. No angina today. Discussed with GI who will get Cards to f/u with patient in house  Reproductive/Obstetrics                           Anesthesia Physical  Anesthesia Plan  ASA: 4  Anesthesia Plan: General   Post-op Pain Management: Minimal or no pain anticipated   Induction: Intravenous, Rapid sequence and Cricoid pressure planned  PONV Risk Score and Plan: 3 and Treatment may vary due to age or medical condition,  Ondansetron and Dexamethasone  Airway Management Planned: Oral ETT  Additional Equipment: None  Intra-op Plan:   Post-operative Plan:   Informed Consent: I have reviewed the patients History and Physical, chart, labs and discussed the procedure including the risks, benefits and alternatives for the proposed anesthesia with the patient or authorized representative who has indicated his/her understanding and acceptance.     Dental advisory given  Plan Discussed with: CRNA  Anesthesia Plan Comments:      Anesthesia Quick Evaluation

## 2021-08-28 ENCOUNTER — Ambulatory Visit (HOSPITAL_BASED_OUTPATIENT_CLINIC_OR_DEPARTMENT_OTHER): Payer: Medicare HMO | Admitting: Anesthesiology

## 2021-08-28 ENCOUNTER — Encounter (HOSPITAL_COMMUNITY): Payer: Self-pay | Admitting: Gastroenterology

## 2021-08-28 ENCOUNTER — Ambulatory Visit (HOSPITAL_COMMUNITY): Payer: Medicare HMO | Admitting: Anesthesiology

## 2021-08-28 ENCOUNTER — Ambulatory Visit (HOSPITAL_COMMUNITY)
Admission: RE | Admit: 2021-08-28 | Discharge: 2021-08-28 | Disposition: A | Discharge status: Home / Self Care | Payer: Medicare HMO | Source: Ambulatory Visit | Attending: Gastroenterology | Admitting: Gastroenterology

## 2021-08-28 ENCOUNTER — Other Ambulatory Visit: Payer: Self-pay

## 2021-08-28 ENCOUNTER — Encounter (HOSPITAL_COMMUNITY)
Admission: RE | Disposition: A | Discharge status: Home / Self Care | Payer: Self-pay | Source: Ambulatory Visit | Attending: Gastroenterology

## 2021-08-28 DIAGNOSIS — R194 Change in bowel habit: Secondary | ICD-10-CM | POA: Diagnosis not present

## 2021-08-28 DIAGNOSIS — D509 Iron deficiency anemia, unspecified: Secondary | ICD-10-CM

## 2021-08-28 DIAGNOSIS — G4733 Obstructive sleep apnea (adult) (pediatric): Secondary | ICD-10-CM | POA: Diagnosis not present

## 2021-08-28 DIAGNOSIS — E119 Type 2 diabetes mellitus without complications: Secondary | ICD-10-CM | POA: Insufficient documentation

## 2021-08-28 DIAGNOSIS — I2510 Atherosclerotic heart disease of native coronary artery without angina pectoris: Secondary | ICD-10-CM | POA: Insufficient documentation

## 2021-08-28 DIAGNOSIS — G4730 Sleep apnea, unspecified: Secondary | ICD-10-CM

## 2021-08-28 DIAGNOSIS — Q438 Other specified congenital malformations of intestine: Secondary | ICD-10-CM

## 2021-08-28 DIAGNOSIS — Z955 Presence of coronary angioplasty implant and graft: Secondary | ICD-10-CM | POA: Insufficient documentation

## 2021-08-28 DIAGNOSIS — K641 Second degree hemorrhoids: Secondary | ICD-10-CM | POA: Diagnosis not present

## 2021-08-28 DIAGNOSIS — I10 Essential (primary) hypertension: Secondary | ICD-10-CM | POA: Diagnosis not present

## 2021-08-28 DIAGNOSIS — K5730 Diverticulosis of large intestine without perforation or abscess without bleeding: Secondary | ICD-10-CM

## 2021-08-28 DIAGNOSIS — R1084 Generalized abdominal pain: Secondary | ICD-10-CM | POA: Insufficient documentation

## 2021-08-28 DIAGNOSIS — K649 Unspecified hemorrhoids: Secondary | ICD-10-CM

## 2021-08-28 DIAGNOSIS — K644 Residual hemorrhoidal skin tags: Secondary | ICD-10-CM | POA: Diagnosis not present

## 2021-08-28 DIAGNOSIS — I252 Old myocardial infarction: Secondary | ICD-10-CM | POA: Insufficient documentation

## 2021-08-28 DIAGNOSIS — Z87891 Personal history of nicotine dependence: Secondary | ICD-10-CM | POA: Diagnosis not present

## 2021-08-28 DIAGNOSIS — J449 Chronic obstructive pulmonary disease, unspecified: Secondary | ICD-10-CM | POA: Insufficient documentation

## 2021-08-28 DIAGNOSIS — D500 Iron deficiency anemia secondary to blood loss (chronic): Secondary | ICD-10-CM

## 2021-08-28 HISTORY — PX: COLONOSCOPY WITH PROPOFOL: SHX5780

## 2021-08-28 LAB — GLUCOSE, CAPILLARY: Glucose-Capillary: 154 mg/dL — ABNORMAL HIGH (ref 70–99)

## 2021-08-28 SURGERY — COLONOSCOPY WITH PROPOFOL
Anesthesia: General

## 2021-08-28 MED ORDER — PHENYLEPHRINE 40 MCG/ML (10ML) SYRINGE FOR IV PUSH (FOR BLOOD PRESSURE SUPPORT)
PREFILLED_SYRINGE | INTRAVENOUS | Status: DC | PRN
  Administered 2021-08-28: 80 ug via INTRAVENOUS

## 2021-08-28 MED ORDER — ONDANSETRON HCL 4 MG/2ML IJ SOLN
INTRAMUSCULAR | Status: DC | PRN
  Administered 2021-08-28: 4 mg via INTRAVENOUS

## 2021-08-28 MED ORDER — SUCCINYLCHOLINE CHLORIDE 200 MG/10ML IV SOSY
PREFILLED_SYRINGE | INTRAVENOUS | Status: DC | PRN
  Administered 2021-08-28: 80 mg via INTRAVENOUS

## 2021-08-28 MED ORDER — PROPOFOL 10 MG/ML IV BOLUS
INTRAVENOUS | Status: DC | PRN
  Administered 2021-08-28: 30 mg via INTRAVENOUS
  Administered 2021-08-28: 20 mg via INTRAVENOUS
  Administered 2021-08-28: 110 mg via INTRAVENOUS

## 2021-08-28 MED ORDER — SODIUM CHLORIDE 0.9 % IV SOLN: INTRAVENOUS | Status: DC

## 2021-08-28 MED ORDER — LACTATED RINGERS IV SOLN: INTRAVENOUS | Status: DC

## 2021-08-28 MED ORDER — LIDOCAINE 2% (20 MG/ML) 5 ML SYRINGE
INTRAMUSCULAR | Status: DC | PRN
  Administered 2021-08-28: 50 mg via INTRAVENOUS

## 2021-08-28 SURGICAL SUPPLY — 19 items
ELECT REM PT RETURN 9FT ADLT (ELECTROSURGICAL)
FCP BXJMBJMB 240X2.8X (CUTTING FORCEPS)
FLOOR PAD 36X40 (MISCELLANEOUS) ×2
FORCEPS BIOP RAD 4 LRG CAP 4 (CUTTING FORCEPS)
FORCEPS BIOP RJ4 240 W/NDL (CUTTING FORCEPS)
INJECTOR/SNARE I SNARE (MISCELLANEOUS)
LUBRICANT JELLY 4.5OZ STERILE (MISCELLANEOUS)
MANIFOLD NEPTUNE II (INSTRUMENTS)
NDL SCLEROTHERAPY 25GX240 (NEEDLE) IMPLANT
NEEDLE SCLEROTHERAPY 25GX240 (NEEDLE)
PROBE APC STR FIRE (PROBE)
PROBE INJECTION GOLD (MISCELLANEOUS)
PROBE INJECTION GOLD 7FR (MISCELLANEOUS)
SNARE ROTATE MED OVAL 20MM (MISCELLANEOUS)
SYR 50ML LL SCALE MARK (SYRINGE)
TRAP SPECIMEN MUCOUS 40CC (MISCELLANEOUS)
TUBING ENDO SMARTCAP PENTAX (MISCELLANEOUS)
TUBING IRRIGATION ENDOGATOR (MISCELLANEOUS) ×2
WATER STERILE IRR 1000ML POUR (IV SOLUTION)

## 2021-08-28 NOTE — Transfer of Care (Signed)
Immediate Anesthesia Transfer of Care Note  Patient: [name]  Procedure(s) Performed: COLONOSCOPY WITH PROPOFOL  Patient Location: PACU  Anesthesia Type:General  Level of Consciousness: awake, alert  and oriented  Airway & Oxygen Therapy: Patient Spontanous Breathing  Post-op Assessment: Report given to RN and Post -op Vital signs reviewed and stable  Post vital signs: Reviewed and stable  Last Vitals:  Vitals Value Taken Time  BP 153/82 08/28/21 0838  Temp 37.1 C 08/28/21 0838  Pulse 66 08/28/21 0839  Resp 18 08/28/21 0839  SpO2 94 % 08/28/21 0839  Vitals shown include unvalidated device data.  Last Pain:  Vitals:   08/28/21 0714  TempSrc: Temporal         Complications: No notable events documented.

## 2021-08-28 NOTE — Op Note (Signed)
[facility] Patient Name: [name] Procedure Date : 08/28/2021 MRN: [id number] Attending MD: Justice Britain , MD Date of Birth: 1951/01/02 CSN: [id number] Age: 71 Admit Type: Outpatient Procedure:                Colonoscopy Indications:              Generalized abdominal pain, Iron deficiency anemia,                            Change in bowel habits, Prior failed Colonoscopy                            attempt Providers:                Justice Britain, MD, Jeanella Cara, RN,                            Cletis Athens, Technician Referring MD:             Estill Cotta. [name], MD, Mitzie Na Quillian Quince MD, MD Medicines:                General Anesthesia (decision per Anesthesia staff) Complications:            No immediate complications. Estimated Blood Loss:     Estimated blood loss: none. Procedure:                Pre-Anesthesia Assessment:                           - Prior to the procedure, a History and Physical                            was performed, and patient medications and                            allergies were reviewed. The patient's tolerance of                            previous anesthesia was also reviewed. The risks                            and benefits of the procedure and the sedation                            options and risks were discussed with the patient.                            All questions were answered, and informed consent                            was obtained. Prior Anticoagulants: The patient has                            taken no previous anticoagulant or antiplatelet  agents except for aspirin. ASA Grade Assessment:                            III - A patient with severe systemic disease. After                            reviewing the risks and benefits, the patient was                            deemed in satisfactory condition to undergo the                            procedure.                            After obtaining informed consent, the colonoscope                            was passed under direct vision. Throughout the                            procedure, the patient's blood pressure, pulse, and                            oxygen saturations were monitored continuously. The                            PCF-H190TL ([id number] Olympus ultraslim colonoscope                            was introduced through the anus and advanced to the                            terminal ileum. The colonoscopy was somewhat                            difficult due to a tortuous colon. Successful                            completion of the procedure was aided by using an                            abdominal binder, changing the patient's position,                            significant pressure in the suprapubic region,                            straightening and shortening the scope to obtain                            bowel loop reduction and using scope torsion (plus  using the ultraslim colonoscope) and complete water                            immersion. The patient tolerated the procedure. The                            quality of the bowel preparation was adequate. The                            terminal ileum, ileocecal valve, appendiceal                            orifice, and rectum were photographed. Scope In: 8:04:13 AM Scope Out: 8:24:53 AM Scope Withdrawal Time: 0 hours 12 minutes 27 seconds  Total Procedure Duration: 0 hours 20 minutes 40 seconds  Findings:      The digital rectal exam findings included large external hemorrhoids.       Pertinent negatives include no palpable rectal lesions.      The terminal ileum and ileocecal valve appeared normal.      The recto-sigmoid colon, sigmoid colon and descending colon were       significantly tortuous.      Many small-mouthed diverticula were found in the recto-sigmoid colon,       sigmoid colon and descending  colon.      Normal mucosa was found in the entire colon.      Non-bleeding non-thrombosed external and internal hemorrhoids were found       during retroflexion, during perianal exam and during digital exam. The       hemorrhoids were Grade II (internal hemorrhoids that prolapse but reduce       spontaneously). Impression:               - Hemorrhoids found on digital rectal exam.                           - The examined portion of the ileum was normal.                           - Tortuous left colon.                           - Diverticulosis in the recto-sigmoid colon, in the                            sigmoid colon and in the descending colon.                           - Normal mucosa in the entire examined colon.                           - Non-bleeding non-thrombosed external and internal                            hemorrhoids. Recommendation:           - The patient will be observed post-procedure,  until all discharge criteria are met.                           - Discharge patient to home.                           - Patient has a contact number available for                            emergencies. The signs and symptoms of potential                            delayed complications were discussed with the                            patient. Return to normal activities tomorrow.                            Written discharge instructions were provided to the                            patient.                           - High fiber diet.                           - Use FiberCon 1-2 tablets PO daily.                           - Continue present medications.                           - Consideration of repeat colonoscopy in 10 years                            for screening purposes if patient is healthy enough                            could be considered, though would need to be seen                            in clinic, likely this will be her last  procedure.                           - Further workup/evaluation of patient's symptoms                            as well as IDA as per primary GI team. Return to GI                            clinic will be arranged.                           - The findings and  recommendations were discussed                            with the patient.                           - The findings and recommendations were discussed                            with the patient's family. Procedure Code(s):        --- Professional ---                           [phone], Colonoscopy, flexible; diagnostic, including                            collection of specimen(s) by brushing or washing,                            when performed (separate procedure) Diagnosis Code(s):        --- Professional ---                           K64.1, Second degree hemorrhoids                           R10.84, Generalized abdominal pain                           D50.9, Iron deficiency anemia, unspecified                           R19.4, Change in bowel habit                           K57.30, Diverticulosis of large intestine without                            perforation or abscess without bleeding                           Q43.8, Other specified congenital malformations of                            intestine CPT copyright 2019 American Medical Association. All rights reserved. The codes documented in this report are preliminary and upon coder review may  be revised to meet current compliance requirements. Justice Britain, MD 08/28/2021 8:36:37 AM Number of Addenda: 0

## 2021-08-28 NOTE — H&P (Signed)
GASTROENTEROLOGY PROCEDURE H&P NOTE   Primary Care Physician: Caryl Bis, MD  HPI: [name] is a 71 y.o. female who presents for Colonoscopy for repeat attempt at evaluation after failed attempt in 2022.  History of IDA and concern for other issues within colon that need evaluation.  Severe diverticular disease and spasm and tortuosity has led to this.  Past Medical History:  Diagnosis Date   Anxiety    Arthritis    Asthma    Barrett esophagus    Chronic bronchitis (HCC)    Chronic diarrhea    Chronic lower back pain    Coronary atherosclerosis of native coronary artery    DES to LAD May 2014   Diverticulosis    Essential hypertension    Fatty liver    Fibromyalgia    GERD (gastroesophageal reflux disease)    H/O hiatal hernia    IBS (irritable bowel syndrome)    Major depression in partial remission ([place])    Migraines    Mixed hyperlipidemia    Myocardial infarction ([place])    2014   OSA on CPAP    Osteoporosis    Pancreatitis 2008   Pneumonia 1990's   Schatzki's ring    Sleep apnea    Type 2 diabetes mellitus ([place])    Past Surgical History:  Procedure Laterality Date   64 HOUR [place] STUDY N/A 02/16/2018   Procedure: 24 HOUR PH STUDY;  Surgeon: Mauri Pole, MD;  Location: [facility];  Service: Endoscopy;  Laterality: N/A;   ABDOMINAL HYSTERECTOMY  ?1987   APPENDECTOMY  ?1987   BIOPSY  03/02/2021   Procedure: BIOPSY;  Surgeon: Lavena Bullion, DO;  Location: [facility];  Service: Gastroenterology;;   BUNIONECTOMY Bilateral [place] Bilateral ?1990   CATARACT EXTRACTION W/ INTRAOCULAR LENS  IMPLANT, BILATERAL Bilateral ?2005   [place]  1990'S   COLONOSCOPY  10/13   Dr. Britta Mccreedy - tubular adenomas and diverticulosis   DILATION AND CURETTAGE OF UTERUS  1970's   "probably 3" (12/09/2012)   ERCP  2008   ESOPHAGEAL DILATION  ` 2012   ESOPHAGEAL MANOMETRY N/A 10/21/2015   Procedure: ESOPHAGEAL  MANOMETRY (EM);  Surgeon: Doran Stabler, MD;  Location: [facility];  Service: Gastroenterology;  Laterality: N/A;   ESOPHAGEAL MANOMETRY N/A 02/16/2018   Procedure: ESOPHAGEAL MANOMETRY (EM);  Surgeon: Mauri Pole, MD;  Location: [facility];  Service: Endoscopy;  Laterality: N/A;   ESOPHAGOGASTRODUODENOSCOPY Left 03/02/2021   Procedure: ESOPHAGOGASTRODUODENOSCOPY (EGD);  Surgeon: Lavena Bullion, DO;  Location: [facility] ENDOSCOPY;  Service: Gastroenterology;  Laterality: Left;   HUMERUS FRACTURE SURGERY Right 07/19/1969   "horst threw me" (12/09/2012)   INTRAVASCULAR PRESSURE WIRE/FFR STUDY N/A 05/12/2021   Procedure: INTRAVASCULAR PRESSURE WIRE/FFR STUDY;  Surgeon: Nelva Bush, MD;  Location: [place] CV LAB;  Service: Cardiovascular;  Laterality: N/A;   LEFT HEART CATH AND CORONARY ANGIOGRAPHY N/A 12/29/2017   Procedure: LEFT HEART CATH AND CORONARY ANGIOGRAPHY;  Surgeon: Wellington Hampshire, MD;  Location: [place] [facility];  Service: Cardiovascular;  Laterality: N/A;   LEFT HEART CATH AND CORONARY ANGIOGRAPHY N/A 05/12/2021   Procedure: LEFT HEART CATH AND CORONARY ANGIOGRAPHY;  Surgeon: Nelva Bush, MD;  Location: [place] [facility];  Service: Cardiovascular;  Laterality: N/A;   NASAL SEPTUM SURGERY  1970's   PERCUTANEOUS CORONARY STENT INTERVENTION (PCI-S) N/A 12/09/2012   Procedure: PERCUTANEOUS CORONARY STENT INTERVENTION (PCI-S);  Surgeon: Harrell Gave  Santina Evans, MD;  Location: [facility];  Service: Cardiovascular;  Laterality: N/A;   [place] IMPEDANCE STUDY N/A 02/16/2018   Procedure: [place] IMPEDANCE STUDY;  Surgeon: Mauri Pole, MD;  Location: [facility];  Service: Endoscopy;  Laterality: N/A;   TOE FUSION Right 10/11   First MTP joint   TOE SURGERY Right 1990's   "toe next to big toe:  dr said I had tumor; cut bone & stuff; another OR dr stretched tendons, etc" (12/09/2012)   TUBAL LIGATION  1985   Current Facility-Administered Medications  Medication Dose  Route Frequency Provider Last Rate Last Admin   0.9 %  sodium chloride infusion   Intravenous Continuous Mansouraty, [name], MD       lactated ringers infusion   Intravenous Continuous Mansouraty, [name], MD        Current Facility-Administered Medications:    0.9 %  sodium chloride infusion, , Intravenous, Continuous, Mansouraty, [name], MD   lactated ringers infusion, , Intravenous, Continuous, Mansouraty, [name], MD Allergies  Allergen Reactions   Erythromycin Base Diarrhea   Azithromycin Other (See Comments)    Upset stomach    Pollen Extract Cough    Runny nose   Remeron [Mirtazapine] Other (See Comments)    Makes her too sleepy   Zolpidem Tartrate Other (See Comments)    amnesia   Other Rash and Other (See Comments)    Grass, dust, green beans, corn, cabbage Reaction: upset stomach   Sulfonamide Derivatives Rash    Mouth sores   Tree Extract Rash   Family History  Problem Relation Age of Onset   Stroke Mother    Cirrhosis Father    Hypertension Father    Esophageal cancer Sister    Colon polyps Sister        pre-cancerous, part of colon removed   Prostate cancer Brother    Colon cancer Neg Hx    Rectal cancer Neg Hx    Stomach cancer Neg Hx    Social History   Socioeconomic History   Marital status: Married    Spouse name: [name]   Number of children: 2   Years of education: Not on file   Highest education level: Not on file  Occupational History   Occupation: Retired  [employer]  Tobacco Use   Smoking status: Former    Packs/day: 0.80    Years: 15.00    Pack years: 12.00    Types: Cigarettes    Start date: 07/20/1976    Quit date: 07/20/1993    Years since quitting: 28.1   Smokeless tobacco: Never  Vaping Use   Vaping Use: Never used  Substance and Sexual Activity   Alcohol use: No    Alcohol/week: 0.0 standard drinks   Drug use: No   Sexual activity: Yes  Other Topics Concern   Not on file  Social History Narrative   Not on file    Social Determinants of Health   Financial Resource Strain: Not on file  Food Insecurity: Not on file  Transportation Needs: Not on file  Physical Activity: Not on file  Stress: Not on file  Social Connections: Not on file  Intimate Partner Violence: Not on file    Physical Exam: Today's Vitals   08/28/21 0714  BP: (!) 175/45  Pulse: 63  Resp: 15  Temp: (!) 97.4 F (36.3 C)  TempSrc: Temporal  SpO2: 94%  Weight: 70.3 kg  Height: 5\' 2"  (1.575 m)   Body mass index  is 28.35 kg/m. GEN: NAD EYE: Sclerae anicteric ENT: MMM CV: Non-tachycardic GI: Soft, NT/ND NEURO:  Alert & Oriented x 3  Lab Results: No results for input(s): WBC, HGB, HCT, PLT in the last 72 hours. BMET No results for input(s): NA, K, CL, CO2, GLUCOSE, BUN, CREATININE, CALCIUM in the last 72 hours. LFT No results for input(s): PROT, ALBUMIN, AST, ALT, ALKPHOS, BILITOT, BILIDIR, IBILI in the last 72 hours. PT/INR No results for input(s): LABPROT, INR in the last 72 hours.   Impression / Plan: This is a 71 y.o.female who presents for Colonoscopy for repeat attempt at evaluation after failed attempt in 2022.  History of IDA and concern for other issues within colon that need evaluation.  Severe diverticular disease and spasm and tortuosity has led to this.  The risks and benefits of endoscopic evaluation/treatment were discussed with the patient and/or family; these include but are not limited to the risk of perforation, infection, bleeding, missed lesions, lack of diagnosis, severe illness requiring hospitalization, as well as anesthesia and sedation related illnesses.  The patient's history has been reviewed, patient examined, no change in status, and deemed stable for procedure.  The patient and/or family is agreeable to proceed.    Justice Britain, MD [facility] Advanced [facility]oscopy Office # [phone]

## 2021-08-28 NOTE — Anesthesia Procedure Notes (Signed)
Procedure Name: Intubation Date/Time: 08/28/2021 7:54 AM Performed by: Gwyndolyn Saxon, CRNA Pre-anesthesia Checklist: Patient identified, Emergency Drugs available, Suction available and Patient being monitored Patient Re-evaluated:Patient Re-evaluated prior to induction Oxygen Delivery Method: Circle system utilized Preoxygenation: Pre-oxygenation with 100% oxygen Induction Type: IV induction, Rapid sequence and Cricoid Pressure applied Laryngoscope Size: Miller and 2 Grade View: Grade I Tube type: Oral Tube size: 7.0 mm Number of attempts: 1 Airway Equipment and Method: Stylet Placement Confirmation: ETT inserted through vocal cords under direct vision, positive ETCO2 and breath sounds checked- equal and bilateral Secured at: 20 cm Tube secured with: Tape Dental Injury: Teeth and Oropharynx as per pre-operative assessment

## 2021-08-28 NOTE — Anesthesia Postprocedure Evaluation (Signed)
Anesthesia Post Note  Patient: [name]  Procedure(s) Performed: COLONOSCOPY WITH PROPOFOL     Patient location during evaluation: PACU Anesthesia Type: General Level of consciousness: sedated and patient cooperative Pain management: pain level controlled Vital Signs Assessment: post-procedure vital signs reviewed and stable Respiratory status: spontaneous breathing Cardiovascular status: stable Anesthetic complications: no   No notable events documented.  Last Vitals:  Vitals:   08/28/21 0838 08/28/21 0853  BP: (!) 153/82 (!) 155/73  Pulse: 70 64  Resp: 19 16  Temp: 37.1 C   SpO2: 95% 92%    Last Pain:  Vitals:   08/28/21 0853  TempSrc:   PainSc: 0-No pain                 Nolon Nations

## 2021-08-29 ENCOUNTER — Telehealth: Payer: Self-pay | Admitting: Gastroenterology

## 2021-08-29 NOTE — Telephone Encounter (Signed)
The black stool does not clearly represent bleeding since there were no polyps removed or other interventions during yesterday's colonoscopy.  She also has IBS, and with bowel preparation and colonoscopy may have a flare of that.  I recommend just a liquid diet today, advance to soft food tomorrow as tolerated.  Heating pad for relief of abdominal cramps  She has a prescription for dicyclomine, but please refill if needed.  If she continues to have passage of black stool and no relief of symptoms with noted above, please contact on-call physician as she may require ED evaluation.  - HD

## 2021-08-29 NOTE — Telephone Encounter (Signed)
The pt had a colonoscopy yesterday with Dr Rush Landmark. See impression below:  - Hemorrhoids found on digital rectal exam. - The examined portion of the ileum was normal. - Tortuous left colon. - Diverticulosis in the recto-sigmoid colon, in the sigmoid colon and in the descending colon. - Normal mucosa in the entire examined colon. - Non-bleeding non-thrombosed external and internal hemorrhoids.  She states she had a BM at 3 pm and it was very watery and black with what looked like coffee ground.  She has lower left side abdominal cramping that radiates to the right side.   She says when she has a cramp she can not stand up straight due to the pain.  The cramping is not relieved by having a bowel movement.  She feels sore all over just does not feel well.  Dr [name] this is a patient of yours that Dr Rush Landmark saw, he is off can you please review?

## 2021-08-29 NOTE — Telephone Encounter (Signed)
The pt has been advised and will follow the recommendations per Dr [name].  She will call the on call doc if she continues passing dark stools and pain is not relieved.  She states she does not need any refills at this time.

## 2021-08-29 NOTE — Telephone Encounter (Signed)
Patient called back and stated that she forgot that she took her iron pill when she got home from her procedure, just wanted me to inform.

## 2021-08-29 NOTE — Telephone Encounter (Signed)
Inbound call from patient stated that she had a colonoscopy with Dr. Rush Landmark yesterday at [facility]. Stated that she had a very dark bowel movement. Wondering if that is normal. Please advise.

## 2021-08-30 ENCOUNTER — Other Ambulatory Visit: Payer: Self-pay | Admitting: Gastroenterology

## 2021-08-30 ENCOUNTER — Encounter (HOSPITAL_COMMUNITY): Payer: Self-pay | Admitting: Gastroenterology

## 2021-10-03 ENCOUNTER — Telehealth: Payer: Self-pay | Admitting: Gastroenterology

## 2021-10-03 MED ORDER — DICYCLOMINE HCL 10 MG PO CAPS: 10.0000 mg | ORAL_CAPSULE | Freq: Three times a day (TID) | ORAL | 0 refills | Status: DC

## 2021-10-03 NOTE — Telephone Encounter (Signed)
[place] I believe this is a Dr [name] pt.  Let me know if I am incorrect.   ?

## 2021-10-03 NOTE — Telephone Encounter (Signed)
Returned call to patient. She reports that she has been having chronic abdominal cramping for months. Pt states that the cramping never went away. Pt states that the heating pad helps. She does not remember having prescription for Dicyclomine, I told her that I will send in a prescription for her. Pt denies any SOB or dizziness. I asked pt has she had a fever or chills, she stated that she is always cold, but checked her temp and it was 98.6. Pt reports that she is still taking 1 iron tablet daily, she knows that iron can cause her stools to turn black. Pt will try dicyclomine over the weekend and if no improvement pt will call back. Pt verbalized understanding of all information and had no concerns at the end of the call. ?

## 2021-10-03 NOTE — Telephone Encounter (Signed)
Inbound call from patient states she is still experiencing dark stool and abd pain ?

## 2021-10-16 ENCOUNTER — Other Ambulatory Visit: Payer: Self-pay | Admitting: Gastroenterology

## 2021-10-21 ENCOUNTER — Telehealth: Payer: Self-pay | Admitting: Gastroenterology

## 2021-10-21 ENCOUNTER — Encounter: Payer: Self-pay | Admitting: Gastroenterology

## 2021-10-21 DIAGNOSIS — D500 Iron deficiency anemia secondary to blood loss (chronic): Secondary | ICD-10-CM

## 2021-10-21 NOTE — Telephone Encounter (Signed)
Inbound call from patient reports she had labs done by PCP and is still experiencing blood in stool  ?

## 2021-10-21 NOTE — Telephone Encounter (Signed)
[name] this is a Dr [name] pt.  Dr Rush Landmark did a colon an on the report he says to return to Dr [name]. See below ? ?"Further workup/evaluation of patient's symptoms as well as IDA as per primary GI team. Return to GI clinic will be arranged." ? ? ?

## 2021-10-21 NOTE — Telephone Encounter (Signed)
Returned call to patient. Pt states that she recently saw her PCP and they ordered a stool test that is showing blood. Pt reports that she has not noticed any bleeding other than from her hemorrhoids, she also reports that stools are black. Pt is still taking oral iron, I told her black stools is an expected side effect of iron. Pt last had labs is 08/2021, only lipid panel and LFT's were drawn. Pt asked if we received her stool study results. I told her that we do not have the results and she will need to have her PCP fax over to [place]. I could not access them in Care everywhere. I provided pt with the office fax. Pt is aware that once we receive the results we will have Dr. [name] review. Pt knows that Dr. [name] is out of the office for the rest of this week but we will contact her with his recommendations. Pt asked that I give her a call back once we receive the results. Pt verbalized understanding and had no concerns at the end of the call. ? ? ?

## 2021-10-27 ENCOUNTER — Other Ambulatory Visit: Payer: Self-pay

## 2021-10-27 NOTE — Telephone Encounter (Signed)
Inbound call from patient. Following up if her lab report has been received  ?

## 2021-10-27 NOTE — Telephone Encounter (Signed)
No records have been received. I called Dr. Arcola Jansky office and spoke with Jeani Hawking in Medical records. She is going to fax over most recent labs and stool test.  ? ?I called pt and informed her of the above information. Pt is aware that once Dr. [name] reviews we will be in touch. Pt verbalized understanding. ?

## 2021-10-27 NOTE — Telephone Encounter (Signed)
Stool results received and placed in your IN box for review.  ?Lab results are in care everywhere. ?

## 2021-10-28 NOTE — Telephone Encounter (Signed)
Stool positive for occult blood at Dr. Nicoletta Ba office on 10/21/2021. ?There are no blood test results with these records or in care everywhere since February. ? ?This patient is known to have iron deficiency anemia, and she has persistent heme positive stool despite healing of gastric ulcer on her December EGD and no source found on complete colonoscopy with Dr. Rush Landmark ? ?Recommendations: ? ?CBC and iron studies at our office within the next week ? ?Small bowel video capsule study ? ?Office visit with me 2 to 3 weeks after capsule study. ? ?- HD ?

## 2021-10-28 NOTE — Addendum Note (Signed)
Addended by: Yevette Edwards on: 10/28/2021 03:29 PM ? ? Modules accepted: Orders ? ?

## 2021-10-28 NOTE — Telephone Encounter (Signed)
Called and spoke with patient regarding her stool results and Dr. [name]' recommendations. Pt is aware that she needs to come by the lab within the next week at her convenience. She is aware that no appt is necessary, I provided her with the lab hours and location. Pt has been scheduled for a capsule endoscopy on Tuesday, 11/11/21 at 8:30 am. I reviewed what the capsule study is and she is aware that I will send her instructions via my chart and will mail a copy as well. Pt has been scheduled for a follow up appt on Tuesday, 12/09/21 at 1:40 pm. Pt verbalized understanding and had no concerns at the end of the call. ? ?Ambulatory referral to GI in epic. ?Instructions sent to patient via my chart and mailed. ?Lab orders in epic. ?

## 2021-10-30 ENCOUNTER — Other Ambulatory Visit (INDEPENDENT_AMBULATORY_CARE_PROVIDER_SITE_OTHER): Payer: Medicare HMO

## 2021-10-30 DIAGNOSIS — D500 Iron deficiency anemia secondary to blood loss (chronic): Secondary | ICD-10-CM | POA: Diagnosis not present

## 2021-10-30 LAB — CBC
HCT: 35.7 % — ABNORMAL LOW (ref 36.0–46.0)
Hemoglobin: 12 g/dL (ref 12.0–15.0)
MCHC: 33.7 g/dL (ref 30.0–36.0)
MCV: 98.7 fl (ref 78.0–100.0)
Platelets: 188 10*3/uL (ref 150.0–400.0)
RBC: 3.62 Mil/uL — ABNORMAL LOW (ref 3.87–5.11)
RDW: 13.7 % (ref 11.5–15.5)
WBC: 9 10*3/uL (ref 4.0–10.5)

## 2021-10-30 LAB — IBC + FERRITIN
Ferritin: 23.8 ng/mL (ref 10.0–291.0)
Iron: 65 ug/dL (ref 42–145)
Saturation Ratios: 15 % — ABNORMAL LOW (ref 20.0–50.0)
TIBC: 434 ug/dL (ref 250.0–450.0)
Transferrin: 310 mg/dL (ref 212.0–360.0)

## 2021-11-03 ENCOUNTER — Telehealth: Payer: Self-pay

## 2021-11-03 NOTE — Telephone Encounter (Signed)
Received a vm from patient. She had questions about the Carafate that was mentioned on her capsule endoscopy instructions. I told pt that she does not have to worry about that if she has not been prescribed Carafate. I told pt that these are the standard instructions that we give to everyone. I told pt that if she has to take any meds she needs to take them at least 2 hours prior to coming in for the capsule endoscopy. Pt verbalized understanding and had no concerns at the end of the call. ?

## 2021-11-11 ENCOUNTER — Ambulatory Visit (INDEPENDENT_AMBULATORY_CARE_PROVIDER_SITE_OTHER): Payer: Medicare HMO | Admitting: Gastroenterology

## 2021-11-11 ENCOUNTER — Encounter: Payer: Self-pay | Admitting: Gastroenterology

## 2021-11-11 DIAGNOSIS — D500 Iron deficiency anemia secondary to blood loss (chronic): Secondary | ICD-10-CM

## 2021-11-11 NOTE — Progress Notes (Signed)
SN: [id number] ?Exp: 2023-04-01 ?LOT: [id number] ?Patient arrived for Capsule Endoscopy. Reported the prep went well. This nurse explained dietary restrictions for the next few hours. Patient verbalized understanding. Opened capsule, ensured capsule was flashing prior to the patient swallowing the capsule. Patient swallowed capsule without difficulty. Patient instructed to return to the office at 4:00 pm today for removal of the recording equipment, to call the office with any questions and if no capsule was visualized after 72 hours. No further questions by the conclusion of the visit.  ?

## 2021-11-11 NOTE — Patient Instructions (Signed)

## 2021-11-13 ENCOUNTER — Telehealth: Payer: Self-pay | Admitting: Gastroenterology

## 2021-11-13 NOTE — Telephone Encounter (Signed)
Patient called to update nurse. Per patient, has passed capsule.  ?

## 2021-11-13 NOTE — Telephone Encounter (Signed)
Returned call to patient. I informed her that I received her message that she passed the capsule. I told pt that we will be in touch regarding her capsule endoscopy results. Pt verbalized understanding and had no concerns at the end of the call. ?

## 2021-11-27 ENCOUNTER — Encounter: Payer: Self-pay | Admitting: Gastroenterology

## 2021-11-27 NOTE — Telephone Encounter (Signed)
As it happens, I sent her a portal message about it a short while ago. ? ?HD ?

## 2021-11-27 NOTE — Telephone Encounter (Signed)
Patient called requesting the results of her Capsule Endoscopy.  Please call and advise. ?

## 2021-11-27 NOTE — Telephone Encounter (Signed)
Noted, thanks!

## 2021-12-09 ENCOUNTER — Encounter: Payer: Self-pay | Admitting: Gastroenterology

## 2021-12-09 ENCOUNTER — Other Ambulatory Visit (INDEPENDENT_AMBULATORY_CARE_PROVIDER_SITE_OTHER): Payer: Medicare HMO

## 2021-12-09 ENCOUNTER — Ambulatory Visit: Payer: Medicare HMO | Admitting: Gastroenterology

## 2021-12-09 VITALS — BP 130/60 | HR 77 | Ht 62.0 in | Wt 161.0 lb

## 2021-12-09 DIAGNOSIS — K7469 Other cirrhosis of liver: Secondary | ICD-10-CM

## 2021-12-09 DIAGNOSIS — K582 Mixed irritable bowel syndrome: Secondary | ICD-10-CM

## 2021-12-09 DIAGNOSIS — D500 Iron deficiency anemia secondary to blood loss (chronic): Secondary | ICD-10-CM | POA: Diagnosis not present

## 2021-12-09 DIAGNOSIS — I8510 Secondary esophageal varices without bleeding: Secondary | ICD-10-CM | POA: Diagnosis not present

## 2021-12-09 LAB — CBC WITH DIFFERENTIAL/PLATELET
Basophils Absolute: 0.1 10*3/uL (ref 0.0–0.1)
Basophils Relative: 0.6 % (ref 0.0–3.0)
Eosinophils Absolute: 0.2 10*3/uL (ref 0.0–0.7)
Eosinophils Relative: 1.4 % (ref 0.0–5.0)
HCT: 34.3 % — ABNORMAL LOW (ref 36.0–46.0)
Hemoglobin: 11.6 g/dL — ABNORMAL LOW (ref 12.0–15.0)
Lymphocytes Relative: 54 % — ABNORMAL HIGH (ref 12.0–46.0)
Lymphs Abs: 5.9 10*3/uL — ABNORMAL HIGH (ref 0.7–4.0)
MCHC: 33.7 g/dL (ref 30.0–36.0)
MCV: 97.9 fl (ref 78.0–100.0)
Monocytes Absolute: 0.7 10*3/uL (ref 0.1–1.0)
Monocytes Relative: 6 % (ref 3.0–12.0)
Neutro Abs: 4.1 10*3/uL (ref 1.4–7.7)
Neutrophils Relative %: 38 % — ABNORMAL LOW (ref 43.0–77.0)
Platelets: 202 10*3/uL (ref 150.0–400.0)
RBC: 3.5 Mil/uL — ABNORMAL LOW (ref 3.87–5.11)
RDW: 14.6 % (ref 11.5–15.5)
WBC: 10.9 10*3/uL — ABNORMAL HIGH (ref 4.0–10.5)

## 2021-12-09 LAB — IBC + FERRITIN
Ferritin: 32.7 ng/mL (ref 10.0–291.0)
Iron: 72 ug/dL (ref 42–145)
Saturation Ratios: 15.2 % — ABNORMAL LOW (ref 20.0–50.0)
TIBC: 473.2 ug/dL — ABNORMAL HIGH (ref 250.0–450.0)
Transferrin: 338 mg/dL (ref 212.0–360.0)

## 2021-12-09 LAB — HEPATIC FUNCTION PANEL
ALT: 22 U/L (ref 0–35)
AST: 23 U/L (ref 0–37)
Albumin: 4.2 g/dL (ref 3.5–5.2)
Alkaline Phosphatase: 99 U/L (ref 39–117)
Bilirubin, Direct: 0.1 mg/dL (ref 0.0–0.3)
Total Bilirubin: 0.4 mg/dL (ref 0.2–1.2)
Total Protein: 7 g/dL (ref 6.0–8.3)

## 2021-12-09 LAB — PROTIME-INR
INR: 1.1 ratio — ABNORMAL HIGH (ref 0.8–1.0)
Prothrombin Time: 11.6 s (ref 9.6–13.1)

## 2021-12-09 MED ORDER — METRONIDAZOLE 500 MG PO TABS: 500.0000 mg | ORAL_TABLET | Freq: Three times a day (TID) | ORAL | 0 refills | Status: AC

## 2021-12-09 NOTE — Patient Instructions (Signed)
If you are age 73 or older, your body mass index should be between 23-30. Your Body mass index is 29.45 kg/m. If this is out of the aforementioned range listed, please consider follow up with your Primary Care Provider.  If you are age 80 or younger, your body mass index should be between 19-25. Your Body mass index is 29.45 kg/m. If this is out of the aformentioned range listed, please consider follow up with your Primary Care Provider.   ________________________________________________________  The [place] [facility] providers would like to encourage you to use [facility] to communicate with providers for non-urgent requests or questions.  Due to long hold times on the telephone, sending your provider a message by [facility] may be a faster and more efficient way to get a response.  Please allow 48 business hours for a response.  Please remember that this is for non-urgent requests.  _______________________________________________________' Your provider has requested that you go to the basement level for lab work before leaving today. Press "B" on the elevator. The lab is located at the first door on the left as you exit the elevator.  You have been scheduled for an abdominal ultrasound at [facility] (1st floor of hospital) on 12-18-2021 at 9:30 am. Please arrive 15 minutes prior to your appointment for registration. Make certain not to have anything to eat or drink 6 hours prior to your appointment. Should you need to reschedule your appointment, please contact radiology at [phone]. This test typically takes about 30 minutes to perform.   Due to recent changes in healthcare laws, you may see the results of your imaging and laboratory studies on MyChart before your provider has had a chance to review them.  We understand that in some cases there may be results that are confusing or concerning to you. Not all laboratory results come back in the same time frame and the provider may be waiting for  multiple results in order to interpret others.  Please give [place] 48 hours in order for your provider to thoroughly review all the results before contacting the office for clarification of your results.    It was a pleasure to see you today!  Thank you for trusting me with your gastrointestinal care!

## 2021-12-09 NOTE — Progress Notes (Signed)
[place] [facility] Progress Note  Chief Complaint: Cirrhosis and iron deficiency anemia  Subjective  History: [name] follows up for her cirrhosis, abdominal pain and iron deficiency anemia with heme positive stool. She had upper GI findings during inpatient EGD August 2022 felt to possibly explain the IDA.  Repeat EGD and colonoscopy in December 2022.  Gastric ulcer had healed, colonoscope could not be advanced beyond sigmoid colon due to severe diverticulosis, tortuosity and redundancy. She had a subsequent successful and complete colonoscopy by Dr. Rush Landmark in the hospital setting, no source for anemia found. We received communication from her PCP that she had IDA and was heme positive. 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 From last clinic visit in November 2022 regarding her cirrhosis: "We discussed the cirrhosis diagnosis, most likely prior fatty liver that was unknown.  LFTs remain normal, platelets and INR also normal.  No encephalopathy, only trace perihepatic ascites on CT scan.  No liver mass seen.  She will need long-term hepatoma screening.  Grade 1 esophageal varices do not require beta-blocker therapy. " [name] also has chronic fairly generalized abdominal pain with alternating constipation and diarrhea. -------------------------  [name] was concerned about her cirrhosis, anemia and fairly generalized abdominal pain (the latter having bothered her for decades).  She has intermittent bandlike lower abdominal sharp cramps and does not seem to get much improvement from dicyclomine.  Tends more toward constipation and has to take Dulcolax most days, less often has loose stool.  Sometimes bloating and distention.  ROS: Cardiovascular: She still experiences a nonexertional sharp substernal chest pain for which she was evaluated by cardiology last fall after the hospital discharge. Respiratory: no dyspnea Fatigue The patient's Past  Medical, Family and Social History were reviewed and are on file in the EMR. Past Medical History:  Diagnosis Date   Anxiety    Arthritis    Asthma    Barrett esophagus    Chronic bronchitis (HCC)    Chronic diarrhea    Chronic lower back pain    Coronary atherosclerosis of native coronary artery    DES to LAD May 2014   Diverticulosis    Essential hypertension    Fatty liver    Fibromyalgia    GERD (gastroesophageal reflux disease)    H/O hiatal hernia    IBS (irritable bowel syndrome)    Major depression in partial remission ([place])    Migraines    Mixed hyperlipidemia    Myocardial infarction ([place])    2014   OSA on CPAP    Osteoporosis    Pancreatitis 2008   Pneumonia 1990's   Schatzki's ring    Sleep apnea    Type 2 diabetes mellitus ([place])     Objective:  Med list reviewed  Current Outpatient Medications:    albuterol (PROVENTIL HFA;VENTOLIN HFA) 108 (90 Base) MCG/ACT inhaler, Inhale 2 puffs into the lungs every 4 (four) hours as needed for shortness of breath (Asthma)., Disp: , Rfl: 3   ALPRAZolam (XANAX) 1 MG tablet, Take 1 mg by mouth at bedtime., Disp: , Rfl: 1   aspirin EC 81 MG tablet, Take 81 mg by mouth daily., Disp: , Rfl:    atorvastatin (LIPITOR) 40 MG tablet, Take 1 tablet (40 mg total) by mouth daily., Disp: 90 tablet, Rfl: 3   BIOTIN PO, Take 1 tablet by mouth daily., Disp: , Rfl:    cetirizine (ZYRTEC) 10 MG tablet, Take 10 mg by mouth daily  as needed for allergies., Disp: , Rfl:    Cholecalciferol (VITAMIN D-3) 125 MCG (5000 UT) TABS, Take 10,000 Units by mouth daily., Disp: , Rfl:    dicyclomine (BENTYL) 10 MG capsule, Take 1 capsule (10 mg total) by mouth 3 (three) times daily before meals., Disp: 90 capsule, Rfl: 0   diltiazem (CARTIA XT) 120 MG 24 hr capsule, Take 1 capsule (120 mg total) by mouth every morning., Disp: 90 capsule, Rfl: 3   DULoxetine (CYMBALTA) 60 MG capsule, Take 60 mg by mouth daily., Disp: , Rfl:    FEROSUL 325 (65 Fe) MG  tablet, Take 325 mg by mouth daily., Disp: , Rfl:    HYDROcodone-acetaminophen (NORCO/VICODIN) 5-325 MG tablet, Take 1 tablet by mouth 2 (two) times daily., Disp: , Rfl: 0   isosorbide mononitrate (IMDUR) 30 MG 24 hr tablet, Take 1 tablet (30 mg total) by mouth daily., Disp: 90 tablet, Rfl: 1   losartan-hydrochlorothiazide (HYZAAR) 100-25 MG tablet, Take 1 tablet by mouth daily., Disp: 90 tablet, Rfl: 3   metoprolol tartrate (LOPRESSOR) 25 MG tablet, Take 1 tablet (25 mg total) by mouth 2 (two) times daily., Disp: 180 tablet, Rfl: 3   metroNIDAZOLE (FLAGYL) 500 MG tablet, Take 1 tablet (500 mg total) by mouth 3 (three) times daily for 7 days., Disp: 21 tablet, Rfl: 0   montelukast (SINGULAIR) 10 MG tablet, Take 10 mg by mouth at bedtime. , Disp: , Rfl:    Multiple Vitamins-Minerals (MULTIVITAMIN WITH MINERALS) tablet, Take 1 tablet by mouth daily., Disp: , Rfl:    nitroGLYCERIN (NITROSTAT) 0.4 MG SL tablet, Place 1 tablet (0.4 mg total) under the tongue every 5 (five) minutes x 3 doses as needed (if no relief after 3rd dose, proceed to the ED for an evaluation or call 911)., Disp: 25 tablet, Rfl: 3   NOVOLOG FLEXPEN 100 UNIT/ML FlexPen, Inject 0-30 Units into the skin 3 (three) times daily as needed for high blood sugar (If blood glucose is above 200)., Disp: , Rfl:    omeprazole (PRILOSEC) 40 MG capsule, Take 1 capsule (40 mg total) by mouth 2 (two) times daily., Disp: 180 capsule, Rfl: 2   ondansetron (ZOFRAN) 4 MG tablet, TAKE 1 TABLET BY MOUTH EVERY 8 HOURS AS NEEDED FOR NAUSEA AND VOMITING, Disp: 45 tablet, Rfl: 2   promethazine (PHENERGAN) 25 MG tablet, Take 25 mg by mouth every 6 (six) hours as needed for nausea or vomiting., Disp: , Rfl:    traZODone (DESYREL) 50 MG tablet, Take 150 mg by mouth at bedtime., Disp: , Rfl:    TRESIBA FLEXTOUCH 200 UNIT/ML FlexTouch Pen, Inject 40 Units into the skin daily. As directed (Patient taking differently: Inject 45 Units into the skin 2 (two) times daily.  As directed Between novolog), Disp: , Rfl:    metoCLOPramide (REGLAN) 5 MG tablet, Take 1 tablet 30-45 minutes before evening and AM doses of bowel preparation solution., Disp: 2 tablet, Rfl: 0   Vital signs in last 24 hrs: Vitals:   12/09/21 1334  BP: 130/60  Pulse: 77   Wt Readings from Last 3 Encounters:  12/09/21 161 lb (73 kg)  08/28/21 155 lb (70.3 kg)  06/19/21 157 lb (71.2 kg)    Physical Exam  Well-appearing.  Husband present for entire visit. HEENT: sclera anicteric, oral mucosa moist without lesions Neck: supple, no thyromegaly, JVD or lymphadenopathy Cardiac: RRR without murmurs, S1S2 heard, no peripheral edema Pulm: clear to auscultation bilaterally, normal RR and effort noted Abdomen: soft, generalized  tenderness to light palpation of abdominal wall, with active bowel sounds. No guarding or palpable hepatosplenomegaly. Skin; warm and dry, no jaundice or rash  Labs:   ___________________________________________ Radiologic studies:   ____________________________________________ Other:   _____________________________________________ Assessment & Plan  Assessment: Encounter Diagnoses  Name Primary?   Cryptogenic cirrhosis (HCC) Yes   Irritable bowel syndrome with both constipation and diarrhea    Secondary esophageal varices without bleeding (HCC)    Iron deficiency anemia due to chronic blood loss     Cirrhosis most likely from previously undiagnosed fatty liver.  Small nonbleeding varices not requiring beta-blocker, no ascites, no encephalopathy.  Longstanding IBS that is predominantly constipation but with occasional diarrhea.  Talked about the unclear nature of this and limited available treatment.  Antispasmodic has not been very helpful for her.  No previous testing or treatment for SIBO, and the diffuse nature of the pain with abdominal wall sensitivity is not entirely consistent with that.  Also seems unlikely to be a chronic diverticulitis given its  diffuse nature, but for both of those reasons a course of Flagyl seems reasonable  We also discussed the need for long-term follow-up of blood counts and probably periodic iron infusions since the iron tablets cause more digestive upset.  She has nonbleeding AVMs causing chronic occult GI blood loss and iron deficiency anemia.  They are beyond the reach of endoscopic management.  Plan: CBC and iron studies today.  Then further recommendations on whether or not to continue oral iron and when to recheck blood counts to see if IV iron needed.  Flagyl 500 mg 3 times daily for 7 days  AFP and INR for updated MELD score and abdominal ultrasound for [place] screening and assessment for any ascites that may be present.  33 minutes were spent on this encounter (including chart review, history/exam, counseling/coordination of care, and documentation) > 50% of that time was spent on counseling and coordination of care.   Nelida Meuse III

## 2021-12-10 ENCOUNTER — Telehealth: Payer: Self-pay | Admitting: Physician Assistant

## 2021-12-10 ENCOUNTER — Other Ambulatory Visit: Payer: Self-pay

## 2021-12-10 DIAGNOSIS — D500 Iron deficiency anemia secondary to blood loss (chronic): Secondary | ICD-10-CM

## 2021-12-10 LAB — AFP TUMOR MARKER: AFP-Tumor Marker: 5.3 ng/mL

## 2021-12-10 NOTE — Telephone Encounter (Signed)
Scheduled appt per 5/24 referral. Pt is aware of appt date and time. Pt is aware to arrive 15 mins prior to appt time and to bring and updated insurance card. Pt is aware of appt location.   

## 2021-12-18 ENCOUNTER — Ambulatory Visit (HOSPITAL_COMMUNITY)
Admission: RE | Admit: 2021-12-18 | Discharge: 2021-12-18 | Disposition: A | Discharge status: Home / Self Care | Payer: Medicare HMO | Source: Ambulatory Visit | Attending: Gastroenterology | Admitting: Gastroenterology

## 2021-12-18 DIAGNOSIS — I8510 Secondary esophageal varices without bleeding: Secondary | ICD-10-CM | POA: Diagnosis present

## 2021-12-18 DIAGNOSIS — D500 Iron deficiency anemia secondary to blood loss (chronic): Secondary | ICD-10-CM | POA: Insufficient documentation

## 2021-12-18 DIAGNOSIS — K582 Mixed irritable bowel syndrome: Secondary | ICD-10-CM | POA: Diagnosis present

## 2021-12-18 DIAGNOSIS — K7469 Other cirrhosis of liver: Secondary | ICD-10-CM | POA: Diagnosis present

## 2021-12-20 ENCOUNTER — Other Ambulatory Visit: Payer: Self-pay | Admitting: Gastroenterology

## 2021-12-24 ENCOUNTER — Encounter: Payer: Self-pay | Admitting: Physician Assistant

## 2021-12-24 ENCOUNTER — Inpatient Hospital Stay: Payer: Medicare HMO

## 2021-12-24 ENCOUNTER — Inpatient Hospital Stay: Payer: Medicare HMO | Attending: Physician Assistant | Admitting: Physician Assistant

## 2021-12-24 VITALS — BP 127/50 | HR 60 | Temp 97.7°F | Resp 17 | Ht 62.0 in | Wt 162.6 lb

## 2021-12-24 DIAGNOSIS — D72820 Lymphocytosis (symptomatic): Secondary | ICD-10-CM | POA: Insufficient documentation

## 2021-12-24 DIAGNOSIS — D509 Iron deficiency anemia, unspecified: Secondary | ICD-10-CM | POA: Diagnosis present

## 2021-12-24 DIAGNOSIS — D500 Iron deficiency anemia secondary to blood loss (chronic): Secondary | ICD-10-CM | POA: Diagnosis not present

## 2021-12-24 DIAGNOSIS — Z800 Family history of malignant neoplasm of digestive organs: Secondary | ICD-10-CM | POA: Insufficient documentation

## 2021-12-24 DIAGNOSIS — Z87891 Personal history of nicotine dependence: Secondary | ICD-10-CM | POA: Diagnosis not present

## 2021-12-24 LAB — CMP (CANCER CENTER ONLY)
ALT: 23 U/L (ref 0–44)
AST: 30 U/L (ref 15–41)
Albumin: 4 g/dL (ref 3.5–5.0)
Alkaline Phosphatase: 70 U/L (ref 38–126)
Anion gap: 9 (ref 5–15)
BUN: 11 mg/dL (ref 8–23)
CO2: 29 mmol/L (ref 22–32)
Calcium: 9.5 mg/dL (ref 8.9–10.3)
Chloride: 99 mmol/L (ref 98–111)
Creatinine: 0.84 mg/dL (ref 0.44–1.00)
GFR, Estimated: >60 mL/min (ref >60)
Glucose, Bld: 231 mg/dL — ABNORMAL HIGH (ref 70–99)
Potassium: 4.2 mmol/L (ref 3.5–5.1)
Sodium: 137 mmol/L (ref 135–145)
Total Bilirubin: 0.6 mg/dL (ref 0.3–1.2)
Total Protein: 6.9 g/dL (ref 6.5–8.1)

## 2021-12-24 LAB — CBC WITH DIFFERENTIAL (CANCER CENTER ONLY)
Abs Immature Granulocytes: 0.03 10*3/uL (ref 0.00–0.07)
Basophils Absolute: 0.1 10*3/uL (ref 0.0–0.1)
Basophils Relative: 0 %
Eosinophils Absolute: 0.2 10*3/uL (ref 0.0–0.5)
Eosinophils Relative: 2 %
HCT: 33.7 % — ABNORMAL LOW (ref 36.0–46.0)
Hemoglobin: 11.3 g/dL — ABNORMAL LOW (ref 12.0–15.0)
Immature Granulocytes: 0 %
Lymphocytes Relative: 51 %
Lymphs Abs: 7 10*3/uL — ABNORMAL HIGH (ref 0.7–4.0)
MCH: 33.1 pg (ref 26.0–34.0)
MCHC: 33.5 g/dL (ref 30.0–36.0)
MCV: 98.8 fL (ref 80.0–100.0)
Monocytes Absolute: 1 10*3/uL (ref 0.1–1.0)
Monocytes Relative: 7 %
Neutro Abs: 5.6 10*3/uL (ref 1.7–7.7)
Neutrophils Relative %: 40 %
Platelet Count: 248 10*3/uL (ref 150–400)
RBC: 3.41 MIL/uL — ABNORMAL LOW (ref 3.87–5.11)
RDW: 13.2 % (ref 11.5–15.5)
Smear Review: NORMAL
WBC Count: 13.8 10*3/uL — ABNORMAL HIGH (ref 4.0–10.5)
nRBC: 0 % (ref 0.0–0.2)

## 2021-12-24 LAB — RETIC PANEL
Immature Retic Fract: 18.5 % — ABNORMAL HIGH (ref 2.3–15.9)
RBC.: 3.37 MIL/uL — ABNORMAL LOW (ref 3.87–5.11)
Retic Count, Absolute: 109.9 10*3/uL (ref 19.0–186.0)
Retic Ct Pct: 3.3 % — ABNORMAL HIGH (ref 0.4–3.1)
Reticulocyte Hemoglobin: 33.7 pg (ref >27.9)

## 2021-12-24 LAB — IRON AND IRON BINDING CAPACITY (CC-WL,HP ONLY)
Iron: 56 ug/dL (ref 28–170)
Saturation Ratios: 12 % (ref 10.4–31.8)
TIBC: 460 ug/dL — ABNORMAL HIGH (ref 250–450)
UIBC: 404 ug/dL

## 2021-12-24 LAB — LACTATE DEHYDROGENASE: LDH: 134 U/L (ref 98–192)

## 2021-12-24 NOTE — Progress Notes (Signed)
[place] [phone]   Fax:([phone] [place] NOTE  Patient Care Team: Caryl Bis, MD as PCP - General (Family Medicine) [name], MD as PCP - Cardiology (Cardiology) [name], MD as Consulting Physician (Cardiology) Deneise Lever, MD as Consulting Physician (Pulmonary Disease) Dr. Gar Ponto (Family Medicine)   CHIEF COMPLAINTS/PURPOSE OF CONSULTATION:  Iron deficiency anemia  HISTORY OF PRESENTING ILLNESS:  [name] 71 y.o. female with medical history significant for cirrhosis, Barrett's esophagus, GERD, irritable bowel syndrome, hyperlipidemia, hypertension, obstructive sleep apnea, and type 2 diabetes.  He presents to the hematology clinic for initial evaluation for iron deficiency anemia.  She is accompanied by her husband for this visit.  On review of the previous records, [name] has iron deficiency anemia starting August 2022.  She was hospitalized at that time and received 2 units of PRBC, underwent EGD that noted gastritis and grade 1 varices.  She was started on ferrous sulfate 325 mg twice daily.  Post recent lab work from 12/09/2021 shows improving anemia with a hemoglobin of 11.6 however there is persistent iron deficiency with saturation 15%, ferritin 32 and TIBC 473.  On exam today, [name] reports low energy and require frequent resting.  She can easily fall asleep due to excessive fatigue.  She reports dysphagia and regurgitation due to esophageal spasms secondary to Schatzki ring.  She reports nausea and occasional episodes of vomiting.  She has IBS and the iron pills are making her bowel habits worse with more constipation.  He needs to take Dulcolax daily to have a bowel movement. She denies fevers, chills, night sweats, shortness of breath, chest pain or cough. She has no other complaints. Rest of the 10 point ROS is below.   MEDICAL HISTORY:  Past Medical History:  Diagnosis Date    Anxiety    Arthritis    Asthma    Barrett esophagus    Chronic bronchitis (HCC)    Chronic diarrhea    Chronic lower back pain    Coronary atherosclerosis of native coronary artery    DES to LAD May 2014   Diverticulosis    Essential hypertension    Fatty liver    Fibromyalgia    GERD (gastroesophageal reflux disease)    H/O hiatal hernia    IBS (irritable bowel syndrome)    Major depression in partial remission ([place])    Migraines    Mixed hyperlipidemia    Myocardial infarction ([place])    2014   OSA on CPAP    Osteoporosis    Pancreatitis 2008   Pneumonia 1990's   Schatzki's ring    Sleep apnea    Type 2 diabetes mellitus ([place])     SURGICAL HISTORY: Past Surgical History:  Procedure Laterality Date   39 HOUR [place] STUDY N/A 02/16/2018   Procedure: 24 HOUR PH STUDY;  Surgeon: Mauri Pole, MD;  [facility]: [facility];  Service: Endoscopy;  Laterality: N/A;   ABDOMINAL HYSTERECTOMY  ?1987   APPENDECTOMY  ?1987   BIOPSY  03/02/2021   Procedure: BIOPSY;  Surgeon: Lavena Bullion, DO;  Location: [facility];  Service: Gastroenterology;;   BUNIONECTOMY Bilateral Iron Bilateral ?1990   CATARACT EXTRACTION W/ INTRAOCULAR LENS  IMPLANT, BILATERAL Bilateral ?2005   [facility]   CHOLECYSTECTOMY  1990'S   COLONOSCOPY  10/13   Dr. Britta Mccreedy - tubular adenomas and diverticulosis   COLONOSCOPY WITH PROPOFOL N/A  08/28/2021   Procedure: COLONOSCOPY WITH PROPOFOL;  Surgeon: Rush Landmark [name], MD;  Location: [facility];  Service: Gastroenterology;  Laterality: N/A;  ultra slim scope   DILATION AND CURETTAGE OF UTERUS  1970's   "probably 3" (12/09/2012)   ERCP  2008   ESOPHAGEAL DILATION  ` 2012   ESOPHAGEAL MANOMETRY N/A 10/21/2015   Procedure: ESOPHAGEAL MANOMETRY (EM);  Surgeon: Doran Stabler, MD;  [facility];  Service: Gastroenterology;  Laterality: N/A;   ESOPHAGEAL MANOMETRY N/A 02/16/2018   Procedure: ESOPHAGEAL  MANOMETRY (EM);  Surgeon: Mauri Pole, MD;  [facility];  Service: Endoscopy;  Laterality: N/A;   ESOPHAGOGASTRODUODENOSCOPY Left 03/02/2021   Procedure: ESOPHAGOGASTRODUODENOSCOPY (EGD);  Surgeon: Lavena Bullion, DO;  Location: [facility];  Service: Gastroenterology;  Laterality: Left;   HUMERUS FRACTURE SURGERY Right 07/19/1969   "horst threw me" (12/09/2012)   INTRAVASCULAR PRESSURE WIRE/FFR STUDY N/A 05/12/2021   Procedure: INTRAVASCULAR PRESSURE WIRE/FFR STUDY;  Surgeon: Nelva Bush, MD;  Location: [facility];  Service: Cardiovascular;  Laterality: N/A;   LEFT HEART CATH AND CORONARY ANGIOGRAPHY N/A 12/29/2017   Procedure: LEFT HEART CATH AND CORONARY ANGIOGRAPHY;  Surgeon: Wellington Hampshire, MD;  Location: [facility];  Service: Cardiovascular;  Laterality: N/A;   LEFT HEART CATH AND CORONARY ANGIOGRAPHY N/A 05/12/2021   Procedure: LEFT HEART CATH AND CORONARY ANGIOGRAPHY;  Surgeon: Nelva Bush, MD;  Location: [facility];  Service: Cardiovascular;  Laterality: N/A;   NASAL SEPTUM SURGERY  1970's   PERCUTANEOUS CORONARY STENT INTERVENTION (PCI-S) N/A 12/09/2012   Procedure: PERCUTANEOUS CORONARY STENT INTERVENTION (PCI-S);  Surgeon: Burnell Blanks, MD;  [facility];  Service: Cardiovascular;  Laterality: N/A;   [place] IMPEDANCE STUDY N/A 02/16/2018   Procedure: [place] IMPEDANCE STUDY;  Surgeon: Mauri Pole, MD;  Location[facility];  Service: Endoscopy;  Laterality: N/A;   TOE FUSION Right 10/11   First MTP joint   TOE SURGERY Right 1990's   "toe next to big toe:  dr said I had tumor; cut bone & stuff; another OR dr stretched tendons, etc" (12/09/2012)   TUBAL LIGATION  1985    SOCIAL HISTORY: Social History   Socioeconomic History   Marital status: Married    Spouse name: [name]   Number of children: 2   Years of education: Not on file   Highest education level: Not on file  Occupational History    Occupation: Retired  [employer]  Tobacco Use   Smoking status: Former    Packs/day: 0.80    Years: 15.00    Total pack years: 12.00    Types: Cigarettes    Start date: 07/20/1976    Quit date: 07/20/1993    Years since quitting: 28.4   Smokeless tobacco: Never  Vaping Use   Vaping Use: Never used  Substance and Sexual Activity   Alcohol use: No    Alcohol/week: 0.0 standard drinks of alcohol   Drug use: No   Sexual activity: Yes  Other Topics Concern   Not on file  Social History Narrative   Not on file   Social Determinants of Health   Financial Resource Strain: Not on file  Food Insecurity: Not on file  Transportation Needs: Not on file  Physical Activity: Not on file  Stress: Not on file  Social Connections: Not on file  Intimate Partner Violence: Not on file    FAMILY HISTORY: Family History  Problem Relation Age of Onset  Stroke Mother    Cirrhosis Father    Hypertension Father    Esophageal cancer Sister    Colon polyps Sister        pre-cancerous, part of colon removed   Prostate cancer Brother    Colon cancer Neg Hx    Rectal cancer Neg Hx    Stomach cancer Neg Hx     ALLERGIES:  is allergic to erythromycin base, azithromycin, pollen extract, remeron [mirtazapine], zolpidem tartrate, other, sulfonamide derivatives, and tree extract.  MEDICATIONS:  Current Outpatient Medications  Medication Sig Dispense Refill   albuterol (PROVENTIL HFA;VENTOLIN HFA) 108 (90 Base) MCG/ACT inhaler Inhale 2 puffs into the lungs every 4 (four) hours as needed for shortness of breath (Asthma).  3   ALPRAZolam (XANAX) 1 MG tablet Take 1 mg by mouth at bedtime.  1   aspirin EC 81 MG tablet Take 81 mg by mouth daily.     atorvastatin (LIPITOR) 40 MG tablet Take 1 tablet (40 mg total) by mouth daily. 90 tablet 3   BIOTIN PO Take 1 tablet by mouth daily.     cetirizine (ZYRTEC) 10 MG tablet Take 10 mg by mouth daily as needed for allergies.     Cholecalciferol (VITAMIN D-3) 125 MCG  (5000 UT) TABS Take 10,000 Units by mouth daily.     dicyclomine (BENTYL) 10 MG capsule TAKE 1 CAPSULE (10 MG TOTAL) BY MOUTH 3 (THREE) TIMES DAILY BEFORE MEALS. 90 capsule 0   diltiazem (CARTIA XT) 120 MG 24 hr capsule Take 1 capsule (120 mg total) by mouth every morning. 90 capsule 3   DULoxetine (CYMBALTA) 60 MG capsule Take 60 mg by mouth daily.     FEROSUL 325 (65 Fe) MG tablet Take 325 mg by mouth daily.     HYDROcodone-acetaminophen (NORCO/VICODIN) 5-325 MG tablet Take 1 tablet by mouth 2 (two) times daily.  0   isosorbide mononitrate (IMDUR) 30 MG 24 hr tablet Take 1 tablet (30 mg total) by mouth daily. 90 tablet 1   losartan-hydrochlorothiazide (HYZAAR) 100-25 MG tablet Take 1 tablet by mouth daily. 90 tablet 3   metoCLOPramide (REGLAN) 5 MG tablet Take 1 tablet 30-45 minutes before evening and AM doses of bowel preparation solution. 2 tablet 0   metoprolol tartrate (LOPRESSOR) 25 MG tablet Take 1 tablet (25 mg total) by mouth 2 (two) times daily. 180 tablet 3   montelukast (SINGULAIR) 10 MG tablet Take 10 mg by mouth at bedtime.      Multiple Vitamins-Minerals (MULTIVITAMIN WITH MINERALS) tablet Take 1 tablet by mouth daily.     nitroGLYCERIN (NITROSTAT) 0.4 MG SL tablet Place 1 tablet (0.4 mg total) under the tongue every 5 (five) minutes x 3 doses as needed (if no relief after 3rd dose, proceed to the ED for an evaluation or call 911). 25 tablet 3   NOVOLOG FLEXPEN 100 UNIT/ML FlexPen Inject 0-30 Units into the skin 3 (three) times daily as needed for high blood sugar (If blood glucose is above 200).     omeprazole (PRILOSEC) 40 MG capsule Take 1 capsule (40 mg total) by mouth 2 (two) times daily. 180 capsule 2   ondansetron (ZOFRAN) 4 MG tablet TAKE 1 TABLET BY MOUTH EVERY 8 HOURS AS NEEDED FOR NAUSEA AND VOMITING 45 tablet 2   promethazine (PHENERGAN) 25 MG tablet Take 25 mg by mouth every 6 (six) hours as needed for nausea or vomiting.     traZODone (DESYREL) 50 MG tablet Take 150 mg  by mouth  at bedtime.     TRESIBA FLEXTOUCH 200 UNIT/ML FlexTouch Pen Inject 40 Units into the skin daily. As directed (Patient taking differently: Inject 45 Units into the skin 2 (two) times daily. As directed Between novolog)     No current facility-administered medications for this visit.   Facility-Administered Medications Ordered in Other Visits  Medication Dose Route Frequency Provider Last Rate Last Admin   0.9 %  sodium chloride infusion   Intravenous Once Oaklyn Mans T, PA-C       iron sucrose (VENOFER) 200 mg in sodium chloride 0.9 % 100 mL IVPB  200 mg Intravenous Once Judson Tsan T, PA-C        REVIEW OF SYSTEMS:   Constitutional: ( - ) fevers, ( - )  chills , ( - ) night sweats Eyes: ( - ) blurriness of vision, ( - ) double vision, ( - ) watery eyes Ears, nose, mouth, throat, and face: ( - ) mucositis, ( - ) sore throat Respiratory: ( - ) cough, ( - ) dyspnea, ( - ) wheezes Cardiovascular: ( - ) palpitation, ( - ) chest discomfort, ( - ) lower extremity swelling Gastrointestinal:  ( - ) nausea, ( - ) heartburn, ( - ) change in bowel habits Skin: ( - ) abnormal skin rashes Lymphatics: ( - ) new lymphadenopathy, ( - ) easy bruising Neurological: ( - ) numbness, ( - ) tingling, ( - ) new weaknesses Behavioral/Psych: ( - ) mood change, ( - ) new changes  All other systems were reviewed with the patient and are negative.  PHYSICAL EXAMINATION: ECOG PERFORMANCE STATUS: 1 - Symptomatic but completely ambulatory  Vitals:   12/24/21 1400  BP: (!) 127/50  Pulse: 60  Resp: 17  Temp: 97.7 F (36.5 C)  SpO2: 96%   Filed Weights   12/24/21 1400  Weight: 162 lb 9.6 oz (73.8 kg)    GENERAL: well appearing female in NAD  SKIN: skin color, texture, turgor are normal, no rashes or significant lesions EYES: conjunctiva are pink and non-injected, sclera clear OROPHARYNX: no exudate, no erythema; lips, buccal mucosa, and tongue normal  NECK: supple, non-tender LYMPH:  no  palpable lymphadenopathy in the cervical or supraclavicular lymph nodes.  LUNGS: clear to auscultation and percussion with normal breathing effort HEART: regular rate & rhythm and no murmurs and no lower extremity edema ABDOMEN: soft, non-tender, non-distended, normal bowel sounds Musculoskeletal: no cyanosis of digits and no clubbing  PSYCH: alert & oriented x 3, fluent speech NEURO: no focal motor/sensory deficits  LABORATORY DATA:  I have reviewed the data as listed    Latest Ref Rng & Units 12/24/2021    3:14 PM 12/09/2021    2:31 PM 10/30/2021   11:28 AM  CBC  WBC 4.0 - 10.5 K/uL 13.8  10.9  9.0   Hemoglobin 12.0 - 15.0 g/dL 11.3  11.6  12.0   Hematocrit 36.0 - 46.0 % 33.7  34.3  35.7   Platelets 150 - 400 K/uL 248  202.0  188.0        Latest Ref Rng & Units 12/24/2021    3:14 PM 12/09/2021    2:31 PM 04/15/2021   11:56 AM  CMP  Glucose 70 - 99 mg/dL 231   239   BUN 8 - 23 mg/dL 11   11   Creatinine 0.44 - 1.00 mg/dL 0.84   0.69   Sodium 135 - 145 mmol/L 137   136   Potassium 3.5 -  5.1 mmol/L 4.2   4.2   Chloride 98 - 111 mmol/L 99   99   CO2 22 - 32 mmol/L 29   30   Calcium 8.9 - 10.3 mg/dL 9.5   9.7   Total Protein 6.5 - 8.1 g/dL 6.9  7.0    Total Bilirubin 0.3 - 1.2 mg/dL 0.6  0.4    Alkaline Phos 38 - 126 U/L 70  99    AST 15 - 41 U/L 30  23    ALT 0 - 44 U/L 23  22      RADIOGRAPHIC STUDIES: I have personally reviewed the radiological images as listed and agreed with the findings in the report. US Abdomen Complete  Result Date: 12/19/2021 CLINICAL DATA:  Cirrhosis.  Cholecystectomy. EXAM: ABDOMEN ULTRASOUND COMPLETE COMPARISON:  Right upper quadrant ultrasound March 07, 2021. CT scan of the abdomen and pelvis March 01, 2021. FINDINGS: Gallbladder: Surgically absent Common bile duct: Diameter: 11.3 mm Liver: Mildly nodular contour. No suspicious mass. Heterogeneous echotexture. Portal vein is patent on color Doppler imaging with normal direction of blood flow towards  the liver. IVC: No abnormality visualized. Pancreas: Visualized portion unremarkable. Spleen: Size and appearance within normal limits. Right Kidney: Length: 11.2 cm. There is a parapelvic cyst on the right, simple in appearance, of no clinical significance. No follow-up imaging recommended. Left Kidney: Length: 11.3 cm. Echogenicity within normal limits. No mass or hydronephrosis visualized. Abdominal aorta: No aneurysm visualized. Other findings: None. IMPRESSION: 1. The liver demonstrates a cirrhotic morphology with a nodular contour and heterogeneous echotexture. No suspicious liver masses identified. 2. The common bile duct measures 11.3 mm which may be normal status post cholecystectomy. Recommend correlation with labs to ensure no clinical signs of biliary obstruction. 3. No other significant abnormalities. Electronically Signed   By: Dorise Bullion III M.D.   On: 12/19/2021 11:58    ASSESSMENT & PLAN [name] is a 71 y.o.female who presents to the clinic for iron deficiency anemia. She is currently taking ferrous sulfate 325 mg daily but reports GI intolerance with constipation and abdominal pain.  Likely cause of iron deficiency anemia is gastritis and varices in the setting of cirrhosis. She is under the care of gastroenterologist, Dr. Wilfrid Lund. She underwent EGD on 06/19/2021 showed grade I varices and healed gastric ulceration.  #Iron deficiency anemia 2/2 GI blood loss (gastritis and varices): --Currently on ferrous sulfate 325 mg daily. Due to GI intolerance, okay to discontinue --Recommend IV iron, will arrange for IV monoferric. Patient requested to receive IV iron at [facility] today to check CBC, CMP, retic panel, ferritin, iron and TIBC --Provided list of iron rich foods to incorporate into diet.  --RTC in 3 months with labs  #Lymphocytosis: --There is evidence of mild lymphocytosis that has been present since August 2022. Most recent labs show absolute  lymphocyte count of 5.9 --will order flow cytometry to rule out lymphoproliferative process   Orders Placed This Encounter  Procedures   CBC with Differential ([place] Only)    Standing Status:   Future    Number of Occurrences:   1    Standing Expiration Date:   12/25/2022   CMP ([place] only)    Standing Status:   Future    Number of Occurrences:   1    Standing Expiration Date:   12/25/2022   Lactate dehydrogenase (LDH)    Standing Status:   Future    Number of Occurrences:  1    Standing Expiration Date:   12/24/2022   Flow Cytometry, Peripheral Blood (Oncology)    Standing Status:   Future    Number of Occurrences:   1    Standing Expiration Date:   12/25/2022   Retic Panel    Standing Status:   Future    Number of Occurrences:   1    Standing Expiration Date:   12/25/2022   Iron and Iron Binding Capacity ([facility] only)    Standing Status:   Future    Number of Occurrences:   1    Standing Expiration Date:   12/25/2022    All questions were answered. The patient knows to call the clinic with any problems, questions or concerns.  I have spent a total of 60 minutes minutes of face-to-face and non-face-to-face time, preparing to see the patient, obtaining and/or reviewing separately obtained history, performing a medically appropriate examination, counseling and educating the patient, ordering medications/tests, documenting clinical information in the electronic health record,and care coordination.   Dede Query, PA-C Department of Hematology/Oncology [place] at [facility] Phone: [phone]  Patient was seen with Dr. Lorenso Courier  I have read the above note and personally examined the patient. I agree with the assessment and plan as noted above.  Briefly [name] is a 71 year old female who presents for evaluation of iron deficiency anemia.  At this time the etiology of the iron deficiency is thought to be GI blood loss from varices  and gastritis.  Of note the patient had an incidental lymphocytosis for which I would recommend we perform flow cytometry to rule out a monoclonal B-cell population.  The patient voiced understanding of the plan moving forward.   Ledell Peoples, MD Department of Hematology/Oncology [place] at [facility] Phone: [phone] Pager: [phone] Email: [email or web address]

## 2021-12-25 ENCOUNTER — Other Ambulatory Visit: Payer: Self-pay | Admitting: Physician Assistant

## 2021-12-25 ENCOUNTER — Telehealth: Payer: Self-pay

## 2021-12-25 ENCOUNTER — Encounter (HOSPITAL_COMMUNITY): Payer: Self-pay | Admitting: Physician Assistant

## 2021-12-25 LAB — SURGICAL PATHOLOGY

## 2021-12-25 NOTE — Telephone Encounter (Signed)
can you fax the labs from yesterday to Dr. Gar Ponto (PCP)  Labs faxed to [phone].  Confirmation received

## 2021-12-26 ENCOUNTER — Telehealth: Payer: Self-pay | Admitting: Physician Assistant

## 2021-12-26 ENCOUNTER — Telehealth: Payer: Self-pay | Admitting: Hematology and Oncology

## 2021-12-26 DIAGNOSIS — D500 Iron deficiency anemia secondary to blood loss (chronic): Secondary | ICD-10-CM

## 2021-12-26 DIAGNOSIS — C9110 Chronic lymphocytic leukemia of B-cell type not having achieved remission: Secondary | ICD-10-CM

## 2021-12-26 LAB — FLOW CYTOMETRY

## 2021-12-26 NOTE — Telephone Encounter (Signed)
Scheduled per 6/9 secure chat, message left with pt

## 2021-12-26 NOTE — Telephone Encounter (Signed)
I spoke to Ms. [name] to review the lab results from 12/24/2021. Findings show mild anemia with persistent iron deficiency. We will arrange for IV venofer 200 mg x 5 doses. 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  We will request prognostic panels and Dr. Lorenso Courier will follow up with patient in 2 weeks to discuss diagnosis and recommendations for treatment.

## 2022-01-02 ENCOUNTER — Other Ambulatory Visit: Payer: Self-pay

## 2022-01-02 ENCOUNTER — Inpatient Hospital Stay: Payer: Medicare HMO

## 2022-01-02 ENCOUNTER — Encounter (HOSPITAL_COMMUNITY): Payer: Self-pay | Admitting: Physician Assistant

## 2022-01-02 VITALS — BP 163/60 | HR 70 | Temp 98.3°F | Resp 17

## 2022-01-02 DIAGNOSIS — C9110 Chronic lymphocytic leukemia of B-cell type not having achieved remission: Secondary | ICD-10-CM

## 2022-01-02 DIAGNOSIS — D509 Iron deficiency anemia, unspecified: Secondary | ICD-10-CM | POA: Diagnosis not present

## 2022-01-02 DIAGNOSIS — D500 Iron deficiency anemia secondary to blood loss (chronic): Secondary | ICD-10-CM

## 2022-01-02 MED ORDER — SODIUM CHLORIDE 0.9 % IV SOLN: Freq: Once | INTRAVENOUS | Status: AC

## 2022-01-02 MED ORDER — SODIUM CHLORIDE 0.9 % IV SOLN
200.0000 mg | Freq: Once | INTRAVENOUS | Status: AC
  Administered 2022-01-02: 200 mg via INTRAVENOUS
  Filled 2022-01-02: qty 200

## 2022-01-02 NOTE — Patient Instructions (Signed)

## 2022-01-08 ENCOUNTER — Inpatient Hospital Stay: Payer: Medicare HMO | Admitting: Hematology and Oncology

## 2022-01-08 ENCOUNTER — Inpatient Hospital Stay: Payer: Medicare HMO

## 2022-01-08 ENCOUNTER — Other Ambulatory Visit: Payer: Self-pay

## 2022-01-08 VITALS — BP 126/51 | HR 56 | Temp 98.2°F | Resp 20

## 2022-01-08 VITALS — BP 148/54 | HR 60 | Temp 97.7°F | Resp 20 | Ht 62.0 in | Wt 160.8 lb

## 2022-01-08 DIAGNOSIS — D72820 Lymphocytosis (symptomatic): Secondary | ICD-10-CM | POA: Diagnosis not present

## 2022-01-08 DIAGNOSIS — D500 Iron deficiency anemia secondary to blood loss (chronic): Secondary | ICD-10-CM

## 2022-01-08 DIAGNOSIS — D509 Iron deficiency anemia, unspecified: Secondary | ICD-10-CM | POA: Diagnosis not present

## 2022-01-08 DIAGNOSIS — C9110 Chronic lymphocytic leukemia of B-cell type not having achieved remission: Secondary | ICD-10-CM | POA: Diagnosis not present

## 2022-01-08 LAB — FISH,CLL PROGNOSTIC PANEL

## 2022-01-08 MED ORDER — SODIUM CHLORIDE 0.9 % IV SOLN
200.0000 mg | Freq: Once | INTRAVENOUS | Status: AC
  Administered 2022-01-08: 200 mg via INTRAVENOUS
  Filled 2022-01-08: qty 200

## 2022-01-08 MED ORDER — ONDANSETRON HCL 8 MG PO TABS
8.0000 mg | ORAL_TABLET | Freq: Once | ORAL | Status: AC
  Administered 2022-01-08: 8 mg via ORAL
  Filled 2022-01-08: qty 1

## 2022-01-08 MED ORDER — SODIUM CHLORIDE 0.9 % IV SOLN: Freq: Once | INTRAVENOUS | Status: AC

## 2022-01-08 NOTE — Progress Notes (Signed)
[facility] [phone]   [phone]  PROGRESS NOTE  Patient Care Team: Richardean Chimera, MD as PCP - General (Family Medicine) Jonelle Sidle, MD as PCP - Cardiology (Cardiology) Jonelle Sidle, MD as Consulting Physician (Cardiology) Waymon Budge, MD as Consulting Physician (Pulmonary Disease) Dr. 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 Leonides Schanz and [name] 12/24/2021: flow cytometry confirms lymphocytosis is CLL.   Interval History:  [name] 71 y.o. female with medical history significant for iron deficiency anemia due to GI blood loss and newly diagnosed CLL who presents for a follow up visit. The patient's last visit was on 12/24/2021 at which time she established care. In the interim since the last visit initial blood evaluation showed confirmation of CLL.  On exam today [name] reports that she has continued to feel cold.  She particular feels cold during the day and at night.  She has had some occasional sweats and muscle aches.  She reports that she has been having the sweats for quite some time.  She reports that they are not drenching sweats.  She reports that she does occasionally have some abdominal cramps although she does also have a history of fibromyalgia.  Her weight has been stable.  Her she also notes that she been doing her best to eat greens and increased uptake of chicken livers.  She otherwise denies any fevers, chills, sweats, nausea, vomiting or diarrhea.  A full 10 point ROS is listed below.  The bulk of our discussion focused on the diagnosis of CLL and the steps moving forward.  The details of conversation are noted below.  MEDICAL HISTORY:  Past Medical History:  Diagnosis Date   Anxiety    Arthritis    Asthma    Barrett esophagus    Chronic bronchitis (HCC)     Chronic diarrhea    Chronic lower back pain    CLL (chronic lymphocytic leukemia) (HCC) 01/11/2022   Coronary atherosclerosis of native coronary artery    DES to LAD May 2014   Diverticulosis    Essential hypertension    Fatty liver    Fibromyalgia    GERD (gastroesophageal reflux disease)    H/O hiatal hernia    IBS (irritable bowel syndrome)    Major depression in partial remission (HCC)    Migraines    Mixed hyperlipidemia    Myocardial infarction (HCC)    2014   OSA on CPAP    Osteoporosis    Pancreatitis 2008   Pneumonia 1990's   Schatzki's ring    Sleep apnea    Type 2 diabetes mellitus (HCC)     SURGICAL HISTORY: Past Surgical History:  Procedure Laterality Date   66 HOUR PH STUDY N/A 02/16/2018   Procedure: 24 HOUR PH STUDY;  Surgeon: Napoleon Form, MD;  Location: [facility];  Service: Endoscopy;  Laterality: N/A;   ABDOMINAL HYSTERECTOMY  ?1987   APPENDECTOMY  ?1987   BIOPSY  03/02/2021   Procedure: BIOPSY;  Surgeon: Shellia Cleverly, DO;  Location: [facility];  Service: Gastroenterology;;   BUNIONECTOMY Bilateral 1972   CARPAL TUNNEL RELEASE Bilateral ?1990   CATARACT EXTRACTION W/ INTRAOCULAR LENS  IMPLANT, BILATERAL Bilateral ?2005   CESAREAN SECTION  1985   CHOLECYSTECTOMY  1990'S   COLONOSCOPY  10/13   Dr. Teena Dunk - tubular adenomas and diverticulosis  COLONOSCOPY WITH PROPOFOL N/A 08/28/2021   Procedure: COLONOSCOPY WITH PROPOFOL;  Surgeon: Meridee Score Netty Starring., MD;  Location: [facility] ENDOSCOPY;  Service: Gastroenterology;  Laterality: N/A;  ultra slim scope   DILATION AND CURETTAGE OF UTERUS  1970's   "probably 3" (12/09/2012)   ERCP  2008   ESOPHAGEAL DILATION  ` 2012   ESOPHAGEAL MANOMETRY N/A 10/21/2015   Procedure: ESOPHAGEAL MANOMETRY (EM);  Surgeon: Sherrilyn Rist, MD;  Location: [facility];  Service: Gastroenterology;  Laterality: N/A;   ESOPHAGEAL MANOMETRY N/A 02/16/2018   Procedure: ESOPHAGEAL MANOMETRY (EM);  Surgeon: Napoleon Form, MD;  Location: [facility];  Service: Endoscopy;  Laterality: N/A;   ESOPHAGOGASTRODUODENOSCOPY Left 03/02/2021   Procedure: ESOPHAGOGASTRODUODENOSCOPY (EGD);  Surgeon: Shellia Cleverly, DO;  Location: [facility] ENDOSCOPY;  Service: Gastroenterology;  Laterality: Left;   HUMERUS FRACTURE SURGERY Right 07/19/1969   "horst threw me" (12/09/2012)   INTRAVASCULAR PRESSURE WIRE/FFR STUDY N/A 05/12/2021   Procedure: INTRAVASCULAR PRESSURE WIRE/FFR STUDY;  Surgeon: Yvonne Kendall, MD;  [facility]: [facility];  Service: Cardiovascular;  Laterality: N/A;   LEFT HEART CATH AND CORONARY ANGIOGRAPHY N/A 12/29/2017   Procedure: LEFT HEART CATH AND CORONARY ANGIOGRAPHY;  Surgeon: Iran Ouch, MD;  Location: [facility];  Service: Cardiovascular;  Laterality: N/A;   LEFT HEART CATH AND CORONARY ANGIOGRAPHY N/A 05/12/2021   Procedure: LEFT HEART CATH AND CORONARY ANGIOGRAPHY;  Surgeon: Yvonne Kendall, MD;  Location: [facility];  Service: Cardiovascular;  Laterality: N/A;   NASAL SEPTUM SURGERY  1970's   PERCUTANEOUS CORONARY STENT INTERVENTION (PCI-S) N/A 12/09/2012   Procedure: PERCUTANEOUS CORONARY STENT INTERVENTION (PCI-S);  Surgeon: Kathleene Hazel, MD;  Location: [facility] CATH LAB;  Service: Cardiovascular;  Laterality: N/A;   PH IMPEDANCE STUDY N/A 02/16/2018   Procedure: PH IMPEDANCE STUDY;  Surgeon: Napoleon Form, MD;  Location: [facility];  Service: Endoscopy;  Laterality: N/A;   TOE FUSION Right 10/11   First MTP joint   TOE SURGERY Right 1990's   "toe next to big toe:  dr said I had tumor; cut bone & stuff; another OR dr stretched tendons, etc" (12/09/2012)   TUBAL LIGATION  1985    SOCIAL HISTORY: Social History   Socioeconomic History   Marital status: Married    Spouse name: [name]   Number of children: 2   Years of education: Not on file   Highest education level: Not on file  Occupational History   Occupation: Retired  D[employer]  Tobacco Use    Smoking status: Former    Packs/day: 0.80    Years: 15.00    Total pack years: 12.00    Types: Cigarettes    Start date: 07/20/1976    Quit date: 07/20/1993    Years since quitting: 28.4   Smokeless tobacco: Never  Vaping Use   Vaping Use: Never used  Substance and Sexual Activity   Alcohol use: No    Alcohol/week: 0.0 standard drinks of alcohol   Drug use: No   Sexual activity: Yes  Other Topics Concern   Not on file  Social History Narrative   Not on file   Social Determinants of Health   Financial Resource Strain: Not on file  Food Insecurity: Not on file  Transportation Needs: Not on file  Physical Activity: Not on file  Stress: Not on file  Social Connections: Not on file  Intimate Partner Violence: Not on file    FAMILY HISTORY: Family History  Problem Relation  Age of Onset   Stroke Mother    Cirrhosis Father    Hypertension Father    Esophageal cancer Sister    Colon polyps Sister        pre-cancerous, part of colon removed   Prostate cancer Brother    Colon cancer Neg Hx    Rectal cancer Neg Hx    Stomach cancer Neg Hx     ALLERGIES:  is allergic to erythromycin base, azithromycin, pollen extract, remeron [mirtazapine], zolpidem tartrate, other, sulfonamide derivatives, and tree extract.  MEDICATIONS:  Current Outpatient Medications  Medication Sig Dispense Refill   albuterol (PROVENTIL HFA;VENTOLIN HFA) 108 (90 Base) MCG/ACT inhaler Inhale 2 puffs into the lungs every 4 (four) hours as needed for shortness of breath (Asthma).  3   ALPRAZolam (XANAX) 1 MG tablet Take 1 mg by mouth at bedtime.  1   aspirin EC 81 MG tablet Take 81 mg by mouth daily.     atorvastatin (LIPITOR) 40 MG tablet Take 1 tablet (40 mg total) by mouth daily. 90 tablet 3   BIOTIN PO Take 1 tablet by mouth daily.     cetirizine (ZYRTEC) 10 MG tablet Take 10 mg by mouth daily as needed for allergies.     Cholecalciferol (VITAMIN D-3) 125 MCG (5000 UT) TABS Take 10,000 Units by mouth  daily.     dicyclomine (BENTYL) 10 MG capsule TAKE 1 CAPSULE (10 MG TOTAL) BY MOUTH 3 (THREE) TIMES DAILY BEFORE MEALS. 90 capsule 0   diltiazem (CARTIA XT) 120 MG 24 hr capsule Take 1 capsule (120 mg total) by mouth every morning. 90 capsule 3   DULoxetine (CYMBALTA) 60 MG capsule Take 60 mg by mouth daily.     HYDROcodone-acetaminophen (NORCO/VICODIN) 5-325 MG tablet Take 1 tablet by mouth 2 (two) times daily.  0   isosorbide mononitrate (IMDUR) 30 MG 24 hr tablet Take 1 tablet (30 mg total) by mouth daily. 90 tablet 1   losartan-hydrochlorothiazide (HYZAAR) 100-25 MG tablet Take 1 tablet by mouth daily. 90 tablet 3   metoprolol tartrate (LOPRESSOR) 25 MG tablet Take 1 tablet (25 mg total) by mouth 2 (two) times daily. 180 tablet 3   montelukast (SINGULAIR) 10 MG tablet Take 10 mg by mouth at bedtime.      Multiple Vitamins-Minerals (MULTIVITAMIN WITH MINERALS) tablet Take 1 tablet by mouth daily.     nitroGLYCERIN (NITROSTAT) 0.4 MG SL tablet Place 1 tablet (0.4 mg total) under the tongue every 5 (five) minutes x 3 doses as needed (if no relief after 3rd dose, proceed to the ED for an evaluation or call 911). 25 tablet 3   NOVOLOG FLEXPEN 100 UNIT/ML FlexPen Inject 0-30 Units into the skin 3 (three) times daily as needed for high blood sugar (If blood glucose is above 200).     omeprazole (PRILOSEC) 40 MG capsule Take 1 capsule (40 mg total) by mouth 2 (two) times daily. 180 capsule 2   ondansetron (ZOFRAN) 4 MG tablet TAKE 1 TABLET BY MOUTH EVERY 8 HOURS AS NEEDED FOR NAUSEA AND VOMITING (Patient taking differently: Taking 1/2 at bedtime) 45 tablet 2   promethazine (PHENERGAN) 25 MG tablet Take 25 mg by mouth every 6 (six) hours as needed for nausea or vomiting.     traZODone (DESYREL) 50 MG tablet Take 150 mg by mouth at bedtime.     TRESIBA FLEXTOUCH 200 UNIT/ML FlexTouch Pen Inject 40 Units into the skin daily. As directed (Patient taking differently: Inject 45 Units into the  skin 2 (two)  times daily. As directed Between novolog)     FEROSUL 325 (65 Fe) MG tablet Take 325 mg by mouth daily. (Patient not taking: Reported on 01/08/2022)     metoCLOPramide (REGLAN) 5 MG tablet Take 1 tablet 30-45 minutes before evening and AM doses of bowel preparation solution. (Patient not taking: Reported on 01/08/2022) 2 tablet 0   No current facility-administered medications for this visit.    REVIEW OF SYSTEMS:   Constitutional: ( - ) fevers, ( - )  chills , ( - ) night sweats Eyes: ( - ) blurriness of vision, ( - ) double vision, ( - ) watery eyes Ears, nose, mouth, throat, and face: ( - ) mucositis, ( - ) sore throat Respiratory: ( - ) cough, ( - ) dyspnea, ( - ) wheezes Cardiovascular: ( - ) palpitation, ( - ) chest discomfort, ( - ) lower extremity swelling Gastrointestinal:  ( - ) nausea, ( - ) heartburn, ( - ) change in bowel habits Skin: ( - ) abnormal skin rashes Lymphatics: ( - ) new lymphadenopathy, ( - ) easy bruising Neurological: ( - ) numbness, ( - ) tingling, ( - ) new weaknesses Behavioral/Psych: ( - ) mood change, ( - ) new changes  All other systems were reviewed with the patient and are negative.  PHYSICAL EXAMINATION: ECOG PERFORMANCE STATUS: 0 - Asymptomatic  Vitals:   01/08/22 0843  BP: (!) 148/54  Pulse: 60  Resp: 20  Temp: 97.7 F (36.5 C)  SpO2: 93%   Filed Weights   01/08/22 0843  Weight: 160 lb 12.8 oz (72.9 kg)    GENERAL: alert, no distress and comfortable SKIN: skin color, texture, turgor are normal, no rashes or significant lesions EYES: conjunctiva are pink and non-injected, sclera clear NECK: supple, non-tender LYMPH:  no palpable lymphadenopathy in the cervical, axillary or inguinal LUNGS: clear to auscultation and percussion with normal breathing effort HEART: regular rate & rhythm and no murmurs and no lower extremity edema ABDOMEN: soft, non-tender, non-distended, normal bowel sounds Musculoskeletal: no cyanosis of digits and no  clubbing  PSYCH: alert & oriented x 3, fluent speech NEURO: no focal motor/sensory deficits  LABORATORY DATA:  I have reviewed the data as listed    Latest Ref Rng & Units 12/24/2021    3:14 PM 12/09/2021    2:31 PM 10/30/2021   11:28 AM  CBC  WBC 4.0 - 10.5 K/uL 13.8  10.9  9.0   Hemoglobin 12.0 - 15.0 g/dL 40.9  81.1  91.4   Hematocrit 36.0 - 46.0 % 33.7  34.3  35.7   Platelets 150 - 400 K/uL 248  202.0  188.0        Latest Ref Rng & Units 12/24/2021    3:14 PM 12/09/2021    2:31 PM 04/15/2021   11:56 AM  CMP  Glucose 70 - 99 mg/dL 782   956   BUN 8 - 23 mg/dL 11   11   Creatinine 2.13 - 1.00 mg/dL 0.86   5.78   Sodium 469 - 145 mmol/L 137   136   Potassium 3.5 - 5.1 mmol/L 4.2   4.2   Chloride 98 - 111 mmol/L 99   99   CO2 22 - 32 mmol/L 29   30   Calcium 8.9 - 10.3 mg/dL 9.5   9.7   Total Protein 6.5 - 8.1 g/dL 6.9  7.0    Total Bilirubin 0.3 - 1.2 mg/dL 0.6  0.4    Alkaline Phos 38 - 126 U/L 70  99    AST 15 - 41 U/L 30  23    ALT 0 - 44 U/L 23  22      RADIOGRAPHIC STUDIES: US Abdomen Complete  Result Date: 12/19/2021 CLINICAL DATA:  Cirrhosis.  Cholecystectomy. EXAM: ABDOMEN ULTRASOUND COMPLETE COMPARISON:  Right upper quadrant ultrasound March 07, 2021. CT scan of the abdomen and pelvis March 01, 2021. FINDINGS: Gallbladder: Surgically absent Common bile duct: Diameter: 11.3 mm Liver: Mildly nodular contour. No suspicious mass. Heterogeneous echotexture. Portal vein is patent on color Doppler imaging with normal direction of blood flow towards the liver. IVC: No abnormality visualized. Pancreas: Visualized portion unremarkable. Spleen: Size and appearance within normal limits. Right Kidney: Length: 11.2 cm. There is a parapelvic cyst on the right, simple in appearance, of no clinical significance. No follow-up imaging recommended. Left Kidney: Length: 11.3 cm. Echogenicity within normal limits. No mass or hydronephrosis visualized. Abdominal aorta: No aneurysm visualized.  Other findings: None. IMPRESSION: 1. The liver demonstrates a cirrhotic morphology with a nodular contour and heterogeneous echotexture. No suspicious liver masses identified. 2. The common bile duct measures 11.3 mm which may be normal status post cholecystectomy. Recommend correlation with labs to ensure no clinical signs of biliary obstruction. 3. No other significant abnormalities. Electronically Signed   By: Gerome Sam III M.D.   On: 12/19/2021 11:58    ASSESSMENT & PLAN EMBERLIE WALTRIP 71 y.o. female with medical history significant for iron deficiency anemia due to GI blood loss and newly diagnosed CLL who presents for a follow up visit.  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 Due to GI intolerance, okay to discontinue --Recommend IV iron, patient currently receiving IV iron sucrose. Patient requested to receive [facility] iron at [facility].  --Provided list of iron rich foods to incorporate into diet.  --RTC in 3 months with labs   #CLL Rai Stage 0: --There is evidence of mild lymphocytosis that has been present since August 2022. Most recent labs show absolute lymphocyte count of 5.9 -- flow cytometry shows the presence of CLL. --Today we discussed the diagnosis and treatment options moving forward --Patient does have occasional sweats but I do not believe this is related to her CLL.  Could consider that a B symptoms start treatment if persistent or worsening --Indications for CLL treatment noted below. --No clear indication to start treatment at this time.  Recommend observation.  We will monitor with her iron deficiency anemia.   No orders of the defined types were placed in this encounter.   All questions were answered. The patient knows to call the clinic with any problems, questions or concerns.  A total of more than 30 minutes were spent on this encounter with face-to-face time and non-face-to-face  time, including preparing to see the patient, ordering tests and/or medications, counseling the patient and coordination of care as outlined above.   Ulysees Barns, MD Department of Hematology/Oncology [facility] Phone: [phone] Pager: [phone] Email: [email or web address]  01/11/2022 5:36 PM  Mathis Fare BD, Catovsky D, Caligaris-Cappio F, Dighiero G, Dhner H, [place], [place], [place] E, Chiorazzi N, Odem, Rai KR, [employer], Eichhorst B, O'Brien S, Robak T, Seymour JF, Kipps TJ. iwCLL guidelines for diagnosis, indications for treatment, response assessment, and supportive management of CLL. Blood. 2018 Jun 21;131(25):2745-2760.  Active  disease should be clearly documented to initiate therapy. At least 1 of the following criteria should be met.  1) Evidence of progressive marrow failure as manifested by the development of, or worsening of, anemia and/or thrombocytopenia. Cutoff levels of Hb <10 g/dL or platelet counts <350  109/L are generally regarded as indication for treatment. However, in some patients, platelet counts <100  109/L may remain stable over a long period; this situation does not automatically require therapeutic intervention. 2) Massive (ie, ?6 cm below the left costal margin) or progressive or symptomatic splenomegaly. 3) Massive nodes (ie, ?10 cm in longest diameter) or progressive or symptomatic lymphadenopathy. 4) Progressive lymphocytosis with an increase of ?50% over a 57-month period, or lymphocyte doubling time (LDT) <6 months. LDT can be obtained by linear regression extrapolation of absolute lymphocyte counts obtained at intervals of 2 weeks over an observation period of 2 to 3 months; patients with initial blood lymphocyte counts <30  109/L may require a longer observation period to determine the LDT. Factors contributing to lymphocytosis other than CLL (eg, infections, steroid administration)  should be excluded. 5) Autoimmune complications including anemia or thrombocytopenia poorly responsive to corticosteroids. 6) Symptomatic or functional extranodal involvement (eg, skin, kidney, lung, spine). Disease-related symptoms as defined by any of the following: Unintentional weight loss ?10% within the previous 6 months. Significant fatigue (ie, ECOG performance scale 2 or worse; cannot work or unable to perform usual activities). Fevers ?100.47F or 38.0C for 2 or more weeks without evidence of infection. Night sweats for ?1 month without evidence of infection.

## 2022-01-08 NOTE — Patient Instructions (Signed)

## 2022-01-09 LAB — IGVH SOMATIC HYPERMUTATION

## 2022-01-11 ENCOUNTER — Encounter: Payer: Self-pay | Admitting: Hematology and Oncology

## 2022-01-11 ENCOUNTER — Encounter (HOSPITAL_COMMUNITY): Payer: Self-pay | Admitting: Physician Assistant

## 2022-01-11 DIAGNOSIS — C9110 Chronic lymphocytic leukemia of B-cell type not having achieved remission: Secondary | ICD-10-CM | POA: Insufficient documentation

## 2022-01-11 HISTORY — DX: Chronic lymphocytic leukemia of B-cell type not having achieved remission: C91.10

## 2022-01-14 ENCOUNTER — Other Ambulatory Visit: Payer: Self-pay | Admitting: Gastroenterology

## 2022-01-16 ENCOUNTER — Other Ambulatory Visit: Payer: Self-pay

## 2022-01-16 ENCOUNTER — Other Ambulatory Visit: Payer: Self-pay | Admitting: Gastroenterology

## 2022-01-16 ENCOUNTER — Inpatient Hospital Stay: Payer: Medicare HMO

## 2022-01-16 VITALS — BP 134/52 | HR 58 | Temp 98.4°F | Resp 18

## 2022-01-16 DIAGNOSIS — D500 Iron deficiency anemia secondary to blood loss (chronic): Secondary | ICD-10-CM

## 2022-01-16 DIAGNOSIS — D509 Iron deficiency anemia, unspecified: Secondary | ICD-10-CM | POA: Diagnosis not present

## 2022-01-16 MED ORDER — SODIUM CHLORIDE 0.9 % IV SOLN: Freq: Once | INTRAVENOUS | Status: AC

## 2022-01-16 MED ORDER — SODIUM CHLORIDE 0.9 % IV SOLN
200.0000 mg | Freq: Once | INTRAVENOUS | Status: AC
  Administered 2022-01-16: 200 mg via INTRAVENOUS
  Filled 2022-01-16: qty 200

## 2022-01-16 NOTE — Progress Notes (Signed)
Patient tolerated IV iron infusion well. Monitored for 30 minute post observation period. VSS, ambulatory to lobby.

## 2022-01-16 NOTE — Patient Instructions (Signed)

## 2022-01-23 ENCOUNTER — Other Ambulatory Visit: Payer: Self-pay | Admitting: Physician Assistant

## 2022-01-23 ENCOUNTER — Telehealth: Payer: Self-pay

## 2022-01-23 ENCOUNTER — Inpatient Hospital Stay: Payer: Medicare HMO | Attending: Physician Assistant

## 2022-01-23 ENCOUNTER — Other Ambulatory Visit: Payer: Self-pay

## 2022-01-23 VITALS — BP 120/50 | HR 50 | Temp 98.3°F | Resp 16

## 2022-01-23 DIAGNOSIS — D509 Iron deficiency anemia, unspecified: Secondary | ICD-10-CM | POA: Insufficient documentation

## 2022-01-23 DIAGNOSIS — Z87891 Personal history of nicotine dependence: Secondary | ICD-10-CM | POA: Diagnosis not present

## 2022-01-23 DIAGNOSIS — D72820 Lymphocytosis (symptomatic): Secondary | ICD-10-CM | POA: Insufficient documentation

## 2022-01-23 DIAGNOSIS — D500 Iron deficiency anemia secondary to blood loss (chronic): Secondary | ICD-10-CM

## 2022-01-23 DIAGNOSIS — Z800 Family history of malignant neoplasm of digestive organs: Secondary | ICD-10-CM | POA: Insufficient documentation

## 2022-01-23 DIAGNOSIS — C9110 Chronic lymphocytic leukemia of B-cell type not having achieved remission: Secondary | ICD-10-CM

## 2022-01-23 MED ORDER — SODIUM CHLORIDE 0.9 % IV SOLN
200.0000 mg | Freq: Once | INTRAVENOUS | Status: AC
  Administered 2022-01-23: 200 mg via INTRAVENOUS
  Filled 2022-01-23: qty 200

## 2022-01-23 MED ORDER — SODIUM CHLORIDE 0.9 % IV SOLN: Freq: Once | INTRAVENOUS | Status: AC

## 2022-01-23 NOTE — Progress Notes (Signed)
Pt tolerated iron infusion well. Advised to call facility with concerns. Pt verbalized understanding.

## 2022-01-23 NOTE — Telephone Encounter (Signed)
T/C from pt asking if her 9/8 appt is the correct time frame for her return after receiving IV iron or does she need to be seen sooner.  It was discussed at her last appt to take magnesium. Is this OTC or prescription?  Please advise.

## 2022-01-26 NOTE — Telephone Encounter (Signed)
Pt advised and message sent to scheduling to add a lab appt  for 7/14 at 8 am.

## 2022-01-28 LAB — ZAP-70 PANEL (ASR)

## 2022-01-30 ENCOUNTER — Telehealth: Payer: Self-pay

## 2022-01-30 ENCOUNTER — Other Ambulatory Visit: Payer: Self-pay | Admitting: Physician Assistant

## 2022-01-30 ENCOUNTER — Inpatient Hospital Stay: Payer: Medicare HMO

## 2022-01-30 ENCOUNTER — Other Ambulatory Visit: Payer: Self-pay

## 2022-01-30 ENCOUNTER — Inpatient Hospital Stay (HOSPITAL_BASED_OUTPATIENT_CLINIC_OR_DEPARTMENT_OTHER): Payer: Medicare HMO

## 2022-01-30 VITALS — BP 149/59 | HR 62 | Temp 98.3°F | Resp 17

## 2022-01-30 DIAGNOSIS — D500 Iron deficiency anemia secondary to blood loss (chronic): Secondary | ICD-10-CM

## 2022-01-30 DIAGNOSIS — D509 Iron deficiency anemia, unspecified: Secondary | ICD-10-CM | POA: Diagnosis not present

## 2022-01-30 DIAGNOSIS — C9110 Chronic lymphocytic leukemia of B-cell type not having achieved remission: Secondary | ICD-10-CM

## 2022-01-30 LAB — MAGNESIUM: Magnesium: 1.6 mg/dL — ABNORMAL LOW (ref 1.7–2.4)

## 2022-01-30 MED ORDER — MAGNESIUM OXIDE -MG SUPPLEMENT 400 (240 MG) MG PO TABS: 400.0000 mg | ORAL_TABLET | Freq: Every day | ORAL | 3 refills | Status: DC

## 2022-01-30 MED ORDER — SODIUM CHLORIDE 0.9 % IV SOLN
200.0000 mg | Freq: Once | INTRAVENOUS | Status: AC
  Administered 2022-01-30: 200 mg via INTRAVENOUS
  Filled 2022-01-30: qty 200

## 2022-01-30 MED ORDER — SODIUM CHLORIDE 0.9 % IV SOLN: Freq: Once | INTRAVENOUS | Status: AC

## 2022-01-30 NOTE — Patient Instructions (Signed)

## 2022-01-30 NOTE — Telephone Encounter (Signed)
Pt advised by Daisy Blossom, RN while pt was in infusion.  HB

## 2022-01-30 NOTE — Telephone Encounter (Signed)
-----   Message from Lincoln Brigham, PA-C sent at 01/30/2022  8:55 AM EDT ----- Please notify patient her magnesium level is slightly below normal. I will send her a prescription for magnesium oxide that she take once a day.

## 2022-02-03 ENCOUNTER — Other Ambulatory Visit: Payer: Self-pay | Admitting: Cardiology

## 2022-02-04 ENCOUNTER — Ambulatory Visit: Payer: Medicare HMO | Admitting: Student

## 2022-02-04 ENCOUNTER — Encounter: Payer: Self-pay | Admitting: Student

## 2022-02-04 VITALS — BP 134/78 | HR 64 | Ht 62.0 in | Wt 164.8 lb

## 2022-02-04 DIAGNOSIS — I25118 Atherosclerotic heart disease of native coronary artery with other forms of angina pectoris: Secondary | ICD-10-CM | POA: Diagnosis not present

## 2022-02-04 DIAGNOSIS — I10 Essential (primary) hypertension: Secondary | ICD-10-CM

## 2022-02-04 DIAGNOSIS — D649 Anemia, unspecified: Secondary | ICD-10-CM

## 2022-02-04 DIAGNOSIS — E785 Hyperlipidemia, unspecified: Secondary | ICD-10-CM

## 2022-02-04 MED ORDER — ISOSORBIDE MONONITRATE ER 60 MG PO TB24: 60.0000 mg | ORAL_TABLET | Freq: Every day | ORAL | 3 refills | Status: DC

## 2022-02-04 NOTE — Progress Notes (Signed)
Cardiology Office Note    Date:  02/04/2022   ID:  HAWA HENLY, DOB Aug 31, 1950, MRN [id number]  PCP:  Caryl Bis, MD  Cardiologist: Rozann Lesches, MD    Chief Complaint  Patient presents with   Follow-up    Chest Pain    History of Present Illness:    [name] is a 70 y.o. female with past medical history of CAD (s/p DES to LAD in 2014, cath in 12/2017 showing patent LAD stent with mild to moderate RCA and LCx disease, cath in 04/2021 showing patent LAD stent with 60-70% RCA stenosis but not significant by FFR), HTN, HLD, Type 2 DM, IBS, anemia (prior gastric ulcer but healed by EGD in 06/2021), CLL and OSA who presents to the office today for evaluation of chest pain.   She was last examined by Dr. Domenic Polite in 05/2021 following her recent cardiac catheterization and reported some intermittent chest pain which was felt to be esophageal based off her history and recent reassuring cardiac work-up. She was continued on her current cardiac medications including ASA, Lopressor, Imdur, Hyzaar, Lipitor and PRN SL NTG.   In talking with the patient today, she reports having episodic chest pain which typically occurs at rest and will either spontaneously resolve or improve with the use of SL NTG. This has been occurring since her prior catheterization but has increased in frequency. Reports her breathing has overall been stable but she has experienced intermittent dyspnea since being diagnosed with anemia. No specific orthopnea, PND or pitting edema. She does use supplemental oxygen at night but does not need this during the day.  Past Medical History:  Diagnosis Date   Anxiety    Arthritis    Asthma    Barrett esophagus    Chronic bronchitis (HCC)    Chronic diarrhea    Chronic lower back pain    CLL (chronic lymphocytic leukemia) ([place]) 01/11/2022   Coronary atherosclerosis of native coronary artery    a. s/p DES to LAD in 2014 b. cath in 12/2017 showing patent LAD stent with  mild to moderate RCA and LCx disease c. cath in 04/2021 showing patent LAD stent with 60-70% RCA stenosis but not significant by FFR   Diverticulosis    Essential hypertension    Fatty liver    Fibromyalgia    GERD (gastroesophageal reflux disease)    H/O hiatal hernia    IBS (irritable bowel syndrome)    Major depression in partial remission ([place])    Migraines    Mixed hyperlipidemia    Myocardial infarction ([place])    2014   OSA on CPAP    Osteoporosis    Pancreatitis 2008   Pneumonia 1990's   Schatzki's ring    Sleep apnea    Type 2 diabetes mellitus ([place])     Past Surgical History:  Procedure Laterality Date   46 HOUR [place] STUDY N/A 02/16/2018   Procedure: 24 HOUR PH STUDY;  Surgeon: Mauri Pole, MD;  Location: [facility];  Service: Endoscopy;  Laterality: N/A;   ABDOMINAL HYSTERECTOMY  ?1987   APPENDECTOMY  ?1987   BIOPSY  03/02/2021   Procedure: BIOPSY;  Surgeon: Lavena Bullion, DO;  Location: [facility];  Service: Gastroenterology;;   BUNIONECTOMY Bilateral [place] Bilateral ?1990   CATARACT EXTRACTION W/ INTRAOCULAR LENS  IMPLANT, BILATERAL Bilateral ?2005   [place]  1990'S   COLONOSCOPY  10/13  Dr. Britta Mccreedy - tubular adenomas and diverticulosis   COLONOSCOPY WITH PROPOFOL N/A 08/28/2021   Procedure: COLONOSCOPY WITH PROPOFOL;  Surgeon: Rush Landmark [name], MD;  Location: [facility];  Service: Gastroenterology;  Laterality: N/A;  ultra slim scope   DILATION AND CURETTAGE OF UTERUS  1970's   "probably 3" (12/09/2012)   ERCP  2008   ESOPHAGEAL DILATION  ` 2012   ESOPHAGEAL MANOMETRY N/A 10/21/2015   Procedure: ESOPHAGEAL MANOMETRY (EM);  Surgeon: Doran Stabler, MD;  Location: [facility];  Service: Gastroenterology;  Laterality: N/A;   ESOPHAGEAL MANOMETRY N/A 02/16/2018   Procedure: ESOPHAGEAL MANOMETRY (EM);  Surgeon: Mauri Pole, MD;  Location: [facility];  Service: Endoscopy;   Laterality: N/A;   ESOPHAGOGASTRODUODENOSCOPY Left 03/02/2021   Procedure: ESOPHAGOGASTRODUODENOSCOPY (EGD);  Surgeon: Lavena Bullion, DO;  Location: [facility];  Service: Gastroenterology;  Laterality: Left;   HUMERUS FRACTURE SURGERY Right 07/19/1969   "horst threw me" (12/09/2012)   INTRAVASCULAR PRESSURE WIRE/FFR STUDY N/A 05/12/2021   Procedure: INTRAVASCULAR PRESSURE WIRE/FFR STUDY;  Surgeon: Nelva Bush, MD;  Location: [facility];  Service: Cardiovascular;  Laterality: N/A;   LEFT HEART CATH AND CORONARY ANGIOGRAPHY N/A 12/29/2017   Procedure: LEFT HEART CATH AND CORONARY ANGIOGRAPHY;  Surgeon: Wellington Hampshire, MD;  Location: [facility];  Service: Cardiovascular;  Laterality: N/A;   LEFT HEART CATH AND CORONARY ANGIOGRAPHY N/A 05/12/2021   Procedure: LEFT HEART CATH AND CORONARY ANGIOGRAPHY;  Surgeon: Nelva Bush, MD;  Location: [facility];  Service: Cardiovascular;  Laterality: N/A;   NASAL SEPTUM SURGERY  1970's   PERCUTANEOUS CORONARY STENT INTERVENTION (PCI-S) N/A 12/09/2012   Procedure: PERCUTANEOUS CORONARY STENT INTERVENTION (PCI-S);  Surgeon: Burnell Blanks, MD;  Location[facility];  Service: Cardiovascular;  Laterality: N/A;   [place] IMPEDANCE STUDY N/A 02/16/2018   Procedure: [place] IMPEDANCE STUDY;  Surgeon: Mauri Pole, MD;  Location: [facility];  Service: Endoscopy;  Laterality: N/A;   TOE FUSION Right 10/11   First MTP joint   TOE SURGERY Right 1990's   "toe next to big toe:  dr said I had tumor; cut bone & stuff; another OR dr stretched tendons, etc" (12/09/2012)   TUBAL LIGATION  1985    Current Medications: Outpatient Medications Prior to Visit  Medication Sig Dispense Refill   albuterol (PROVENTIL HFA;VENTOLIN HFA) 108 (90 Base) MCG/ACT inhaler Inhale 2 puffs into the lungs every 4 (four) hours as needed for shortness of breath (Asthma).  3   ALPRAZolam (XANAX) 1 MG tablet Take 1 mg by mouth at bedtime.  1   aspirin  EC 81 MG tablet Take 81 mg by mouth daily.     atorvastatin (LIPITOR) 40 MG tablet Take 1 tablet (40 mg total) by mouth daily. 90 tablet 3   BIOTIN PO Take 1 tablet by mouth daily.     cetirizine (ZYRTEC) 10 MG tablet Take 10 mg by mouth daily as needed for allergies.     Cholecalciferol (VITAMIN D-3) 125 MCG (5000 UT) TABS Take 10,000 Units by mouth daily.     dicyclomine (BENTYL) 10 MG capsule TAKE 1 CAPSULE (10 MG TOTAL) BY MOUTH 3 (THREE) TIMES DAILY BEFORE MEALS. 270 capsule 1   diltiazem (CARDIZEM CD) 120 MG 24 hr capsule TAKE 1 CAPSULE (120 MG TOTAL) BY MOUTH EVERY MORNING. 90 capsule 0   DULoxetine (CYMBALTA) 60 MG capsule Take 60 mg by mouth daily.     HYDROcodone-acetaminophen (NORCO/VICODIN) 5-325 MG tablet Take  1 tablet by mouth 2 (two) times daily.  0   losartan-hydrochlorothiazide (HYZAAR) 100-25 MG tablet Take 1 tablet by mouth daily. 90 tablet 3   magnesium oxide (MAG-OX) 400 (240 Mg) MG tablet Take 1 tablet (400 mg total) by mouth daily. 30 tablet 3   metoprolol tartrate (LOPRESSOR) 25 MG tablet Take 1 tablet (25 mg total) by mouth 2 (two) times daily. 180 tablet 3   montelukast (SINGULAIR) 10 MG tablet Take 10 mg by mouth at bedtime.      Multiple Vitamins-Minerals (MULTIVITAMIN WITH MINERALS) tablet Take 1 tablet by mouth daily.     nitroGLYCERIN (NITROSTAT) 0.4 MG SL tablet Place 1 tablet (0.4 mg total) under the tongue every 5 (five) minutes x 3 doses as needed (if no relief after 3rd dose, proceed to the ED for an evaluation or call 911). 25 tablet 3   NOVOLOG FLEXPEN 100 UNIT/ML FlexPen Inject 0-30 Units into the skin 3 (three) times daily as needed for high blood sugar (If blood glucose is above 200).     omeprazole (PRILOSEC) 40 MG capsule Take 1 capsule (40 mg total) by mouth 2 (two) times daily. 180 capsule 2   ondansetron (ZOFRAN) 4 MG tablet TAKE 1 TABLET BY MOUTH EVERY 8 HOURS AS NEEDED FOR NAUSEA AND VOMITING (Patient taking differently: Taking 1/2 at bedtime) 45  tablet 2   promethazine (PHENERGAN) 25 MG tablet Take 25 mg by mouth every 6 (six) hours as needed for nausea or vomiting.     traZODone (DESYREL) 50 MG tablet Take 150 mg by mouth at bedtime.     TRESIBA FLEXTOUCH 200 UNIT/ML FlexTouch Pen Inject 40 Units into the skin daily. As directed (Patient taking differently: Inject 45 Units into the skin 2 (two) times daily. As directed Between novolog)     isosorbide mononitrate (IMDUR) 30 MG 24 hr tablet Take 1 tablet (30 mg total) by mouth daily. 90 tablet 1   metoCLOPramide (REGLAN) 5 MG tablet Take 1 tablet 30-45 minutes before evening and AM doses of bowel preparation solution. (Patient not taking: Reported on 01/08/2022) 2 tablet 0   No facility-administered medications prior to visit.     Allergies:   Erythromycin base, Azithromycin, Pollen extract, Remeron [mirtazapine], Zolpidem tartrate, Other, Sulfonamide derivatives, and Tree extract   Social History   Socioeconomic History   Marital status: Married    Spouse name: [name]   Number of children: 2   Years of education: Not on file   Highest education level: Not on file  Occupational History   Occupation: Retired  D[employer]  Tobacco Use   Smoking status: Former    Packs/day: 0.80    Years: 15.00    Total pack years: 12.00    Types: Cigarettes    Start date: 07/20/1976    Quit date: 07/20/1993    Years since quitting: 28.5   Smokeless tobacco: Never  Vaping Use   Vaping Use: Never used  Substance and Sexual Activity   Alcohol use: No    Alcohol/week: 0.0 standard drinks of alcohol   Drug use: No   Sexual activity: Yes  Other Topics Concern   Not on file  Social History Narrative   Not on file   Social Determinants of Health   Financial Resource Strain: Not on file  Food Insecurity: Not on file  Transportation Needs: Not on file  Physical Activity: Not on file  Stress: Not on file  Social Connections: Not on file     Family  History:  The patient's family history includes  Cirrhosis in her father; Colon polyps in her sister; Esophageal cancer in her sister; Hypertension in her father; Prostate cancer in her brother; Stroke in her mother.   Review of Systems:    Please see the history of present illness.     All other systems reviewed and are otherwise negative except as noted above.   Physical Exam:    VS:  BP 134/78   Pulse 64   Ht '5\' 2"'$  (1.575 m)   Wt 164 lb 12.8 oz (74.8 kg)   SpO2 94%   BMI 30.14 kg/m    General: Well developed, well nourished,female appearing in no acute distress. Head: Normocephalic, atraumatic. Neck: No carotid bruits. JVD not elevated.  Lungs: Respirations regular and unlabored, without wheezes or rales.  Heart: Regular rate and rhythm. No S3 or S4.  No murmur, no rubs, or gallops appreciated. Abdomen: Appears non-distended. No obvious abdominal masses. Msk:  Strength and tone appear normal for age. No obvious joint deformities or effusions. Extremities: No clubbing or cyanosis. No pitting edema.  Distal pedal pulses are 2+ bilaterally. Neuro: Alert and oriented X 3. Moves all extremities spontaneously. No focal deficits noted. Psych:  Responds to questions appropriately with a normal affect. Skin: No rashes or lesions noted  Wt Readings from Last 3 Encounters:  02/04/22 164 lb 12.8 oz (74.8 kg)  01/08/22 160 lb 12.8 oz (72.9 kg)  12/24/21 162 lb 9.6 oz (73.8 kg)     Studies/Labs Reviewed:   EKG:  EKG is ordered today.  The ekg ordered today demonstrates NSR, HR 64 with no acute ST changes.   Recent Labs: 12/24/2021: ALT 23; BUN 11; Creatinine 0.84; Hemoglobin 11.3; Platelet Count 248; Potassium 4.2; Sodium 137 01/30/2022: Magnesium 1.6   Lipid Panel    Component Value Date/Time   CHOL 160 12/29/2017 0258   TRIG 191 (H) 12/29/2017 0258   HDL 40 (L) 12/29/2017 0258   CHOLHDL 4.0 12/29/2017 0258   VLDL 38 12/29/2017 0258   LDLCALC 82 12/29/2017 0258    Additional studies/ records that were reviewed today  include:   Echocardiogram: 02/2021 IMPRESSIONS     1. Left ventricular ejection fraction, by estimation, is 60 to 65%. The  left ventricle has normal function. The left ventricle has no regional  wall motion abnormalities. There is mild left ventricular hypertrophy.  Left ventricular diastolic parameters  are consistent with Grade II diastolic dysfunction (pseudonormalization).  Elevated left ventricular end-diastolic pressure.   2. Right ventricular systolic function is normal. The right ventricular  size is normal.   3. The mitral valve is abnormal. Mild mitral valve regurgitation. No  evidence of mitral stenosis.   4. The aortic valve is tricuspid. There is moderate calcification of the  aortic valve. There is moderate thickening of the aortic valve. Aortic  valve regurgitation is not visualized. Mild aortic valve stenosis.   5. The inferior vena cava is normal in size with greater than 50%  respiratory variability, suggesting right atrial pressure of 3 mmHg.   LHC: 04/2021 Conclusions: Mild-moderate, non-obstructive coronary artery disease.  The most severe stenosis is a 60-70% lesion in the mid RCA that is not hemodynamically significant (RFR = 0.94), though it has worsened since 2019.  There is no severe disease in the LAD territory to explain perfusion defect on recent stress test. Widely patent mid LAD stent. Normal left ventricular contraction (LVEF 55-65%) with mildly elevated filling pressure (LVEDP 20-25 mmHg).  Recommendations: Continue medical therapy for secondary prevention of CAD and antianginal therapy.  Assessment:    1. Atherosclerosis of native coronary artery of native heart with stable angina pectoris ([place])   2. Essential hypertension   3. Hyperlipidemia LDL goal <70   4. Symptomatic anemia      Plan:   In order of problems listed above:  1. CAD - She is s/p DES to LAD in 2014 and cath in 12/2017 showed a patent LAD stent with mild to moderate RCA  and LCx disease and recent cath in 04/2021 showed a patent LAD stent with 60-70% RCA stenosis but not significant by FFR. - Reviewed options with the patient and given her recent catheterization in the setting of a false positive stress test, would initially focus on medical management. She also has multiple medical issues which could be contributing including prior gastric ulcer, IBS, anemia and CLL. Her pain has been responsive to nitroglycerin and will plan to titrate Imdur from 30 mg daily to 60 mg daily. We reviewed that if she has a headache with this, she could try taking 45 mg daily initially. Continue ASA 81 mg daily, Atorvastatin 40 mg daily and Lopressor 25 mg twice daily.  2. HTN - Her blood pressure is well controlled at 134/78 during today's visit. Continue current medication regimen with Cardizem CD 120 mg daily, Losartan-HCTZ 100-25 mg daily and Lopressor 25 mg twice daily.  3. HLD - Followed by her PCP. FLP in 08/2021 showed total cholesterol 165, triglycerides 66, HDL 72 and LDL 80.  She remains on Atorvastatin 40 mg daily. If LDL remains above goal on her next check, would recommend titration of Atorvastatin to 80 mg daily.  4. Anemia/CLL - Followed by Heme/Onc. She has been receiving iron transfusions. Hgb was at 11.3 by recent labs in 12/2021.   Medication Adjustments/Labs and Tests Ordered: Current medicines are reviewed at length with the patient today.  Concerns regarding medicines are outlined above.  Medication changes, Labs and Tests ordered today are listed in the Patient Instructions below. Patient Instructions  Medication Instructions:  INCREASE Imdur to 60 mg daily in the morning    Labwork: None today  Testing/Procedures: None today  Follow-Up: 3-4 months [place] with Dr.McDowell  Any Other Special Instructions Will Be Listed Below (If Applicable).  If you need a refill on your cardiac medications before your next appointment, please call your pharmacy.     Signed, Erma Heritage, PA-C  02/04/2022 5:15 PM    [facility] [address] [place], [place] [address] Phone: [phone] Fax: [phone]

## 2022-02-04 NOTE — Patient Instructions (Signed)
Medication Instructions:  INCREASE Imdur to 60 mg daily in the morning    Labwork: None today  Testing/Procedures: None today  Follow-Up: 3-4 months EDEN with Dr.McDowell  Any Other Special Instructions Will Be Listed Below (If Applicable).  If you need a refill on your cardiac medications before your next appointment, please call your pharmacy.

## 2022-02-06 ENCOUNTER — Telehealth: Payer: Self-pay

## 2022-02-06 NOTE — Telephone Encounter (Signed)
T/C from pt requesting a letter of medical clearance be sent to her Dentist at [facility].  She needs to have either a root canal or tooth extraction.  Per Murray Hodgkins: okay to proceed with either dental procedures since she is not on active treatment.   Letter faxed and confirmation received.

## 2022-03-26 ENCOUNTER — Other Ambulatory Visit: Payer: Self-pay | Admitting: Physician Assistant

## 2022-03-26 DIAGNOSIS — D500 Iron deficiency anemia secondary to blood loss (chronic): Secondary | ICD-10-CM

## 2022-03-26 DIAGNOSIS — C9110 Chronic lymphocytic leukemia of B-cell type not having achieved remission: Secondary | ICD-10-CM

## 2022-03-27 ENCOUNTER — Other Ambulatory Visit: Payer: Self-pay | Admitting: *Deleted

## 2022-03-27 ENCOUNTER — Inpatient Hospital Stay: Payer: Medicare HMO | Attending: Physician Assistant

## 2022-03-27 ENCOUNTER — Other Ambulatory Visit: Payer: Self-pay

## 2022-03-27 ENCOUNTER — Inpatient Hospital Stay: Payer: Medicare HMO | Admitting: Physician Assistant

## 2022-03-27 ENCOUNTER — Other Ambulatory Visit: Payer: Self-pay | Admitting: Physician Assistant

## 2022-03-27 VITALS — BP 140/40 | HR 60 | Temp 98.2°F | Resp 19 | Ht 62.0 in | Wt 161.8 lb

## 2022-03-27 DIAGNOSIS — C9110 Chronic lymphocytic leukemia of B-cell type not having achieved remission: Secondary | ICD-10-CM | POA: Insufficient documentation

## 2022-03-27 DIAGNOSIS — Z800 Family history of malignant neoplasm of digestive organs: Secondary | ICD-10-CM | POA: Diagnosis not present

## 2022-03-27 DIAGNOSIS — Z87891 Personal history of nicotine dependence: Secondary | ICD-10-CM | POA: Insufficient documentation

## 2022-03-27 DIAGNOSIS — D649 Anemia, unspecified: Secondary | ICD-10-CM

## 2022-03-27 DIAGNOSIS — R110 Nausea: Secondary | ICD-10-CM | POA: Insufficient documentation

## 2022-03-27 DIAGNOSIS — D500 Iron deficiency anemia secondary to blood loss (chronic): Secondary | ICD-10-CM | POA: Insufficient documentation

## 2022-03-27 DIAGNOSIS — K922 Gastrointestinal hemorrhage, unspecified: Secondary | ICD-10-CM | POA: Diagnosis not present

## 2022-03-27 LAB — IRON AND IRON BINDING CAPACITY (CC-WL,HP ONLY)
Iron: 43 ug/dL (ref 28–170)
Saturation Ratios: 9 % — ABNORMAL LOW (ref 10.4–31.8)
TIBC: 468 ug/dL — ABNORMAL HIGH (ref 250–450)
UIBC: 425 ug/dL (ref 148–442)

## 2022-03-27 LAB — CMP (CANCER CENTER ONLY)
ALT: 16 U/L (ref 0–44)
AST: 21 U/L (ref 15–41)
Albumin: 3.8 g/dL (ref 3.5–5.0)
Alkaline Phosphatase: 79 U/L (ref 38–126)
Anion gap: 5 (ref 5–15)
BUN: 10 mg/dL (ref 8–23)
CO2: 31 mmol/L (ref 22–32)
Calcium: 9.7 mg/dL (ref 8.9–10.3)
Chloride: 98 mmol/L (ref 98–111)
Creatinine: 0.71 mg/dL (ref 0.44–1.00)
GFR, Estimated: >60 mL/min (ref >60)
Glucose, Bld: 254 mg/dL — ABNORMAL HIGH (ref 70–99)
Potassium: 3.8 mmol/L (ref 3.5–5.1)
Sodium: 134 mmol/L — ABNORMAL LOW (ref 135–145)
Total Bilirubin: 0.4 mg/dL (ref 0.3–1.2)
Total Protein: 6.5 g/dL (ref 6.5–8.1)

## 2022-03-27 LAB — VITAMIN B12: Vitamin B-12: 630 pg/mL (ref 180–914)

## 2022-03-27 LAB — CBC WITH DIFFERENTIAL (CANCER CENTER ONLY)
Abs Immature Granulocytes: 0.01 10*3/uL (ref 0.00–0.07)
Basophils Absolute: 0.1 10*3/uL (ref 0.0–0.1)
Basophils Relative: 1 %
Eosinophils Absolute: 0.7 10*3/uL — ABNORMAL HIGH (ref 0.0–0.5)
Eosinophils Relative: 6 %
HCT: 29.2 % — ABNORMAL LOW (ref 36.0–46.0)
Hemoglobin: 9.8 g/dL — ABNORMAL LOW (ref 12.0–15.0)
Immature Granulocytes: 0 %
Lymphocytes Relative: 55 %
Lymphs Abs: 6.3 10*3/uL — ABNORMAL HIGH (ref 0.7–4.0)
MCH: 31.5 pg (ref 26.0–34.0)
MCHC: 33.6 g/dL (ref 30.0–36.0)
MCV: 93.9 fL (ref 80.0–100.0)
Monocytes Absolute: 0.8 10*3/uL (ref 0.1–1.0)
Monocytes Relative: 7 %
Neutro Abs: 3.4 10*3/uL (ref 1.7–7.7)
Neutrophils Relative %: 31 %
Platelet Count: 222 10*3/uL (ref 150–400)
RBC: 3.11 MIL/uL — ABNORMAL LOW (ref 3.87–5.11)
RDW: 12.8 % (ref 11.5–15.5)
WBC Count: 11.3 10*3/uL — ABNORMAL HIGH (ref 4.0–10.5)
nRBC: 0 % (ref 0.0–0.2)

## 2022-03-27 LAB — FERRITIN: Ferritin: 8 ng/mL — ABNORMAL LOW (ref 11–307)

## 2022-03-27 LAB — FOLATE: Folate: 24.4 ng/mL (ref >5.9)

## 2022-03-27 NOTE — Progress Notes (Signed)
[place] [phone]   [phone]  PROGRESS NOTE  Patient Care Team: Caryl Bis, MD as PCP - General (Family Medicine) [name], MD as PCP - Cardiology (Cardiology) [name], MD as Consulting Physician (Cardiology) Deneise Lever, MD as Consulting Physician (Pulmonary Disease) Dr. 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 Lorenso Courier and Dede Query 12/24/2021: flow cytometry confirms lymphocytosis is CLL.   Interval History:  [name] 71 y.o. female with medical history significant for iron deficiency anemia due to GI blood loss and CLL who presents for a follow up visit. The patient's last visit was on 01/08/2022 by Dr. Lorenso Courier. In the interim since the last visit, she completed her IV iron infusions.   On exam today [name] reports her energy levels have declined some since last visit.  She has noticed fatigue although she can complete complete her daily activities on her own.  She has a good appetite and denies any significant weight changes.  She reports intermittent episodes of nausea improves with prescribed antiemetic  She reports improvement of abdominal pain.  She reports having soft stools and occasional episodes of diarrhea with taking magnesium supplements.  She denies easy bruising or signs of active bleeding.  Patient reports her night sweats have improved.  She denies fevers, chills, shortness of breath, chest pain or cough.  She has no other complaints.  A full 10 point ROS is listed below.  MEDICAL HISTORY:  Past Medical History:  Diagnosis Date   Anxiety    Arthritis    Asthma    Barrett esophagus    Chronic bronchitis (HCC)    Chronic diarrhea    Chronic lower back pain    CLL (chronic lymphocytic  leukemia) ([place]) 01/11/2022   Coronary atherosclerosis of native coronary artery    a. s/p DES to LAD in 2014 b. cath in 12/2017 showing patent LAD stent with mild to moderate RCA and LCx disease c. cath in 04/2021 showing patent LAD stent with 60-70% RCA stenosis but not significant by FFR   Diverticulosis    Essential hypertension    Fatty liver    Fibromyalgia    GERD (gastroesophageal reflux disease)    H/O hiatal hernia    IBS (irritable bowel syndrome)    Major depression in partial remission ([place])    Migraines    Mixed hyperlipidemia    Myocardial infarction ([place])    2014   OSA on CPAP    Osteoporosis    Pancreatitis 2008   Pneumonia 1990's   Schatzki's ring    Sleep apnea    Type 2 diabetes mellitus ([place])     SURGICAL HISTORY: Past Surgical History:  Procedure Laterality Date   39 HOUR [place] STUDY N/A 02/16/2018   Procedure: 24 HOUR PH STUDY;  Surgeon: Mauri Pole, MD;  Location: [facility];  Service: Endoscopy;  Laterality: N/A;   ABDOMINAL HYSTERECTOMY  ?1987   APPENDECTOMY  ?1987   BIOPSY  03/02/2021   Procedure: BIOPSY;  Surgeon: Lavena Bullion, DO;  Location: [facility];  Service: Gastroenterology;;   BUNIONECTOMY Bilateral [place] Bilateral ?1990   CATARACT EXTRACTION W/ INTRAOCULAR LENS  IMPLANT, BILATERAL Bilateral ?2005   [place]  1990'S   COLONOSCOPY  10/13   Dr. Britta Mccreedy - tubular adenomas and diverticulosis   COLONOSCOPY WITH PROPOFOL N/A 08/28/2021   Procedure: COLONOSCOPY WITH PROPOFOL;  Surgeon: Rush Landmark [name], MD;  Location: [facility];  Service: Gastroenterology;  Laterality: N/A;  ultra slim scope   DILATION AND CURETTAGE OF UTERUS  1970's   "probably 3" (12/09/2012)   ERCP  2008   ESOPHAGEAL DILATION  ` 2012   ESOPHAGEAL MANOMETRY N/A 10/21/2015   Procedure: ESOPHAGEAL MANOMETRY (EM);  Surgeon: Doran Stabler, MD;  Location: [facility];  Service: Gastroenterology;   Laterality: N/A;   ESOPHAGEAL MANOMETRY N/A 02/16/2018   Procedure: ESOPHAGEAL MANOMETRY (EM);  Surgeon: Mauri Pole, MD;  Location: [facility];  Service: Endoscopy;  Laterality: N/A;   ESOPHAGOGASTRODUODENOSCOPY Left 03/02/2021   Procedure: ESOPHAGOGASTRODUODENOSCOPY (EGD);  Surgeon: Lavena Bullion, DO;  Location: [facility];  Service: Gastroenterology;  Laterality: Left;   HUMERUS FRACTURE SURGERY Right 07/19/1969   "horst threw me" (12/09/2012)   INTRAVASCULAR PRESSURE WIRE/FFR STUDY N/A 05/12/2021   Procedure: INTRAVASCULAR PRESSURE WIRE/FFR STUDY;  Surgeon: Nelva Bush, MD;  Location: [facility];  Service: Cardiovascular;  Laterality: N/A;   LEFT HEART CATH AND CORONARY ANGIOGRAPHY N/A 12/29/2017   Procedure: LEFT HEART CATH AND CORONARY ANGIOGRAPHY;  Surgeon: Wellington Hampshire, MD;  Location: [facility];  Service: Cardiovascular;  Laterality: N/A;   LEFT HEART CATH AND CORONARY ANGIOGRAPHY N/A 05/12/2021   Procedure: LEFT HEART CATH AND CORONARY ANGIOGRAPHY;  Surgeon: Nelva Bush, MD;  Location: [facility];  Service: Cardiovascular;  Laterality: N/A;   NASAL SEPTUM SURGERY  1970's   PERCUTANEOUS CORONARY STENT INTERVENTION (PCI-S) N/A 12/09/2012   Procedure: PERCUTANEOUS CORONARY STENT INTERVENTION (PCI-S);  Surgeon: Burnell Blanks, MD;  Location[facility];  Service: Cardiovascular;  Laterality: N/A;   [place] IMPEDANCE STUDY N/A 02/16/2018   Procedure: [place] IMPEDANCE STUDY;  Surgeon: Mauri Pole, MD;  Location: [facility];  Service: Endoscopy;  Laterality: N/A;   TOE FUSION Right 10/11   First MTP joint   TOE SURGERY Right 1990's   "toe next to big toe:  dr said I had tumor; cut bone & stuff; another OR dr stretched tendons, etc" (12/09/2012)   TUBAL LIGATION  1985    SOCIAL HISTORY: Social History   Socioeconomic History   Marital status: Married    Spouse name: [name]   Number of children: 2   Years of education: Not on  file   Highest education level: Not on file  Occupational History   Occupation: Retired  [employer]  Tobacco Use   Smoking status: Former    Packs/day: 0.80    Years: 15.00    Total pack years: 12.00    Types: Cigarettes    Start date: 07/20/1976    Quit date: 07/20/1993    Years since quitting: 28.7   Smokeless tobacco: Never  Vaping Use   Vaping Use: Never used  Substance and Sexual Activity   Alcohol use: No    Alcohol/week: 0.0 standard drinks of alcohol   Drug use: No   Sexual activity: Yes  Other Topics Concern   Not on file  Social History Narrative   Not on file   Social Determinants of Health   Financial Resource Strain: Not on file  Food Insecurity: Not on file  Transportation Needs: Not on file  Physical Activity: Not on file  Stress: Not on file  Social Connections: Not on file  Intimate Partner Violence: Not  on file    FAMILY HISTORY: Family History  Problem Relation Age of Onset   Stroke Mother    Cirrhosis Father    Hypertension Father    Esophageal cancer Sister    Colon polyps Sister        pre-cancerous, part of colon removed   Prostate cancer Brother    Colon cancer Neg Hx    Rectal cancer Neg Hx    Stomach cancer Neg Hx     ALLERGIES:  is allergic to erythromycin base, azithromycin, pollen extract, remeron [mirtazapine], zolpidem tartrate, other, sulfonamide derivatives, and tree extract.  MEDICATIONS:  Current Outpatient Medications  Medication Sig Dispense Refill   albuterol (PROVENTIL HFA;VENTOLIN HFA) 108 (90 Base) MCG/ACT inhaler Inhale 2 puffs into the lungs every 4 (four) hours as needed for shortness of breath (Asthma).  3   ALPRAZolam (XANAX) 1 MG tablet Take 1 mg by mouth at bedtime.  1   aspirin EC 81 MG tablet Take 81 mg by mouth daily.     atorvastatin (LIPITOR) 40 MG tablet Take 1 tablet (40 mg total) by mouth daily. 90 tablet 3   BIOTIN PO Take 1 tablet by mouth daily.     cetirizine (ZYRTEC) 10 MG tablet Take 10 mg by mouth daily  as needed for allergies.     Cholecalciferol (VITAMIN D-3) 125 MCG (5000 UT) TABS Take 10,000 Units by mouth daily.     dicyclomine (BENTYL) 10 MG capsule TAKE 1 CAPSULE (10 MG TOTAL) BY MOUTH 3 (THREE) TIMES DAILY BEFORE MEALS. 270 capsule 1   diltiazem (CARDIZEM CD) 120 MG 24 hr capsule TAKE 1 CAPSULE (120 MG TOTAL) BY MOUTH EVERY MORNING. 90 capsule 0   DULoxetine (CYMBALTA) 60 MG capsule Take 60 mg by mouth daily.     HYDROcodone-acetaminophen (NORCO/VICODIN) 5-325 MG tablet Take 1 tablet by mouth 2 (two) times daily.  0   isosorbide mononitrate (IMDUR) 60 MG 24 hr tablet Take 1 tablet (60 mg total) by mouth daily. 90 tablet 3   losartan-hydrochlorothiazide (HYZAAR) 100-25 MG tablet Take 1 tablet by mouth daily. 90 tablet 3   magnesium oxide (MAG-OX) 400 (240 Mg) MG tablet Take 1 tablet (400 mg total) by mouth daily. 30 tablet 3   metoprolol tartrate (LOPRESSOR) 25 MG tablet Take 1 tablet (25 mg total) by mouth 2 (two) times daily. 180 tablet 3   montelukast (SINGULAIR) 10 MG tablet Take 10 mg by mouth at bedtime.      Multiple Vitamins-Minerals (MULTIVITAMIN WITH MINERALS) tablet Take 1 tablet by mouth daily.     nitroGLYCERIN (NITROSTAT) 0.4 MG SL tablet Place 1 tablet (0.4 mg total) under the tongue every 5 (five) minutes x 3 doses as needed (if no relief after 3rd dose, proceed to the ED for an evaluation or call 911). 25 tablet 3   NOVOLOG FLEXPEN 100 UNIT/ML FlexPen Inject 0-30 Units into the skin 3 (three) times daily as needed for high blood sugar (If blood glucose is above 200).     omeprazole (PRILOSEC) 40 MG capsule Take 1 capsule (40 mg total) by mouth 2 (two) times daily. 180 capsule 2   ondansetron (ZOFRAN) 4 MG tablet TAKE 1 TABLET BY MOUTH EVERY 8 HOURS AS NEEDED FOR NAUSEA AND VOMITING (Patient taking differently: Taking 1/2 at bedtime) 45 tablet 2   promethazine (PHENERGAN) 25 MG tablet Take 25 mg by mouth every 6 (six) hours as needed for nausea or vomiting.     traZODone  (DESYREL) 50 MG  tablet Take 150 mg by mouth at bedtime.     TRESIBA FLEXTOUCH 200 UNIT/ML FlexTouch Pen Inject 40 Units into the skin daily. As directed (Patient taking differently: Inject 45 Units into the skin 2 (two) times daily. As directed Between novolog)     metoCLOPramide (REGLAN) 5 MG tablet Take 1 tablet 30-45 minutes before evening and AM doses of bowel preparation solution. (Patient not taking: Reported on 01/08/2022) 2 tablet 0   No current facility-administered medications for this visit.    REVIEW OF SYSTEMS:   Constitutional: ( - ) fevers, ( - )  chills , ( + ) night sweats Eyes: ( - ) blurriness of vision, ( - ) double vision, ( - ) watery eyes Ears, nose, mouth, throat, and face: ( - ) mucositis, ( - ) sore throat Respiratory: ( - ) cough, ( - ) dyspnea, ( - ) wheezes Cardiovascular: ( - ) palpitation, ( - ) chest discomfort, ( - ) lower extremity swelling Gastrointestinal:  ( + ) nausea, ( - ) heartburn, ( + ) change in bowel habits Skin: ( - ) abnormal skin rashes Lymphatics: ( - ) new lymphadenopathy, ( - ) easy bruising Neurological: ( - ) numbness, ( - ) tingling, ( - ) new weaknesses Behavioral/Psych: ( - ) mood change, ( - ) new changes  All other systems were reviewed with the patient and are negative.  PHYSICAL EXAMINATION: ECOG PERFORMANCE STATUS: 0 - Asymptomatic  Vitals:   03/27/22 1056  BP: (!) 140/40  Pulse: 60  Resp: 19  Temp: 98.2 F (36.8 C)  SpO2: 93%   Filed Weights   03/27/22 1056  Weight: 161 lb 12.8 oz (73.4 kg)    GENERAL: alert, no distress and comfortable SKIN: skin color, texture, turgor are normal, no rashes or significant lesions EYES: conjunctiva are pink and non-injected, sclera clear NECK: supple, non-tender LYMPH:  no palpable lymphadenopathy in the cervical or inguinal LUNGS: clear to auscultation and percussion with normal breathing effort HEART: regular rate & rhythm and no murmurs and no lower extremity edema ABDOMEN:  soft, non-tender, non-distended, normal bowel sounds Musculoskeletal: no cyanosis of digits and no clubbing  PSYCH: alert & oriented x 3, fluent speech NEURO: no focal motor/sensory deficits  LABORATORY DATA:  I have reviewed the data as listed    Latest Ref Rng & Units 03/27/2022   10:15 AM 12/24/2021    3:14 PM 12/09/2021    2:31 PM  CBC  WBC 4.0 - 10.5 K/uL 11.3  13.8  10.9   Hemoglobin 12.0 - 15.0 g/dL 9.8  11.3  11.6   Hematocrit 36.0 - 46.0 % 29.2  33.7  34.3   Platelets 150 - 400 K/uL 222  248  202.0        Latest Ref Rng & Units 03/27/2022   10:15 AM 12/24/2021    3:14 PM 12/09/2021    2:31 PM  CMP  Glucose 70 - 99 mg/dL 254  231    BUN 8 - 23 mg/dL 10  11    Creatinine 0.44 - 1.00 mg/dL 0.71  0.84    Sodium 135 - 145 mmol/L 134  137    Potassium 3.5 - 5.1 mmol/L 3.8  4.2    Chloride 98 - 111 mmol/L 98  99    CO2 22 - 32 mmol/L 31  29    Calcium 8.9 - 10.3 mg/dL 9.7  9.5    Total Protein 6.5 - 8.1 g/dL 6.5  6.9  7.0   Total Bilirubin 0.3 - 1.2 mg/dL 0.4  0.6  0.4   Alkaline Phos 38 - 126 U/L 79  70  99   AST 15 - 41 U/L _0 ALT 0 - 44 U/L _1 RADIOGRAPHIC STUDIES: No results found.  ASSESSMENT & PLAN [name] 71 y.o. female with medical history significant for iron deficiency anemia due to GI blood loss and newly diagnosed CLL who presents for a follow up visit.  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 Due to GI intolerance, okay to discontinue --Labs today show worsening anemia with Hgb 9.8. Iron panel shows deficiency with iron saturation 9%, TIBC 468. Ferritin levels pending.  --Recommend IV venofer 200 mg once a week x 5 doses --Continue incorporate iron rich foods  into diet.  --RTC in 8 weeks with labs f/u visit.    #CLL Rai Stage 0: --There is evidence of mild lymphocytosis that has been present since August 2022.  --Today's labs show absolute lymphocyte count  of 6.3 -- flow cytometry shows the presence of CLL. --Patient does have occasional sweats but I do not believe this is related to her CLL.  Could consider that a B symptoms start treatment if persistent or worsening --Indications for CLL treatment noted below. --No clear indication to start treatment at this time.  Recommend observation.     Orders Placed This Encounter  Procedures   Vitamin B12    Standing Status:   Future    Number of Occurrences:   1    Standing Expiration Date:   03/27/2023   Folate, Serum    Standing Status:   Future    Number of Occurrences:   1    Standing Expiration Date:   03/27/2023    All questions were answered. The patient knows to call the clinic with any problems, questions or concerns.  A total of more than 30 minutes were spent on this encounter with face-to-face time and non-face-to-face time, including preparing to see the patient, ordering tests and/or medications, counseling the patient and coordination of care as outlined above.   Dede Query PA-C Dept of Hematology and [place] at [facility] Phone: [phone]    03/27/2022 11:53 AM  Kristian Covey BD, Norm Parcel, [place]Cappio F, Dighiero G, Dhner H, [place], [place], [place] E, Chiorazzi N, [place], Rai KR, [place], Eichhorst B, O'Brien S, Robak T, Seymour JF, [place] TJ. iwCLL guidelines for diagnosis, indications for treatment, response assessment, and supportive management of CLL. Blood. 2018 Jun 21;131(25):2745-2760.  Active disease should be clearly documented to initiate therapy. At least 1 of the following criteria should be met.  1) Evidence of progressive marrow failure as manifested by the development of, or worsening of, anemia and/or thrombocytopenia. Cutoff levels of Hb <10 g/dL or platelet counts <100  109/L are generally regarded as indication for treatment. However, in some patients, platelet counts <100  109/L may remain  stable over a long period; this situation does not automatically require therapeutic intervention. 2) Massive (ie, ?6 cm below the left costal margin) or progressive or symptomatic splenomegaly. 3) Massive nodes (ie, ?10 cm in longest diameter) or progressive or symptomatic lymphadenopathy. 4) Progressive lymphocytosis with an increase of ?50% over a 4-monthperiod, or lymphocyte doubling time (LDT) <6 months. LDT can  be obtained by linear regression extrapolation of absolute lymphocyte counts obtained at intervals of 2 weeks over an observation period of 2 to 3 months; patients with initial blood lymphocyte counts <30  109/L may require a longer observation period to determine the LDT. Factors contributing to lymphocytosis other than CLL (eg, infections, steroid administration) should be excluded. 5) Autoimmune complications including anemia or thrombocytopenia poorly responsive to corticosteroids. 6) Symptomatic or functional extranodal involvement (eg, skin, kidney, lung, spine). Disease-related symptoms as defined by any of the following: Unintentional weight loss ?10% within the previous 6 months. Significant fatigue (ie, ECOG performance scale 2 or worse; cannot work or unable to perform usual activities). Fevers ?100.16F or 38.0C for 2 or more weeks without evidence of infection. Night sweats for ?1 month without evidence of infection.

## 2022-03-31 ENCOUNTER — Telehealth: Payer: Self-pay | Admitting: Gastroenterology

## 2022-03-31 NOTE — Telephone Encounter (Signed)
Spoke to the patient and clarified we have her on Bentyl 10 mg TID. She will check with her pharmacy who initiated the '20mg'$  dose. Also scheduled her for a 6 month cirrhosis follow up for 05-2022

## 2022-03-31 NOTE — Telephone Encounter (Signed)
Inbound call from patient stating her dicyclomine she picked up from her pharmacy was 20 mg, she states she has only been taking 10 mg. Please advise.

## 2022-04-01 ENCOUNTER — Other Ambulatory Visit: Payer: Self-pay | Admitting: Cardiology

## 2022-04-01 MED ORDER — ISOSORBIDE MONONITRATE ER 60 MG PO TB24: 60.0000 mg | ORAL_TABLET | Freq: Every day | ORAL | 3 refills | Status: DC

## 2022-04-02 ENCOUNTER — Other Ambulatory Visit: Payer: Self-pay | Admitting: *Deleted

## 2022-04-02 MED ORDER — ATORVASTATIN CALCIUM 40 MG PO TABS: 40.0000 mg | ORAL_TABLET | Freq: Every day | ORAL | 3 refills | Status: DC

## 2022-04-03 ENCOUNTER — Other Ambulatory Visit: Payer: Self-pay

## 2022-04-03 ENCOUNTER — Inpatient Hospital Stay: Payer: Medicare HMO

## 2022-04-03 VITALS — BP 112/41 | HR 50 | Temp 98.1°F | Resp 18

## 2022-04-03 DIAGNOSIS — D500 Iron deficiency anemia secondary to blood loss (chronic): Secondary | ICD-10-CM | POA: Diagnosis not present

## 2022-04-03 MED ORDER — SODIUM CHLORIDE 0.9 % IV SOLN
200.0000 mg | Freq: Once | INTRAVENOUS | Status: AC
  Administered 2022-04-03: 200 mg via INTRAVENOUS
  Filled 2022-04-03: qty 200

## 2022-04-03 MED ORDER — SODIUM CHLORIDE 0.9 % IV SOLN: Freq: Once | INTRAVENOUS | Status: AC

## 2022-04-03 NOTE — Patient Instructions (Signed)

## 2022-04-09 ENCOUNTER — Inpatient Hospital Stay: Payer: Medicare HMO

## 2022-04-09 ENCOUNTER — Other Ambulatory Visit: Payer: Self-pay

## 2022-04-09 VITALS — BP 150/54 | HR 58 | Resp 16

## 2022-04-09 DIAGNOSIS — D500 Iron deficiency anemia secondary to blood loss (chronic): Secondary | ICD-10-CM

## 2022-04-09 MED ORDER — SODIUM CHLORIDE 0.9 % IV SOLN: Freq: Once | INTRAVENOUS | Status: AC

## 2022-04-09 MED ORDER — SODIUM CHLORIDE 0.9 % IV SOLN
200.0000 mg | Freq: Once | INTRAVENOUS | Status: AC
  Administered 2022-04-09: 200 mg via INTRAVENOUS
  Filled 2022-04-09: qty 200

## 2022-04-09 NOTE — Patient Instructions (Signed)

## 2022-04-10 ENCOUNTER — Ambulatory Visit: Payer: Medicare HMO

## 2022-04-17 ENCOUNTER — Inpatient Hospital Stay: Payer: Medicare HMO

## 2022-04-17 ENCOUNTER — Other Ambulatory Visit: Payer: Self-pay | Admitting: Cardiology

## 2022-04-17 ENCOUNTER — Other Ambulatory Visit: Payer: Self-pay

## 2022-04-17 VITALS — BP 135/55 | HR 61 | Temp 98.5°F | Resp 16

## 2022-04-17 DIAGNOSIS — D500 Iron deficiency anemia secondary to blood loss (chronic): Secondary | ICD-10-CM | POA: Diagnosis not present

## 2022-04-17 MED ORDER — SODIUM CHLORIDE 0.9 % IV SOLN
200.0000 mg | Freq: Once | INTRAVENOUS | Status: AC
  Administered 2022-04-17: 200 mg via INTRAVENOUS
  Filled 2022-04-17: qty 200

## 2022-04-17 MED ORDER — SODIUM CHLORIDE 0.9 % IV SOLN: Freq: Once | INTRAVENOUS | Status: AC

## 2022-04-17 NOTE — Patient Instructions (Signed)

## 2022-04-23 ENCOUNTER — Other Ambulatory Visit: Payer: Self-pay | Admitting: Gastroenterology

## 2022-04-24 ENCOUNTER — Inpatient Hospital Stay: Payer: Medicare HMO | Attending: Physician Assistant

## 2022-04-24 VITALS — BP 126/55 | HR 57 | Temp 98.7°F | Resp 17

## 2022-04-24 DIAGNOSIS — C9110 Chronic lymphocytic leukemia of B-cell type not having achieved remission: Secondary | ICD-10-CM | POA: Insufficient documentation

## 2022-04-24 DIAGNOSIS — Z87891 Personal history of nicotine dependence: Secondary | ICD-10-CM | POA: Diagnosis not present

## 2022-04-24 DIAGNOSIS — D500 Iron deficiency anemia secondary to blood loss (chronic): Secondary | ICD-10-CM | POA: Diagnosis present

## 2022-04-24 DIAGNOSIS — Z800 Family history of malignant neoplasm of digestive organs: Secondary | ICD-10-CM | POA: Diagnosis not present

## 2022-04-24 DIAGNOSIS — K922 Gastrointestinal hemorrhage, unspecified: Secondary | ICD-10-CM | POA: Diagnosis not present

## 2022-04-24 DIAGNOSIS — R110 Nausea: Secondary | ICD-10-CM | POA: Diagnosis not present

## 2022-04-24 MED ORDER — DIPHENHYDRAMINE HCL 25 MG PO CAPS
25.0000 mg | ORAL_CAPSULE | Freq: Once | ORAL | Status: AC
  Administered 2022-04-24: 25 mg via ORAL
  Filled 2022-04-24: qty 1

## 2022-04-24 MED ORDER — SODIUM CHLORIDE 0.9 % IV SOLN: Freq: Once | INTRAVENOUS | Status: AC

## 2022-04-24 MED ORDER — SODIUM CHLORIDE 0.9 % IV SOLN
200.0000 mg | Freq: Once | INTRAVENOUS | Status: AC
  Administered 2022-04-24: 200 mg via INTRAVENOUS
  Filled 2022-04-24: qty 200

## 2022-04-24 NOTE — Patient Instructions (Signed)

## 2022-04-29 ENCOUNTER — Other Ambulatory Visit: Payer: Self-pay | Admitting: Physician Assistant

## 2022-05-01 ENCOUNTER — Telehealth: Payer: Self-pay | Admitting: *Deleted

## 2022-05-01 ENCOUNTER — Inpatient Hospital Stay: Payer: Medicare HMO

## 2022-05-01 ENCOUNTER — Telehealth: Payer: Self-pay

## 2022-05-01 VITALS — BP 110/44 | HR 65 | Temp 98.7°F | Resp 20 | Ht 62.0 in | Wt 161.8 lb

## 2022-05-01 DIAGNOSIS — D500 Iron deficiency anemia secondary to blood loss (chronic): Secondary | ICD-10-CM

## 2022-05-01 MED ORDER — DIPHENHYDRAMINE HCL 25 MG PO CAPS
25.0000 mg | ORAL_CAPSULE | Freq: Once | ORAL | Status: AC
  Administered 2022-05-01: 25 mg via ORAL
  Filled 2022-05-01: qty 1

## 2022-05-01 MED ORDER — SODIUM CHLORIDE 0.9 % IV SOLN
200.0000 mg | Freq: Once | INTRAVENOUS | Status: AC
  Administered 2022-05-01: 200 mg via INTRAVENOUS
  Filled 2022-05-01: qty 200

## 2022-05-01 MED ORDER — ACETAMINOPHEN 325 MG PO TABS
650.0000 mg | ORAL_TABLET | Freq: Once | ORAL | Status: AC
  Administered 2022-05-01: 650 mg via ORAL
  Filled 2022-05-01: qty 2

## 2022-05-01 MED ORDER — SODIUM CHLORIDE 0.9 % IV SOLN: Freq: Once | INTRAVENOUS | Status: AC

## 2022-05-01 NOTE — Patient Instructions (Signed)

## 2022-05-01 NOTE — Telephone Encounter (Signed)
Can patient have RSV and Pneumonia vaccine.  Per Murray Hodgkins it is okay to get both. advise to not get on same day so she has time to recover if she has any flu like symptoms. Patient gave verbal understanding and had no further questions or concerns at this time.

## 2022-05-01 NOTE — Telephone Encounter (Signed)
Called pt. and left message. Per Dede Query, PA, ok for pt. to get RSV and Pneumonia vaccine, but advised not to get at the same time as may cause flu like symptoms.

## 2022-05-15 ENCOUNTER — Encounter: Payer: Self-pay | Admitting: Cardiology

## 2022-05-15 ENCOUNTER — Ambulatory Visit: Payer: Medicare HMO | Attending: Cardiology | Admitting: Cardiology

## 2022-05-15 VITALS — BP 132/74 | HR 58 | Temp 94.0°F | Ht 62.0 in | Wt 163.0 lb

## 2022-05-15 DIAGNOSIS — E782 Mixed hyperlipidemia: Secondary | ICD-10-CM | POA: Diagnosis not present

## 2022-05-15 DIAGNOSIS — I25119 Atherosclerotic heart disease of native coronary artery with unspecified angina pectoris: Secondary | ICD-10-CM

## 2022-05-15 DIAGNOSIS — D500 Iron deficiency anemia secondary to blood loss (chronic): Secondary | ICD-10-CM

## 2022-05-15 NOTE — Patient Instructions (Signed)

## 2022-05-15 NOTE — Progress Notes (Signed)
Cardiology Office Note  Date: 05/15/2022   ID: [name], DOB [birth year], MRN [id number]  PCP:  Caryl Bis, MD  Cardiologist:  Rozann Lesches, MD Electrophysiologist:  None   Chief Complaint  Patient presents with   Cardiac follow-up    History of Present Illness: [name] is a 71 y.o. female last seen in July by Ms. Strader PA-C, I reviewed the note.  She is here today with her husband for routine visit.  Reports improvement in angina symptoms following up titration of Imdur at last visit.  She has a number of other comorbidities as noted below including CLL and iron deficiency anemia with chronic GI blood loss, receiving iron infusions and follow-up by oncology as well as gastroenterology.  She has pending visits with both specialties in November.  Last hemoglobin was 9.8 in September.  We went over her cardiac medications.  I did talk with her about the possibility of stopping aspirin at this point in light of her recurring anemia, since has not undergone any interval PCI since DES to the LAD in 2014, patent at angiography in October 2022.  Even if cutting back to every other day would be beneficial in terms of her anemia this would be reasonable at this point.  Remainder of her cardiac regimen has been stable.  Lipids are being followed by Dr. Quillian Quince.  She has tolerated Lipitor.  Past Medical History:  Diagnosis Date   Anxiety    Arthritis    Asthma    Barrett esophagus    Chronic bronchitis (HCC)    Chronic diarrhea    Chronic lower back pain    CLL (chronic lymphocytic leukemia) ([place]) 01/11/2022   Coronary atherosclerosis of native coronary artery    a. s/p DES to LAD in 2014 b. cath in 12/2017 showing patent LAD stent with mild to moderate RCA and LCx disease c. cath in 04/2021 showing patent LAD stent with 60-70% RCA stenosis but not significant by FFR   Diverticulosis    Essential hypertension    Fatty liver    Fibromyalgia    GERD (gastroesophageal  reflux disease)    H/O hiatal hernia    IBS (irritable bowel syndrome)    Major depression in partial remission ([place])    Migraines    Mixed hyperlipidemia    Myocardial infarction ([place])    2014   OSA on CPAP    Osteoporosis    Pancreatitis 2008   Pneumonia 1990's   Schatzki's ring    Sleep apnea    Type 2 diabetes mellitus ([place])     Past Surgical History:  Procedure Laterality Date   29 HOUR [place] STUDY N/A 02/16/2018   Procedure: 24 HOUR PH STUDY;  Surgeon: Mauri Pole, MD;  Location: [facility];  Service: Endoscopy;  Laterality: N/A;   ABDOMINAL HYSTERECTOMY  ?1987   APPENDECTOMY  ?1987   BIOPSY  03/02/2021   Procedure: BIOPSY;  Surgeon: Lavena Bullion, DO;  Location: [facility];  Service: Gastroenterology;;   BUNIONECTOMY Bilateral [place] Bilateral ?1990   CATARACT EXTRACTION W/ INTRAOCULAR LENS  IMPLANT, BILATERAL Bilateral ?2005   [place]  1990'S   COLONOSCOPY  10/13   Dr. Britta Mccreedy - tubular adenomas and diverticulosis   COLONOSCOPY WITH PROPOFOL N/A 08/28/2021   Procedure: COLONOSCOPY WITH PROPOFOL;  Surgeon: Irving Copas., MD;  Location: [place];  Service: Gastroenterology;  Laterality: N/A;  ultra slim  scope   DILATION AND CURETTAGE OF UTERUS  1970's   "probably 3" (12/09/2012)   ERCP  2008   ESOPHAGEAL DILATION  ` 2012   ESOPHAGEAL MANOMETRY N/A 10/21/2015   Procedure: ESOPHAGEAL MANOMETRY (EM);  Surgeon: Doran Stabler, MD;  Location: [facility];  Service: Gastroenterology;  Laterality: N/A;   ESOPHAGEAL MANOMETRY N/A 02/16/2018   Procedure: ESOPHAGEAL MANOMETRY (EM);  Surgeon: Mauri Pole, MD;  Location: [facility];  Service: Endoscopy;  Laterality: N/A;   ESOPHAGOGASTRODUODENOSCOPY Left 03/02/2021   Procedure: ESOPHAGOGASTRODUODENOSCOPY (EGD);  Surgeon: Lavena Bullion, DO;  Location: [facility] ENDOSCOPY;  Service: Gastroenterology;  Laterality: Left;   HUMERUS FRACTURE SURGERY  Right 07/19/1969   "horst threw me" (12/09/2012)   INTRAVASCULAR PRESSURE WIRE/FFR STUDY N/A 05/12/2021   Procedure: INTRAVASCULAR PRESSURE WIRE/FFR STUDY;  Surgeon: Nelva Bush, MD;  Location: [place] [facility];  Service: Cardiovascular;  Laterality: N/A;   LEFT HEART CATH AND CORONARY ANGIOGRAPHY N/A 12/29/2017   Procedure: LEFT HEART CATH AND CORONARY ANGIOGRAPHY;  Surgeon: Wellington Hampshire, MD;  Location: [place] [facility];  Service: Cardiovascular;  Laterality: N/A;   LEFT HEART CATH AND CORONARY ANGIOGRAPHY N/A 05/12/2021   Procedure: LEFT HEART CATH AND CORONARY ANGIOGRAPHY;  Surgeon: Nelva Bush, MD;  Location: [place] [facility];  Service: Cardiovascular;  Laterality: N/A;   NASAL SEPTUM SURGERY  1970's   PERCUTANEOUS CORONARY STENT INTERVENTION (PCI-S) N/A 12/09/2012   Procedure: PERCUTANEOUS CORONARY STENT INTERVENTION (PCI-S);  Surgeon: Burnell Blanks, MD;  Location: [facility] C[facility] LAB;  Service: Cardiovascular;  Laterality: N/A;   [place] IMPEDANCE STUDY N/A 02/16/2018   Procedure: [place] IMPEDANCE STUDY;  Surgeon: Mauri Pole, MD;  Location: [facility];  Service: Endoscopy;  Laterality: N/A;   TOE FUSION Right 10/11   First MTP joint   TOE SURGERY Right 1990's   "toe next to big toe:  dr said I had tumor; cut bone & stuff; another OR dr stretched tendons, etc" (12/09/2012)   TUBAL LIGATION  1985    Current Outpatient Medications  Medication Sig Dispense Refill   albuterol (PROVENTIL HFA;VENTOLIN HFA) 108 (90 Base) MCG/ACT inhaler Inhale 2 puffs into the lungs every 4 (four) hours as needed for shortness of breath (Asthma).  3   ALPRAZolam (XANAX) 1 MG tablet Take 1 mg by mouth at bedtime.  1   aspirin EC 81 MG tablet Take 81 mg by mouth daily.     atorvastatin (LIPITOR) 40 MG tablet Take 1 tablet (40 mg total) by mouth daily. 90 tablet 3   BIOTIN PO Take 1 tablet by mouth daily.     cetirizine (ZYRTEC) 10 MG tablet Take 10 mg by mouth daily as needed for allergies.      Cholecalciferol (VITAMIN D-3) 125 MCG (5000 UT) TABS Take 10,000 Units by mouth daily.     dicyclomine (BENTYL) 10 MG capsule TAKE 1 CAPSULE (10 MG TOTAL) BY MOUTH 3 (THREE) TIMES DAILY BEFORE MEALS. 270 capsule 1   diltiazem (CARDIZEM CD) 120 MG 24 hr capsule TAKE 1 CAPSULE (120 MG TOTAL) BY MOUTH EVERY MORNING 90 capsule 1   DULoxetine (CYMBALTA) 60 MG capsule Take 60 mg by mouth daily.     HYDROcodone-acetaminophen (NORCO/VICODIN) 5-325 MG tablet Take 1 tablet by mouth 2 (two) times daily.  0   isosorbide mononitrate (IMDUR) 60 MG 24 hr tablet Take 1 tablet (60 mg total) by mouth daily. 90 tablet 3   losartan-hydrochlorothiazide (HYZAAR) 100-25 MG tablet Take 1 tablet by  mouth daily. 90 tablet 3   magnesium oxide (MAG-OX) 400 (240 Mg) MG tablet TAKE 1 TABLET BY MOUTH EVERY DAY 90 tablet 1   metoprolol tartrate (LOPRESSOR) 25 MG tablet TAKE 1 TABLET BY MOUTH TWICE A DAY 180 tablet 3   montelukast (SINGULAIR) 10 MG tablet Take 10 mg by mouth at bedtime.      Multiple Vitamins-Minerals (MULTIVITAMIN WITH MINERALS) tablet Take 1 tablet by mouth daily.     nitroGLYCERIN (NITROSTAT) 0.4 MG SL tablet Place 1 tablet (0.4 mg total) under the tongue every 5 (five) minutes x 3 doses as needed (if no relief after 3rd dose, proceed to the ED for an evaluation or call 911). 25 tablet 3   NOVOLOG FLEXPEN 100 UNIT/ML FlexPen Inject 0-30 Units into the skin 3 (three) times daily as needed for high blood sugar (If blood glucose is above 200).     omeprazole (PRILOSEC) 40 MG capsule TAKE 1 CAPSULE BY MOUTH 2 TIMES A DAY 180 capsule 2   ondansetron (ZOFRAN) 4 MG tablet TAKE 1 TABLET BY MOUTH EVERY 8 HOURS AS NEEDED FOR NAUSEA AND VOMITING (Patient taking differently: Taking 1/2 at bedtime) 45 tablet 2   promethazine (PHENERGAN) 25 MG tablet Take 25 mg by mouth every 6 (six) hours as needed for nausea or vomiting.     traZODone (DESYREL) 50 MG tablet Take 150 mg by mouth at bedtime.     TRESIBA FLEXTOUCH 200  UNIT/ML FlexTouch Pen Inject 40 Units into the skin daily. As directed (Patient taking differently: Inject 45 Units into the skin 2 (two) times daily. As directed Between novolog)     No current facility-administered medications for this visit.   Allergies:  Erythromycin base, Azithromycin, Pollen extract, Remeron [mirtazapine], Zolpidem tartrate, Other, Sulfonamide derivatives, and Tree extract   ROS: No orthopnea or PND.  Physical Exam: VS:  BP 132/74   Pulse (!) 58   Temp (!) 94 F (34.4 C)   Ht '5\' 2"'$  (1.575 m)   Wt 163 lb (73.9 kg)   BMI 29.81 kg/m , BMI Body mass index is 29.81 kg/m.  Wt Readings from Last 3 Encounters:  05/15/22 163 lb (73.9 kg)  05/01/22 161 lb 12.8 oz (73.4 kg)  03/27/22 161 lb 12.8 oz (73.4 kg)    General: Patient appears comfortable at rest. HEENT: Conjunctiva and lids normal. Neck: Supple, no elevated JVP or carotid bruits. Lungs: Clear to auscultation, nonlabored breathing at rest. Cardiac: Regular rate and rhythm, no S3, 1/6 systolic murmur, no pericardial rub. Extremities: No pitting edema, distal pulses 2+.  ECG:  An ECG dated 02/04/2022 was personally reviewed today and demonstrated:  Sinus rhythm.  Recent Labwork: 01/30/2022: Magnesium 1.6 03/27/2022: ALT 16; AST 21; BUN 10; Creatinine 0.71; Hemoglobin 9.8; Platelet Count 222; Potassium 3.8; Sodium 134     Component Value Date/Time   CHOL 160 12/29/2017 0258   TRIG 191 (H) 12/29/2017 0258   HDL 40 (L) 12/29/2017 0258   CHOLHDL 4.0 12/29/2017 0258   VLDL 38 12/29/2017 0258   LDLCALC 82 12/29/2017 0258    Other Studies Reviewed Today:  Echocardiogram 03/04/2021:  1. Left ventricular ejection fraction, by estimation, is 60 to 65%. The  left ventricle has normal function. The left ventricle has no regional  wall motion abnormalities. There is mild left ventricular hypertrophy.  Left ventricular diastolic parameters  are consistent with Grade II diastolic dysfunction (pseudonormalization).   Elevated left ventricular end-diastolic pressure.   2. Right ventricular systolic function is  normal. The right ventricular  size is normal.   3. The mitral valve is abnormal. Mild mitral valve regurgitation. No  evidence of mitral stenosis.   4. The aortic valve is tricuspid. There is moderate calcification of the  aortic valve. There is moderate thickening of the aortic valve. Aortic  valve regurgitation is not visualized. Mild aortic valve stenosis.   5. The inferior vena cava is normal in size with greater than 50%  respiratory variability, suggesting right atrial pressure of 3 mmHg.   Cardiac catheterization 05/12/2021: Conclusions: Mild-moderate, non-obstructive coronary artery disease.  The most severe stenosis is a 60-70% lesion in the mid RCA that is not hemodynamically significant (RFR = 0.94), though it has worsened since 2019.  There is no severe disease in the LAD territory to explain perfusion defect on recent stress test. Widely patent mid LAD stent. Normal left ventricular contraction (LVEF 55-65%) with mildly elevated filling pressure (LVEDP 20-25 mmHg).   Recommendations: Continue medical therapy for secondary prevention of CAD and antianginal therapy.  Assessment and Plan:  1.  CAD status post DES to the LAD in 2014.  Recent cardiac catheterization in October 2022 revealed patent stent site with a moderate mid RCA stenosis that was nonhemodynamically significant by RFR assessment and plan to continue medical therapy.  She reports improvement in angina since last visit.  Currently on aspirin, Lipitor, Cardizem CD, Lopressor, Hyzaar, Imdur, and as needed nitroglycerin.  2.  Recurrent iron deficiency anemia in the setting of CLL and chronic GI blood loss.  We did talk about possibility of stopping aspirin or at least cutting back to every other day if oncology and gastroenterology felt that this would be beneficial in terms of her degree of anemia.  She has not undergone any  stent intervention since 2014.  3.  Hyperlipidemia, continues on Lipitor with follow-up by Dr. Quillian Quince.  Medication Adjustments/Labs and Tests Ordered: Current medicines are reviewed at length with the patient today.  Concerns regarding medicines are outlined above.   Tests Ordered: No orders of the defined types were placed in this encounter.   Medication Changes: No orders of the defined types were placed in this encounter.   Disposition:  Follow up  6 months.  Signed, [name], MD, [facility] 05/15/2022 11:50 AM    [facility] [place], [place], [place] [address] Phone: [phone]; Fax: [phone]

## 2022-05-22 ENCOUNTER — Encounter: Payer: Self-pay | Admitting: *Deleted

## 2022-05-22 ENCOUNTER — Inpatient Hospital Stay: Payer: Medicare HMO | Attending: Physician Assistant

## 2022-05-22 ENCOUNTER — Inpatient Hospital Stay: Payer: Medicare HMO | Admitting: Hematology and Oncology

## 2022-05-22 ENCOUNTER — Other Ambulatory Visit: Payer: Self-pay | Admitting: Hematology and Oncology

## 2022-05-22 VITALS — BP 134/54 | HR 61 | Temp 98.1°F | Resp 14 | Wt 163.0 lb

## 2022-05-22 DIAGNOSIS — K922 Gastrointestinal hemorrhage, unspecified: Secondary | ICD-10-CM | POA: Insufficient documentation

## 2022-05-22 DIAGNOSIS — D500 Iron deficiency anemia secondary to blood loss (chronic): Secondary | ICD-10-CM

## 2022-05-22 DIAGNOSIS — Z800 Family history of malignant neoplasm of digestive organs: Secondary | ICD-10-CM | POA: Diagnosis not present

## 2022-05-22 DIAGNOSIS — R110 Nausea: Secondary | ICD-10-CM | POA: Diagnosis not present

## 2022-05-22 DIAGNOSIS — D72820 Lymphocytosis (symptomatic): Secondary | ICD-10-CM | POA: Diagnosis not present

## 2022-05-22 DIAGNOSIS — C9110 Chronic lymphocytic leukemia of B-cell type not having achieved remission: Secondary | ICD-10-CM

## 2022-05-22 DIAGNOSIS — Z87891 Personal history of nicotine dependence: Secondary | ICD-10-CM | POA: Insufficient documentation

## 2022-05-22 DIAGNOSIS — D649 Anemia, unspecified: Secondary | ICD-10-CM | POA: Diagnosis not present

## 2022-05-22 LAB — CBC WITH DIFFERENTIAL (CANCER CENTER ONLY)
Abs Immature Granulocytes: 0.02 10*3/uL (ref 0.00–0.07)
Basophils Absolute: 0.1 10*3/uL (ref 0.0–0.1)
Basophils Relative: 1 %
Eosinophils Absolute: 0.3 10*3/uL (ref 0.0–0.5)
Eosinophils Relative: 3 %
HCT: 33.3 % — ABNORMAL LOW (ref 36.0–46.0)
Hemoglobin: 11.1 g/dL — ABNORMAL LOW (ref 12.0–15.0)
Immature Granulocytes: 0 %
Lymphocytes Relative: 52 %
Lymphs Abs: 6 10*3/uL — ABNORMAL HIGH (ref 0.7–4.0)
MCH: 32.1 pg (ref 26.0–34.0)
MCHC: 33.3 g/dL (ref 30.0–36.0)
MCV: 96.2 fL (ref 80.0–100.0)
Monocytes Absolute: 1.2 10*3/uL — ABNORMAL HIGH (ref 0.1–1.0)
Monocytes Relative: 11 %
Neutro Abs: 3.7 10*3/uL (ref 1.7–7.7)
Neutrophils Relative %: 33 %
Platelet Count: 203 10*3/uL (ref 150–400)
RBC: 3.46 MIL/uL — ABNORMAL LOW (ref 3.87–5.11)
RDW: 14.5 % (ref 11.5–15.5)
WBC Count: 11.3 10*3/uL — ABNORMAL HIGH (ref 4.0–10.5)
nRBC: 0 % (ref 0.0–0.2)

## 2022-05-22 LAB — CMP (CANCER CENTER ONLY)
ALT: 21 U/L (ref 0–44)
AST: 23 U/L (ref 15–41)
Albumin: 3.9 g/dL (ref 3.5–5.0)
Alkaline Phosphatase: 83 U/L (ref 38–126)
Anion gap: 3 — ABNORMAL LOW (ref 5–15)
BUN: 11 mg/dL (ref 8–23)
CO2: 34 mmol/L — ABNORMAL HIGH (ref 22–32)
Calcium: 9.7 mg/dL (ref 8.9–10.3)
Chloride: 101 mmol/L (ref 98–111)
Creatinine: 0.72 mg/dL (ref 0.44–1.00)
GFR, Estimated: >60 mL/min (ref >60)
Glucose, Bld: 182 mg/dL — ABNORMAL HIGH (ref 70–99)
Potassium: 3.8 mmol/L (ref 3.5–5.1)
Sodium: 138 mmol/L (ref 135–145)
Total Bilirubin: 0.4 mg/dL (ref 0.3–1.2)
Total Protein: 6.5 g/dL (ref 6.5–8.1)

## 2022-05-22 LAB — IRON AND IRON BINDING CAPACITY (CC-WL,HP ONLY)
Iron: 54 ug/dL (ref 28–170)
Saturation Ratios: 12 % (ref 10.4–31.8)
TIBC: 440 ug/dL (ref 250–450)
UIBC: 386 ug/dL (ref 148–442)

## 2022-05-22 LAB — RETIC PANEL
Immature Retic Fract: 17.7 % — ABNORMAL HIGH (ref 2.3–15.9)
RBC.: 3.44 MIL/uL — ABNORMAL LOW (ref 3.87–5.11)
Retic Count, Absolute: 79.5 10*3/uL (ref 19.0–186.0)
Retic Ct Pct: 2.3 % (ref 0.4–3.1)
Reticulocyte Hemoglobin: 32.3 pg (ref >27.9)

## 2022-05-22 LAB — LACTATE DEHYDROGENASE: LDH: 118 U/L (ref 98–192)

## 2022-05-22 LAB — FERRITIN: Ferritin: 30 ng/mL (ref 11–307)

## 2022-05-22 NOTE — Progress Notes (Unsigned)
[place] [phone]   [phone]  PROGRESS NOTE  Patient Care Team: Caryl Bis, MD as PCP - General (Family Medicine) [name], MD as PCP - Cardiology (Cardiology) [name], MD as Consulting Physician (Cardiology) Deneise Lever, MD as Consulting Physician (Pulmonary Disease) Dr. 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 Lorenso Courier and Dede Query 12/24/2021: flow cytometry confirms lymphocytosis is CLL.   Interval History:  [name] 71 y.o. female with medical history significant for iron deficiency anemia due to GI blood loss and CLL who presents for a follow up visit. The patient's last visit was on 03/27/2022 by Dr. Lorenso Courier. In the interim since the last visit, she completed her IV iron infusions.   On exam today [name] reports he did have some trouble tolerating her last round of IV iron infusions and the last 2 she got "hot in the face".  She reports that her symptoms were "not ordinary" and she became quite sweaty.  She also notes that she does periodically "fall asleep when she does not want to".  She notes that she would sit down and sometimes fall into a nap.  She notes that she does often feel exhausted and does have trouble with headaches.  She notes that she could "nap right now".  Unfortunately she is unable to complete most tasks without sitting down to take a nap.  She notes that she has been doing her best to try to eat chicken livers and liver pudding.  She notes that she does still occasionally have some blood in her stools consistent with her prior hemorrhoidal bleeding.  She does follow with Dr. [name] for this.  She denies fevers, chills, shortness of breath,  chest pain or cough.  She has no other complaints.  A full 10 point ROS is listed below.  MEDICAL HISTORY:  Past Medical History:  Diagnosis Date   Anxiety    Arthritis    Asthma    Barrett esophagus    Chronic bronchitis (HCC)    Chronic diarrhea    Chronic lower back pain    CLL (chronic lymphocytic leukemia) ([place]) 01/11/2022   Coronary atherosclerosis of native coronary artery    a. s/p DES to LAD in 2014 b. cath in 12/2017 showing patent LAD stent with mild to moderate RCA and LCx disease c. cath in 04/2021 showing patent LAD stent with 60-70% RCA stenosis but not significant by FFR   Diverticulosis    Essential hypertension    Fatty liver    Fibromyalgia    GERD (gastroesophageal reflux disease)    H/O hiatal hernia    IBS (irritable bowel syndrome)    Major depression in partial remission ([place])    Migraines    Mixed hyperlipidemia    Myocardial infarction ([place])    2014   OSA on CPAP    Osteoporosis    Pancreatitis 2008   Pneumonia 1990's   Schatzki's ring    Sleep apnea    Type 2 diabetes mellitus ([place])     SURGICAL HISTORY: Past Surgical History:  Procedure Laterality Date   15 HOUR [place] STUDY N/A 02/16/2018   Procedure: 24 HOUR PH STUDY;  Surgeon: Mauri Pole, MD;  Location: [facility];  Service: Endoscopy;  Laterality: N/A;   ABDOMINAL HYSTERECTOMY  ?1987   APPENDECTOMY  ?1987   BIOPSY  03/02/2021   Procedure: BIOPSY;  Surgeon: Lavena Bullion, DO;  Location: [facility];  Service: Gastroenterology;;   BUNIONECTOMY Bilateral [place] Bilateral ?1990   CATARACT EXTRACTION W/ INTRAOCULAR LENS  IMPLANT, BILATERAL Bilateral ?2005   [place]  1990'S   COLONOSCOPY  10/13   Dr. Britta Mccreedy - tubular adenomas and diverticulosis   COLONOSCOPY WITH PROPOFOL N/A 08/28/2021   Procedure: COLONOSCOPY WITH PROPOFOL;  Surgeon: Irving Copas., MD;  Location: [facility];  Service: Gastroenterology;   Laterality: N/A;  ultra slim scope   DILATION AND CURETTAGE OF UTERUS  1970's   "probably 3" (12/09/2012)   ERCP  2008   ESOPHAGEAL DILATION  ` 2012   ESOPHAGEAL MANOMETRY N/A 10/21/2015   Procedure: ESOPHAGEAL MANOMETRY (EM);  Surgeon: Doran Stabler, MD;  Location: [facility];  Service: Gastroenterology;  Laterality: N/A;   ESOPHAGEAL MANOMETRY N/A 02/16/2018   Procedure: ESOPHAGEAL MANOMETRY (EM);  Surgeon: Mauri Pole, MD;  Location: [facility];  Service: Endoscopy;  Laterality: N/A;   ESOPHAGOGASTRODUODENOSCOPY Left 03/02/2021   Procedure: ESOPHAGOGASTRODUODENOSCOPY (EGD);  Surgeon: Lavena Bullion, DO;  Location: [facility] ENDOSCOPY;  Service: Gastroenterology;  Laterality: Left;   HUMERUS FRACTURE SURGERY Right 07/19/1969   "horst threw me" (12/09/2012)   INTRAVASCULAR PRESSURE WIRE/FFR STUDY N/A 05/12/2021   Procedure: INTRAVASCULAR PRESSURE WIRE/FFR STUDY;  Surgeon: Nelva Bush, MD;  Location: [facility];  Service: Cardiovascular;  Laterality: N/A;   LEFT HEART CATH AND CORONARY ANGIOGRAPHY N/A 12/29/2017   Procedure: LEFT HEART CATH AND CORONARY ANGIOGRAPHY;  Surgeon: Wellington Hampshire, MD;  Location: [facility] CV LAB;  Service: Cardiovascular;  Laterality: N/A;   LEFT HEART CATH AND CORONARY ANGIOGRAPHY N/A 05/12/2021   Procedure: LEFT HEART CATH AND CORONARY ANGIOGRAPHY;  Surgeon: Nelva Bush, MD;  Location: [facility] LAB;  Service: Cardiovascular;  Laterality: N/A;   NASAL SEPTUM SURGERY  1970's   PERCUTANEOUS CORONARY STENT INTERVENTION (PCI-S) N/A 12/09/2012   Procedure: PERCUTANEOUS CORONARY STENT INTERVENTION (PCI-S);  Surgeon: Burnell Blanks, MD;  Location: [facility] CATH LAB;  Service: Cardiovascular;  Laterality: N/A;   [place] IMPEDANCE STUDY N/A 02/16/2018   Procedure: [place] IMPEDANCE STUDY;  Surgeon: Mauri Pole, MD;  Location: [facility];  Service: Endoscopy;  Laterality: N/A;   TOE FUSION Right 10/11   First MTP joint   TOE SURGERY  Right 1990's   "toe next to big toe:  dr said I had tumor; cut bone & stuff; another OR dr stretched tendons, etc" (12/09/2012)   TUBAL LIGATION  1985    SOCIAL HISTORY: Social History   Socioeconomic History   Marital status: Married    Spouse name: [name]   Number of children: 2   Years of education: Not on file   Highest education level: Not on file  Occupational History   Occupation: Retired  D[employer]  Tobacco Use   Smoking status: Former    Packs/day: 0.80    Years: 15.00    Total pack years: 12.00    Types: Cigarettes    Start date: 07/20/1976    Quit date: 07/20/1993    Years since quitting: 28.8   Smokeless tobacco: Never  Vaping Use   Vaping Use: Never used  Substance and Sexual Activity   Alcohol use: No    Alcohol/week: 0.0 standard drinks of  alcohol   Drug use: No   Sexual activity: Yes  Other Topics Concern   Not on file  Social History Narrative   Not on file   Social Determinants of Health   Financial Resource Strain: Not on file  Food Insecurity: Not on file  Transportation Needs: Not on file  Physical Activity: Not on file  Stress: Not on file  Social Connections: Not on file  Intimate Partner Violence: Not on file    FAMILY HISTORY: Family History  Problem Relation Age of Onset   Stroke Mother    Cirrhosis Father    Hypertension Father    Esophageal cancer Sister    Colon polyps Sister        pre-cancerous, part of colon removed   Prostate cancer Brother    Colon cancer Neg Hx    Rectal cancer Neg Hx    Stomach cancer Neg Hx     ALLERGIES:  is allergic to erythromycin base, azithromycin, pollen extract, remeron [mirtazapine], zolpidem tartrate, other, sulfonamide derivatives, and tree extract.  MEDICATIONS:  Current Outpatient Medications  Medication Sig Dispense Refill   albuterol (PROVENTIL HFA;VENTOLIN HFA) 108 (90 Base) MCG/ACT inhaler Inhale 2 puffs into the lungs every 4 (four) hours as needed for shortness of breath (Asthma).  3    ALPRAZolam (XANAX) 1 MG tablet Take 1 mg by mouth at bedtime.  1   aspirin EC 81 MG tablet Take 81 mg by mouth daily.     atorvastatin (LIPITOR) 40 MG tablet Take 1 tablet (40 mg total) by mouth daily. 90 tablet 3   BIOTIN PO Take 1 tablet by mouth daily.     cetirizine (ZYRTEC) 10 MG tablet Take 10 mg by mouth daily as needed for allergies.     Cholecalciferol (VITAMIN D-3) 125 MCG (5000 UT) TABS Take 10,000 Units by mouth daily.     dicyclomine (BENTYL) 10 MG capsule TAKE 1 CAPSULE (10 MG TOTAL) BY MOUTH 3 (THREE) TIMES DAILY BEFORE MEALS. 270 capsule 1   diltiazem (CARDIZEM CD) 120 MG 24 hr capsule TAKE 1 CAPSULE (120 MG TOTAL) BY MOUTH EVERY MORNING 90 capsule 1   DULoxetine (CYMBALTA) 60 MG capsule Take 60 mg by mouth daily.     HYDROcodone-acetaminophen (NORCO/VICODIN) 5-325 MG tablet Take 1 tablet by mouth 2 (two) times daily.  0   isosorbide mononitrate (IMDUR) 60 MG 24 hr tablet Take 1 tablet (60 mg total) by mouth daily. 90 tablet 3   losartan-hydrochlorothiazide (HYZAAR) 100-25 MG tablet Take 1 tablet by mouth daily. 90 tablet 3   magnesium oxide (MAG-OX) 400 (240 Mg) MG tablet TAKE 1 TABLET BY MOUTH EVERY DAY 90 tablet 1   metoprolol tartrate (LOPRESSOR) 25 MG tablet TAKE 1 TABLET BY MOUTH TWICE A DAY 180 tablet 3   montelukast (SINGULAIR) 10 MG tablet Take 10 mg by mouth at bedtime.      Multiple Vitamins-Minerals (MULTIVITAMIN WITH MINERALS) tablet Take 1 tablet by mouth daily.     nitroGLYCERIN (NITROSTAT) 0.4 MG SL tablet Place 1 tablet (0.4 mg total) under the tongue every 5 (five) minutes x 3 doses as needed (if no relief after 3rd dose, proceed to the ED for an evaluation or call 911). 25 tablet 3   NOVOLOG FLEXPEN 100 UNIT/ML FlexPen Inject 0-30 Units into the skin 3 (three) times daily as needed for high blood sugar (If blood glucose is above 200).     omeprazole (PRILOSEC) 40 MG capsule TAKE 1 CAPSULE BY MOUTH 2  TIMES A DAY 180 capsule 2   ondansetron (ZOFRAN) 4 MG tablet  TAKE 1 TABLET BY MOUTH EVERY 8 HOURS AS NEEDED FOR NAUSEA AND VOMITING (Patient taking differently: Taking 1/2 at bedtime) 45 tablet 2   promethazine (PHENERGAN) 25 MG tablet Take 25 mg by mouth every 6 (six) hours as needed for nausea or vomiting.     traZODone (DESYREL) 50 MG tablet Take 150 mg by mouth at bedtime.     TRESIBA FLEXTOUCH 200 UNIT/ML FlexTouch Pen Inject 40 Units into the skin daily. As directed (Patient taking differently: Inject 45 Units into the skin 2 (two) times daily. As directed Between novolog)     No current facility-administered medications for this visit.    REVIEW OF SYSTEMS:   Constitutional: ( - ) fevers, ( - )  chills , ( + ) night sweats Eyes: ( - ) blurriness of vision, ( - ) double vision, ( - ) watery eyes Ears, nose, mouth, throat, and face: ( - ) mucositis, ( - ) sore throat Respiratory: ( - ) cough, ( - ) dyspnea, ( - ) wheezes Cardiovascular: ( - ) palpitation, ( - ) chest discomfort, ( - ) lower extremity swelling Gastrointestinal:  ( + ) nausea, ( - ) heartburn, ( + ) change in bowel habits Skin: ( - ) abnormal skin rashes Lymphatics: ( - ) new lymphadenopathy, ( - ) easy bruising Neurological: ( - ) numbness, ( - ) tingling, ( - ) new weaknesses Behavioral/Psych: ( - ) mood change, ( - ) new changes  All other systems were reviewed with the patient and are negative.  PHYSICAL EXAMINATION: ECOG PERFORMANCE STATUS: 0 - Asymptomatic  Vitals:   05/22/22 1127  BP: (!) 134/54  Pulse: 61  Resp: 14  Temp: 98.1 F (36.7 C)  SpO2: 98%   Filed Weights   05/22/22 1127  Weight: 163 lb (73.9 kg)    GENERAL: alert, no distress and comfortable SKIN: skin color, texture, turgor are normal, no rashes or significant lesions EYES: conjunctiva are pink and non-injected, sclera clear NECK: supple, non-tender LYMPH:  no palpable lymphadenopathy in the cervical or inguinal LUNGS: clear to auscultation and percussion with normal breathing effort HEART:  regular rate & rhythm and no murmurs and no lower extremity edema ABDOMEN: soft, non-tender, non-distended, normal bowel sounds Musculoskeletal: no cyanosis of digits and no clubbing  PSYCH: alert & oriented x 3, fluent speech NEURO: no focal motor/sensory deficits  LABORATORY DATA:  I have reviewed the data as listed    Latest Ref Rng & Units 05/22/2022   10:26 AM 03/27/2022   10:15 AM 12/24/2021    3:14 PM  CBC  WBC 4.0 - 10.5 K/uL 11.3  11.3  13.8   Hemoglobin 12.0 - 15.0 g/dL 11.1  9.8  11.3   Hematocrit 36.0 - 46.0 % 33.3  29.2  33.7   Platelets 150 - 400 K/uL 203  222  248        Latest Ref Rng & Units 05/22/2022   10:26 AM 03/27/2022   10:15 AM 12/24/2021    3:14 PM  CMP  Glucose 70 - 99 mg/dL 182  254  231   BUN 8 - 23 mg/dL _0 Creatinine 0.44 - 1.00 mg/dL 0.72  0.71  0.84   Sodium 135 - 145 mmol/L 138  134  137   Potassium 3.5 - 5.1 mmol/L 3.8  3.8  4.2   Chloride 98 -  111 mmol/L 101  98  99   CO2 22 - 32 mmol/L 34  31  29   Calcium 8.9 - 10.3 mg/dL 9.7  9.7  9.5   Total Protein 6.5 - 8.1 g/dL 6.5  6.5  6.9   Total Bilirubin 0.3 - 1.2 mg/dL 0.4  0.4  0.6   Alkaline Phos 38 - 126 U/L 83  79  70   AST 15 - 41 U/L _0 ALT 0 - 44 U/L _1 RADIOGRAPHIC STUDIES: No results found.  ASSESSMENT & PLAN [name] 71 y.o. female with medical history significant for iron deficiency anemia due to GI blood loss and newly diagnosed CLL who presents for a follow up visit.  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 Due to GI intolerance, okay to discontinue --Labs today show worsening anemia with Hgb 11.1. Iron panel pending.  --Recommend another round of IV iron therapy in the event she is found to be  --Continue incorporate iron rich foods  into diet.  --RTC in 12 weeks with labs f/u visit.    #CLL Rai Stage 0: --There is evidence of mild lymphocytosis that has been present  since August 2022.  --Today's labs show absolute lymphocyte count of 6.3 -- flow cytometry shows the presence of CLL. --Patient does have occasional sweats but I do not believe this is related to her CLL.  Could consider that a B symptoms start treatment if persistent or worsening --Indications for CLL treatment noted below. --No clear indication to start treatment at this time.  Recommend observation.     No orders of the defined types were placed in this encounter.   All questions were answered. The patient knows to call the clinic with any problems, questions or concerns.  A total of more than 30 minutes were spent on this encounter with face-to-face time and non-face-to-face time, including preparing to see the patient, ordering tests and/or medications, counseling the patient and coordination of care as outlined above.   Ledell Peoples, MD Department of Hematology/Oncology [place] [facility] Phone: [phone] Pager: [phone] Email: [email or web address]  05/22/2022 11:54 AM  Kristian Covey BD, Catovsky D, Caligaris-Cappio F, Dighiero G, Dhner H, [place], [place], [place] E, Chiorazzi N, [place], Rai KR, [place], Eichhorst B, O'Brien S, Robak T, Seymour JF, Kipps TJ. iwCLL guidelines for diagnosis, indications for treatment, response assessment, and supportive management of CLL. Blood. 2018 Jun 21;131(25):2745-2760.  Active disease should be clearly documented to initiate therapy. At least 1 of the following criteria should be met.  1) Evidence of progressive marrow failure as manifested by the development of, or worsening of, anemia and/or thrombocytopenia. Cutoff levels of Hb <10 g/dL or platelet counts <100  109/L are generally regarded as indication for treatment. However, in some patients, platelet counts <100  109/L may remain stable over a long period; this situation does not automatically require therapeutic  intervention. 2) Massive (ie, ?6 cm below the left costal margin) or progressive or symptomatic splenomegaly. 3) Massive nodes (ie, ?10 cm in longest diameter) or progressive or symptomatic lymphadenopathy. 4) Progressive lymphocytosis with an increase of ?50% over a 11-monthperiod, or lymphocyte doubling time (LDT) <6 months. LDT can be obtained by linear regression extrapolation of absolute lymphocyte counts obtained at intervals of 2 weeks over an observation  period of 2 to 3 months; patients with initial blood lymphocyte counts <30  109/L may require a longer observation period to determine the LDT. Factors contributing to lymphocytosis other than CLL (eg, infections, steroid administration) should be excluded. 5) Autoimmune complications including anemia or thrombocytopenia poorly responsive to corticosteroids. 6) Symptomatic or functional extranodal involvement (eg, skin, kidney, lung, spine). Disease-related symptoms as defined by any of the following: Unintentional weight loss ?10% within the previous 6 months. Significant fatigue (ie, ECOG performance scale 2 or worse; cannot work or unable to perform usual activities). Fevers ?100.56F or 38.0C for 2 or more weeks without evidence of infection. Night sweats for ?1 month without evidence of infection.

## 2022-05-26 ENCOUNTER — Encounter: Payer: Self-pay | Admitting: Hematology and Oncology

## 2022-05-26 ENCOUNTER — Encounter: Payer: Self-pay | Admitting: Physician Assistant

## 2022-05-27 ENCOUNTER — Telehealth: Payer: Self-pay | Admitting: *Deleted

## 2022-05-27 ENCOUNTER — Other Ambulatory Visit: Payer: Self-pay | Admitting: Hematology and Oncology

## 2022-05-27 NOTE — Telephone Encounter (Addendum)
Contacted patient as directed by Dr. Lorenso Courier with message below. Patient states she is in agreement and would like to have additional IV iron. Dr. Lorenso Courier informed   ----- Message from Orson Slick, MD sent at 05/26/2022  4:32 PM EST ----- Please let [name] know that her iron levels are still borderline low.  She may benefit from another round of IV iron therapy.  If she is agreeable we will have this set up expediently.

## 2022-05-28 ENCOUNTER — Telehealth: Payer: Self-pay | Admitting: Pharmacy Technician

## 2022-05-28 NOTE — Telephone Encounter (Signed)
Dr. Lorenso Courier, Juluis Rainier note:  Auth Submission: APPROVED Payer: [employer] Medication & CPT/J Code(s) submitted: Monoferric (Ferrci derisomaltose) [phone] Route of submission (phone, fax, portal):  Phone # Fax # Auth type: Buy/Bill Units/visits requested: X1 DOSE Reference number: [id number] Approval from: 05/28/22 to 11/26/22   Patient will be scheduled as soon as possible

## 2022-06-03 ENCOUNTER — Encounter: Payer: Self-pay | Admitting: Gastroenterology

## 2022-06-03 ENCOUNTER — Ambulatory Visit: Payer: Medicare HMO | Admitting: Gastroenterology

## 2022-06-03 ENCOUNTER — Other Ambulatory Visit (INDEPENDENT_AMBULATORY_CARE_PROVIDER_SITE_OTHER): Payer: Medicare HMO

## 2022-06-03 VITALS — BP 122/60 | HR 60 | Ht 62.0 in | Wt 163.5 lb

## 2022-06-03 DIAGNOSIS — K582 Mixed irritable bowel syndrome: Secondary | ICD-10-CM

## 2022-06-03 DIAGNOSIS — I8510 Secondary esophageal varices without bleeding: Secondary | ICD-10-CM | POA: Diagnosis not present

## 2022-06-03 DIAGNOSIS — R1084 Generalized abdominal pain: Secondary | ICD-10-CM

## 2022-06-03 DIAGNOSIS — D500 Iron deficiency anemia secondary to blood loss (chronic): Secondary | ICD-10-CM

## 2022-06-03 DIAGNOSIS — K7469 Other cirrhosis of liver: Secondary | ICD-10-CM | POA: Diagnosis not present

## 2022-06-03 LAB — PROTIME-INR
INR: 1.1 ratio — ABNORMAL HIGH (ref 0.8–1.0)
Prothrombin Time: 11.6 s (ref 9.6–13.1)

## 2022-06-03 NOTE — Progress Notes (Signed)
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  Followed by Dr. Narda Rutherford for periodic iron infusions (has one scheduled tomorrow).  Chronic abdominal pain and IBS-like picture primarily constipation with intermittent diarrhea.  Little improvement with dicyclomine.  Rec'd an empiric course of metronidazole last visit for possible SIBO. _______________________________   [name] was here with her husband today.  She continues to have chronic generalized abdominal pain, usually more focal in the right upper quadrant as well as crampy bilateral lower abdominal pain before and after BMs.  She still tends toward constipation with a BM about every other day taking Dulcolax.  When that medicine takes effect, she has cramps and loose stool, for which she then takes dicyclomine.  Receiving IV iron regularly as noted above.  She denies black or bloody stool.  Intermittent nausea, no vomiting, denies dysphagia chest pain or dyspnea. She had recent follow-up with her cardiologist Dr. Domenic Polite, whose note was reviewed.  She had discussed the possibility of either cutting down or stopping aspirin in hopes it might decrease GI blood loss and need for iron infusions.  Dr. Domenic Polite was agreeable to her taking 81 mg dose every other day instead of daily.  She has a history of coronary disease with a stent in 2014.   ROS:  Review of Systems  Constitutional:  Positive for fatigue. Negative for appetite change  and unexpected weight change.  HENT:  Negative for mouth sores and voice change.   Eyes:  Negative for pain and redness.  Respiratory:  Negative for cough and shortness of breath.   Cardiovascular:  Negative for chest pain and palpitations.  Genitourinary:  Negative for dysuria and hematuria.  Musculoskeletal:  Positive for arthralgias and myalgias.  Skin:  Negative for pallor and rash.  Neurological:  Negative for weakness and headaches.  Hematological:  Negative for adenopathy.     Past Medical History: Past Medical History:  Diagnosis Date   Anxiety    Arthritis    Asthma    Barrett esophagus    Chronic bronchitis (HCC)    Chronic diarrhea    Chronic lower back pain    CLL (chronic lymphocytic leukemia) ([place]) 01/11/2022   Coronary atherosclerosis of native coronary artery    a. s/p DES to LAD in 2014 b. cath in 12/2017 showing patent LAD stent with mild to moderate RCA and LCx disease c. cath in 04/2021 showing patent LAD stent with 60-70% RCA stenosis but not significant by FFR   Diverticulosis    Essential hypertension    Fatty liver    Fibromyalgia    GERD (gastroesophageal reflux disease)    H/O hiatal hernia    IBS (irritable bowel syndrome)    Major depression in partial remission ([place])    Migraines    Mixed hyperlipidemia    Myocardial infarction ([place])    2014   OSA on CPAP    Osteoporosis    Pancreatitis 2008   Pneumonia 1990's   Schatzki's ring    Sleep apnea  Type 2 diabetes mellitus ([place])      Past Surgical History: Past Surgical History:  Procedure Laterality Date   20 HOUR [place] STUDY N/A 02/16/2018   Procedure: 24 HOUR PH STUDY;  Surgeon: Mauri Pole, MD;  Location: [facility];  Service: Endoscopy;  Laterality: N/A;   ABDOMINAL HYSTERECTOMY  ?1987   APPENDECTOMY  ?1987   BIOPSY  03/02/2021   Procedure: BIOPSY;  Surgeon: Lavena Bullion, DO;  Location: [facility];  Service: Gastroenterology;;   BUNIONECTOMY Bilateral [place] Bilateral ?1990   CATARACT EXTRACTION W/ INTRAOCULAR LENS  IMPLANT, BILATERAL Bilateral ?2005   [place]  1990'S   COLONOSCOPY  10/13   Dr. Britta Mccreedy - tubular adenomas and diverticulosis   COLONOSCOPY WITH PROPOFOL N/A 08/28/2021   Procedure: COLONOSCOPY WITH PROPOFOL;  Surgeon: Irving Copas., MD;  Location: [facility];  Service: Gastroenterology;  Laterality: N/A;  ultra slim scope   DILATION AND CURETTAGE OF UTERUS  1970's   "probably 3" (12/09/2012)   ERCP  2008   ESOPHAGEAL DILATION  ` 2012   ESOPHAGEAL MANOMETRY N/A 10/21/2015   Procedure: ESOPHAGEAL MANOMETRY (EM);  Surgeon: Doran Stabler, MD;  Location: [facility];  Service: Gastroenterology;  Laterality: N/A;   ESOPHAGEAL MANOMETRY N/A 02/16/2018   Procedure: ESOPHAGEAL MANOMETRY (EM);  Surgeon: Mauri Pole, MD;  Location: [facility];  Service: Endoscopy;  Laterality: N/A;   ESOPHAGOGASTRODUODENOSCOPY Left 03/02/2021   Procedure: ESOPHAGOGASTRODUODENOSCOPY (EGD);  Surgeon: Lavena Bullion, DO;  Location: [facility];  Service: Gastroenterology;  Laterality: Left;   HUMERUS FRACTURE SURGERY Right 07/19/1969   "horst threw me" (12/09/2012)   INTRAVASCULAR PRESSURE WIRE/FFR STUDY N/A 05/12/2021   Procedure: INTRAVASCULAR PRESSURE WIRE/FFR STUDY;  Surgeon: Nelva Bush, MD;  Location: [facility];  Service: Cardiovascular;  Laterality: N/A;   LEFT HEART CATH AND CORONARY ANGIOGRAPHY N/A 12/29/2017   Procedure: LEFT HEART CATH AND CORONARY ANGIOGRAPHY;  Surgeon: Wellington Hampshire, MD;  Location: [facility];  Service: Cardiovascular;  Laterality: N/A;   LEFT HEART CATH AND CORONARY ANGIOGRAPHY N/A 05/12/2021   Procedure: LEFT HEART CATH AND CORONARY ANGIOGRAPHY;  Surgeon: Nelva Bush, MD;  Location: [facility];  Service: Cardiovascular;  Laterality: N/A;   NASAL SEPTUM SURGERY  1970's   PERCUTANEOUS CORONARY STENT INTERVENTION (PCI-S) N/A  12/09/2012   Procedure: PERCUTANEOUS CORONARY STENT INTERVENTION (PCI-S);  Surgeon: Burnell Blanks, MD;  Location: [facility];  Service: Cardiovascular;  Laterality: N/A;   [place] IMPEDANCE STUDY N/A 02/16/2018   Procedure: [place] IMPEDANCE STUDY;  Surgeon: Mauri Pole, MD;  Location: [facility];  Service: Endoscopy;  Laterality: N/A;   TOE FUSION Right 10/11   First MTP joint   TOE SURGERY Right 1990's   "toe next to big toe:  dr said I had tumor; cut bone & stuff; another OR dr stretched tendons, etc" (12/09/2012)   TUBAL LIGATION  1985     Family History: Family History  Problem Relation Age of Onset   Stroke Mother    Cirrhosis Father    Hypertension Father    Esophageal cancer Sister    Colon polyps Sister        pre-cancerous, part of colon removed   Prostate cancer Brother    Colon cancer Neg Hx    Rectal cancer Neg Hx    Stomach cancer Neg Hx     Social History: Social History  Socioeconomic History   Marital status: Married    Spouse name: [name]   Number of children: 2   Years of education: Not on file   Highest education level: Not on file  Occupational History   Occupation: Retired  [employer]  Tobacco Use   Smoking status: Former    Packs/day: 0.80    Years: 15.00    Total pack years: 12.00    Types: Cigarettes    Start date: 07/20/1976    Quit date: 07/20/1993    Years since quitting: 28.8   Smokeless tobacco: Never  Vaping Use   Vaping Use: Never used  Substance and Sexual Activity   Alcohol use: No    Alcohol/week: 0.0 standard drinks of alcohol   Drug use: No   Sexual activity: Yes  Other Topics Concern   Not on file  Social History Narrative   Not on file   Social Determinants of Health   Financial Resource Strain: Not on file  Food Insecurity: Not on file  Transportation Needs: Not on file  Physical Activity: Not on file  Stress: Not on file  Social Connections: Not on file    Allergies: Allergies  Allergen Reactions    Erythromycin Base Diarrhea   Azithromycin Other (See Comments)    Upset stomach    Pollen Extract Cough    Runny nose   Remeron [Mirtazapine] Other (See Comments)    Makes her too sleepy   Zolpidem Tartrate Other (See Comments)    amnesia   Other Rash and Other (See Comments)    Grass, dust, green beans, corn, cabbage Reaction: upset stomach   Sulfonamide Derivatives Rash    Mouth sores   Tree Extract Rash    Outpatient Meds: Current Outpatient Medications  Medication Sig Dispense Refill   albuterol (PROVENTIL HFA;VENTOLIN HFA) 108 (90 Base) MCG/ACT inhaler Inhale 2 puffs into the lungs every 4 (four) hours as needed for shortness of breath (Asthma).  3   ALPRAZolam (XANAX) 1 MG tablet Take 1 mg by mouth at bedtime.  1   aspirin EC 81 MG tablet Take 81 mg by mouth daily.     atorvastatin (LIPITOR) 40 MG tablet Take 1 tablet (40 mg total) by mouth daily. 90 tablet 3   BIOTIN PO Take 1 tablet by mouth daily.     cetirizine (ZYRTEC) 10 MG tablet Take 10 mg by mouth daily as needed for allergies.     Cholecalciferol (VITAMIN D-3) 125 MCG (5000 UT) TABS Take 10,000 Units by mouth daily.     dicyclomine (BENTYL) 10 MG capsule TAKE 1 CAPSULE (10 MG TOTAL) BY MOUTH 3 (THREE) TIMES DAILY BEFORE MEALS. 270 capsule 1   diltiazem (CARDIZEM CD) 120 MG 24 hr capsule TAKE 1 CAPSULE (120 MG TOTAL) BY MOUTH EVERY MORNING 90 capsule 1   DULoxetine (CYMBALTA) 60 MG capsule Take 60 mg by mouth daily.     HYDROcodone-acetaminophen (NORCO/VICODIN) 5-325 MG tablet Take 1 tablet by mouth 2 (two) times daily.  0   isosorbide mononitrate (IMDUR) 60 MG 24 hr tablet Take 1 tablet (60 mg total) by mouth daily. 90 tablet 3   losartan-hydrochlorothiazide (HYZAAR) 100-25 MG tablet Take 1 tablet by mouth daily. 90 tablet 3   magnesium oxide (MAG-OX) 400 (240 Mg) MG tablet TAKE 1 TABLET BY MOUTH EVERY DAY 90 tablet 1   metoprolol tartrate (LOPRESSOR) 25 MG tablet TAKE 1 TABLET BY MOUTH TWICE A DAY 180 tablet 3    montelukast (SINGULAIR) 10 MG tablet  Take 10 mg by mouth at bedtime.      Multiple Vitamins-Minerals (MULTIVITAMIN WITH MINERALS) tablet Take 1 tablet by mouth daily.     nitroGLYCERIN (NITROSTAT) 0.4 MG SL tablet Place 1 tablet (0.4 mg total) under the tongue every 5 (five) minutes x 3 doses as needed (if no relief after 3rd dose, proceed to the ED for an evaluation or call 911). 25 tablet 3   NOVOLOG FLEXPEN 100 UNIT/ML FlexPen Inject 0-30 Units into the skin 3 (three) times daily as needed for high blood sugar (If blood glucose is above 200).     omeprazole (PRILOSEC) 40 MG capsule TAKE 1 CAPSULE BY MOUTH 2 TIMES A DAY 180 capsule 2   ondansetron (ZOFRAN) 4 MG tablet TAKE 1 TABLET BY MOUTH EVERY 8 HOURS AS NEEDED FOR NAUSEA AND VOMITING (Patient taking differently: Taking 1/2 at bedtime) 45 tablet 2   promethazine (PHENERGAN) 25 MG tablet Take 25 mg by mouth every 6 (six) hours as needed for nausea or vomiting.     traZODone (DESYREL) 50 MG tablet Take 150 mg by mouth at bedtime.     TRESIBA FLEXTOUCH 200 UNIT/ML FlexTouch Pen Inject 40 Units into the skin daily. As directed (Patient taking differently: Inject 45 Units into the skin 2 (two) times daily. As directed Between novolog)     No current facility-administered medications for this visit.      ___________________________________________________________________ Objective   Exam:  BP 122/60   Pulse 60   Ht '5\' 2"'$  (1.575 m)   Wt 163 lb 8 oz (74.2 kg)   SpO2 96%   BMI 29.90 kg/m  Wt Readings from Last 3 Encounters:  06/03/22 163 lb 8 oz (74.2 kg)  05/22/22 163 lb (73.9 kg)  05/15/22 163 lb (73.9 kg)    General: Not acutely ill-appearing.  Ambulatory, gets on exam table easily, alert and conversational Eyes: sclera anicteric, no redness ENT: oral mucosa moist without lesions, no cervical or supraclavicular lymphadenopathy CV: Regular without murmur, no JVD, no peripheral edema Resp: clear to auscultation bilaterally, normal  RR and effort noted GI: soft, generalized tenderness, more so right upper quadrant as before.  Left lobe liver enlarged., with active bowel sounds.  No distention Skin; warm and dry, no rash or jaundice noted Neuro: awake, alert and oriented x 3. Normal gross motor function and fluent speech  Labs:     Latest Ref Rng & Units 05/22/2022   10:26 AM 03/27/2022   10:15 AM 12/24/2021    3:14 PM  CBC  WBC 4.0 - 10.5 K/uL 11.3  11.3  13.8   Hemoglobin 12.0 - 15.0 g/dL 11.1  9.8  11.3   Hematocrit 36.0 - 46.0 % 33.3  29.2  33.7   Platelets 150 - 400 K/uL 203  222  248       Latest Ref Rng & Units 05/22/2022   10:26 AM 03/27/2022   10:15 AM 12/24/2021    3:14 PM  CMP  Glucose 70 - 99 mg/dL 182  254  231   BUN 8 - 23 mg/dL '11  10  11   '$ Creatinine 0.44 - 1.00 mg/dL 0.72  0.71  0.84   Sodium 135 - 145 mmol/L 138  134  137   Potassium 3.5 - 5.1 mmol/L 3.8  3.8  4.2   Chloride 98 - 111 mmol/L 101  98  99   CO2 22 - 32 mmol/L 34  31  29   Calcium 8.9 - 10.3 mg/dL  9.7  9.7  9.5   Total Protein 6.5 - 8.1 g/dL 6.5  6.5  6.9   Total Bilirubin 0.3 - 1.2 mg/dL 0.4  0.4  0.6   Alkaline Phos 38 - 126 U/L 83  79  70   AST 15 - 41 U/L '23  21  30   '$ ALT 0 - 44 U/L '21  16  23    '$ Last AFP 5.3 and last INR 1.1   in May 2023  Lab Results  Component Value Date   IRON 54 05/22/2022   TIBC 440 05/22/2022   FERRITIN 30 05/22/2022   On 03/27/2022: Iron 43, TIBC 468, ferritin 8  Radiologic Studies:  Narrative & Impression  CLINICAL DATA:  Cirrhosis.  Cholecystectomy.   EXAM: ABDOMEN ULTRASOUND COMPLETE   COMPARISON:  Right upper quadrant ultrasound March 07, 2021. CT scan of the abdomen and pelvis March 01, 2021.   FINDINGS: Gallbladder: Surgically absent   Common bile duct: Diameter: 11.3 mm   Liver: Mildly nodular contour. No suspicious mass. Heterogeneous echotexture. Portal vein is patent on color Doppler imaging with normal direction of blood flow towards the liver.   IVC: No abnormality  visualized.   Pancreas: Visualized portion unremarkable.   Spleen: Size and appearance within normal limits.   Right Kidney: Length: 11.2 cm. There is a parapelvic cyst on the right, simple in appearance, of no clinical significance. No follow-up imaging recommended.   Left Kidney: Length: 11.3 cm. Echogenicity within normal limits. No mass or hydronephrosis visualized.   Abdominal aorta: No aneurysm visualized.   Other findings: None.   IMPRESSION: 1. The liver demonstrates a cirrhotic morphology with a nodular contour and heterogeneous echotexture. No suspicious liver masses identified. 2. The common bile duct measures 11.3 mm which may be normal status post cholecystectomy. Recommend correlation with labs to ensure no clinical signs of biliary obstruction. 3. No other significant abnormalities.     Electronically Signed   By: Dorise Bullion III M.D.   On: 12/19/2021 11:58    Assessment: Encounter Diagnoses  Name Primary?   Cryptogenic cirrhosis (HCC) Yes   Irritable bowel syndrome with both constipation and diarrhea    Secondary esophageal varices without bleeding (HCC)    Generalized abdominal pain    Iron deficiency anemia due to chronic blood loss     IBS, predominantly constipation with intermittent loose stool, seemingly after taking cathartics.  She has had lifelong symptoms of this condition, and we have previously discussed the challenges managing it, especially in the context of her other medical conditions and polypharmacy. I gave her samples of Linzess 72 mcg to try once daily instead of the Dulcolax, in hopes it might regulate her bowels a bit better and decrease the need for dicyclomine.  Her anemia is managed by hematology with periodic iron infusions.  She has had a thorough diagnostic and therapeutic endoscopic work-up.  Due for [facility] surveillance in the setting of cirrhosis. Plan: AFP, INR RUQ U/S  Follow-up with me 6 months for cirrhosis or  sooner if needed.   35 minutes were spent on this encounter (including chart review, history/exam, counseling/coordination of care, and documentation) > 50% of that time was spent on counseling and coordination of care.   Nelida Meuse III  CC: Referring provider noted above

## 2022-06-03 NOTE — Patient Instructions (Signed)
_______________________________________________________  If you are age 73 or older, your body mass index should be between 23-30. Your Body mass index is 29.9 kg/m. If this is out of the aforementioned range listed, please consider follow up with your Primary Care Provider.  If you are age 74 or younger, your body mass index should be between 19-25. Your Body mass index is 29.9 kg/m. If this is out of the aformentioned range listed, please consider follow up with your Primary Care Provider.   ________________________________________________________  The [facility] providers would like to encourage you to use [facility] to communicate with providers for non-urgent requests or questions.  Due to long hold times on the telephone, sending your provider a message by [facility] may be a faster and more efficient way to get a response.  Please allow 48 business hours for a response.  Please remember that this is for non-urgent requests.  _______________________________________________________  Your provider has requested that you go to the basement level for lab work before leaving today. Press "B" on the elevator. The lab is located at the first door on the left as you exit the elevator.  You have been scheduled for an abdominal ultrasound at [facility] (1st floor of hospital) on 06-15-2022 at 8:30am. Please arrive 30 minutes prior to your appointment for registration. Make certain not to have anything to eat or drink 6 hours prior to your appointment. Should you need to reschedule your appointment, please contact radiology at [phone]. This test typically takes about 30 minutes to perform.  Due to recent changes in healthcare laws, you may see the results of your imaging and laboratory studies on MyChart before your provider has had a chance to review them.  We understand that in some cases there may be results that are confusing or concerning to you. Not all laboratory results come back in the  same time frame and the provider may be waiting for multiple results in order to interpret others.  Please give [place] 48 hours in order for your provider to thoroughly review all the results before contacting the office for clarification of your results.   Medication Samples have been provided to the patient.  Drug name: Linzess       Strength: 72 mcg         Qty: 8  LOT: [id number]  Exp.Date: 03-2023  Dosing instructions: one tablet once a day.  The patient has been instructed regarding the correct time, dose, and frequency of taking this medication, including desired effects and most common side effects.   Linzess works best when taken once a day every day, on an empty stomach, at least 30 minutes before your first meal of the day.  When Linzess is taken daily as directed:  *Constipation relief is typically felt in about a week *IBS-C patients may begin to experience relief from belly pain and overall abdominal symptoms (pain, discomfort, and bloating) in about 1 week,   with symptoms typically improving over 12 weeks.  Diarrhea may occur in the first 2 weeks -keep taking it.  The diarrhea should go away and you should start having normal, complete, full bowel movements. It may be helpful to start treatment when you can be near the comfort of your own bathroom, such as a weekend.     It was a pleasure to see you today!  Thank you for trusting me with your gastrointestinal care!

## 2022-06-04 ENCOUNTER — Ambulatory Visit (INDEPENDENT_AMBULATORY_CARE_PROVIDER_SITE_OTHER): Payer: Medicare HMO

## 2022-06-04 VITALS — BP 135/59 | HR 54 | Temp 98.2°F | Resp 18 | Ht 62.0 in | Wt 165.0 lb

## 2022-06-04 DIAGNOSIS — D500 Iron deficiency anemia secondary to blood loss (chronic): Secondary | ICD-10-CM

## 2022-06-04 LAB — AFP TUMOR MARKER: AFP-Tumor Marker: 5.2 ng/mL

## 2022-06-04 MED ORDER — SODIUM CHLORIDE 0.9 % IV SOLN
1000.0000 mg | Freq: Once | INTRAVENOUS | Status: AC
  Administered 2022-06-04: 1000 mg via INTRAVENOUS
  Filled 2022-06-04: qty 10

## 2022-06-04 NOTE — Progress Notes (Signed)
Diagnosis: Iron Deficiency Anemia  Provider:  Marshell Garfinkel MD  Procedure: Infusion  IV Type: Peripheral, IV Location: L Antecubital  Monoferric (Ferric Derisomaltose), Dose: 1000 mg  Infusion Start Time: 5831  Infusion Stop Time: 6742  Post Infusion IV Care: Observation period completed and Peripheral IV Discontinued  Discharge: Condition: Good, Destination: Home . AVS provided to patient.   Performed by:  Cleophus Molt, RN

## 2022-06-04 NOTE — Patient Instructions (Signed)

## 2022-06-15 ENCOUNTER — Ambulatory Visit (HOSPITAL_COMMUNITY)
Admission: RE | Admit: 2022-06-15 | Discharge: 2022-06-15 | Disposition: A | Discharge status: Home / Self Care | Payer: Medicare HMO | Source: Ambulatory Visit | Attending: Gastroenterology | Admitting: Gastroenterology

## 2022-06-15 DIAGNOSIS — I8510 Secondary esophageal varices without bleeding: Secondary | ICD-10-CM

## 2022-06-15 DIAGNOSIS — K582 Mixed irritable bowel syndrome: Secondary | ICD-10-CM

## 2022-06-15 DIAGNOSIS — R1084 Generalized abdominal pain: Secondary | ICD-10-CM

## 2022-06-15 DIAGNOSIS — K7469 Other cirrhosis of liver: Secondary | ICD-10-CM | POA: Diagnosis present

## 2022-06-15 DIAGNOSIS — D500 Iron deficiency anemia secondary to blood loss (chronic): Secondary | ICD-10-CM

## 2022-06-27 ENCOUNTER — Other Ambulatory Visit: Payer: Self-pay | Admitting: Cardiology

## 2022-07-03 ENCOUNTER — Other Ambulatory Visit: Payer: Self-pay

## 2022-07-03 ENCOUNTER — Other Ambulatory Visit: Payer: Self-pay | Admitting: Cardiology

## 2022-07-03 ENCOUNTER — Other Ambulatory Visit: Payer: Self-pay | Admitting: Gastroenterology

## 2022-07-03 MED ORDER — ISOSORBIDE MONONITRATE ER 60 MG PO TB24: 60.0000 mg | ORAL_TABLET | Freq: Every day | ORAL | 3 refills | Status: DC

## 2022-07-15 ENCOUNTER — Other Ambulatory Visit: Payer: Self-pay | Admitting: Cardiology

## 2022-08-17 ENCOUNTER — Telehealth: Payer: Self-pay

## 2022-08-17 NOTE — Telephone Encounter (Signed)
T/C from pt stating she has a Dental appt on 08/20/22 for cleaning only.  She wants to know if she needs a letter for approval or if she needs a prescription for prophylactic antibiotics.

## 2022-08-17 NOTE — Telephone Encounter (Signed)
Pt advised.

## 2022-08-17 NOTE — Telephone Encounter (Signed)
Okay to proceed with dental cleaning. No need for clearance letter or prophylactic antibiotics unless dentist requires either.

## 2022-08-21 ENCOUNTER — Inpatient Hospital Stay: Payer: Medicare HMO | Attending: Physician Assistant

## 2022-08-21 ENCOUNTER — Other Ambulatory Visit: Payer: Self-pay | Admitting: Hematology and Oncology

## 2022-08-21 ENCOUNTER — Inpatient Hospital Stay: Payer: Medicare HMO | Admitting: Hematology and Oncology

## 2022-08-21 VITALS — BP 157/56 | HR 70 | Temp 98.0°F | Resp 15 | Wt 166.4 lb

## 2022-08-21 DIAGNOSIS — R110 Nausea: Secondary | ICD-10-CM | POA: Diagnosis not present

## 2022-08-21 DIAGNOSIS — C9110 Chronic lymphocytic leukemia of B-cell type not having achieved remission: Secondary | ICD-10-CM | POA: Insufficient documentation

## 2022-08-21 DIAGNOSIS — K922 Gastrointestinal hemorrhage, unspecified: Secondary | ICD-10-CM | POA: Insufficient documentation

## 2022-08-21 DIAGNOSIS — D500 Iron deficiency anemia secondary to blood loss (chronic): Secondary | ICD-10-CM

## 2022-08-21 DIAGNOSIS — Z87891 Personal history of nicotine dependence: Secondary | ICD-10-CM | POA: Insufficient documentation

## 2022-08-21 DIAGNOSIS — Z800 Family history of malignant neoplasm of digestive organs: Secondary | ICD-10-CM | POA: Insufficient documentation

## 2022-08-21 LAB — RETIC PANEL
Immature Retic Fract: 22.3 % — ABNORMAL HIGH (ref 2.3–15.9)
RBC.: 3.38 MIL/uL — ABNORMAL LOW (ref 3.87–5.11)
Retic Count, Absolute: 89.9 10*3/uL (ref 19.0–186.0)
Retic Ct Pct: 2.7 % (ref 0.4–3.1)
Reticulocyte Hemoglobin: 29 pg (ref >27.9)

## 2022-08-21 LAB — CMP (CANCER CENTER ONLY)
ALT: 19 U/L (ref 0–44)
AST: 23 U/L (ref 15–41)
Albumin: 3.9 g/dL (ref 3.5–5.0)
Alkaline Phosphatase: 104 U/L (ref 38–126)
Anion gap: 6 (ref 5–15)
BUN: 8 mg/dL (ref 8–23)
CO2: 28 mmol/L (ref 22–32)
Calcium: 9.4 mg/dL (ref 8.9–10.3)
Chloride: 100 mmol/L (ref 98–111)
Creatinine: 0.7 mg/dL (ref 0.44–1.00)
GFR, Estimated: >60 mL/min (ref >60)
Glucose, Bld: 228 mg/dL — ABNORMAL HIGH (ref 70–99)
Potassium: 3.9 mmol/L (ref 3.5–5.1)
Sodium: 134 mmol/L — ABNORMAL LOW (ref 135–145)
Total Bilirubin: 0.4 mg/dL (ref 0.3–1.2)
Total Protein: 6.4 g/dL — ABNORMAL LOW (ref 6.5–8.1)

## 2022-08-21 LAB — CBC WITH DIFFERENTIAL (CANCER CENTER ONLY)
Abs Immature Granulocytes: 0.03 10*3/uL (ref 0.00–0.07)
Basophils Absolute: 0.1 10*3/uL (ref 0.0–0.1)
Basophils Relative: 0 %
Eosinophils Absolute: 0.2 10*3/uL (ref 0.0–0.5)
Eosinophils Relative: 1 %
HCT: 31.7 % — ABNORMAL LOW (ref 36.0–46.0)
Hemoglobin: 11 g/dL — ABNORMAL LOW (ref 12.0–15.0)
Immature Granulocytes: 0 %
Lymphocytes Relative: 61 %
Lymphs Abs: 8 10*3/uL — ABNORMAL HIGH (ref 0.7–4.0)
MCH: 33.1 pg (ref 26.0–34.0)
MCHC: 34.7 g/dL (ref 30.0–36.0)
MCV: 95.5 fL (ref 80.0–100.0)
Monocytes Absolute: 1 10*3/uL (ref 0.1–1.0)
Monocytes Relative: 8 %
Neutro Abs: 4 10*3/uL (ref 1.7–7.7)
Neutrophils Relative %: 30 %
Platelet Count: 230 10*3/uL (ref 150–400)
RBC: 3.32 MIL/uL — ABNORMAL LOW (ref 3.87–5.11)
RDW: 12.3 % (ref 11.5–15.5)
Smear Review: NORMAL
WBC Count: 13.3 10*3/uL — ABNORMAL HIGH (ref 4.0–10.5)
nRBC: 0 % (ref 0.0–0.2)

## 2022-08-21 LAB — IRON AND IRON BINDING CAPACITY (CC-WL,HP ONLY)
Iron: 67 ug/dL (ref 28–170)
Saturation Ratios: 14 % (ref 10.4–31.8)
TIBC: 473 ug/dL — ABNORMAL HIGH (ref 250–450)
UIBC: 406 ug/dL (ref 148–442)

## 2022-08-21 LAB — LACTATE DEHYDROGENASE: LDH: 149 U/L (ref 98–192)

## 2022-08-21 LAB — FERRITIN: Ferritin: 10 ng/mL — ABNORMAL LOW (ref 11–307)

## 2022-08-21 NOTE — Progress Notes (Signed)
[place] [phone]   [phone]  PROGRESS NOTE  Patient Care Team: Caryl Bis, MD as PCP - General (Family Medicine) [name], MD as PCP - Cardiology (Cardiology) [name], MD as Consulting Physician (Cardiology) Deneise Lever, MD as Consulting Physician (Pulmonary Disease) Dr. 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 Lorenso Courier and Dede Query 12/24/2021: flow cytometry confirms lymphocytosis is CLL.   Interval History:  [name] 72 y.o. female with medical history significant for iron deficiency anemia due to GI blood loss and CLL who presents for a follow up visit. The patient's last visit was on 05/22/2022. In the interim since the last visit, she completed another round of IV iron infusion.   On exam today [name] reports she received IV iron in November 2023 and she "feels a little better".  She reports she is not sleeping as much and able to get out and do housework.  She reports that unfortunately she did just have an episode of pneumonia.  She reports that her iron infusion went well though she did have a little bit of sickness and a bad day afterwards.  She describes it as "queasiness".  She notes her energy is currently about a 4 out of 10.  She thinks this may be due to having poor sleep last night.  She reports that if she blows her nose she can occasionally see "right much blood".  She notes that she does have some occasional stomach cramps but no dark stools.  She does occasionally see some bright red blood on the stool.  She notes that there is also sometimes a little  bit of bleeding when she gives herself her insulin shots.  She notes that she is unable to tolerate her iron pills due to cramping and diarrhea.  She is doing her best to try to eat red meat and collard greens.  She denies fevers, chills, shortness of breath, chest pain or cough.  She has no other complaints.  A full 10 point ROS is listed below.  MEDICAL HISTORY:  Past Medical History:  Diagnosis Date   Anxiety    Arthritis    Asthma    Barrett esophagus    Chronic bronchitis (HCC)    Chronic diarrhea    Chronic lower back pain    CLL (chronic lymphocytic leukemia) ([place]) 01/11/2022   Coronary atherosclerosis of native coronary artery    a. s/p DES to LAD in 2014 b. cath in 12/2017 showing patent LAD stent with mild to moderate RCA and LCx disease c. cath in 04/2021 showing patent LAD stent with 60-70% RCA stenosis but not significant by FFR   Diverticulosis    Essential hypertension    Fatty liver    Fibromyalgia    GERD (gastroesophageal reflux disease)    H/O hiatal hernia    IBS (irritable bowel syndrome)    Major depression in partial remission ([place])    Migraines    Mixed hyperlipidemia    Myocardial infarction ([place])    2014   OSA on CPAP    Osteoporosis    Pancreatitis 2008   Pneumonia 1990's  Schatzki's ring    Sleep apnea    Type 2 diabetes mellitus ([place])     SURGICAL HISTORY: Past Surgical History:  Procedure Laterality Date   55 HOUR [place] STUDY N/A 02/16/2018   Procedure: 24 HOUR PH STUDY;  Surgeon: Mauri Pole, MD;  Location: [facility];  Service: Endoscopy;  Laterality: N/A;   ABDOMINAL HYSTERECTOMY  ?1987   APPENDECTOMY  ?1987   BIOPSY  03/02/2021   Procedure: BIOPSY;  Surgeon: Lavena Bullion, DO;  Location: [facility];  Service: Gastroenterology;;   BUNIONECTOMY Bilateral [place] Bilateral ?1990   CATARACT EXTRACTION W/ INTRAOCULAR LENS  IMPLANT, BILATERAL Bilateral ?2005   [place]   1990'S   COLONOSCOPY  10/13   Dr. Britta Mccreedy - tubular adenomas and diverticulosis   COLONOSCOPY WITH PROPOFOL N/A 08/28/2021   Procedure: COLONOSCOPY WITH PROPOFOL;  Surgeon: Irving Copas., MD;  Location: [facility];  Service: Gastroenterology;  Laterality: N/A;  ultra slim scope   DILATION AND CURETTAGE OF UTERUS  1970's   "probably 3" (12/09/2012)   ERCP  2008   ESOPHAGEAL DILATION  ` 2012   ESOPHAGEAL MANOMETRY N/A 10/21/2015   Procedure: ESOPHAGEAL MANOMETRY (EM);  Surgeon: Doran Stabler, MD;  [facility]: [facility];  Service: Gastroenterology;  Laterality: N/A;   ESOPHAGEAL MANOMETRY N/A 02/16/2018   Procedure: ESOPHAGEAL MANOMETRY (EM);  Surgeon: Mauri Pole, MD;  [facility];  Service: Endoscopy;  Laterality: N/A;   ESOPHAGOGASTRODUODENOSCOPY Left 03/02/2021   Procedure: ESOPHAGOGASTRODUODENOSCOPY (EGD);  Surgeon: Lavena Bullion, DO;  Location[facility] END[facility];  Service: Gastroenterology;  Laterality: Left;   HUMERUS FRACTURE SURGERY Right 07/19/1969   "horst threw me" (12/09/2012)   INTRAVASCULAR PRESSURE WIRE/FFR STUDY N/A 05/12/2021   Procedure: INTRAVASCULAR PRESSURE WIRE/FFR STUDY;  Surgeon: Nelva Bush, MD;  Location: [facility];  Service: Cardiovascular;  Laterality: N/A;   LEFT HEART CATH AND CORONARY ANGIOGRAPHY N/A 12/29/2017   Procedure: LEFT HEART CATH AND CORONARY ANGIOGRAPHY;  Surgeon: Wellington Hampshire, MD;  Location: [facility];  Service: Cardiovascular;  Laterality: N/A;   LEFT HEART CATH AND CORONARY ANGIOGRAPHY N/A 05/12/2021   Procedure: LEFT HEART CATH AND CORONARY ANGIOGRAPHY;  Surgeon: Nelva Bush, MD;  Location: [facility];  Service: Cardiovascular;  Laterality: N/A;   NASAL SEPTUM SURGERY  1970's   PERCUTANEOUS CORONARY STENT INTERVENTION (PCI-S) N/A 12/09/2012   Procedure: PERCUTANEOUS CORONARY STENT INTERVENTION (PCI-S);  Surgeon: Burnell Blanks, MD;  Location: [facility] CATH LAB;  Service:  Cardiovascular;  Laterality: N/A;   [place] IMPEDANCE STUDY N/A 02/16/2018   Procedure: [place] IMPEDANCE STUDY;  Surgeon: Mauri Pole, MD;  Location[facility];  Service: Endoscopy;  Laterality: N/A;   TOE FUSION Right 10/11   First MTP joint   TOE SURGERY Right 1990's   "toe next to big toe:  dr said I had tumor; cut bone & stuff; another OR dr stretched tendons, etc" (12/09/2012)   TUBAL LIGATION  1985    SOCIAL HISTORY: Social History   Socioeconomic History   Marital status: Married    Spouse name: [name]   Number of children: 2   Years of education: Not on file   Highest education level: Not on file  Occupational History   Occupation: Retired  [employer]  Tobacco Use   Smoking status: Former    Packs/day: 0.80    Years: 15.00    Total pack years: 12.00  Types: Cigarettes    Start date: 07/20/1976    Quit date: 07/20/1993    Years since quitting: 29.1   Smokeless tobacco: Never  Vaping Use   Vaping Use: Never used  Substance and Sexual Activity   Alcohol use: No    Alcohol/week: 0.0 standard drinks of alcohol   Drug use: No   Sexual activity: Yes  Other Topics Concern   Not on file  Social History Narrative   Not on file   Social Determinants of Health   Financial Resource Strain: Not on file  Food Insecurity: Not on file  Transportation Needs: Not on file  Physical Activity: Not on file  Stress: Not on file  Social Connections: Not on file  Intimate Partner Violence: Not on file    FAMILY HISTORY: Family History  Problem Relation Age of Onset   Stroke Mother    Cirrhosis Father    Hypertension Father    Esophageal cancer Sister    Colon polyps Sister        pre-cancerous, part of colon removed   Prostate cancer Brother    Colon cancer Neg Hx    Rectal cancer Neg Hx    Stomach cancer Neg Hx     ALLERGIES:  is allergic to erythromycin base, azithromycin, pollen extract, remeron [mirtazapine], zolpidem tartrate, other, sulfonamide derivatives, and tree  extract.  MEDICATIONS:  Current Outpatient Medications  Medication Sig Dispense Refill   albuterol (PROVENTIL HFA;VENTOLIN HFA) 108 (90 Base) MCG/ACT inhaler Inhale 2 puffs into the lungs every 4 (four) hours as needed for shortness of breath (Asthma).  3   ALPRAZolam (XANAX) 1 MG tablet Take 1 mg by mouth at bedtime.  1   aspirin EC 81 MG tablet Take 81 mg by mouth daily.     atorvastatin (LIPITOR) 40 MG tablet Take 1 tablet (40 mg total) by mouth daily. 90 tablet 3   BIOTIN PO Take 1 tablet by mouth daily.     cetirizine (ZYRTEC) 10 MG tablet Take 10 mg by mouth daily as needed for allergies.     Cholecalciferol (VITAMIN D-3) 125 MCG (5000 UT) TABS Take 10,000 Units by mouth daily.     dicyclomine (BENTYL) 10 MG capsule TAKE 1 CAPSULE (10 MG TOTAL) BY MOUTH 3 (THREE) TIMES DAILY BEFORE MEALS. 270 capsule 1   diltiazem (CARDIZEM CD) 120 MG 24 hr capsule TAKE 1 CAPSULE (120 MG TOTAL) BY MOUTH EVERY MORNING 90 capsule 1   DULoxetine (CYMBALTA) 60 MG capsule Take 60 mg by mouth daily.     HYDROcodone-acetaminophen (NORCO/VICODIN) 5-325 MG tablet Take 1 tablet by mouth 2 (two) times daily.  0   isosorbide mononitrate (IMDUR) 60 MG 24 hr tablet Take 1 tablet (60 mg total) by mouth daily. 90 tablet 3   losartan-hydrochlorothiazide (HYZAAR) 100-25 MG tablet TAKE 1 TABLET BY MOUTH EVERY DAY 90 tablet 3   magnesium oxide (MAG-OX) 400 (240 Mg) MG tablet TAKE 1 TABLET BY MOUTH EVERY DAY 90 tablet 1   metoprolol tartrate (LOPRESSOR) 25 MG tablet TAKE 1 TABLET BY MOUTH TWICE A DAY 180 tablet 3   montelukast (SINGULAIR) 10 MG tablet Take 10 mg by mouth at bedtime.      Multiple Vitamins-Minerals (MULTIVITAMIN WITH MINERALS) tablet Take 1 tablet by mouth daily.     nitroGLYCERIN (NITROSTAT) 0.4 MG SL tablet PLACE 1 TABLET UNDER THE TONGUE EVERY 5 MINUTES X 3 DOSES AS NEEDED (IF NO RELIEF AFTER 3RD DOSE, PROCEED TO THE ED FOR AN EVALUATION  OR CALL 911). 25 tablet 3   NOVOLOG FLEXPEN 100 UNIT/ML FlexPen  Inject 0-30 Units into the skin 3 (three) times daily as needed for high blood sugar (If blood glucose is above 200).     omeprazole (PRILOSEC) 40 MG capsule TAKE 1 CAPSULE BY MOUTH 2 TIMES A DAY 180 capsule 2   ondansetron (ZOFRAN) 4 MG tablet TAKE 1 TABLET BY MOUTH EVERY 8 HOURS AS NEEDED FOR NAUSEA AND VOMITING (Patient taking differently: Taking 1/2 at bedtime) 45 tablet 2   promethazine (PHENERGAN) 25 MG tablet Take 25 mg by mouth every 6 (six) hours as needed for nausea or vomiting.     traZODone (DESYREL) 50 MG tablet Take 150 mg by mouth at bedtime.     TRESIBA FLEXTOUCH 200 UNIT/ML FlexTouch Pen Inject 40 Units into the skin daily. As directed (Patient taking differently: Inject 45 Units into the skin 2 (two) times daily. As directed Between novolog)     No current facility-administered medications for this visit.    REVIEW OF SYSTEMS:   Constitutional: ( - ) fevers, ( - )  chills , ( + ) night sweats Eyes: ( - ) blurriness of vision, ( - ) double vision, ( - ) watery eyes Ears, nose, mouth, throat, and face: ( - ) mucositis, ( - ) sore throat Respiratory: ( - ) cough, ( - ) dyspnea, ( - ) wheezes Cardiovascular: ( - ) palpitation, ( - ) chest discomfort, ( - ) lower extremity swelling Gastrointestinal:  ( + ) nausea, ( - ) heartburn, ( + ) change in bowel habits Skin: ( - ) abnormal skin rashes Lymphatics: ( - ) new lymphadenopathy, ( - ) easy bruising Neurological: ( - ) numbness, ( - ) tingling, ( - ) new weaknesses Behavioral/Psych: ( - ) mood change, ( - ) new changes  All other systems were reviewed with the patient and are negative.  PHYSICAL EXAMINATION: ECOG PERFORMANCE STATUS: 0 - Asymptomatic  Vitals:   08/21/22 1403  BP: (!) 157/56  Pulse: 70  Resp: 15  Temp: 98 F (36.7 C)  SpO2: 97%   Filed Weights   08/21/22 1403  Weight: 166 lb 6.4 oz (75.5 kg)    GENERAL: Well-appearing elderly Caucasian female, alert, no distress and comfortable SKIN: skin color,  texture, turgor are normal, no rashes or significant lesions EYES: conjunctiva are pink and non-injected, sclera clear HEART: regular rate & rhythm and no murmurs and no lower extremity edema Musculoskeletal: no cyanosis of digits and no clubbing  PSYCH: alert & oriented x 3, fluent speech NEURO: no focal motor/sensory deficits  LABORATORY DATA:  I have reviewed the data as listed    Latest Ref Rng & Units 08/21/2022    1:38 PM 05/22/2022   10:26 AM 03/27/2022   10:15 AM  CBC  WBC 4.0 - 10.5 K/uL 13.3  11.3  11.3   Hemoglobin 12.0 - 15.0 g/dL 11.0  11.1  9.8   Hematocrit 36.0 - 46.0 % 31.7  33.3  29.2   Platelets 150 - 400 K/uL 230  203  222        Latest Ref Rng & Units 08/21/2022    1:38 PM 05/22/2022   10:26 AM 03/27/2022   10:15 AM  CMP  Glucose 70 - 99 mg/dL 228  182  254   BUN 8 - 23 mg/dL 8  11  10   $ Creatinine 0.44 - 1.00 mg/dL 0.70  0.72  0.71  Sodium 135 - 145 mmol/L 134  138  134   Potassium 3.5 - 5.1 mmol/L 3.9  3.8  3.8   Chloride 98 - 111 mmol/L 100  101  98   CO2 22 - 32 mmol/L 28  34  31   Calcium 8.9 - 10.3 mg/dL 9.4  9.7  9.7   Total Protein 6.5 - 8.1 g/dL 6.4  6.5  6.5   Total Bilirubin 0.3 - 1.2 mg/dL 0.4  0.4  0.4   Alkaline Phos 38 - 126 U/L 104  83  79   AST 15 - 41 U/L 23  23  21   $ ALT 0 - 44 U/L 19  21  16     $ RADIOGRAPHIC STUDIES: No results found.  ASSESSMENT & PLAN [name] 72 y.o. female with medical history significant for iron deficiency anemia due to GI blood loss and newly diagnosed CLL who presents for a follow up visit.  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 Due to GI intolerance, okay to discontinue --Labs today show worsening anemia with Hgb 11.0. Iron panel pending.  --Recommend another round of IV iron therapy in the event she is found to be  --Continue incorporate iron rich foods  into diet.  --RTC in 12 weeks with labs f/u visit.    #CLL Rai Stage  0: --There is evidence of mild lymphocytosis that has been present since August 2022.  --Today's labs show absolute lymphocyte count of 8000 -- flow cytometry shows the presence of CLL. --Patient does have occasional sweats but I do not believe this is related to her CLL.  Could consider that a B symptoms start treatment if persistent or worsening --Indications for CLL treatment noted below. --No clear indication to start treatment at this time.  Recommend observation.    No orders of the defined types were placed in this encounter.  All questions were answered. The patient knows to call the clinic with any problems, questions or concerns.  A total of more than 30 minutes were spent on this encounter with face-to-face time and non-face-to-face time, including preparing to see the patient, ordering tests and/or medications, counseling the patient and coordination of care as outlined above.   Ledell Peoples, MD Department of Hematology/Oncology [place] [facility] Phone: [phone] Pager: [phone] Email: [email or web address]  09/06/2022 1:16 PM  Kristian Covey BD, Catovsky D, Caligaris-Cappio F, Dighiero G, Dhner H, [place], Padroni, [place] E, Chiorazzi N, [place], Rai KR, [place], Eichhorst B, O'Brien S, Robak T, Seymour JF, Kipps TJ. iwCLL guidelines for diagnosis, indications for treatment, response assessment, and supportive management of CLL. Blood. 2018 Jun 21;131(25):2745-2760.  Active disease should be clearly documented to initiate therapy. At least 1 of the following criteria should be met.  1) Evidence of progressive marrow failure as manifested by the development of, or worsening of, anemia and/or thrombocytopenia. Cutoff levels of Hb <10 g/dL or platelet counts <100  109/L are generally regarded as indication for treatment. However, in some patients, platelet counts <100  109/L may remain stable over a long period;  this situation does not automatically require therapeutic intervention. 2) Massive (ie, ?6 cm below the left costal margin) or progressive or symptomatic splenomegaly. 3) Massive nodes (ie, ?10 cm in longest diameter) or progressive or symptomatic lymphadenopathy. 4) Progressive lymphocytosis with an increase of ?50% over a 64-monthperiod, or lymphocyte doubling  time (LDT) <6 months. LDT can be obtained by linear regression extrapolation of absolute lymphocyte counts obtained at intervals of 2 weeks over an observation period of 2 to 3 months; patients with initial blood lymphocyte counts <30  109/L may require a longer observation period to determine the LDT. Factors contributing to lymphocytosis other than CLL (eg, infections, steroid administration) should be excluded. 5) Autoimmune complications including anemia or thrombocytopenia poorly responsive to corticosteroids. 6) Symptomatic or functional extranodal involvement (eg, skin, kidney, lung, spine). Disease-related symptoms as defined by any of the following: Unintentional weight loss ?10% within the previous 6 months. Significant fatigue (ie, ECOG performance scale 2 or worse; cannot work or unable to perform usual activities). Fevers ?100.22F or 38.0C for 2 or more weeks without evidence of infection. Night sweats for ?1 month without evidence of infection.

## 2022-08-24 ENCOUNTER — Encounter: Payer: Self-pay | Admitting: General Practice

## 2022-08-24 ENCOUNTER — Telehealth: Payer: Self-pay | Admitting: Hematology and Oncology

## 2022-08-24 NOTE — Progress Notes (Signed)
[place] Spiritual Care Note  Referred by [facility]/RN for additional layer of emotional support. Reached [name] by phone, introducing Spiritual Care as part of her support team.  [name] reports limited family support and fatigue and shortness of breath that hamper her follow-through on daily priorities.  At this time she wants to focus her limited energy on completing her taxes, so we plan to follow up by phone in two weeks to plan a more extensive conversation. [name] also has direct Spiritual Care number in case needs arise in the meantime.   [place], [place], [facility] Pager [phone] Voicemail [phone]

## 2022-08-24 NOTE — Telephone Encounter (Signed)
Called patient to schedule per 2/2 los notes. Patient scheduled and notified. Forwarding patient concerns to RN.

## 2022-09-03 ENCOUNTER — Telehealth: Payer: Self-pay | Admitting: Hematology and Oncology

## 2022-09-03 NOTE — Telephone Encounter (Signed)
Called patient per provider PAL. Patient rescheduled and notified. Forwarding patient concerns to RN.

## 2022-09-04 ENCOUNTER — Other Ambulatory Visit: Payer: Self-pay | Admitting: Hematology and Oncology

## 2022-09-06 ENCOUNTER — Telehealth: Payer: Self-pay | Admitting: Pharmacy Technician

## 2022-09-06 ENCOUNTER — Encounter: Payer: Self-pay | Admitting: Physician Assistant

## 2022-09-06 ENCOUNTER — Encounter: Payer: Self-pay | Admitting: Hematology and Oncology

## 2022-09-06 NOTE — Telephone Encounter (Signed)
Dr. Lorenso Courier, Juluis Rainier note:  Auth Submission: APPROVED Payer: [place] Medication & CPT/J Code(s) submitted: Monoferric (Ferrci derisomaltose) [phone] Route of submission (phone, fax, portal):  Phone # Fax # Auth type: Buy/Bill Units/visits requested: 1 Reference number: [id number]  Approval from: 09/04/22 to 02/02/23

## 2022-09-07 ENCOUNTER — Encounter: Payer: Self-pay | Admitting: General Practice

## 2022-09-07 NOTE — Progress Notes (Signed)
[facility] Spiritual Care Note  Left voicemail with encouragement to return call.   [place], [place], [facility] Pager [phone] Voicemail [phone]

## 2022-09-08 ENCOUNTER — Ambulatory Visit (INDEPENDENT_AMBULATORY_CARE_PROVIDER_SITE_OTHER): Payer: Medicare HMO

## 2022-09-08 ENCOUNTER — Encounter: Payer: Self-pay | Admitting: General Practice

## 2022-09-08 VITALS — BP 140/62 | HR 56 | Temp 97.8°F | Resp 18 | Ht 62.0 in | Wt 168.2 lb

## 2022-09-08 DIAGNOSIS — K922 Gastrointestinal hemorrhage, unspecified: Secondary | ICD-10-CM

## 2022-09-08 DIAGNOSIS — D500 Iron deficiency anemia secondary to blood loss (chronic): Secondary | ICD-10-CM

## 2022-09-08 MED ORDER — SODIUM CHLORIDE 0.9 % IV SOLN
1000.0000 mg | Freq: Once | INTRAVENOUS | Status: AC
  Administered 2022-09-08: 1000 mg via INTRAVENOUS
  Filled 2022-09-08: qty 10

## 2022-09-08 NOTE — Patient Instructions (Signed)

## 2022-09-08 NOTE — Progress Notes (Signed)
[place] Note  Connected with Athenea by phone to introduce Spiritual Care as part of her support team. Adding her to [place] mailing list for invitation to monthly virtual meeting, per her request. Also set up in-person appointment when she will next be on [place], directly after labs on Friday, April 19. [place] has direct Spiritual Care number in case needs arise in the meantime.   [employer], [place], [facility] Pager [phone] Voicemail [phone]

## 2022-09-08 NOTE — Progress Notes (Signed)
Diagnosis: Iron Deficiency Anemia  Provider:  Marshell Garfinkel MD  Procedure: Infusion  IV Type: Peripheral, IV Location: R Forearm  Monoferric (Ferric Derisomaltose), Dose: 1000 mg  Infusion Start Time: [id number]  Infusion Stop Time: [id number]  Post Infusion IV Care: Peripheral IV Discontinued  Discharge: Condition: Good, Destination: Home . AVS Provided  Performed by:  Cleophus Molt, RN

## 2022-10-20 ENCOUNTER — Encounter: Payer: Self-pay | Admitting: General Practice

## 2022-10-20 NOTE — Progress Notes (Signed)
[facility] Spiritual Care Note  Reached [name] by phone to reschedule upcoming Spiritual Care appointment to Friday, April 26 at ca 11:45/noon (directly after her appointment with Dr Lorenso Courier).   [place], [place], [facility] Pager [phone] Voicemail [phone]

## 2022-11-06 ENCOUNTER — Other Ambulatory Visit: Payer: Self-pay

## 2022-11-06 ENCOUNTER — Encounter: Payer: Medicare HMO | Admitting: General Practice

## 2022-11-06 ENCOUNTER — Other Ambulatory Visit: Payer: Self-pay | Admitting: *Deleted

## 2022-11-06 ENCOUNTER — Inpatient Hospital Stay: Payer: Medicare HMO | Attending: Physician Assistant

## 2022-11-06 DIAGNOSIS — D500 Iron deficiency anemia secondary to blood loss (chronic): Secondary | ICD-10-CM | POA: Diagnosis present

## 2022-11-06 DIAGNOSIS — Z800 Family history of malignant neoplasm of digestive organs: Secondary | ICD-10-CM | POA: Diagnosis not present

## 2022-11-06 DIAGNOSIS — C9110 Chronic lymphocytic leukemia of B-cell type not having achieved remission: Secondary | ICD-10-CM | POA: Diagnosis present

## 2022-11-06 DIAGNOSIS — K922 Gastrointestinal hemorrhage, unspecified: Secondary | ICD-10-CM | POA: Insufficient documentation

## 2022-11-06 DIAGNOSIS — R110 Nausea: Secondary | ICD-10-CM | POA: Diagnosis not present

## 2022-11-06 DIAGNOSIS — Z87891 Personal history of nicotine dependence: Secondary | ICD-10-CM | POA: Diagnosis not present

## 2022-11-06 LAB — RETIC PANEL
Immature Retic Fract: 21.1 % — ABNORMAL HIGH (ref 2.3–15.9)
RBC.: 3.27 MIL/uL — ABNORMAL LOW (ref 3.87–5.11)
Retic Count, Absolute: 84.4 10*3/uL (ref 19.0–186.0)
Retic Ct Pct: 2.6 % (ref 0.4–3.1)
Reticulocyte Hemoglobin: 25.9 pg — ABNORMAL LOW (ref >27.9)

## 2022-11-06 LAB — CBC WITH DIFFERENTIAL (CANCER CENTER ONLY)
Abs Immature Granulocytes: 0.02 10*3/uL (ref 0.00–0.07)
Basophils Absolute: 0.1 10*3/uL (ref 0.0–0.1)
Basophils Relative: 0 %
Eosinophils Absolute: 0.2 10*3/uL (ref 0.0–0.5)
Eosinophils Relative: 2 %
HCT: 30.9 % — ABNORMAL LOW (ref 36.0–46.0)
Hemoglobin: 10.2 g/dL — ABNORMAL LOW (ref 12.0–15.0)
Immature Granulocytes: 0 %
Lymphocytes Relative: 60 %
Lymphs Abs: 7.2 10*3/uL — ABNORMAL HIGH (ref 0.7–4.0)
MCH: 30.5 pg (ref 26.0–34.0)
MCHC: 33 g/dL (ref 30.0–36.0)
MCV: 92.5 fL (ref 80.0–100.0)
Monocytes Absolute: 0.7 10*3/uL (ref 0.1–1.0)
Monocytes Relative: 6 %
Neutro Abs: 3.9 10*3/uL (ref 1.7–7.7)
Neutrophils Relative %: 32 %
Platelet Count: 226 10*3/uL (ref 150–400)
RBC: 3.34 MIL/uL — ABNORMAL LOW (ref 3.87–5.11)
RDW: 13.9 % (ref 11.5–15.5)
Smear Review: NORMAL
WBC Count: 12 10*3/uL — ABNORMAL HIGH (ref 4.0–10.5)
nRBC: 0 % (ref 0.0–0.2)

## 2022-11-06 LAB — CMP (CANCER CENTER ONLY)
ALT: 17 U/L (ref 0–44)
AST: 22 U/L (ref 15–41)
Albumin: 4 g/dL (ref 3.5–5.0)
Alkaline Phosphatase: 85 U/L (ref 38–126)
Anion gap: 5 (ref 5–15)
BUN: 9 mg/dL (ref 8–23)
CO2: 31 mmol/L (ref 22–32)
Calcium: 9.2 mg/dL (ref 8.9–10.3)
Chloride: 98 mmol/L (ref 98–111)
Creatinine: 0.71 mg/dL (ref 0.44–1.00)
GFR, Estimated: >60 mL/min (ref >60)
Glucose, Bld: 205 mg/dL — ABNORMAL HIGH (ref 70–99)
Potassium: 3.8 mmol/L (ref 3.5–5.1)
Sodium: 134 mmol/L — ABNORMAL LOW (ref 135–145)
Total Bilirubin: 0.4 mg/dL (ref 0.3–1.2)
Total Protein: 6.6 g/dL (ref 6.5–8.1)

## 2022-11-06 LAB — LACTATE DEHYDROGENASE: LDH: 131 U/L (ref 98–192)

## 2022-11-06 LAB — IRON AND IRON BINDING CAPACITY (CC-WL,HP ONLY)
Iron: 62 ug/dL (ref 28–170)
Saturation Ratios: 13 % (ref 10.4–31.8)
TIBC: 483 ug/dL — ABNORMAL HIGH (ref 250–450)
UIBC: 421 ug/dL (ref 148–442)

## 2022-11-06 LAB — FERRITIN: Ferritin: 10 ng/mL — ABNORMAL LOW (ref 11–307)

## 2022-11-10 ENCOUNTER — Encounter: Payer: Self-pay | Admitting: General Practice

## 2022-11-10 NOTE — Progress Notes (Signed)
[facility] Spiritual Care Note  Reached [name] by phone to reschedule Friday's appointment to Tuesday 11/17/2022 at 2pm in Spiritual Care office.   [address] [place], [place], [facility] Pager [phone] Voicemail [phone]

## 2022-11-13 ENCOUNTER — Encounter: Payer: Medicare HMO | Admitting: General Practice

## 2022-11-13 ENCOUNTER — Inpatient Hospital Stay (HOSPITAL_BASED_OUTPATIENT_CLINIC_OR_DEPARTMENT_OTHER): Payer: Medicare HMO | Admitting: Hematology and Oncology

## 2022-11-13 ENCOUNTER — Telehealth: Payer: Self-pay | Admitting: Hematology and Oncology

## 2022-11-13 VITALS — BP 138/53 | HR 65 | Temp 97.2°F | Resp 13 | Wt 167.6 lb

## 2022-11-13 DIAGNOSIS — D500 Iron deficiency anemia secondary to blood loss (chronic): Secondary | ICD-10-CM | POA: Diagnosis not present

## 2022-11-13 DIAGNOSIS — C9110 Chronic lymphocytic leukemia of B-cell type not having achieved remission: Secondary | ICD-10-CM | POA: Diagnosis not present

## 2022-11-13 DIAGNOSIS — K922 Gastrointestinal hemorrhage, unspecified: Secondary | ICD-10-CM | POA: Diagnosis not present

## 2022-11-13 DIAGNOSIS — D72820 Lymphocytosis (symptomatic): Secondary | ICD-10-CM

## 2022-11-13 NOTE — Progress Notes (Signed)
[facility] [phone]   [phone]) [phone]  PROGRESS NOTE  Patient Care Team: Richardean Chimera, MD as PCP - General (Family Medicine) Jonelle Sidle, MD as PCP - Cardiology (Cardiology) Jonelle Sidle, MD as Consulting Physician (Cardiology) Waymon Budge, MD as Consulting Physician (Pulmonary Disease) Dr. 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 Leonides Schanz and Georga Kaufmann 12/24/2021: flow cytometry confirms lymphocytosis is CLL.   Interval History:  [name] 72 y.o. female with medical history significant for iron deficiency anemia due to GI blood loss and CLL who presents for a follow up visit. The patient's last visit was on 08/21/2022. In the interim since the last visit, she completed another round of IV iron infusion.   On exam today [name] reports she easily gets out of breath and cannot walk all that far.  She also reports that her back is hurting as well which makes things more difficult.  She reports that she is also been having some swelling of her feet and tingling of her fingers as well as a trigger finger.  She reports that her bones hurt on occasion as well.  She is having some lightheadedness, dizziness, and "terrible vertigo".  She reports that she does have some blood in the stool from occasional hemorrhoid and sometimes some blood when she blows her nose.  She reports that occasionally her stools do appear to be "coffee ground" but not sticky black or tarry.  She reports that her stomach does not "hurt all the time".  She does have irritable bowel  syndrome which prevents her from taking iron pills.  She denies fevers, chills, shortness of breath, chest pain or cough.  She has no other complaints.  A full 10 point ROS is listed below.  MEDICAL HISTORY:  Past Medical History:  Diagnosis Date   Anxiety    Arthritis    Asthma    Barrett esophagus    Chronic bronchitis (HCC)    Chronic diarrhea    Chronic lower back pain    CLL (chronic lymphocytic leukemia) (HCC) 01/11/2022   Coronary atherosclerosis of native coronary artery    a. s/p DES to LAD in 2014 b. cath in 12/2017 showing patent LAD stent with mild to moderate RCA and LCx disease c. cath in 04/2021 showing patent LAD stent with 60-70% RCA stenosis but not significant by FFR   Diverticulosis    Essential hypertension    Fatty liver    Fibromyalgia    GERD (gastroesophageal reflux disease)    H/O hiatal hernia    IBS (irritable bowel syndrome)    Major depression in partial remission (HCC)    Migraines    Mixed hyperlipidemia    Myocardial infarction (HCC)    2014   OSA on CPAP    Osteoporosis    Pancreatitis 2008   Pneumonia 1990's   Schatzki's ring    Sleep apnea    Type 2 diabetes mellitus (HCC)     SURGICAL HISTORY: Past Surgical History:  Procedure Laterality Date   58 HOUR PH STUDY N/A 02/16/2018   Procedure: 24  HOUR PH STUDY;  Surgeon: Napoleon Form, MD;  Location: [facility];  Service: Endoscopy;  Laterality: N/A;   ABDOMINAL HYSTERECTOMY  ?1987   APPENDECTOMY  ?1987   BIOPSY  03/02/2021   Procedure: BIOPSY;  Surgeon: Shellia Cleverly, DO;  Location: [facility];  Service: Gastroenterology;;   BUNIONECTOMY Bilateral 1972   CARPAL TUNNEL RELEASE Bilateral ?1990   CATARACT EXTRACTION W/ INTRAOCULAR LENS  IMPLANT, BILATERAL Bilateral ?2005   CESAREAN SECTION  1985   CHOLECYSTECTOMY  1990'S   COLONOSCOPY  10/13   Dr. Teena Dunk - tubular adenomas and diverticulosis   COLONOSCOPY WITH PROPOFOL N/A 08/28/2021   Procedure: COLONOSCOPY WITH PROPOFOL;   Surgeon: Lemar Lofty., MD;  Location: [facility] ENDOSCOPY;  Service: Gastroenterology;  Laterality: N/A;  ultra slim scope   CORONARY PRESSURE/FFR STUDY N/A 05/12/2021   Procedure: INTRAVASCULAR PRESSURE WIRE/FFR STUDY;  Surgeon: Yvonne Kendall, MD;  Location: [facility] INVASIVE CV LAB;  Service: Cardiovascular;  Laterality: N/A;   DILATION AND CURETTAGE OF UTERUS  1970's   "probably 3" (12/09/2012)   ERCP  2008   ESOPHAGEAL DILATION  ` 2012   ESOPHAGEAL MANOMETRY N/A 10/21/2015   Procedure: ESOPHAGEAL MANOMETRY (EM);  Surgeon: Sherrilyn Rist, MD;  Location: [facility];  Service: Gastroenterology;  Laterality: N/A;   ESOPHAGEAL MANOMETRY N/A 02/16/2018   Procedure: ESOPHAGEAL MANOMETRY (EM);  Surgeon: Napoleon Form, MD;  Location: [facility];  Service: Endoscopy;  Laterality: N/A;   ESOPHAGOGASTRODUODENOSCOPY Left 03/02/2021   Procedure: ESOPHAGOGASTRODUODENOSCOPY (EGD);  Surgeon: Shellia Cleverly, DO;  Location: [facility] ENDOSCOPY;  Service: Gastroenterology;  Laterality: Left;   HUMERUS FRACTURE SURGERY Right 07/19/1969   "horst threw me" (12/09/2012)   LEFT HEART CATH AND CORONARY ANGIOGRAPHY N/A 12/29/2017   Procedure: LEFT HEART CATH AND CORONARY ANGIOGRAPHY;  Surgeon: Iran Ouch, MD;  [facility]: [facility];  Service: Cardiovascular;  Laterality: N/A;   LEFT HEART CATH AND CORONARY ANGIOGRAPHY N/A 05/12/2021   Procedure: LEFT HEART CATH AND CORONARY ANGIOGRAPHY;  Surgeon: Yvonne Kendall, MD;  Location: [facility];  Service: Cardiovascular;  Laterality: N/A;   NASAL SEPTUM SURGERY  1970's   PERCUTANEOUS CORONARY STENT INTERVENTION (PCI-S) N/A 12/09/2012   Procedure: PERCUTANEOUS CORONARY STENT INTERVENTION (PCI-S);  Surgeon: Kathleene Hazel, MD;  Location[facility] CATH LAB;  Service: Cardiovascular;  Laterality: N/A;   PH IMPEDANCE STUDY N/A 02/16/2018   Procedure: PH IMPEDANCE STUDY;  Surgeon: Napoleon Form, MD;  Location: [facility] ENDOSCOPY;  Service: Endoscopy;   Laterality: N/A;   TOE FUSION Right 10/11   First MTP joint   TOE SURGERY Right 1990's   "toe next to big toe:  dr said I had tumor; cut bone & stuff; another OR dr stretched tendons, etc" (12/09/2012)   TUBAL LIGATION  1985    SOCIAL HISTORY: Social History   Socioeconomic History   Marital status: Married    Spouse name: [name]   Number of children: 2   Years of education: Not on file   Highest education level: Not on file  Occupational History   Occupation: Retired  [employer]  Tobacco Use   Smoking status: Former    Packs/day: 0.80    Years: 15.00    Additional pack years: 0.00    Total pack years: 12.00    Types: Cigarettes    Start date: 07/20/1976    Quit date: 07/20/1993    Years since quitting: 29.3   Smokeless tobacco: Never  Vaping Use   Vaping Use:  Never used  Substance and Sexual Activity   Alcohol use: No    Alcohol/week: 0.0 standard drinks of alcohol   Drug use: No   Sexual activity: Yes  Other Topics Concern   Not on file  Social History Narrative   Not on file   Social Determinants of Health   Financial Resource Strain: Not on file  Food Insecurity: Not on file  Transportation Needs: Not on file  Physical Activity: Not on file  Stress: Not on file  Social Connections: Not on file  Intimate Partner Violence: Not on file    FAMILY HISTORY: Family History  Problem Relation Age of Onset   Stroke Mother    Cirrhosis Father    Hypertension Father    Esophageal cancer Sister    Colon polyps Sister        pre-cancerous, part of colon removed   Prostate cancer Brother    Colon cancer Neg Hx    Rectal cancer Neg Hx    Stomach cancer Neg Hx     ALLERGIES:  is allergic to erythromycin base, azithromycin, pollen extract, remeron [mirtazapine], zolpidem tartrate, other, sulfonamide derivatives, and tree extract.  MEDICATIONS:  Current Outpatient Medications  Medication Sig Dispense Refill   albuterol (PROVENTIL HFA;VENTOLIN HFA) 108 (90 Base) MCG/ACT  inhaler Inhale 2 puffs into the lungs every 4 (four) hours as needed for shortness of breath (Asthma).  3   ALPRAZolam (XANAX) 1 MG tablet Take 1 mg by mouth at bedtime.  1   aspirin EC 81 MG tablet Take 81 mg by mouth daily.     atorvastatin (LIPITOR) 40 MG tablet Take 1 tablet (40 mg total) by mouth daily. 90 tablet 3   BIOTIN PO Take 1 tablet by mouth daily.     cetirizine (ZYRTEC) 10 MG tablet Take 10 mg by mouth daily as needed for allergies.     Cholecalciferol (VITAMIN D-3) 125 MCG (5000 UT) TABS Take 10,000 Units by mouth daily.     dicyclomine (BENTYL) 10 MG capsule TAKE 1 CAPSULE (10 MG TOTAL) BY MOUTH 3 (THREE) TIMES DAILY BEFORE MEALS. 270 capsule 1   diltiazem (CARDIZEM CD) 120 MG 24 hr capsule TAKE 1 CAPSULE (120 MG TOTAL) BY MOUTH EVERY MORNING 90 capsule 1   DULoxetine (CYMBALTA) 60 MG capsule Take 60 mg by mouth daily.     HYDROcodone-acetaminophen (NORCO/VICODIN) 5-325 MG tablet Take 1 tablet by mouth 2 (two) times daily.  0   isosorbide mononitrate (IMDUR) 60 MG 24 hr tablet Take 1 tablet (60 mg total) by mouth daily. 90 tablet 3   losartan-hydrochlorothiazide (HYZAAR) 100-25 MG tablet TAKE 1 TABLET BY MOUTH EVERY DAY 90 tablet 3   magnesium oxide (MAG-OX) 400 (240 Mg) MG tablet TAKE 1 TABLET BY MOUTH EVERY DAY 90 tablet 1   metoprolol tartrate (LOPRESSOR) 25 MG tablet TAKE 1 TABLET BY MOUTH TWICE A DAY 180 tablet 3   montelukast (SINGULAIR) 10 MG tablet Take 10 mg by mouth at bedtime.      Multiple Vitamins-Minerals (MULTIVITAMIN WITH MINERALS) tablet Take 1 tablet by mouth daily.     nitroGLYCERIN (NITROSTAT) 0.4 MG SL tablet PLACE 1 TABLET UNDER THE TONGUE EVERY 5 MINUTES X 3 DOSES AS NEEDED (IF NO RELIEF AFTER 3RD DOSE, PROCEED TO THE ED FOR AN EVALUATION OR CALL 911). 25 tablet 3   NOVOLOG FLEXPEN 100 UNIT/ML FlexPen Inject 0-30 Units into the skin 3 (three) times daily as needed for high blood sugar (If blood glucose is  above 200).     omeprazole (PRILOSEC) 40 MG capsule  TAKE 1 CAPSULE BY MOUTH 2 TIMES A DAY 180 capsule 2   ondansetron (ZOFRAN) 4 MG tablet TAKE 1 TABLET BY MOUTH EVERY 8 HOURS AS NEEDED FOR NAUSEA AND VOMITING (Patient taking differently: Taking 1/2 at bedtime) 45 tablet 2   promethazine (PHENERGAN) 25 MG tablet Take 25 mg by mouth every 6 (six) hours as needed for nausea or vomiting.     traZODone (DESYREL) 50 MG tablet Take 150 mg by mouth at bedtime.     TRESIBA FLEXTOUCH 200 UNIT/ML FlexTouch Pen Inject 40 Units into the skin daily. As directed (Patient taking differently: Inject 45 Units into the skin 2 (two) times daily. As directed Between novolog)     No current facility-administered medications for this visit.    REVIEW OF SYSTEMS:   Constitutional: ( - ) fevers, ( - )  chills , ( + ) night sweats Eyes: ( - ) blurriness of vision, ( - ) double vision, ( - ) watery eyes Ears, nose, mouth, throat, and face: ( - ) mucositis, ( - ) sore throat Respiratory: ( - ) cough, ( - ) dyspnea, ( - ) wheezes Cardiovascular: ( - ) palpitation, ( - ) chest discomfort, ( - ) lower extremity swelling Gastrointestinal:  ( + ) nausea, ( - ) heartburn, ( + ) change in bowel habits Skin: ( - ) abnormal skin rashes Lymphatics: ( - ) new lymphadenopathy, ( - ) easy bruising Neurological: ( - ) numbness, ( - ) tingling, ( - ) new weaknesses Behavioral/Psych: ( - ) mood change, ( - ) new changes  All other systems were reviewed with the patient and are negative.  PHYSICAL EXAMINATION: ECOG PERFORMANCE STATUS: 0 - Asymptomatic  Vitals:   11/13/22 1120  BP: (!) 138/53  Pulse: 65  Resp: 13  Temp: (!) 97.2 F (36.2 C)  SpO2: 97%    Filed Weights   11/13/22 1120  Weight: 167 lb 9.6 oz (76 kg)     GENERAL: Well-appearing elderly Caucasian female, alert, no distress and comfortable SKIN: skin color, texture, turgor are normal, no rashes or significant lesions EYES: conjunctiva are pink and non-injected, sclera clear HEART: regular rate & rhythm  and no murmurs and no lower extremity edema Musculoskeletal: no cyanosis of digits and no clubbing  PSYCH: alert & oriented x 3, fluent speech NEURO: no focal motor/sensory deficits  LABORATORY DATA:  I have reviewed the data as listed    Latest Ref Rng & Units 11/06/2022   12:59 PM 08/21/2022    1:38 PM 05/22/2022   10:26 AM  CBC  WBC 4.0 - 10.5 K/uL 12.0  13.3  11.3   Hemoglobin 12.0 - 15.0 g/dL 16.1  09.6  04.5   Hematocrit 36.0 - 46.0 % 30.9  31.7  33.3   Platelets 150 - 400 K/uL 226  230  203        Latest Ref Rng & Units 11/06/2022   12:59 PM 08/21/2022    1:38 PM 05/22/2022   10:26 AM  CMP  Glucose 70 - 99 mg/dL 409  811  914   BUN 8 - 23 mg/dL 9  8  11    Creatinine 0.44 - 1.00 mg/dL 7.82  9.56  2.13   Sodium 135 - 145 mmol/L 134  134  138   Potassium 3.5 - 5.1 mmol/L 3.8  3.9  3.8   Chloride 98 - 111 mmol/L 98  100  101   CO2 22 - 32 mmol/L 31  28  34   Calcium 8.9 - 10.3 mg/dL 9.2  9.4  9.7   Total Protein 6.5 - 8.1 g/dL 6.6  6.4  6.5   Total Bilirubin 0.3 - 1.2 mg/dL 0.4  0.4  0.4   Alkaline Phos 38 - 126 U/L 85  104  83   AST 15 - 41 U/L 22  23  23    ALT 0 - 44 U/L 17  19  21      RADIOGRAPHIC STUDIES: No results found.  ASSESSMENT & PLAN EMBERLEIGH REILY 72 y.o. female with medical history significant for iron deficiency anemia due to GI blood loss and newly diagnosed CLL who presents for a follow up visit.  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 Due to GI intolerance, okay to discontinue --Labs today show white blood cell 12.0, hemoglobin 10.2, MCV 92.5, and platelets of 226.  Ferritin is 10 and iron saturation 13% with TIBC of 483 --Recommend another round of IV iron therapy in the event she is found to be persistently iron deficient  --Continue incorporate iron rich foods  into diet.  --RTC in 6-8 weeks for a f/u visit    #CLL Rai Stage 0: --There is evidence of mild lymphocytosis that  has been present since August 2022.  --Today's labs show absolute lymphocyte count of 7200 -- flow cytometry shows the presence of CLL. --Patient does have occasional sweats but I do not believe this is related to her CLL.  Could consider that a B symptoms start treatment if persistent or worsening --Indications for CLL treatment noted below. --No clear indication to start treatment at this time.  Recommend observation.    No orders of the defined types were placed in this encounter.  All questions were answered. The patient knows to call the clinic with any problems, questions or concerns.  A total of more than 30 minutes were spent on this encounter with face-to-face time and non-face-to-face time, including preparing to see the patient, ordering tests and/or medications, counseling the patient and coordination of care as outlined above.   Ulysees Barns, MD Department of Hematology/Oncology [facility] Phone: [phone] Pager: [phone] Email: [email or web address]  11/13/2022 5:29 PM  Mathis Fare BD, Catovsky D, Caligaris-Cappio F, Dighiero G, Dhner H, [place], [place], [place] E, Chiorazzi N, [place], Rai KR, [place], Eichhorst B, O'Brien S, Robak T, Seymour JF, Kipps TJ. iwCLL guidelines for diagnosis, indications for treatment, response assessment, and supportive management of CLL. Blood. 2018 Jun 21;131(25):2745-2760.  Active disease should be clearly documented to initiate therapy. At least 1 of the following criteria should be met.  1) Evidence of progressive marrow failure as manifested by the development of, or worsening of, anemia and/or thrombocytopenia. Cutoff levels of Hb <10 g/dL or platelet counts <295  109/L are generally regarded as indication for treatment. However, in some patients, platelet counts <100  109/L may remain stable over a long period; this situation does not automatically require  therapeutic intervention. 2) Massive (ie, ?6 cm below the left costal margin) or progressive or symptomatic splenomegaly. 3) Massive nodes (ie, ?10 cm in longest diameter) or progressive or symptomatic lymphadenopathy. 4) Progressive lymphocytosis with an increase of ?50% over a 27-month period, or lymphocyte doubling time (LDT) <6 months. LDT can be obtained by linear regression extrapolation of  absolute lymphocyte counts obtained at intervals of 2 weeks over an observation period of 2 to 3 months; patients with initial blood lymphocyte counts <30  109/L may require a longer observation period to determine the LDT. Factors contributing to lymphocytosis other than CLL (eg, infections, steroid administration) should be excluded. 5) Autoimmune complications including anemia or thrombocytopenia poorly responsive to corticosteroids. 6) Symptomatic or functional extranodal involvement (eg, skin, kidney, lung, spine). Disease-related symptoms as defined by any of the following: Unintentional weight loss ?10% within the previous 6 months. Significant fatigue (ie, ECOG performance scale 2 or worse; cannot work or unable to perform usual activities). Fevers ?100.40F or 38.0C for 2 or more weeks without evidence of infection. Night sweats for ?1 month without evidence of infection.

## 2022-11-16 ENCOUNTER — Encounter: Payer: Self-pay | Admitting: General Practice

## 2022-11-16 ENCOUNTER — Telehealth: Payer: Self-pay | Admitting: Gastroenterology

## 2022-11-16 ENCOUNTER — Telehealth: Payer: Self-pay | Admitting: Pharmacy Technician

## 2022-11-16 DIAGNOSIS — D500 Iron deficiency anemia secondary to blood loss (chronic): Secondary | ICD-10-CM

## 2022-11-16 DIAGNOSIS — K5520 Angiodysplasia of colon without hemorrhage: Secondary | ICD-10-CM

## 2022-11-16 NOTE — Telephone Encounter (Signed)
Auth Submission: APPROVED Site of care: Site of care: [facility] Payer: [employer] MEDICARE Medication & CPT/J Code(s) submitted: Monoferric (Ferrci derisomaltose) [phone] Route of submission (phone, fax, portal):  Phone # Fax # Auth type: Buy/Bill Units/visits requested: 1 Reference number: [id number] Approval from: 09/04/22 to 03/05/23

## 2022-11-16 NOTE — Progress Notes (Signed)
[facility] Spiritual Care Note  Received and returned call from [facility], requesting to cancel Spiritual Care appointment. She plans to phone to reschedule as soon as important upcoming treatments get scheduled.   [address] [place], [place], [facility] Pager [phone] Voicemail [phone]

## 2022-11-16 NOTE — Telephone Encounter (Signed)
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  Last seen by me in office November 2023, last video capsule study April 2023.   I received a message from this patient's hematologist Dr. Leonides Schanz stating that he is having difficulty keeping her iron levels in the normal range despite frequent doses of IV iron.  This suggest that her chronic occult GI blood loss may be worsening.  Previous video capsule study showed AVMs that were beyond the reach of standard enteroscopy.  Please contact the patient with the above information and tell her that I recommend we arrange a repeat small bowel video capsule study.  With those findings, we can then decide if there is anything found currently amenable to endoscopic discovery and treatment, either here or perhaps at an academic center if a specialty procedure is needed.  -HD

## 2022-11-17 ENCOUNTER — Encounter: Payer: Medicare HMO | Admitting: General Practice

## 2022-11-18 NOTE — Telephone Encounter (Signed)
Called and spoke with patient regarding recommendations. Patient has been scheduled for VCE on 12/02/22 at 8:30 am. Patient is aware that I will send VCE prep instructions via MyChart. Patient verbalized understanding of all information and had no concerns at the end of the call.   Ambulatory referral to GI in Epic. VCE order form completed. VCE instructions sent to patient via MyChart.

## 2022-11-18 NOTE — Addendum Note (Signed)
Addended by: Missy Sabins on: 11/18/2022 10:41 AM   Modules accepted: Orders

## 2022-11-20 ENCOUNTER — Other Ambulatory Visit: Payer: Medicare HMO

## 2022-11-24 ENCOUNTER — Ambulatory Visit (INDEPENDENT_AMBULATORY_CARE_PROVIDER_SITE_OTHER): Payer: Medicare HMO | Admitting: *Deleted

## 2022-11-24 VITALS — BP 129/62 | HR 80 | Temp 98.0°F | Resp 16 | Ht 62.0 in | Wt 170.0 lb

## 2022-11-24 DIAGNOSIS — K922 Gastrointestinal hemorrhage, unspecified: Secondary | ICD-10-CM | POA: Diagnosis not present

## 2022-11-24 DIAGNOSIS — D500 Iron deficiency anemia secondary to blood loss (chronic): Secondary | ICD-10-CM | POA: Diagnosis not present

## 2022-11-24 MED ORDER — SODIUM CHLORIDE 0.9 % IV SOLN
1000.0000 mg | Freq: Once | INTRAVENOUS | Status: AC
  Administered 2022-11-24: 1000 mg via INTRAVENOUS
  Filled 2022-11-24: qty 10

## 2022-11-24 NOTE — Progress Notes (Signed)
Diagnosis: Iron Deficiency Anemia  Provider:  Chilton Greathouse MD  Procedure: IV Infusion  IV Type: Peripheral, IV Location: L Antecubital  Monoferric (Ferric Derisomaltose), Dose: 1000 mg  Infusion Start Time: 1111 am  Infusion Stop Time: 1138 am  Post Infusion IV Care: Observation period completed and Peripheral IV Discontinued  Discharge: Condition: Good, Destination: Home . AVS Provided  Performed by:  Forrest Moron, RN

## 2022-11-25 ENCOUNTER — Telehealth: Payer: Self-pay | Admitting: Gastroenterology

## 2022-11-25 NOTE — Telephone Encounter (Signed)
Patient called requesting a call back to discuss medication management for the Bentyl.

## 2022-11-25 NOTE — Telephone Encounter (Signed)
Returned patient's call letting her know that she did not have to hold Bentyl for her capsule endoscopy.

## 2022-11-27 ENCOUNTER — Ambulatory Visit: Payer: Medicare HMO | Admitting: Hematology and Oncology

## 2022-12-02 ENCOUNTER — Ambulatory Visit (INDEPENDENT_AMBULATORY_CARE_PROVIDER_SITE_OTHER): Payer: Medicare HMO | Admitting: Gastroenterology

## 2022-12-02 DIAGNOSIS — K5521 Angiodysplasia of colon with hemorrhage: Secondary | ICD-10-CM | POA: Diagnosis not present

## 2022-12-02 DIAGNOSIS — D500 Iron deficiency anemia secondary to blood loss (chronic): Secondary | ICD-10-CM | POA: Diagnosis not present

## 2022-12-02 NOTE — Patient Instructions (Signed)
You may have clear liquids beginning at 10:30 am after ingesting the capsule.    You can have a light lunch at 12:30 pm; sandwich and half bowl of soup.  Return to the office at 4 pm to return the equipment.   Return to you normal diet at 5 pm.   Call [phone] and ask for Adilee Lemme BSN RN if you have any questions.  You should pass the capsule in your stool 8-48 hours after ingestion. If you have not passed the capsule, after 72 hours, please contact the office at [phone].    

## 2022-12-02 NOTE — Progress Notes (Signed)
SN: [id number] Exp: 2024-04-10 LOT: [id number]  Patient arrived for VCE. Reported the prep went well. This RN explained capsule dietary restrictions for the next few hours. Pt advised to return at 4 pm to return capsule equipment.  Patient verbalized understanding. Opened capsule, ensured capsule was flashing prior to the patient swallowing the capsule. Patient swallowed capsule without difficulty.  Patient told to call the office with any questions and if capsule has not passed after 72 hours. No further questions by the conclusion of the visit.

## 2022-12-03 NOTE — Progress Notes (Signed)
    Cardiology Office Note  Date: 12/04/2022   ID: CRISTIAN SLAVEY, DOB Nov 29, 1950, MRN [id number]  History of Present Illness: [name] is a 72 y.o. female last seen in October 2023.  She is here with her husband for a follow-up visit.  Reports generalized fatigue as before, also intermittent angina responsive to a single nitroglycerin.  She continues to have chronic anemia undergoing workup by both gastroenterology and hematology.  She just recently had another iron infusion.  Hemoglobin was 10.2 in April.  She states that she had a capsule endoscopy done recently as well, results pending.  We went over her medications, she did cut aspirin back to every other day at last visit.  We talked about stopping it altogether now to see if this makes a difference in her anemia.  Otherwise she reports compliance with her baseline cardiac regimen and is tolerating Lipitor at 40 mg daily.  She will have lab work with PCP in July.  ECG today shows sinus bradycardia.  Her last echocardiogram was in 2022 at which point she had normal LVEF and evidence of mild aortic stenosis.  Physical Exam: VS:  BP 134/60   Pulse (!) 59   Ht 5\' 2"  (1.575 m)   Wt 169 lb 3.2 oz (76.7 kg)   SpO2 96%   BMI 30.95 kg/m , BMI Body mass index is 30.95 kg/m.  Wt Readings from Last 3 Encounters:  12/04/22 169 lb 3.2 oz (76.7 kg)  11/24/22 170 lb (77.1 kg)  11/13/22 167 lb 9.6 oz (76 kg)    General: Patient appears comfortable at rest. HEENT: Conjunctiva and lids normal. Neck: Supple, no elevated JVP or carotid bruits. Lungs: Clear to auscultation, nonlabored breathing at rest. Cardiac: Regular rate and rhythm, no S3, 3/6 systolic murmur. Extremities: No pitting edema.  ECG:  An ECG dated 02/04/2022 was personally reviewed today and demonstrated:  Sinus rhythm.  Labwork: June 2023: Cholesterol 155, triglycerides 88, HDL 54, LDL 84 01/30/2022: Magnesium 1.6 11/06/2022: ALT 17; AST 22; BUN 9; Creatinine 0.71;  Hemoglobin 10.2; Platelet Count 226; Potassium 3.8; Sodium 134   Other Studies Reviewed Today:  No interval cardiac testing for review today.  Assessment and Plan:  1.  CAD status post DES to the LAD in 2014 with moderate mid RCA stenosis that was not hemodynamically significant by RFR evaluation in October 2022.  She has continued on medical therapy.  Plan to stop aspirin completely at this time in light of persistent anemia undergoing workup by both gastroenterology and hematology.  She just recently had a capsule endoscopy with results pending.  Also receiving iron transfusions.  Hemoglobin was 10.2 in April.  ECG reviewed and stable otherwise.  Plan to continue Cardizem CD, Lopressor, Hyzaar, Lipitor, Imdur, and as needed nitroglycerin.  2.  Mixed hyperlipidemia.  She should have repeat lipid panel this summer with PCP.  May need to increase Lipitor to 80 mg daily depending on results.  3.  Essential hypertension.  No change in antihypertensive regimen.  4.  Aortic stenosis, mild by echocardiogram in 2022.  We will plan an updated study.  Disposition:  Follow up  6 months.  Signed, Jonelle Sidle, M.D., F.A.C.C. [facility] at [facility]

## 2022-12-04 ENCOUNTER — Encounter: Payer: Self-pay | Admitting: Cardiology

## 2022-12-04 ENCOUNTER — Ambulatory Visit: Payer: Medicare HMO | Attending: Cardiology | Admitting: Cardiology

## 2022-12-04 VITALS — BP 134/60 | HR 59 | Ht 62.0 in | Wt 169.2 lb

## 2022-12-04 DIAGNOSIS — E782 Mixed hyperlipidemia: Secondary | ICD-10-CM | POA: Diagnosis not present

## 2022-12-04 DIAGNOSIS — I350 Nonrheumatic aortic (valve) stenosis: Secondary | ICD-10-CM

## 2022-12-04 DIAGNOSIS — I10 Essential (primary) hypertension: Secondary | ICD-10-CM

## 2022-12-04 DIAGNOSIS — I25119 Atherosclerotic heart disease of native coronary artery with unspecified angina pectoris: Secondary | ICD-10-CM | POA: Diagnosis not present

## 2022-12-04 MED ORDER — NITROGLYCERIN 0.4 MG SL SUBL: 0.4000 mg | SUBLINGUAL_TABLET | SUBLINGUAL | 3 refills | Status: DC | PRN

## 2022-12-04 NOTE — Patient Instructions (Addendum)
Medication Instructions:  Your physician has recommended you make the following change in your medication:  Stop aspirin Continue other medications the same  Labwork: none  Testing/Procedures: Your physician has requested that you have an echocardiogram. Echocardiography is a painless test that uses sound waves to create images of your heart. It provides your doctor with information about the size and shape of your heart and how well your heart's chambers and valves are working. This procedure takes approximately one hour. There are no restrictions for this procedure. Please do NOT wear cologne, perfume, aftershave, or lotions (deodorant is allowed). Please arrive 15 minutes prior to your appointment time.  Follow-Up: Your physician recommends that you schedule a follow-up appointment in: 6 months  Any Other Special Instructions Will Be Listed Below (If Applicable).  If you need a refill on your cardiac medications before your next appointment, please call your pharmacy.

## 2022-12-17 NOTE — Telephone Encounter (Signed)
[name], [name]'s small bowel video capsule study continues to show AVMs that are beyond the reach of standard enteroscopy. They are not actively bleeding at the time of the study, but they typically cause slow intermittent blood loss.  Since her hematologist (Dr. Leonides Schanz) has had difficulty maintaining her hemoglobin and iron levels despite frequent IV iron treatments, I feel that consideration should be given to an advanced endoscopic procedure that may be able to get through the entire small bowel to find and ablate any AVMs that are encountered.  This must be done through an academic center GI clinic given the specialty nature of the procedure, and I recommend we send a referral to the GI department at [facility].  If she is agreeable, please send the referral for iron deficiency anemia, small bowel AVMs, evaluate for double-balloon enteroscopy.  Meanwhile, she should be closely monitored by hematology and treated with IV iron at their discretion.   - H. Danis  _________________  Hematology team,   Please see above.  -  - Amada Jupiter, MD    [facility]

## 2022-12-18 ENCOUNTER — Encounter: Payer: Self-pay | Admitting: Gastroenterology

## 2022-12-18 NOTE — Telephone Encounter (Signed)
Called and spoke with patient regarding VCE results and recommendations. Patient is aware that we have placed referral to [facility], they will contact her directly to set up her consultation to discuss double-balloon enteroscopy for ablation of AVM's. Pt knows to continue to follow up with Hematology in the interim for monitoring and IV iron. Pt has been advised to contact us if she has not heard from [facility] within 2 weeks. Pt verbalized understanding of all information and had no concerns at the end of the call.   Referral, records, pt's demographic, and insurance information faxed to [facility] (P: [phone], F: [phone]).

## 2022-12-21 ENCOUNTER — Other Ambulatory Visit: Payer: Self-pay | Admitting: Hematology and Oncology

## 2022-12-21 ENCOUNTER — Other Ambulatory Visit: Payer: Self-pay

## 2022-12-21 ENCOUNTER — Inpatient Hospital Stay: Payer: Medicare HMO | Attending: Physician Assistant

## 2022-12-21 DIAGNOSIS — C9110 Chronic lymphocytic leukemia of B-cell type not having achieved remission: Secondary | ICD-10-CM

## 2022-12-21 DIAGNOSIS — D500 Iron deficiency anemia secondary to blood loss (chronic): Secondary | ICD-10-CM

## 2022-12-21 DIAGNOSIS — K2970 Gastritis, unspecified, without bleeding: Secondary | ICD-10-CM | POA: Diagnosis present

## 2022-12-21 DIAGNOSIS — Z79899 Other long term (current) drug therapy: Secondary | ICD-10-CM | POA: Diagnosis not present

## 2022-12-21 DIAGNOSIS — Z87891 Personal history of nicotine dependence: Secondary | ICD-10-CM | POA: Insufficient documentation

## 2022-12-21 LAB — CMP (CANCER CENTER ONLY)
ALT: 32 U/L (ref 0–44)
AST: 31 U/L (ref 15–41)
Albumin: 4.1 g/dL (ref 3.5–5.0)
Alkaline Phosphatase: 100 U/L (ref 38–126)
Anion gap: 6 (ref 5–15)
BUN: 12 mg/dL (ref 8–23)
CO2: 31 mmol/L (ref 22–32)
Calcium: 9.7 mg/dL (ref 8.9–10.3)
Chloride: 96 mmol/L — ABNORMAL LOW (ref 98–111)
Creatinine: 0.8 mg/dL (ref 0.44–1.00)
GFR, Estimated: >60 mL/min (ref >60)
Glucose, Bld: 226 mg/dL — ABNORMAL HIGH (ref 70–99)
Potassium: 3.7 mmol/L (ref 3.5–5.1)
Sodium: 133 mmol/L — ABNORMAL LOW (ref 135–145)
Total Bilirubin: 0.6 mg/dL (ref 0.3–1.2)
Total Protein: 6.8 g/dL (ref 6.5–8.1)

## 2022-12-21 LAB — CBC WITH DIFFERENTIAL (CANCER CENTER ONLY)
Abs Immature Granulocytes: 0.03 10*3/uL (ref 0.00–0.07)
Basophils Absolute: 0.1 10*3/uL (ref 0.0–0.1)
Basophils Relative: 0 %
Eosinophils Absolute: 0.2 10*3/uL (ref 0.0–0.5)
Eosinophils Relative: 2 %
HCT: 35.8 % — ABNORMAL LOW (ref 36.0–46.0)
Hemoglobin: 12.1 g/dL (ref 12.0–15.0)
Immature Granulocytes: 0 %
Lymphocytes Relative: 62 %
Lymphs Abs: 8.4 10*3/uL — ABNORMAL HIGH (ref 0.7–4.0)
MCH: 31.1 pg (ref 26.0–34.0)
MCHC: 33.8 g/dL (ref 30.0–36.0)
MCV: 92 fL (ref 80.0–100.0)
Monocytes Absolute: 0.7 10*3/uL (ref 0.1–1.0)
Monocytes Relative: 5 %
Neutro Abs: 4.1 10*3/uL (ref 1.7–7.7)
Neutrophils Relative %: 31 %
Platelet Count: 188 10*3/uL (ref 150–400)
RBC: 3.89 MIL/uL (ref 3.87–5.11)
RDW: 17.6 % — ABNORMAL HIGH (ref 11.5–15.5)
Smear Review: NORMAL
WBC Count: 13.6 10*3/uL — ABNORMAL HIGH (ref 4.0–10.5)
nRBC: 0 % (ref 0.0–0.2)

## 2022-12-21 LAB — RETIC PANEL
Immature Retic Fract: 9 % (ref 2.3–15.9)
RBC.: 3.98 MIL/uL (ref 3.87–5.11)
Retic Count, Absolute: 110.2 10*3/uL (ref 19.0–186.0)
Retic Ct Pct: 2.8 % (ref 0.4–3.1)
Reticulocyte Hemoglobin: 35.6 pg (ref >27.9)

## 2022-12-21 LAB — IRON AND IRON BINDING CAPACITY (CC-WL,HP ONLY)
Iron: 137 ug/dL (ref 28–170)
Saturation Ratios: 37 % — ABNORMAL HIGH (ref 10.4–31.8)
TIBC: 372 ug/dL (ref 250–450)
UIBC: 235 ug/dL (ref 148–442)

## 2022-12-21 LAB — FERRITIN: Ferritin: 131 ng/mL (ref 11–307)

## 2022-12-25 ENCOUNTER — Other Ambulatory Visit: Payer: Self-pay | Admitting: Cardiology

## 2022-12-27 NOTE — Progress Notes (Unsigned)
[facility] [phone]   [phone]  PROGRESS NOTE  Patient Care Team: Richardean Chimera, MD as PCP - General (Family Medicine) Jonelle Sidle, MD as PCP - Cardiology (Cardiology) Jonelle Sidle, MD as Consulting Physician (Cardiology) Waymon Budge, MD as Consulting Physician (Pulmonary Disease) Dr. 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 Leonides Schanz and Georga Kaufmann 12/24/2021: flow cytometry confirms lymphocytosis is CLL.   Interval History:  [name] 73 y.o. female with medical history significant for iron deficiency anemia due to GI blood loss and CLL who presents for a follow up visit. The patient's last visit was on 11/06/2022. In the interim since the last visit, she completed another round of IV iron infusion on 11/24/2022.   On exam today [name] reports she has been well overall in the interim since her last visit.  She reports that she switched from Zyrtec to [facility] and is now using biotin for her hair.  She reports her last IV iron infusion on 11/24/2022 went well.  She also notes she is doing her best to try to increase red meat consumption.  She reports that she eats red meat approximate 3 times a week at this time.  She notes her energy levels are improving.  She reports that she can also continue to take her vitamin B12 daily as well as she stopped her baby aspirin.  She has not had any overt signs of bleeding.  She notes that Dr. Myrtie Neither referred her to [facility] gastroenterology for consideration of a push  enteroscopy.  This is currently being scheduled.  She denies fevers, chills, shortness of breath, chest pain or cough.  She has no other complaints.  A full 10 point ROS is listed below.  MEDICAL HISTORY:  Past Medical History:  Diagnosis Date   Anxiety    Arthritis    Asthma    Barrett esophagus    Chronic bronchitis (HCC)    Chronic diarrhea    Chronic lower back pain    CLL (chronic lymphocytic leukemia) (HCC) 01/11/2022   Coronary atherosclerosis of native coronary artery    a. s/p DES to LAD in 2014 b. cath in 12/2017 showing patent LAD stent with mild to moderate RCA and LCx disease c. cath in 04/2021 showing patent LAD stent with 60-70% RCA stenosis but not significant by FFR   Diverticulosis    Essential hypertension    Fatty liver    Fibromyalgia    GERD (gastroesophageal reflux disease)    H/O hiatal hernia    IBS (irritable bowel syndrome)    Major depression in partial remission (HCC)    Migraines    Mixed hyperlipidemia    Myocardial infarction (HCC)    2014   OSA on CPAP    Osteoporosis    Pancreatitis 2008   Pneumonia 1990's   Schatzki's ring    Sleep apnea    Type 2 diabetes mellitus (HCC)     SURGICAL HISTORY: Past Surgical History:  Procedure Laterality Date   49 HOUR PH STUDY N/A 02/16/2018  Procedure: 24 HOUR PH STUDY;  Surgeon: Napoleon Form, MD;  Location: [facility] ENDOSCOPY;  Service: Endoscopy;  Laterality: N/A;   ABDOMINAL HYSTERECTOMY  ?1987   APPENDECTOMY  ?1987   BIOPSY  03/02/2021   Procedure: BIOPSY;  Surgeon: Shellia Cleverly, DO;  Location: [facility] ENDOSCOPY;  Service: Gastroenterology;;   BUNIONECTOMY Bilateral 1972   CARPAL TUNNEL RELEASE Bilateral ?1990   CATARACT EXTRACTION W/ INTRAOCULAR LENS  IMPLANT, BILATERAL Bilateral ?2005   CESAREAN SECTION  1985   CHOLECYSTECTOMY  1990'S   COLONOSCOPY  10/13   Dr. Teena Dunk - tubular adenomas and diverticulosis   COLONOSCOPY WITH PROPOFOL N/A 08/28/2021   Procedure: COLONOSCOPY WITH PROPOFOL;   Surgeon: Lemar Lofty., MD;  Location: [facility] ENDOSCOPY;  Service: Gastroenterology;  Laterality: N/A;  ultra slim scope   CORONARY PRESSURE/FFR STUDY N/A 05/12/2021   Procedure: INTRAVASCULAR PRESSURE WIRE/FFR STUDY;  Surgeon: Yvonne Kendall, MD;  Location: [facility] INVASIVE CV LAB;  Service: Cardiovascular;  Laterality: N/A;   DILATION AND CURETTAGE OF UTERUS  1970's   "probably 3" (12/09/2012)   ERCP  2008   ESOPHAGEAL DILATION  ` 2012   ESOPHAGEAL MANOMETRY N/A 10/21/2015   Procedure: ESOPHAGEAL MANOMETRY (EM);  Surgeon: Sherrilyn Rist, MD;  Location: [facility] ENDOSCOPY;  Service: Gastroenterology;  Laterality: N/A;   ESOPHAGEAL MANOMETRY N/A 02/16/2018   Procedure: ESOPHAGEAL MANOMETRY (EM);  Surgeon: Napoleon Form, MD;  Location: [facility] ENDOS[facility];  Service: Endoscopy;  Laterality: N/A;   ESOPHAGOGASTRODUODENOSCOPY Left 03/02/2021   Procedure: ESOPHAGOGASTRODUODENOSCOPY (EGD);  Surgeon: Shellia Cleverly, DO;  Location: [facility] ENDOSCOPY;  Service: Gastroenterology;  Laterality: Left;   HUMERUS FRACTURE SURGERY Right 07/19/1969   "horst threw me" (12/09/2012)   LEFT HEART CATH AND CORONARY ANGIOGRAPHY N/A 12/29/2017   Procedure: LEFT HEART CATH AND CORONARY ANGIOGRAPHY;  Surgeon: Iran Ouch, MD;  Location: [facility]VAS[facility] CV LAB;  Service: Cardiovascular;  Laterality: N/A;   LEFT HEART CATH AND CORONARY ANGIOGRAPHY N/A 05/12/2021   Procedure: LEFT HEART CATH AND CORONARY ANGIOGRAPHY;  Surgeon: Yvonne Kendall, MD;  Location: [facility]VASIVE CV LAB;  Service: Cardiovascular;  Laterality: N/A;   NASAL SEPTUM SURGERY  1970's   PERCUTANEOUS CORONARY STENT INTERVENTION (PCI-S) N/A 12/09/2012   Procedure: PERCUTANEOUS CORONARY STENT INTERVENTION (PCI-S);  Surgeon: Kathleene Hazel, MD;  Location: [facility] CATH LAB;  Service: Cardiovascular;  Laterality: N/A;   PH IMPEDANCE STUDY N/A 02/16/2018   Procedure: PH IMPEDANCE STUDY;  Surgeon: Napoleon Form, MD;  Location: [facility] ENDOSCOPY;  Service: Endoscopy;   Laterality: N/A;   TOE FUSION Right 10/11   First MTP joint   TOE SURGERY Right 1990's   "toe next to big toe:  dr said I had tumor; cut bone & stuff; another OR dr stretched tendons, etc" (12/09/2012)   TUBAL LIGATION  1985    SOCIAL HISTORY: Social History   Socioeconomic History   Marital status: Married    Spouse name: [name]   Number of children: 2   Years of education: Not on file   Highest education level: Not on file  Occupational History   Occupation: Retired  [employer]  Tobacco Use   Smoking status: Former    Packs/day: 0.80    Years: 15.00    Additional pack years: 0.00    Total pack years: 12.00    Types: Cigarettes    Start date: 07/20/1976    Quit date: 07/20/1993    Years since quitting: 29.4   Smokeless tobacco: Never  Vaping Use  Vaping Use: Never used  Substance and Sexual Activity   Alcohol use: No    Alcohol/week: 0.0 standard drinks of alcohol   Drug use: No   Sexual activity: Yes  Other Topics Concern   Not on file  Social History Narrative   Not on file   Social Determinants of Health   Financial Resource Strain: Not on file  Food Insecurity: Not on file  Transportation Needs: Not on file  Physical Activity: Not on file  Stress: Not on file  Social Connections: Not on file  Intimate Partner Violence: Not on file    FAMILY HISTORY: Family History  Problem Relation Age of Onset   Stroke Mother    Cirrhosis Father    Hypertension Father    Esophageal cancer Sister    Colon polyps Sister        pre-cancerous, part of colon removed   Prostate cancer Brother    Colon cancer Neg Hx    Rectal cancer Neg Hx    Stomach cancer Neg Hx     ALLERGIES:  is allergic to erythromycin base, azithromycin, pollen extract, remeron [mirtazapine], zolpidem tartrate, other, sulfonamide derivatives, and tree extract.  MEDICATIONS:  Current Outpatient Medications  Medication Sig Dispense Refill   albuterol (PROVENTIL HFA;VENTOLIN HFA) 108 (90 Base) MCG/ACT  inhaler Inhale 2 puffs into the lungs every 4 (four) hours as needed for shortness of breath (Asthma).  3   ALPRAZolam (XANAX) 1 MG tablet Take 1 mg by mouth at bedtime.  1   atorvastatin (LIPITOR) 40 MG tablet Take 1 tablet (40 mg total) by mouth daily. 90 tablet 3   BIOTIN PO Take 1 tablet by mouth daily.     cetirizine (ZYRTEC) 10 MG tablet Take 10 mg by mouth daily as needed for allergies.     Cholecalciferol (VITAMIN D-3) 125 MCG (5000 UT) TABS Take 10,000 Units by mouth daily.     dicyclomine (BENTYL) 10 MG capsule TAKE 1 CAPSULE (10 MG TOTAL) BY MOUTH 3 (THREE) TIMES DAILY BEFORE MEALS. 270 capsule 1   diltiazem (CARDIZEM CD) 120 MG 24 hr capsule TAKE 1 CAPSULE (120 MG TOTAL) BY MOUTH EVERY MORNING 90 capsule 1   DULoxetine (CYMBALTA) 60 MG capsule Take 60 mg by mouth daily.     HYDROcodone-acetaminophen (NORCO/VICODIN) 5-325 MG tablet Take 1 tablet by mouth 2 (two) times daily.  0   isosorbide mononitrate (IMDUR) 60 MG 24 hr tablet Take 1 tablet (60 mg total) by mouth daily. 90 tablet 3   losartan-hydrochlorothiazide (HYZAAR) 100-25 MG tablet TAKE 1 TABLET BY MOUTH EVERY DAY 90 tablet 3   metoprolol tartrate (LOPRESSOR) 25 MG tablet TAKE 1 TABLET BY MOUTH TWICE A DAY 180 tablet 3   montelukast (SINGULAIR) 10 MG tablet Take 10 mg by mouth at bedtime.      Multiple Vitamins-Minerals (MULTIVITAMIN WITH MINERALS) tablet Take 1 tablet by mouth daily.     nitroGLYCERIN (NITROSTAT) 0.4 MG SL tablet Place 1 tablet (0.4 mg total) under the tongue every 5 (five) minutes x 3 doses as needed for chest pain (if no relief after 3rd dose, proceed to ED or call 911). 25 tablet 3   NOVOLOG FLEXPEN 100 UNIT/ML FlexPen Inject 0-30 Units into the skin 3 (three) times daily as needed for high blood sugar (If blood glucose is above 200).     omeprazole (PRILOSEC) 40 MG capsule TAKE 1 CAPSULE BY MOUTH 2 TIMES A DAY 180 capsule 2   ondansetron (ZOFRAN) 4 MG tablet  TAKE 1 TABLET BY MOUTH EVERY 8 HOURS AS NEEDED FOR  NAUSEA AND VOMITING (Patient taking differently: Taking 1/2 at bedtime) 45 tablet 2   ONETOUCH VERIO test strip 1 each 3 (three) times daily.     promethazine (PHENERGAN) 25 MG tablet Take 25 mg by mouth every 6 (six) hours as needed for nausea or vomiting.     traZODone (DESYREL) 50 MG tablet Take 150 mg by mouth at bedtime.     TRESIBA FLEXTOUCH 200 UNIT/ML FlexTouch Pen Inject 40 Units into the skin daily. As directed (Patient taking differently: Inject 45 Units into the skin 2 (two) times daily. As directed Between novolog)     No current facility-administered medications for this visit.    REVIEW OF SYSTEMS:   Constitutional: ( - ) fevers, ( - )  chills , ( + ) night sweats Eyes: ( - ) blurriness of vision, ( - ) double vision, ( - ) watery eyes Ears, nose, mouth, throat, and face: ( - ) mucositis, ( - ) sore throat Respiratory: ( - ) cough, ( - ) dyspnea, ( - ) wheezes Cardiovascular: ( - ) palpitation, ( - ) chest discomfort, ( - ) lower extremity swelling Gastrointestinal:  ( + ) nausea, ( - ) heartburn, ( + ) change in bowel habits Skin: ( - ) abnormal skin rashes Lymphatics: ( - ) new lymphadenopathy, ( - ) easy bruising Neurological: ( - ) numbness, ( - ) tingling, ( - ) new weaknesses Behavioral/Psych: ( - ) mood change, ( - ) new changes  All other systems were reviewed with the patient and are negative.  PHYSICAL EXAMINATION: ECOG PERFORMANCE STATUS: 0 - Asymptomatic  Vitals:   12/28/22 1013  BP: 128/75  Pulse: (!) 57  Resp: 16  Temp: 98.3 F (36.8 C)  SpO2: 99%     Filed Weights   12/28/22 1013  Weight: 166 lb 4.8 oz (75.4 kg)      GENERAL: Well-appearing elderly Caucasian female, alert, no distress and comfortable SKIN: skin color, texture, turgor are normal, no rashes or significant lesions EYES: conjunctiva are pink and non-injected, sclera clear HEART: regular rate & rhythm and no murmurs and no lower extremity edema Musculoskeletal: no cyanosis of  digits and no clubbing  PSYCH: alert & oriented x 3, fluent speech NEURO: no focal motor/sensory deficits  LABORATORY DATA:  I have reviewed the data as listed    Latest Ref Rng & Units 12/21/2022   10:43 AM 11/06/2022   12:59 PM 08/21/2022    1:38 PM  CBC  WBC 4.0 - 10.5 K/uL 13.6  12.0  13.3   Hemoglobin 12.0 - 15.0 g/dL 96.2  95.2  84.1   Hematocrit 36.0 - 46.0 % 35.8  30.9  31.7   Platelets 150 - 400 K/uL 188  226  230        Latest Ref Rng & Units 12/21/2022   10:43 AM 11/06/2022   12:59 PM 08/21/2022    1:38 PM  CMP  Glucose 70 - 99 mg/dL 324  401  027   BUN 8 - 23 mg/dL 12  9  8    Creatinine 0.44 - 1.00 mg/dL 2.53  6.64  4.03   Sodium 135 - 145 mmol/L 133  134  134   Potassium 3.5 - 5.1 mmol/L 3.7  3.8  3.9   Chloride 98 - 111 mmol/L 96  98  100   CO2 22 - 32 mmol/L 31  31  28   Calcium 8.9 - 10.3 mg/dL 9.7  9.2  9.4   Total Protein 6.5 - 8.1 g/dL 6.8  6.6  6.4   Total Bilirubin 0.3 - 1.2 mg/dL 0.6  0.4  0.4   Alkaline Phos 38 - 126 U/L 100  85  104   AST 15 - 41 U/L 31  22  23    ALT 0 - 44 U/L 32  17  19     RADIOGRAPHIC STUDIES: No results found.  ASSESSMENT & PLAN DAYELI RAGONESE 72 y.o. female with medical history significant for iron deficiency anemia due to GI blood loss and newly diagnosed CLL who presents for a follow up visit.  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 Due to GI intolerance, okay to discontinue --Labs today show white blood cell 13.6, 12.1, MCV 92, and platelets of 188.  Ferritin is 131 with a saturation ratio 37% --Recommend another round of IV iron therapy in the event she is found to be persistently iron deficient.  No evidence of iron deficiency at this time. --Continue incorporate iron rich foods  into diet.  --RTC for CLL as noted below.    #CLL Rai Stage 0: --There is evidence of mild lymphocytosis that has been present since August 2022.  --Today's labs show  absolute lymphocyte count of 8400 -- flow cytometry shows the presence of CLL. --Patient does have occasional sweats but I do not believe this is related to her CLL.  Could consider that a B symptoms start treatment if persistent or worsening --Indications for CLL treatment noted below. --No clear indication to start treatment at this time.  Recommend observation.   --RTC in 6 months time.   No orders of the defined types were placed in this encounter.  All questions were answered. The patient knows to call the clinic with any problems, questions or concerns.  A total of more than 30 minutes were spent on this encounter with face-to-face time and non-face-to-face time, including preparing to see the patient, ordering tests and/or medications, counseling the patient and coordination of care as outlined above.   Ulysees Barns, MD Department of Hematology/Oncology [facility] at [facility] Phone: [phone] Pager: [phone] Email: [email or web address]  12/29/2022 3:43 PM  Mathis Fare BD, Catovsky D, Caligaris-Cappio F, Dighiero G, Dhner H, [place], Dasher, [place] E, Chiorazzi N, [place], Rai KR, [place], Eichhorst B, O'Brien S, Robak T, Seymour JF, Kipps TJ. iwCLL guidelines for diagnosis, indications for treatment, response assessment, and supportive management of CLL. Blood. 2018 Jun 21;131(25):2745-2760.  Active disease should be clearly documented to initiate therapy. At least 1 of the following criteria should be met.  1) Evidence of progressive marrow failure as manifested by the development of, or worsening of, anemia and/or thrombocytopenia. Cutoff levels of Hb <10 g/dL or platelet counts <657  109/L are generally regarded as indication for treatment. However, in some patients, platelet counts <100  109/L may remain stable over a long period; this situation does not automatically require therapeutic intervention. 2) Massive  (ie, ?6 cm below the left costal margin) or progressive or symptomatic splenomegaly. 3) Massive nodes (ie, ?10 cm in longest diameter) or progressive or symptomatic lymphadenopathy. 4) Progressive lymphocytosis with an increase of ?50% over a 19-month period, or lymphocyte doubling time (LDT) <6 months. LDT can be obtained by linear regression extrapolation of absolute lymphocyte counts obtained at intervals  of 2 weeks over an observation period of 2 to 3 months; patients with initial blood lymphocyte counts <30  109/L may require a longer observation period to determine the LDT. Factors contributing to lymphocytosis other than CLL (eg, infections, steroid administration) should be excluded. 5) Autoimmune complications including anemia or thrombocytopenia poorly responsive to corticosteroids. 6) Symptomatic or functional extranodal involvement (eg, skin, kidney, lung, spine). Disease-related symptoms as defined by any of the following: Unintentional weight loss ?10% within the previous 6 months. Significant fatigue (ie, ECOG performance scale 2 or worse; cannot work or unable to perform usual activities). Fevers ?100.37F or 38.0C for 2 or more weeks without evidence of infection. Night sweats for ?1 month without evidence of infection.

## 2022-12-28 ENCOUNTER — Inpatient Hospital Stay: Payer: Medicare HMO | Admitting: Hematology and Oncology

## 2022-12-28 ENCOUNTER — Other Ambulatory Visit: Payer: Self-pay

## 2022-12-28 VITALS — BP 128/75 | HR 57 | Temp 98.3°F | Resp 16 | Wt 166.3 lb

## 2022-12-28 DIAGNOSIS — D500 Iron deficiency anemia secondary to blood loss (chronic): Secondary | ICD-10-CM | POA: Diagnosis not present

## 2022-12-28 DIAGNOSIS — C9110 Chronic lymphocytic leukemia of B-cell type not having achieved remission: Secondary | ICD-10-CM

## 2022-12-28 DIAGNOSIS — D649 Anemia, unspecified: Secondary | ICD-10-CM | POA: Diagnosis not present

## 2022-12-28 DIAGNOSIS — D72820 Lymphocytosis (symptomatic): Secondary | ICD-10-CM

## 2022-12-29 ENCOUNTER — Encounter: Payer: Self-pay | Admitting: Physician Assistant

## 2022-12-29 ENCOUNTER — Encounter: Payer: Self-pay | Admitting: Hematology and Oncology

## 2023-01-05 ENCOUNTER — Other Ambulatory Visit: Payer: Self-pay | Admitting: Gastroenterology

## 2023-01-05 DIAGNOSIS — F331 Major depressive disorder, recurrent, moderate: Secondary | ICD-10-CM

## 2023-01-05 NOTE — Telephone Encounter (Signed)
No, I do not manage this patient's trazodone.  - HD

## 2023-01-14 ENCOUNTER — Other Ambulatory Visit: Payer: Self-pay | Admitting: Gastroenterology

## 2023-01-22 ENCOUNTER — Ambulatory Visit (HOSPITAL_COMMUNITY)
Admission: RE | Admit: 2023-01-22 | Discharge: 2023-01-22 | Disposition: A | Discharge status: Home / Self Care | Payer: Medicare HMO | Source: Ambulatory Visit | Attending: Cardiology | Admitting: Cardiology

## 2023-01-22 DIAGNOSIS — I350 Nonrheumatic aortic (valve) stenosis: Secondary | ICD-10-CM

## 2023-01-22 DIAGNOSIS — I5030 Unspecified diastolic (congestive) heart failure: Secondary | ICD-10-CM | POA: Diagnosis not present

## 2023-01-22 DIAGNOSIS — I080 Rheumatic disorders of both mitral and aortic valves: Secondary | ICD-10-CM

## 2023-01-22 DIAGNOSIS — I517 Cardiomegaly: Secondary | ICD-10-CM

## 2023-01-22 LAB — ECHOCARDIOGRAM COMPLETE
AR max vel: 2.87 cm2
AV Area VTI: 2.88 cm2
AV Area mean vel: 2.86 cm2
AV Mean grad: 5.5 mmHg
AV Peak grad: 9.9 mmHg
Ao pk vel: 1.58 m/s
Area-P 1/2: 3.37 cm2
MV VTI: 2.17 cm2
S' Lateral: 2.7 cm

## 2023-01-22 NOTE — Progress Notes (Signed)
  Echocardiogram 2D Echocardiogram has been performed.  [name] 01/22/2023, 10:16 AM

## 2023-02-02 ENCOUNTER — Telehealth: Payer: Self-pay | Admitting: Cardiology

## 2023-02-02 MED ORDER — ATORVASTATIN CALCIUM 80 MG PO TABS: 80.0000 mg | ORAL_TABLET | Freq: Every day | ORAL | 3 refills | Status: DC

## 2023-02-02 NOTE — Telephone Encounter (Signed)
 Patient informed and verbalized understanding of plan. 

## 2023-02-02 NOTE — Telephone Encounter (Signed)
-----   Message from Nona Dell sent at 01/28/2023 10:47 AM EDT ----- Results reviewed.  Recent lab work from PCP indicates LDL 97.  If she has been tolerating Lipitor at 40 mg daily would suggest increasing to 80 mg daily to get LDL closer to goal with vascular disease.

## 2023-02-02 NOTE — Telephone Encounter (Signed)
 Pt returning call for lab results  

## 2023-02-12 ENCOUNTER — Other Ambulatory Visit: Payer: Self-pay | Admitting: Gastroenterology

## 2023-02-12 ENCOUNTER — Other Ambulatory Visit: Payer: Self-pay | Admitting: Cardiology

## 2023-03-13 ENCOUNTER — Other Ambulatory Visit: Payer: Self-pay | Admitting: Cardiology

## 2023-03-25 ENCOUNTER — Other Ambulatory Visit: Payer: Self-pay | Admitting: Cardiology

## 2023-03-26 ENCOUNTER — Telehealth: Payer: Self-pay

## 2023-03-26 NOTE — Telephone Encounter (Signed)
This pt called regarding a lab appt for Monday 03/29/2023. Pt stated that she currently has a UTI and wanted to let staff know due to concerns of it messing up her labs and asked if she should still come in for her scheduled appt time. This RN informed patient that sometimes certain labs can read a little different when you are sick or have an infection but to still please come into clinic/lab if she is feeling well enough. Pt verbalized understanding.

## 2023-03-29 ENCOUNTER — Other Ambulatory Visit: Payer: Medicare HMO

## 2023-03-29 ENCOUNTER — Telehealth: Payer: Self-pay | Admitting: *Deleted

## 2023-03-29 ENCOUNTER — Telehealth: Payer: Self-pay | Admitting: Hematology and Oncology

## 2023-03-29 NOTE — Telephone Encounter (Signed)
Received vm message from pt stating that she is unable to come in for her lab appt today due to illness. She is asking to have it rescheduled.

## 2023-04-09 ENCOUNTER — Other Ambulatory Visit: Payer: Self-pay | Admitting: Cardiology

## 2023-04-12 ENCOUNTER — Inpatient Hospital Stay: Payer: Medicare HMO | Attending: Hematology and Oncology

## 2023-04-12 ENCOUNTER — Other Ambulatory Visit: Payer: Self-pay | Admitting: Hematology and Oncology

## 2023-04-12 DIAGNOSIS — D500 Iron deficiency anemia secondary to blood loss (chronic): Secondary | ICD-10-CM | POA: Insufficient documentation

## 2023-04-12 DIAGNOSIS — C9110 Chronic lymphocytic leukemia of B-cell type not having achieved remission: Secondary | ICD-10-CM | POA: Insufficient documentation

## 2023-04-12 LAB — CMP (CANCER CENTER ONLY)
ALT: 27 U/L (ref 0–44)
AST: 29 U/L (ref 15–41)
Albumin: 3.8 g/dL (ref 3.5–5.0)
Alkaline Phosphatase: 122 U/L (ref 38–126)
Anion gap: 3 — ABNORMAL LOW (ref 5–15)
BUN: 11 mg/dL (ref 8–23)
CO2: 33 mmol/L — ABNORMAL HIGH (ref 22–32)
Calcium: 9.7 mg/dL (ref 8.9–10.3)
Chloride: 103 mmol/L (ref 98–111)
Creatinine: 0.72 mg/dL (ref 0.44–1.00)
GFR, Estimated: >60 mL/min (ref >60)
Glucose, Bld: 87 mg/dL (ref 70–99)
Potassium: 3.6 mmol/L (ref 3.5–5.1)
Sodium: 139 mmol/L (ref 135–145)
Total Bilirubin: 0.7 mg/dL (ref 0.3–1.2)
Total Protein: 6.5 g/dL (ref 6.5–8.1)

## 2023-04-12 LAB — CBC WITH DIFFERENTIAL (CANCER CENTER ONLY)
Abs Immature Granulocytes: 0.04 10*3/uL (ref 0.00–0.07)
Basophils Absolute: 0.1 10*3/uL (ref 0.0–0.1)
Basophils Relative: 1 %
Eosinophils Absolute: 0.3 10*3/uL (ref 0.0–0.5)
Eosinophils Relative: 2 %
HCT: 34.8 % — ABNORMAL LOW (ref 36.0–46.0)
Hemoglobin: 11.7 g/dL — ABNORMAL LOW (ref 12.0–15.0)
Immature Granulocytes: 0 %
Lymphocytes Relative: 66 %
Lymphs Abs: 11.7 10*3/uL — ABNORMAL HIGH (ref 0.7–4.0)
MCH: 34 pg (ref 26.0–34.0)
MCHC: 33.6 g/dL (ref 30.0–36.0)
MCV: 101.2 fL — ABNORMAL HIGH (ref 80.0–100.0)
Monocytes Absolute: 1 10*3/uL (ref 0.1–1.0)
Monocytes Relative: 6 %
Neutro Abs: 4.4 10*3/uL (ref 1.7–7.7)
Neutrophils Relative %: 25 %
Platelet Count: 256 10*3/uL (ref 150–400)
RBC: 3.44 MIL/uL — ABNORMAL LOW (ref 3.87–5.11)
RDW: 13 % (ref 11.5–15.5)
Smear Review: NORMAL
WBC Count: 17.5 10*3/uL — ABNORMAL HIGH (ref 4.0–10.5)
nRBC: 0 % (ref 0.0–0.2)

## 2023-04-12 LAB — RETIC PANEL
Immature Retic Fract: 18.2 % — ABNORMAL HIGH (ref 2.3–15.9)
RBC.: 3.47 MIL/uL — ABNORMAL LOW (ref 3.87–5.11)
Retic Count, Absolute: 147.1 10*3/uL (ref 19.0–186.0)
Retic Ct Pct: 4.2 % — ABNORMAL HIGH (ref 0.4–3.1)
Reticulocyte Hemoglobin: 36.9 pg (ref >27.9)

## 2023-04-12 LAB — IRON AND IRON BINDING CAPACITY (CC-WL,HP ONLY)
Iron: 75 ug/dL (ref 28–170)
Saturation Ratios: 20 % (ref 10.4–31.8)
TIBC: 378 ug/dL (ref 250–450)
UIBC: 303 ug/dL (ref 148–442)

## 2023-04-12 LAB — FERRITIN: Ferritin: 71 ng/mL (ref 11–307)

## 2023-04-24 ENCOUNTER — Other Ambulatory Visit: Payer: Self-pay | Admitting: Student

## 2023-05-10 ENCOUNTER — Telehealth: Payer: Self-pay | Admitting: *Deleted

## 2023-05-10 NOTE — Telephone Encounter (Signed)
Received call from pt regarding lab results from September.  Reviewed lab results with her. Advised no serious changes in her labs. Advised that we will be rechecking them in December. Pt voiced understanding.

## 2023-06-12 ENCOUNTER — Telehealth: Payer: Self-pay | Admitting: Hematology and Oncology

## 2023-06-14 ENCOUNTER — Ambulatory Visit: Payer: Medicare HMO | Attending: Cardiology | Admitting: Cardiology

## 2023-06-14 ENCOUNTER — Encounter: Payer: Self-pay | Admitting: Cardiology

## 2023-06-14 VITALS — BP 118/58 | HR 59 | Ht 62.0 in | Wt 161.2 lb

## 2023-06-14 DIAGNOSIS — I10 Essential (primary) hypertension: Secondary | ICD-10-CM

## 2023-06-14 DIAGNOSIS — I358 Other nonrheumatic aortic valve disorders: Secondary | ICD-10-CM

## 2023-06-14 DIAGNOSIS — E782 Mixed hyperlipidemia: Secondary | ICD-10-CM | POA: Diagnosis not present

## 2023-06-14 DIAGNOSIS — I25119 Atherosclerotic heart disease of native coronary artery with unspecified angina pectoris: Secondary | ICD-10-CM

## 2023-06-14 NOTE — Patient Instructions (Addendum)

## 2023-06-14 NOTE — Progress Notes (Signed)
Cardiology Office Note  Date: 06/14/2023   ID: [name], DOB Nov 21, 1950, MRN [id number]  History of Present Illness: [name] is a 72 y.o. female last seen in May.  She is here with her husband for a follow-up visit.  Reports stable angina and nitroglycerin use, stable dyspnea on exertion.  She reports compliance with her medications.  We stopped aspirin at the last visit.  I reviewed her medications.  Current cardiac regimen includes Lipitor, Cardizem CD, Imdur, Hyzaar, Lopressor, and as needed nitroglycerin.  I reviewed her recent lab work.  Hemoglobin has come up to 12.4.  LDL 69 and HDL 51.  Physical Exam: VS:  BP (!) 118/58   Pulse (!) 59   Ht 5\' 2"  (1.575 m)   Wt 161 lb 3.2 oz (73.1 kg)   SpO2 95%   BMI 29.48 kg/m , BMI Body mass index is 29.48 kg/m.  Wt Readings from Last 3 Encounters:  06/14/23 161 lb 3.2 oz (73.1 kg)  12/28/22 166 lb 4.8 oz (75.4 kg)  12/04/22 169 lb 3.2 oz (76.7 kg)    General: Patient appears comfortable at rest. HEENT: Conjunctiva and lids normal. Neck: Supple, no elevated JVP or carotid bruits. Lungs: Clear to auscultation, nonlabored breathing at rest. Cardiac: Regular rate and rhythm, no S3, 2/6 systolic murmur. Extremities: No pitting edema.  ECG:  An ECG dated 12/04/2022 was personally reviewed today and demonstrated:  Sinus bradycardia.  Labwork: 04/12/2023: ALT 27; AST 29; BUN 11; Creatinine 0.72; Hemoglobin 11.7; Platelet Count 256; Potassium 3.6; Sodium 139  November 2024: Hemoglobin 12.4, platelets 195, potassium 3.6, BUN 11, creatinine 0.95, AST 18, ALT 17, TSH 2.45, hemoglobin A1c 8.4%, cholesterol 146, triglycerides 128, HDL 51, LDL 69  Other Studies Reviewed Today:  Echocardiogram 01/22/2023:  1. Left ventricular ejection fraction, by estimation, is 65 to 70%. The  left ventricle has normal function. The left ventricle has no regional  wall motion abnormalities. There is moderate concentric left ventricular   hypertrophy. Left ventricular  diastolic parameters are consistent with Grade II diastolic dysfunction  (pseudonormalization).   2. Right ventricular systolic function is normal. The right ventricular  size is normal. Tricuspid regurgitation signal is inadequate for assessing  PA pressure.   3. The mitral valve is grossly normal. Trivial mitral valve  regurgitation.   4. The aortic valve is tricuspid. Aortic valve regurgitation is not  visualized. No aortic stenosis is present. Aortic valve mean gradient  measures 5.5 mmHg.   5. The inferior vena cava is dilated in size with >50% respiratory  variability, suggesting right atrial pressure of 8 mmHg.   Assessment and Plan:  1.  CAD status post DES to the LAD in 2014 with moderate mid RCA stenosis that was not hemodynamically significant by RFR evaluation in October 2022.  LVEF 65 to 70% by echocardiogram in July of this year.  She reports stable angina and nitroglycerin use.  No longer on aspirin at this time as discussed in the prior note.  Continue Lopressor, Lipitor, Cardizem CD, Imdur, and as needed nitroglycerin.   2.  Mixed hyperlipidemia.  LDL 69 and HDL 51 in November of this year.  She continues on Lipitor 80 mg daily.   3.  Primary hypertension.  Blood pressure well-controlled today.  No changes to current regimen.   4.  Aortic valve sclerosis without definite stenosis by recent follow-up echocardiogram in July of this year.  Mean AV gradient approximately 6 mmHg.Marland Kitchen  Disposition:  Follow  up  6 months.  Signed, Jonelle Sidle, M.D., F.A.C.C. [facility]

## 2023-06-16 ENCOUNTER — Other Ambulatory Visit: Payer: Self-pay | Admitting: Nurse Practitioner

## 2023-06-16 DIAGNOSIS — C9110 Chronic lymphocytic leukemia of B-cell type not having achieved remission: Secondary | ICD-10-CM

## 2023-06-21 ENCOUNTER — Inpatient Hospital Stay: Payer: Medicare HMO | Attending: Hematology and Oncology

## 2023-06-21 DIAGNOSIS — D500 Iron deficiency anemia secondary to blood loss (chronic): Secondary | ICD-10-CM | POA: Insufficient documentation

## 2023-06-21 DIAGNOSIS — K922 Gastrointestinal hemorrhage, unspecified: Secondary | ICD-10-CM | POA: Insufficient documentation

## 2023-06-21 DIAGNOSIS — C9110 Chronic lymphocytic leukemia of B-cell type not having achieved remission: Secondary | ICD-10-CM | POA: Insufficient documentation

## 2023-06-21 LAB — RETIC PANEL
Immature Retic Fract: 12.9 % (ref 2.3–15.9)
RBC.: 3.87 MIL/uL (ref 3.87–5.11)
Retic Count, Absolute: 67.3 10*3/uL (ref 19.0–186.0)
Retic Ct Pct: 1.7 % (ref 0.4–3.1)
Reticulocyte Hemoglobin: 33.2 pg (ref >27.9)

## 2023-06-21 LAB — CBC WITH DIFFERENTIAL/PLATELET
Abs Immature Granulocytes: 0.02 10*3/uL (ref 0.00–0.07)
Basophils Absolute: 0.1 10*3/uL (ref 0.0–0.1)
Basophils Relative: 1 %
Eosinophils Absolute: 0.4 10*3/uL (ref 0.0–0.5)
Eosinophils Relative: 2 %
HCT: 37.9 % (ref 36.0–46.0)
Hemoglobin: 12.9 g/dL (ref 12.0–15.0)
Immature Granulocytes: 0 %
Lymphocytes Relative: 68 %
Lymphs Abs: 11.9 10*3/uL — ABNORMAL HIGH (ref 0.7–4.0)
MCH: 32.5 pg (ref 26.0–34.0)
MCHC: 34 g/dL (ref 30.0–36.0)
MCV: 95.5 fL (ref 80.0–100.0)
Monocytes Absolute: 0.9 10*3/uL (ref 0.1–1.0)
Monocytes Relative: 5 %
Neutro Abs: 4.2 10*3/uL (ref 1.7–7.7)
Neutrophils Relative %: 24 %
Platelets: 229 10*3/uL (ref 150–400)
RBC: 3.97 MIL/uL (ref 3.87–5.11)
RDW: 11.9 % (ref 11.5–15.5)
Smear Review: NORMAL
WBC: 17.5 10*3/uL — ABNORMAL HIGH (ref 4.0–10.5)
nRBC: 0 % (ref 0.0–0.2)

## 2023-06-21 LAB — IRON AND IRON BINDING CAPACITY (CC-WL,HP ONLY)
Iron: 73 ug/dL (ref 28–170)
Saturation Ratios: 14 % (ref 10.4–31.8)
TIBC: 518 ug/dL — ABNORMAL HIGH (ref 250–450)
UIBC: 445 ug/dL — ABNORMAL HIGH (ref 148–442)

## 2023-06-21 LAB — CMP (CANCER CENTER ONLY)
ALT: 16 U/L (ref 0–44)
AST: 18 U/L (ref 15–41)
Albumin: 4.1 g/dL (ref 3.5–5.0)
Alkaline Phosphatase: 107 U/L (ref 38–126)
Anion gap: 6 (ref 5–15)
BUN: 12 mg/dL (ref 8–23)
CO2: 30 mmol/L (ref 22–32)
Calcium: 9.8 mg/dL (ref 8.9–10.3)
Chloride: 99 mmol/L (ref 98–111)
Creatinine: 0.79 mg/dL (ref 0.44–1.00)
GFR, Estimated: >60 mL/min (ref >60)
Glucose, Bld: 231 mg/dL — ABNORMAL HIGH (ref 70–99)
Potassium: 3.4 mmol/L — ABNORMAL LOW (ref 3.5–5.1)
Sodium: 135 mmol/L (ref 135–145)
Total Bilirubin: 0.6 mg/dL (ref <1.2)
Total Protein: 7 g/dL (ref 6.5–8.1)

## 2023-06-21 LAB — FERRITIN: Ferritin: 9 ng/mL — ABNORMAL LOW (ref 11–307)

## 2023-06-21 NOTE — Progress Notes (Signed)
Waiting for ferritin results. Phone visit 07/01/2023

## 2023-06-22 ENCOUNTER — Telehealth: Payer: Self-pay | Admitting: Gastroenterology

## 2023-06-22 NOTE — Telephone Encounter (Signed)
Patient called requested to speak with a nurse said she is having a flare up of IBS.

## 2023-06-22 NOTE — Telephone Encounter (Signed)
Pt states that she has been having 2-3 weeks of abdominal cramping associated with loose stools, lower back pain and headaches. No fever, some nausea. Pt wanted to also  notify Dr. Myrtie Neither that in regard to the referral to [facility] that the Dr called the pt and notified her that she was not a good candidate due to being a high risk.  Please review and advise

## 2023-06-22 NOTE — Progress Notes (Signed)
Patient with known history of CLL. Significant iron deficiency without anemia. Discuss 07/01/2023 with the patient. IV iron will be recommended.

## 2023-06-23 NOTE — Telephone Encounter (Signed)
Inbound call from patient, following up on conversation below. Patient requesting to speak to [facility]. Please advise.

## 2023-06-23 NOTE — Telephone Encounter (Signed)
Thank you for the note.  Longstanding IBS that has been predominantly constipation but with episodic diarrhea.  Previous trial of dicyclomine has not helped. Previous course of metronidazole for possible SIBO was of uncertain benefit.  Yes, I am aware of the telemedicine consult with Dr. 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  Difficult to say why her IBS seems to have flared up lately or if there is something or else going on with the diarrhea.  No history of C. difficile infection, but we should rule that out with stool studies:  C. difficile PCR, GDH antigen and toxin  If that is negative, I may give her another course of metronidazole for possible SIBO.  She is also due for clinic follow-up with me for this and her underlying cirrhosis.  Please arrange that.  Ellwood Dense MD

## 2023-06-24 ENCOUNTER — Other Ambulatory Visit: Payer: Self-pay | Admitting: Gastroenterology

## 2023-06-25 ENCOUNTER — Other Ambulatory Visit: Payer: Self-pay

## 2023-06-25 DIAGNOSIS — R109 Unspecified abdominal pain: Secondary | ICD-10-CM

## 2023-06-25 DIAGNOSIS — R197 Diarrhea, unspecified: Secondary | ICD-10-CM

## 2023-06-25 NOTE — Telephone Encounter (Signed)
Pt made aware of Dr. Myrtie Neither recommendations. Orders for labs placed in Epic. Pt made aware. Pt given location to lab. Pt was notified that I would contact her on 06/29/2023 to schedule her for 07/06/2023 at 10:20 AM with Dr. Myrtie Neither. 7 day hold Pt verbalized understanding with all questions answered.

## 2023-06-28 ENCOUNTER — Ambulatory Visit: Payer: Medicare HMO | Admitting: Hematology and Oncology

## 2023-06-29 ENCOUNTER — Other Ambulatory Visit: Payer: Medicare HMO

## 2023-06-29 DIAGNOSIS — R197 Diarrhea, unspecified: Secondary | ICD-10-CM

## 2023-06-29 DIAGNOSIS — R109 Unspecified abdominal pain: Secondary | ICD-10-CM

## 2023-06-29 NOTE — Telephone Encounter (Signed)
Pt was scheduled to see Dr. Myrtie Neither on 07/06/2023 at 10:20 AM: Pt made aware.  Pt verbalized understanding with all questions answered.

## 2023-06-30 LAB — CLOSTRIDIUM DIFFICILE BY PCR: Toxigenic C. Difficile by PCR: POSITIVE — AB

## 2023-06-30 NOTE — Assessment & Plan Note (Signed)
Previously on ferrous sulfate 325 mg daily. Due to GI intolerance, okay to discontinue --Labs today show white blood cell count 17.5; hemoglobin 12.9, MCV 95.5, and platelets of 229.  Ferritin is 9 with a saturation ratio 14% --Recommend another round of IV iron therapy in the event she is found to be persistently iron deficient.  No evidence of iron deficiency at this time. --Continue incorporate iron rich foods  into diet.  --RTC for CLL as noted below.

## 2023-06-30 NOTE — Assessment & Plan Note (Signed)
CLL Rai Stage 0: --There is evidence of mild lymphocytosis that has been present since August 2022.  --Today's labs show absolute lymphocyte count of 8400 -- flow cytometry shows the presence of CLL. --Patient does have occasional sweats but I do not believe this is related to her CLL.  Could consider that a B symptoms start treatment if persistent or worsening --Indications for CLL treatment noted below. --No clear indication to start treatment at this time.  Recommend observation.   --RTC in 6 months time.

## 2023-06-30 NOTE — Progress Notes (Unsigned)
I connected with [name] on 07/01/23 at 10:30 AM EST by telephone and verified that I am speaking with the correct person using two identifiers.   I discussed the limitations, risks, security and privacy concerns of performing an evaluation and management service by telemedicine and the availability of in-person appointments. I also discussed with the patient that there may be a patient responsible charge related to this service. The patient expressed understanding and agreed to proceed.   Other persons participating in the visit and their role in the encounter: n/a   Patient's location: home  Provider's location: [facility]    Chief Complaint: CLL and iron deficiency anemia    Patient Care Team: Richardean Chimera, MD as PCP - General (Family Medicine) Jonelle Sidle, MD as PCP - Cardiology (Cardiology) Jonelle Sidle, MD as Consulting Physician (Cardiology) Waymon Budge, MD as Consulting Physician (Pulmonary Disease) Dr. 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  --Today's labs show absolute lymphocyte count of 8400 -- flow cytometry shows the presence of CLL. --Patient does have occasional sweats but I do not believe this is related to her CLL.  Could consider that a B symptoms start treatment if persistent or worsening --Indications for CLL treatment noted below. --No clear indication to start treatment at this time.  Recommend observation.   --RTC in 6 months time.   Iron deficiency anemia due to chronic blood loss Previously on ferrous sulfate 325 mg daily. Due to GI intolerance, okay to discontinue --Labs today show white blood cell count 17.5; hemoglobin 12.9, MCV 95.5, and platelets of 229.  Ferritin is 9 with a saturation  ratio 14% --Recommend another round of IV iron therapy in the event she is found to be persistently iron deficient.  No evidence of iron deficiency at this time. --Continue incorporate iron rich foods  into diet.  --RTC for CLL as noted below.    Plan: Labs reviewed  -CBC showing WBC 17.5; Hgb 12.9; Hct 37.9; MCV 95.5; plt 229; Anc 4.2 -CMP - K 3.4; glucose 231; BUN 12; Creatinine 0.79; eGFR > 60; Ca 9.8; LFTs normal.  -Ferritin 9; iron 73; TIBC 518; sat ratio 14; absolute reticulocyte 67.3 -Stool samples from patient's GI provider were positive for C. difficile.  Results only available today.  Will start vancomycin 250 mg 4 times daily for next 10 days.  Reach out to her GI provider due to results and ensure follow-up. -- Patient would benefit from IV iron.  Will get this set up at [facility] infusion center after effective treatment for C. difficile. -- Follow-up via phone visit in 7 to 10 days and revisit referral to [facility] infusion center for iron infusions.  The patient is agreeable with this plan.  The patient understands the plans discussed today and is in agreement with them.  She knows to contact our office if she develops concerns prior to her next appointment.  I provided 15 minutes of face-to-face time during this encounter and > 50% was spent counseling as documented under my assessment and plan.    Phyllis Jews, NP  [facility] - A DEPT OF [facility] [address] [place]  [place] [address] Dept: [phone] Dept Fax: [phone]   No orders of the defined types were placed in this encounter.     CHIEF COMPLAINT:  CC: CLL and iron deficiency anemia due to GI blood loss  Current Treatment: Surveillance.  IV iron when needed.  INTERVAL HISTORY:  [name] is here today for repeat clinical assessment.  She was last seen by Dr. Leonides Schanz on 12/28/2022.  Labs do indicate that she is iron deficient,  with ferritin of 9. When reviewing labs with her, her C. Diff is positive. Stool sample had been ordered by her GI provider, Dr . Myrtie Neither. She is having vomiting, intestinal cramping, back pain and significant diarrhea. She is tired with a bad headache. I started treatment with vancomycin 125 mg QID for 10 days. She stated she is not eating or drinking very much and that this has been gradually getting worse for the past month. She states that mild symptoms started in September, when she had been on antibiotics for UTI. She denies fevers or chills. She denies pain. Her appetite is poor.   I have reviewed the past medical history, past surgical history, social history and family history with the patient and they are unchanged from previous note.  ALLERGIES:  is allergic to erythromycin base, azithromycin, pollen extract, remeron [mirtazapine], zolpidem tartrate, other, sulfonamide derivatives, and tree extract.  MEDICATIONS:  Current Outpatient Medications  Medication Sig Dispense Refill   vancomycin (VANCOCIN) 250 MG capsule Take 1 capsule (250 mg total) by mouth 4 (four) times daily. 40 capsule 0   albuterol (PROVENTIL HFA;VENTOLIN HFA) 108 (90 Base) MCG/ACT inhaler Inhale 2 puffs into the lungs every 4 (four) hours as needed for shortness of breath (Asthma).  3   ALPRAZolam (XANAX) 1 MG tablet Take 1 mg by mouth at bedtime.  1   atorvastatin (LIPITOR) 80 MG tablet Take 1 tablet (80 mg total) by mouth daily. 90 tablet 3   BIOTIN PO Take 1 tablet by mouth daily.     cetirizine (ZYRTEC) 10 MG tablet Take 10 mg by mouth daily as needed for allergies.     Cholecalciferol (VITAMIN D-3) 125 MCG (5000 UT) TABS Take 10,000 Units by mouth daily.     dicyclomine (BENTYL) 10 MG capsule TAKE 1 CAPSULE (10 MG TOTAL) BY MOUTH 3 (THREE) TIMES DAILY BEFORE MEALS. 270 capsule 1   diltiazem (CARDIZEM CD) 120 MG 24 hr capsule TAKE 1 CAPSULE (120 MG TOTAL) BY MOUTH EVERY MORNING 90 capsule 1   DULoxetine (CYMBALTA) 60  MG capsule Take 60 mg by mouth daily.     HYDROcodone-acetaminophen (NORCO/VICODIN) 5-325 MG tablet Take 1 tablet by mouth 2 (two) times daily.  0   isosorbide mononitrate (IMDUR) 60 MG 24 hr tablet TAKE 1 TABLET BY MOUTH EVERY DAY 90 tablet 3   losartan-hydrochlorothiazide (HYZAAR) 100-25 MG tablet TAKE 1 TABLET BY MOUTH EVERY DAY 90 tablet 3   metoprolol tartrate (LOPRESSOR) 25 MG tablet TAKE 1 TABLET BY MOUTH TWICE A DAY 180 tablet 3   MISC NATURAL PRODUCTS PO Take 1 tablet by mouth daily at 6 (six) AM. Renew Liver     montelukast (SINGULAIR) 10 MG tablet Take 10 mg by mouth at bedtime.      nitroGLYCERIN (NITROSTAT) 0.4 MG SL tablet Place 1 tablet (0.4 mg total) under the tongue every 5 (five) minutes x 3 doses as needed for chest pain (if no relief after 3rd dose, proceed to ED or call 911). 25 tablet 3   NOVOLOG  FLEXPEN 100 UNIT/ML FlexPen Inject 0-30 Units into the skin 3 (three) times daily as needed for high blood sugar (If blood glucose is above 200).     omeprazole (PRILOSEC) 40 MG capsule TAKE 1 CAPSULE BY MOUTH TWICE A DAY 180 capsule 0   ondansetron (ZOFRAN) 4 MG tablet TAKE 1 TABLET BY MOUTH EVERY 8 HOURS AS NEEDED FOR NAUSEA AND VOMITING (Patient taking differently: Taking 1/2 at bedtime) 45 tablet 2   ONETOUCH VERIO test strip 1 each 3 (three) times daily.     promethazine (PHENERGAN) 25 MG tablet Take 25 mg by mouth every 6 (six) hours as needed for nausea or vomiting.     traZODone (DESYREL) 50 MG tablet Take 150 mg by mouth at bedtime.     TRESIBA FLEXTOUCH 200 UNIT/ML FlexTouch Pen Inject 40 Units into the skin daily. As directed (Patient taking differently: Inject 45 Units into the skin 2 (two) times daily. As directed Between novolog)     No current facility-administered medications for this visit.    HISTORY OF PRESENT ILLNESS:   Oncology History  CLL (chronic lymphocytic leukemia) (HCC)  01/02/2022 Cancer Staging   Staging form: Chronic Lymphocytic Leukemia / Small  Lymphocytic Lymphoma, AJCC 8th Edition - Clinical stage from 01/02/2022: Modified Rai Stage 0 (Modified Rai risk: Low, Lymphocytosis: Present, Adenopathy: Absent, Organomegaly: Absent, Anemia: Absent, Thrombocytopenia: Absent) - Signed by Jaci Standard, MD on 01/11/2022 Stage prefix: Initial diagnosis   01/11/2022 Initial Diagnosis   CLL (chronic lymphocytic leukemia) (HCC)       REVIEW OF SYSTEMS:   Constitutional: Denies fevers, chills or abnormal weight loss.  Patient reporting fatigue and generalized bodyaches. Eyes: Denies blurriness of vision Ears, nose, mouth, throat, and face: Denies mucositis or sore throat Respiratory: Denies cough, dyspnea or wheezes Cardiovascular: Denies palpitation, chest discomfort or lower extremity swelling Gastrointestinal: Patient reports nausea with a few episodes of vomiting, abdominal pain and cramping, and diarrhea.  This is gradually worsened over the last month. Skin: Denies abnormal skin rashes Lymphatics: Denies new lymphadenopathy or easy bruising Neurological:Denies numbness, tingling or new weaknesses.  She does report moderate headache. Behavioral/Psych: Mood is stable, no new changes  All other systems were reviewed with the patient and are negative.   VITALS:  There were no vitals taken for this visit.  Wt Readings from Last 3 Encounters:  06/14/23 161 lb 3.2 oz (73.1 kg)  12/28/22 166 lb 4.8 oz (75.4 kg)  12/04/22 169 lb 3.2 oz (76.7 kg)    There is no height or weight on file to calculate BMI.  Performance status (ECOG): 2 - Symptomatic, <50% confined to bed  LABORATORY DATA:  I have reviewed the data as listed    Component Value Date/Time   NA 135 06/21/2023 1021   K 3.4 (L) 06/21/2023 1021   CL 99 06/21/2023 1021   CO2 30 06/21/2023 1021   GLUCOSE 231 (H) 06/21/2023 1021   BUN 12 06/21/2023 1021   CREATININE 0.79 06/21/2023 1021   CALCIUM 9.8 06/21/2023 1021   PROT 7.0 06/21/2023 1021   ALBUMIN 4.1 06/21/2023 1021    AST 18 06/21/2023 1021   ALT 16 06/21/2023 1021   ALKPHOS 107 06/21/2023 1021   BILITOT 0.6 06/21/2023 1021   GFRNONAA >60 06/21/2023 1021   GFRAA >60 12/29/2017 0258    Lab Results  Component Value Date   WBC 17.5 (H) 06/21/2023   NEUTROABS 4.2 06/21/2023   HGB 12.9 06/21/2023   HCT 37.9 06/21/2023  MCV 95.5 06/21/2023   PLT 229 06/21/2023

## 2023-07-01 ENCOUNTER — Encounter: Payer: Self-pay | Admitting: Nurse Practitioner

## 2023-07-01 ENCOUNTER — Other Ambulatory Visit: Payer: Self-pay

## 2023-07-01 ENCOUNTER — Inpatient Hospital Stay: Payer: Medicare HMO | Admitting: Nurse Practitioner

## 2023-07-01 DIAGNOSIS — C9110 Chronic lymphocytic leukemia of B-cell type not having achieved remission: Secondary | ICD-10-CM | POA: Diagnosis not present

## 2023-07-01 DIAGNOSIS — D500 Iron deficiency anemia secondary to blood loss (chronic): Secondary | ICD-10-CM

## 2023-07-01 LAB — C. DIFFICILE GDH AND TOXIN A/B
GDH ANTIGEN: DETECTED
MICRO NUMBER:: 15831636
SPECIMEN QUALITY:: ADEQUATE
TOXIN A AND B: NOT DETECTED

## 2023-07-01 LAB — CLOSTRIDIUM DIFFICILE TOXIN B, QUALITATIVE, REAL-TIME PCR: Toxigenic C. Difficile by PCR: DETECTED — AB

## 2023-07-01 MED ORDER — VANCOMYCIN HCL 250 MG PO CAPS: 250.0000 mg | ORAL_CAPSULE | Freq: Four times a day (QID) | ORAL | 0 refills | Status: DC

## 2023-07-05 ENCOUNTER — Telehealth: Payer: Self-pay | Admitting: Hematology and Oncology

## 2023-07-06 ENCOUNTER — Ambulatory Visit: Payer: Medicare HMO | Admitting: Gastroenterology

## 2023-07-22 NOTE — Assessment & Plan Note (Addendum)
 Previously on ferrous sulfate  325 mg daily. Due to GI intolerance, okay to discontinue --Labs from 06/21/2023 show white blood cell count 17.5; hemoglobin 12.9, MCV 95.5, and platelets of 229.  Ferritin is 9 with a saturation ratio 14%. --Recommend another round of IV iron  therapy.  This was put on hold during telephone visit on 07/01/2023 due to patient's new diagnosis of C. difficile.  She has completed 1 round vancomycin  250 mg 4 times daily for 10 days. -Will proceed with orders for IV Venofer  200 mg weekly for 5 doses.  Recheck labs in approximately 8 weeks and follow-up via phone visit to determine need for further treatment. --Has been unable to tolerate oral iron  due to GI problems.  She is seeing gastroenterologist. --Continue incorporate iron  rich foods  into diet.  --RTC for CLL as noted below.

## 2023-07-22 NOTE — Assessment & Plan Note (Signed)
 CLL Rai Stage 0: --There is evidence of mild lymphocytosis that has been present since August 2022.  --Today's labs show absolute lymphocyte count of 8400 -- flow cytometry shows the presence of CLL. --Patient does have occasional sweats but I do not believe this is related to her CLL.  Could consider that a B symptoms start treatment if persistent or worsening --Indications for CLL treatment noted below. --No clear indication to start treatment at this time.  Recommend observation.   --RTC in 6 months time.

## 2023-07-22 NOTE — Progress Notes (Signed)
 Patient Care Team: Toribio Jerel MATSU, MD as PCP - General (Family Medicine) Debera Jayson MATSU, MD as PCP - Cardiology (Cardiology) Debera Jayson MATSU, MD as Consulting Physician (Cardiology) Neysa Reggy BIRCH, MD as Consulting Physician (Pulmonary Disease) Dr. 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  --Today's labs show absolute lymphocyte count of 8400 -- flow cytometry shows the presence of CLL. --Patient does have occasional sweats but I do not believe this is related to her CLL.  Could consider that a B symptoms start treatment if persistent or worsening --Indications for CLL treatment noted below. --No clear indication to start treatment at this time.  Recommend observation.   --RTC in 6 months time.   Iron  deficiency anemia due to chronic blood loss Previously on ferrous sulfate  325 mg daily. Due to GI intolerance, okay to discontinue --Labs from 06/21/2023 show white blood cell count 17.5; hemoglobin 12.9, MCV 95.5, and platelets of 229.  Ferritin is 9 with a saturation ratio 14%. --Recommend another round of IV iron  therapy.  This was put on hold during telephone visit on 07/01/2023 due to patient's new diagnosis of C. difficile.  She has completed 1 round vancomycin  250 mg 4 times daily for 10 days. -Will proceed with orders for IV Venofer  200 mg weekly for 5 doses.  Recheck labs in approximately 8 weeks and follow-up via phone visit to determine need for further treatment. --Has been unable to tolerate oral iron  due to GI problems.  She is seeing gastroenterologist. --Continue incorporate iron  rich foods  into diet.  --RTC for CLL as noted below.    Plan: Labs from 06/21/2023 reviewed again with the patient.  Significant iron  deficiency  without anemia with ferritin level of 9. Referred to [facility] infusion center for IV Venofer  2 mg weekly for 5 treatments.  Recheck labs in 8 to 10 weeks and continue IV iron  treatments as needed. Repeat treatment with oral vancomycin  250 mg 4 times daily for 10 days.  Trial Lomotil  up to 3 times daily if needed for moderate to severe diarrhea.  Strongly encouraged patient to contact GI provider to help treat GI infection and symptoms.  Patient stated she would hang up and contact GI provider right away. -I reached out to Dr. Legrand, patient's GI provider on 07/01/2023.  He is aware of C. difficile infection and treatment with vancomycin . Labs to check iron  deficiency in 8 to 10 weeks.  Phone visit follow-up 1 week after to review.  The patient understands the plans discussed today and is in agreement with them.  She knows to contact our office if she develops concerns prior to her next appointment.  I provided 15 minutes of face-to-face time during this encounter and > 50% was spent counseling as documented under my assessment and plan.    Phyllis FORBES Lessen, NP  [facility] - A DEPT OF [facility] [address] [place] [address] Dept: [phone] Dept Fax: [phone]   No orders of the defined types were placed in this encounter.     CHIEF COMPLAINT:  CC: CLL and iron  deficiency anemia   Current Treatment:  surveillance and IV oron when needed.   INTERVAL HISTORY:  [name]  is here today for repeat clinical assessment. She last spoke tome on 07/01/2023. Initially was to discuss IV iron , however, labs done by her GI showed she was positive for c. Diff. She has completed 10 days of treatment with vancomycin .  Reports that she felt some better while on antibiotics.  Finished antibiotics 07/11/2023.  Continues to have cramping, back pain and moderate to severe diarrhea.  States she is afraid to leave her house because  she may have accident if she does not know where the bathroom is.  She has not made contact with her GI provider.  States she was waiting to follow-up with me first.  Will repeat treatment with oral vancomycin .  Gave prescription for as needed Lomotil  to take for acute diarrhea.  Advised her of need to contact GI provider to treat GI infection and symptoms.  She voiced understanding and states she would contact them right after this visit.  Most recent labs were done 06/21/2023. She was positive for iron  deficiency without anemia. She has benefited from iv iron  in the past.   I have reviewed the past medical history, past surgical history, social history and family history with the patient and they are unchanged from previous note.  ALLERGIES:  is allergic to erythromycin base, azithromycin, pollen extract, remeron [mirtazapine], zolpidem tartrate, other, sulfonamide derivatives, and tree extract.  MEDICATIONS:  Current Outpatient Medications  Medication Sig Dispense Refill   diphenoxylate -atropine  (LOMOTIL ) 2.5-0.025 MG tablet Take 1 tablet by mouth 3 (three) times daily as needed for diarrhea or loose stools. 30 tablet 0   albuterol  (PROVENTIL  HFA;VENTOLIN  HFA) 108 (90 Base) MCG/ACT inhaler Inhale 2 puffs into the lungs every 4 (four) hours as needed for shortness of breath (Asthma).  3   ALPRAZolam  (XANAX ) 1 MG tablet Take 1 mg by mouth at bedtime.  1   atorvastatin  (LIPITOR ) 80 MG tablet Take 1 tablet (80 mg total) by mouth daily. 90 tablet 3   BIOTIN PO Take 1 tablet by mouth daily.     cetirizine (ZYRTEC) 10 MG tablet Take 10 mg by mouth daily as needed for allergies.     Cholecalciferol (VITAMIN D -3) 125 MCG (5000 UT) TABS Take 10,000 Units by mouth daily.     dicyclomine  (BENTYL ) 10 MG capsule TAKE 1 CAPSULE (10 MG TOTAL) BY MOUTH 3 (THREE) TIMES DAILY BEFORE MEALS. 270 capsule 1   diltiazem  (CARDIZEM  CD) 120 MG 24 hr capsule TAKE 1 CAPSULE (120 MG TOTAL) BY MOUTH EVERY MORNING 90 capsule 1    DULoxetine  (CYMBALTA ) 60 MG capsule Take 60 mg by mouth daily.     HYDROcodone -acetaminophen  (NORCO/VICODIN) 5-325 MG tablet Take 1 tablet by mouth 2 (two) times daily.  0   isosorbide  mononitrate (IMDUR ) 60 MG 24 hr tablet TAKE 1 TABLET BY MOUTH EVERY DAY 90 tablet 3   losartan -hydrochlorothiazide  (HYZAAR) 100-25 MG tablet TAKE 1 TABLET BY MOUTH EVERY DAY 90 tablet 3   metoprolol  tartrate (LOPRESSOR ) 25 MG tablet TAKE 1 TABLET BY MOUTH TWICE A DAY 180 tablet 3   MISC NATURAL PRODUCTS PO Take 1 tablet by mouth daily at 6 (six) AM. Renew Liver     montelukast  (SINGULAIR ) 10 MG tablet Take 10 mg by mouth at bedtime.      nitroGLYCERIN  (NITROSTAT ) 0.4 MG SL tablet Place 1 tablet (0.4 mg total) under the tongue every 5 (five) minutes x 3 doses as needed for chest pain (if no relief after 3rd dose, proceed to ED or call 911). 25 tablet 3  NOVOLOG  FLEXPEN 100 UNIT/ML FlexPen Inject 0-30 Units into the skin 3 (three) times daily as needed for high blood sugar (If blood glucose is above 200).     omeprazole  (PRILOSEC) 40 MG capsule TAKE 1 CAPSULE BY MOUTH TWICE A DAY 180 capsule 0   ondansetron  (ZOFRAN ) 4 MG tablet TAKE 1 TABLET BY MOUTH EVERY 8 HOURS AS NEEDED FOR NAUSEA AND VOMITING (Patient taking differently: Taking 1/2 at bedtime) 45 tablet 2   ONETOUCH VERIO test strip 1 each 3 (three) times daily.     promethazine  (PHENERGAN ) 25 MG tablet Take 25 mg by mouth every 6 (six) hours as needed for nausea or vomiting.     traZODone  (DESYREL ) 50 MG tablet Take 150 mg by mouth at bedtime.     TRESIBA  FLEXTOUCH 200 UNIT/ML FlexTouch Pen Inject 40 Units into the skin daily. As directed (Patient taking differently: Inject 45 Units into the skin 2 (two) times daily. As directed Between novolog )     vancomycin  (VANCOCIN ) 250 MG capsule Take 1 capsule (250 mg total) by mouth 4 (four) times daily. 40 capsule 0   No current facility-administered medications for this visit.    HISTORY OF PRESENT ILLNESS:    Oncology History  CLL (chronic lymphocytic leukemia) (HCC)  01/02/2022 Cancer Staging   Staging form: Chronic Lymphocytic Leukemia / Small Lymphocytic Lymphoma, AJCC 8th Edition - Clinical stage from 01/02/2022: Modified Rai Stage 0 (Modified Rai risk: Low, Lymphocytosis: Present, Adenopathy: Absent, Organomegaly: Absent, Anemia: Absent, Thrombocytopenia: Absent) - Signed by Federico Norleen ONEIDA MADISON, MD on 01/11/2022 Stage prefix: Initial diagnosis   01/11/2022 Initial Diagnosis   CLL (chronic lymphocytic leukemia) (HCC)       REVIEW OF SYSTEMS:   Constitutional: Denies fevers, chills or abnormal weight loss.  He had weakness Eyes: Denies blurriness of vision Ears, nose, mouth, throat, and face: Denies mucositis or sore throat Respiratory: Denies cough, dyspnea or wheezes Cardiovascular: Denies palpitation, chest discomfort or lower extremity swelling Gastrointestinal: She has had nausea and vomiting.  Has significant intestinal cramps with diarrhea.  Improved for short time while on antibiotics.  Came right back after she finished them.  Finish antibiotics 07/11/2023.  She has not contacted her GI provider yet. Skin: Denies abnormal skin rashes Lymphatics: Denies new lymphadenopathy or easy bruising Neurological:Denies numbness, tingling or new weaknesses Behavioral/Psych: Mood is stable, no new changes  All other systems were reviewed with the patient and are negative.   VITALS:  There were no vitals taken for this visit.  Wt Readings from Last 3 Encounters:  06/14/23 161 lb 3.2 oz (73.1 kg)  12/28/22 166 lb 4.8 oz (75.4 kg)  12/04/22 169 lb 3.2 oz (76.7 kg)    There is no height or weight on file to calculate BMI.  Performance status (ECOG): 2 - Symptomatic, <50% confined to bed    LABORATORY DATA:  I have reviewed the data as listed    Component Value Date/Time   NA 135 06/21/2023 1021   K 3.4 (L) 06/21/2023 1021   CL 99 06/21/2023 1021   CO2 30 06/21/2023 1021   GLUCOSE  231 (H) 06/21/2023 1021   BUN 12 06/21/2023 1021   CREATININE 0.79 06/21/2023 1021   CALCIUM  9.8 06/21/2023 1021   PROT 7.0 06/21/2023 1021   ALBUMIN 4.1 06/21/2023 1021   AST 18 06/21/2023 1021   ALT 16 06/21/2023 1021   ALKPHOS 107 06/21/2023 1021   BILITOT 0.6 06/21/2023 1021   GFRNONAA >60 06/21/2023 1021  GFRAA >60 12/29/2017 0258    Lab Results  Component Value Date   WBC 17.5 (H) 06/21/2023   NEUTROABS 4.2 06/21/2023   HGB 12.9 06/21/2023   HCT 37.9 06/21/2023   MCV 95.5 06/21/2023   PLT 229 06/21/2023    Iron /TIBC/Ferritin/ %Sat    Component Value Date/Time   IRON  73 06/21/2023 1022   TIBC 518 (H) 06/21/2023 1022   FERRITIN 9 (L) 06/21/2023 1020   IRONPCTSAT 14 06/21/2023 1022

## 2023-07-23 ENCOUNTER — Inpatient Hospital Stay: Payer: Medicare HMO | Attending: Hematology and Oncology | Admitting: Nurse Practitioner

## 2023-07-23 ENCOUNTER — Telehealth: Payer: Self-pay

## 2023-07-23 ENCOUNTER — Telehealth: Payer: Self-pay | Admitting: Gastroenterology

## 2023-07-23 ENCOUNTER — Telehealth: Payer: Self-pay | Admitting: Hematology and Oncology

## 2023-07-23 ENCOUNTER — Encounter: Payer: Self-pay | Admitting: Nurse Practitioner

## 2023-07-23 DIAGNOSIS — D500 Iron deficiency anemia secondary to blood loss (chronic): Secondary | ICD-10-CM

## 2023-07-23 DIAGNOSIS — C9110 Chronic lymphocytic leukemia of B-cell type not having achieved remission: Secondary | ICD-10-CM

## 2023-07-23 MED ORDER — DIPHENOXYLATE-ATROPINE 2.5-0.025 MG PO TABS: 1.0000 | ORAL_TABLET | Freq: Three times a day (TID) | ORAL | 0 refills | Status: DC | PRN

## 2023-07-23 MED ORDER — VANCOMYCIN HCL 125 MG PO CAPS: ORAL_CAPSULE | ORAL | 0 refills | Status: AC

## 2023-07-23 MED ORDER — VANCOMYCIN HCL 250 MG PO CAPS: 250.0000 mg | ORAL_CAPSULE | Freq: Four times a day (QID) | ORAL | 0 refills | Status: DC

## 2023-07-23 NOTE — Telephone Encounter (Signed)
 [name], patient will be scheduled as soon as possible.  Auth Submission: NO AUTH NEEDED Site of care: Site of care: [facility] Payer: [employer] Medication & CPT/J Code(s) submitted: Venofer  (Iron  Sucrose) J1756 Route of submission (phone, fax, portal):  Phone # Fax # Auth type: Buy/Bill PB Units/visits requested: 200mg  x 5 doses Reference number:  Approval from: 07/23/23 to 07/19/24

## 2023-07-23 NOTE — Telephone Encounter (Signed)
 Called and spoke with patient. Pt was seen by Powell Lessen, NP (Oncology) today and was prescribed a second round of Vancomycin . Pt was advised to update our office. Pt states that the loose stools improved some after first treatment but never resolved. Pt has been on a daily probiotic since initial Vancomycin  treatment. Pt reports constant lower back pain and abdominal cramping shortly after eating. Pt states that she is tolerating foods fine and trying to eat yogurt daily. Pt reports going to the restroom pretty often to have bowel movements. Heather prescribed 2nd round of Vancomycin  but patient will not be able to start it for another few days because her pharmacy has to order it. I advised that I would get this information to you. Pt is aware that we will contact her if you have any additional recommendations.

## 2023-07-23 NOTE — Telephone Encounter (Signed)
 Called and spoke with patient and reviewed recommendations. Patient has been advised to start Vancomycin  taper prescribed by Dr. Legrand. Pt had concerns about possible recurrent UTI, pt to f/u with PCP. Pt's oncologist also ordered IV iron , pt is aware that this should not effect her treatment for C. Diff. Pt has been advised to keep f/u with Dr. Legrand in March. Pt verbalized understanding and had no concerns at the end of the call.  Vancomycin  RX sent to pharmacy on file.

## 2023-07-23 NOTE — Telephone Encounter (Signed)
 PT is calling to update [place] that she has been on antibiotics for c-diff and has some questions. Please advise.

## 2023-07-23 NOTE — Telephone Encounter (Signed)
 Although we have not had repeat stool testing, this is probably a recurrence of the C. difficile after recently completing treatment.  So she needs a longer course of vancomycin  treatment for this first recurrence.  She needs a new prescription from our office for the following:  Instructions for a tapering course of vancomycin  125 mg tablet:  1 tablet 4 times daily for 14 days, 1 tablet twice daily for 7 days, 1 tablet daily for 7 days, 1 tablet every other day for 14 days  Dispense 84 tablets, 0 refill   Keep the current early March office appointment that she has, call sooner if needed.  -H. Legrand, MD

## 2023-07-26 ENCOUNTER — Encounter: Payer: Self-pay | Admitting: Physician Assistant

## 2023-07-26 ENCOUNTER — Encounter: Payer: Self-pay | Admitting: Nurse Practitioner

## 2023-07-29 ENCOUNTER — Ambulatory Visit (INDEPENDENT_AMBULATORY_CARE_PROVIDER_SITE_OTHER): Payer: Medicare HMO

## 2023-07-29 VITALS — BP 158/68 | HR 60 | Temp 98.3°F | Resp 20 | Ht 62.0 in | Wt 158.2 lb

## 2023-07-29 DIAGNOSIS — D500 Iron deficiency anemia secondary to blood loss (chronic): Secondary | ICD-10-CM

## 2023-07-29 DIAGNOSIS — E611 Iron deficiency: Secondary | ICD-10-CM

## 2023-07-29 DIAGNOSIS — C9110 Chronic lymphocytic leukemia of B-cell type not having achieved remission: Secondary | ICD-10-CM

## 2023-07-29 MED ORDER — ACETAMINOPHEN 325 MG PO TABS
650.0000 mg | ORAL_TABLET | Freq: Once | ORAL | Status: AC
  Administered 2023-07-29: 650 mg via ORAL
  Filled 2023-07-29: qty 2

## 2023-07-29 MED ORDER — IRON SUCROSE 20 MG/ML IV SOLN
200.0000 mg | Freq: Once | INTRAVENOUS | Status: AC
  Administered 2023-07-29: 200 mg via INTRAVENOUS
  Filled 2023-07-29: qty 10

## 2023-07-29 MED ORDER — DIPHENHYDRAMINE HCL 25 MG PO CAPS
25.0000 mg | ORAL_CAPSULE | Freq: Once | ORAL | Status: AC
  Administered 2023-07-29: 25 mg via ORAL
  Filled 2023-07-29: qty 1

## 2023-07-29 NOTE — Progress Notes (Signed)
 Diagnosis: Iron  Deficiency Anemia  Provider:  Praveen Mannam MD  Procedure: IV Push  IV Type: Peripheral, IV Location: L Antecubital  Venofer  (Iron  Sucrose), Dose: 200 mg  Post Infusion IV Care: Observation period completed and Peripheral IV Discontinued  Discharge: Condition: Good, Destination: Home . AVS Declined  Performed by:  Maximiano JONELLE Pouch, LPN

## 2023-08-05 ENCOUNTER — Ambulatory Visit: Payer: Medicare HMO

## 2023-08-05 VITALS — BP 131/60 | HR 52 | Temp 98.1°F | Resp 18 | Ht 62.0 in | Wt 159.0 lb

## 2023-08-05 DIAGNOSIS — D500 Iron deficiency anemia secondary to blood loss (chronic): Secondary | ICD-10-CM

## 2023-08-05 DIAGNOSIS — E611 Iron deficiency: Secondary | ICD-10-CM

## 2023-08-05 DIAGNOSIS — C9110 Chronic lymphocytic leukemia of B-cell type not having achieved remission: Secondary | ICD-10-CM

## 2023-08-05 MED ORDER — IRON SUCROSE 20 MG/ML IV SOLN
200.0000 mg | INTRAVENOUS | Status: DC
  Administered 2023-08-05: 200 mg via INTRAVENOUS
  Filled 2023-08-05: qty 10

## 2023-08-05 MED ORDER — DIPHENHYDRAMINE HCL 25 MG PO CAPS
25.0000 mg | ORAL_CAPSULE | Freq: Once | ORAL | Status: AC
  Administered 2023-08-05: 25 mg via ORAL
  Filled 2023-08-05: qty 1

## 2023-08-05 MED ORDER — ACETAMINOPHEN 325 MG PO TABS
650.0000 mg | ORAL_TABLET | Freq: Once | ORAL | Status: AC
Start: 2023-08-05 — End: 2023-08-05
  Administered 2023-08-05: 650 mg via ORAL
  Filled 2023-08-05: qty 2

## 2023-08-05 NOTE — Progress Notes (Signed)
Diagnosis: Iron Deficiency Anemia  Provider:  Chilton Greathouse MD  Procedure: IV Push  IV Type: Peripheral, IV Location: L Antecubital  Venofer (Iron Sucrose), Dose: 200 mg  Post Infusion IV Care: Observation period completed   PIV d/ded Discharge: Condition: Good, Destination: Home . AVS Declined  Performed by:  Rico Ala, LPN

## 2023-08-12 ENCOUNTER — Ambulatory Visit: Payer: Medicare HMO

## 2023-08-12 VITALS — BP 159/56 | HR 46 | Temp 97.6°F | Resp 16 | Ht 62.0 in | Wt 162.4 lb

## 2023-08-12 DIAGNOSIS — C9110 Chronic lymphocytic leukemia of B-cell type not having achieved remission: Secondary | ICD-10-CM

## 2023-08-12 DIAGNOSIS — D500 Iron deficiency anemia secondary to blood loss (chronic): Secondary | ICD-10-CM | POA: Diagnosis not present

## 2023-08-12 DIAGNOSIS — K922 Gastrointestinal hemorrhage, unspecified: Secondary | ICD-10-CM

## 2023-08-12 MED ORDER — IRON SUCROSE 20 MG/ML IV SOLN
200.0000 mg | INTRAVENOUS | Status: DC
  Administered 2023-08-12: 200 mg via INTRAVENOUS
  Filled 2023-08-12: qty 10

## 2023-08-12 MED ORDER — DIPHENHYDRAMINE HCL 25 MG PO CAPS
25.0000 mg | ORAL_CAPSULE | Freq: Once | ORAL | Status: DC
Start: 2023-08-12 — End: 2023-08-12

## 2023-08-12 MED ORDER — ACETAMINOPHEN 325 MG PO TABS
650.0000 mg | ORAL_TABLET | Freq: Once | ORAL | Status: DC
Start: 2023-08-12 — End: 2023-08-12

## 2023-08-12 NOTE — Progress Notes (Signed)
Diagnosis: Iron Deficiency Anemia  Provider:  Chilton Greathouse MD  Procedure: IV Push  IV Type: Peripheral, IV Location: L Antecubital  Venofer (Iron Sucrose), Dose: 200 mg  Post Infusion IV Care: Observation period completed and Peripheral IV Discontinued  Discharge: Condition: Good, Destination: Home . AVS Declined  Performed by:  Adriana Mccallum, RN    Patient refused pre-medications. Nurse educated patient and stressed the importance of taking pre-medications as a precaution in the event of a medication reaction. Patient verbalized understanding.

## 2023-08-19 ENCOUNTER — Ambulatory Visit: Payer: Medicare HMO

## 2023-08-19 ENCOUNTER — Ambulatory Visit: Payer: Medicare HMO | Admitting: *Deleted

## 2023-08-19 VITALS — BP 120/57 | HR 58 | Temp 98.6°F | Resp 18 | Ht 62.0 in | Wt 160.8 lb

## 2023-08-19 DIAGNOSIS — E611 Iron deficiency: Secondary | ICD-10-CM | POA: Diagnosis not present

## 2023-08-19 DIAGNOSIS — D500 Iron deficiency anemia secondary to blood loss (chronic): Secondary | ICD-10-CM

## 2023-08-19 DIAGNOSIS — C9110 Chronic lymphocytic leukemia of B-cell type not having achieved remission: Secondary | ICD-10-CM

## 2023-08-19 MED ORDER — IRON SUCROSE 20 MG/ML IV SOLN
200.0000 mg | INTRAVENOUS | Status: DC
Start: 2023-08-19 — End: 2023-08-19
  Administered 2023-08-19: 200 mg via INTRAVENOUS
  Filled 2023-08-19: qty 10

## 2023-08-19 MED ORDER — ACETAMINOPHEN 325 MG PO TABS: 650.0000 mg | ORAL_TABLET | Freq: Once | ORAL | Status: DC

## 2023-08-19 MED ORDER — DIPHENHYDRAMINE HCL 25 MG PO CAPS: 25.0000 mg | ORAL_CAPSULE | Freq: Once | ORAL | Status: DC

## 2023-08-19 NOTE — Progress Notes (Signed)
Diagnosis: Iron Deficiency Anemia  Provider:  Chilton Greathouse MD  Procedure: IV Push  IV Type: Peripheral, IV Location: R Antecubital  Venofer (Iron Sucrose), Dose: 200 mg  Post Infusion IV Care: Observation period completed and Peripheral IV Discontinued  Discharge: Condition: Good, Destination: Home . AVS Declined  Performed by:  Forrest Moron, RN

## 2023-08-26 ENCOUNTER — Ambulatory Visit: Payer: Medicare HMO

## 2023-08-26 VITALS — BP 147/70 | HR 51 | Temp 97.9°F | Resp 18 | Ht 62.0 in | Wt 161.0 lb

## 2023-08-26 DIAGNOSIS — E611 Iron deficiency: Secondary | ICD-10-CM | POA: Diagnosis not present

## 2023-08-26 DIAGNOSIS — D500 Iron deficiency anemia secondary to blood loss (chronic): Secondary | ICD-10-CM

## 2023-08-26 DIAGNOSIS — C9110 Chronic lymphocytic leukemia of B-cell type not having achieved remission: Secondary | ICD-10-CM

## 2023-08-26 MED ORDER — IRON SUCROSE 20 MG/ML IV SOLN
200.0000 mg | INTRAVENOUS | Status: DC
  Administered 2023-08-26: 200 mg via INTRAVENOUS
  Filled 2023-08-26: qty 10

## 2023-08-26 MED ORDER — DIPHENHYDRAMINE HCL 25 MG PO CAPS
25.0000 mg | ORAL_CAPSULE | Freq: Once | ORAL | Status: DC
Start: 2023-08-26 — End: 2023-08-26

## 2023-08-26 MED ORDER — ACETAMINOPHEN 325 MG PO TABS
650.0000 mg | ORAL_TABLET | Freq: Once | ORAL | Status: DC
Start: 2023-08-26 — End: 2023-08-26

## 2023-08-26 NOTE — Progress Notes (Signed)
 Diagnosis: Iron Deficiency Anemia  Provider:  Chilton Greathouse MD  Procedure: IV Push  IV Type: Peripheral, IV Location: L Forearm  Venofer (Iron Sucrose), Dose: 200 mg  Post Infusion IV Care: Observation period completed and Peripheral IV Discontinued  Discharge: Condition: Good, Destination: Home . AVS Declined  Performed by:  Loney Hering, LPN

## 2023-09-02 ENCOUNTER — Other Ambulatory Visit: Payer: Self-pay | Admitting: *Deleted

## 2023-09-02 ENCOUNTER — Inpatient Hospital Stay: Payer: Medicare HMO | Attending: Hematology and Oncology

## 2023-09-02 DIAGNOSIS — C9110 Chronic lymphocytic leukemia of B-cell type not having achieved remission: Secondary | ICD-10-CM | POA: Diagnosis present

## 2023-09-02 DIAGNOSIS — A0472 Enterocolitis due to Clostridium difficile, not specified as recurrent: Secondary | ICD-10-CM | POA: Insufficient documentation

## 2023-09-02 DIAGNOSIS — K922 Gastrointestinal hemorrhage, unspecified: Secondary | ICD-10-CM | POA: Diagnosis not present

## 2023-09-02 DIAGNOSIS — R079 Chest pain, unspecified: Secondary | ICD-10-CM | POA: Diagnosis not present

## 2023-09-02 DIAGNOSIS — Z87891 Personal history of nicotine dependence: Secondary | ICD-10-CM | POA: Insufficient documentation

## 2023-09-02 DIAGNOSIS — Z8042 Family history of malignant neoplasm of prostate: Secondary | ICD-10-CM | POA: Diagnosis not present

## 2023-09-02 DIAGNOSIS — D500 Iron deficiency anemia secondary to blood loss (chronic): Secondary | ICD-10-CM | POA: Insufficient documentation

## 2023-09-02 DIAGNOSIS — E538 Deficiency of other specified B group vitamins: Secondary | ICD-10-CM | POA: Diagnosis not present

## 2023-09-02 DIAGNOSIS — Z792 Long term (current) use of antibiotics: Secondary | ICD-10-CM | POA: Diagnosis not present

## 2023-09-02 DIAGNOSIS — R61 Generalized hyperhidrosis: Secondary | ICD-10-CM | POA: Diagnosis not present

## 2023-09-02 DIAGNOSIS — M2550 Pain in unspecified joint: Secondary | ICD-10-CM | POA: Insufficient documentation

## 2023-09-02 LAB — CMP (CANCER CENTER ONLY)
ALT: 41 U/L (ref 0–44)
AST: 30 U/L (ref 15–41)
Albumin: 4.2 g/dL (ref 3.5–5.0)
Alkaline Phosphatase: 115 U/L (ref 38–126)
Anion gap: 4 — ABNORMAL LOW (ref 5–15)
BUN: 13 mg/dL (ref 8–23)
CO2: 31 mmol/L (ref 22–32)
Calcium: 9.8 mg/dL (ref 8.9–10.3)
Chloride: 95 mmol/L — ABNORMAL LOW (ref 98–111)
Creatinine: 0.82 mg/dL (ref 0.44–1.00)
GFR, Estimated: >60 mL/min (ref >60)
Glucose, Bld: 221 mg/dL — ABNORMAL HIGH (ref 70–99)
Potassium: 4.5 mmol/L (ref 3.5–5.1)
Sodium: 130 mmol/L — ABNORMAL LOW (ref 135–145)
Total Bilirubin: 0.6 mg/dL (ref 0.0–1.2)
Total Protein: 7.1 g/dL (ref 6.5–8.1)

## 2023-09-02 LAB — CBC WITH DIFFERENTIAL (CANCER CENTER ONLY)
Abs Immature Granulocytes: 0.03 10*3/uL (ref 0.00–0.07)
Basophils Absolute: 0.1 10*3/uL (ref 0.0–0.1)
Basophils Relative: 0 %
Eosinophils Absolute: 0.2 10*3/uL (ref 0.0–0.5)
Eosinophils Relative: 1 %
HCT: 40.1 % (ref 36.0–46.0)
Hemoglobin: 13.6 g/dL (ref 12.0–15.0)
Immature Granulocytes: 0 %
Lymphocytes Relative: 71 %
Lymphs Abs: 15.1 10*3/uL — ABNORMAL HIGH (ref 0.7–4.0)
MCH: 32.2 pg (ref 26.0–34.0)
MCHC: 33.9 g/dL (ref 30.0–36.0)
MCV: 95 fL (ref 80.0–100.0)
Monocytes Absolute: 1.7 10*3/uL — ABNORMAL HIGH (ref 0.1–1.0)
Monocytes Relative: 8 %
Neutro Abs: 4.3 10*3/uL (ref 1.7–7.7)
Neutrophils Relative %: 20 %
Platelet Count: 182 10*3/uL (ref 150–400)
RBC: 4.22 MIL/uL (ref 3.87–5.11)
RDW: 13.3 % (ref 11.5–15.5)
WBC Count: 21.4 10*3/uL — ABNORMAL HIGH (ref 4.0–10.5)
nRBC: 0 % (ref 0.0–0.2)

## 2023-09-02 LAB — IRON AND IRON BINDING CAPACITY (CC-WL,HP ONLY)
Iron: 113 ug/dL (ref 28–170)
Saturation Ratios: 31 % (ref 10.4–31.8)
TIBC: 371 ug/dL (ref 250–450)
UIBC: 258 ug/dL (ref 148–442)

## 2023-09-02 LAB — VITAMIN B12: Vitamin B-12: 5037 pg/mL — ABNORMAL HIGH (ref 180–914)

## 2023-09-02 LAB — FOLATE: Folate: 16 ng/mL (ref >5.9)

## 2023-09-02 LAB — FERRITIN: Ferritin: 210 ng/mL (ref 11–307)

## 2023-09-08 ENCOUNTER — Inpatient Hospital Stay: Payer: Medicare HMO | Admitting: Physician Assistant

## 2023-09-08 DIAGNOSIS — D500 Iron deficiency anemia secondary to blood loss (chronic): Secondary | ICD-10-CM

## 2023-09-08 DIAGNOSIS — C9110 Chronic lymphocytic leukemia of B-cell type not having achieved remission: Secondary | ICD-10-CM

## 2023-09-08 NOTE — Progress Notes (Signed)
[name] [phone]   [phone]  PROGRESS NOTE  Patient Care Team: Richardean Chimera, MD as PCP - General (Family Medicine) Jonelle Sidle, MD as PCP - Cardiology (Cardiology) Jonelle Sidle, MD as Consulting Physician (Cardiology) Waymon Budge, MD as Consulting Physician (Pulmonary Disease) Dr. 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 Leonides Schanz and Georga Kaufmann 12/24/2021: flow cytometry confirms lymphocytosis is CLL.   Interval History:  [name] 73 y.o. female with medical history significant for iron deficiency anemia due to GI blood loss and CLL who presents for a follow up visit. The patient's last visit was on 07/23/2023. In the interim since the last visit, she completed another round of IV iron infusions on 08/26/2023.   On exam today [name] reports she continues to be on vancomycin for persistent C.diff infection. Her diarrhea and cramping have improved since then. She reports her energy and appetite are fairly stable. She has some nausea without vomiting episodes. She does have occasional night sweats but no other B symptoms including fevers or unintentional weight loss. She has noticed more joint pain during the colder season. She has not had any overt signs of bleeding.  She reports intermittent episodes of chest pain which resolves with taking nitroglycerin as directed by her cardiologist.  She has no other complaints.  A full 10 point ROS is listed  below.  MEDICAL HISTORY:  Past Medical History:  Diagnosis Date   Anxiety    Arthritis    Asthma    Barrett esophagus    Chronic bronchitis (HCC)    Chronic diarrhea    Chronic lower back pain    CLL (chronic lymphocytic leukemia) (HCC) 01/11/2022   Coronary atherosclerosis of native coronary artery    a. s/p DES to LAD in 2014 b. cath in 12/2017 showing patent LAD stent with mild to moderate RCA and LCx disease c. cath in 04/2021 showing patent LAD stent with 60-70% RCA stenosis but not significant by FFR   Diverticulosis    Essential hypertension    Fatty liver    Fibromyalgia    GERD (gastroesophageal reflux disease)    H/O hiatal hernia    IBS (irritable bowel syndrome)    Major depression in partial remission (HCC)    Migraines    Mixed hyperlipidemia    Myocardial infarction (HCC)    2014   OSA on CPAP    Osteoporosis    Pancreatitis 2008   Pneumonia 1990's   Schatzki's ring    Sleep apnea    Type 2 diabetes mellitus (HCC)     SURGICAL HISTORY: Past Surgical History:  Procedure Laterality Date   5 HOUR PH STUDY N/A 02/16/2018   Procedure: 24 HOUR PH STUDY;  Surgeon: Napoleon Form, MD;  Location: [facility];  Service: Endoscopy;  Laterality: N/A;   ABDOMINAL HYSTERECTOMY  ?1987   APPENDECTOMY  ?1987   BIOPSY  03/02/2021   Procedure: BIOPSY;  Surgeon: Shellia Cleverly, DO;  Location: [facility];  Service: Gastroenterology;;   Arbutus Leas Bilateral 1972   CARPAL TUNNEL RELEASE Bilateral ?1990   CATARACT EXTRACTION W/ INTRAOCULAR LENS  IMPLANT, BILATERAL Bilateral ?2005   CESAREAN SECTION  1985   CHOLECYSTECTOMY  1990'S   COLONOSCOPY  10/13   Dr. Teena Dunk - tubular adenomas and diverticulosis   COLONOSCOPY WITH PROPOFOL N/A 08/28/2021   Procedure: COLONOSCOPY WITH PROPOFOL;  Surgeon: Lemar Lofty., MD;  Location: [facility] ENDOSCOPY;  Service: Gastroenterology;  Laterality: N/A;  ultra slim scope   CORONARY PRESSURE/FFR STUDY N/A 05/12/2021    Procedure: INTRAVASCULAR PRESSURE WIRE/FFR STUDY;  Surgeon: Yvonne Kendall, MD;  Location: [facility];  Service: Cardiovascular;  Laterality: N/A;   DILATION AND CURETTAGE OF UTERUS  1970's   "probably 3" (12/09/2012)   ERCP  2008   ESOPHAGEAL DILATION  ` 2012   ESOPHAGEAL MANOMETRY N/A 10/21/2015   Procedure: ESOPHAGEAL MANOMETRY (EM);  Surgeon: Sherrilyn Rist, MD;  Location: [facility];  Service: Gastroenterology;  Laterality: N/A;   ESOPHAGEAL MANOMETRY N/A 02/16/2018   Procedure: ESOPHAGEAL MANOMETRY (EM);  Surgeon: Napoleon Form, MD;  Location: [facility];  Service: Endoscopy;  Laterality: N/A;   ESOPHAGOGASTRODUODENOSCOPY Left 03/02/2021   Procedure: ESOPHAGOGASTRODUODENOSCOPY (EGD);  Surgeon: Shellia Cleverly, DO;  Location[facility] ENDOSCOPY;  Service: Gastroenterology;  Laterality: Left;   HUMERUS FRACTURE SURGERY Right 07/19/1969   "horst threw me" (12/09/2012)   LEFT HEART CATH AND CORONARY ANGIOGRAPHY N/A 12/29/2017   Procedure: LEFT HEART CATH AND CORONARY ANGIOGRAPHY;  Surgeon: Iran Ouch, MD;  Location: [facility];  Service: Cardiovascular;  Laterality: N/A;   LEFT HEART CATH AND CORONARY ANGIOGRAPHY N/A 05/12/2021   Procedure: LEFT HEART CATH AND CORONARY ANGIOGRAPHY;  Surgeon: Yvonne Kendall, MD;  Location: [facility];  Service: Cardiovascular;  Laterality: N/A;   NASAL SEPTUM SURGERY  1970's   PERCUTANEOUS CORONARY STENT INTERVENTION (PCI-S) N/A 12/09/2012   Procedure: PERCUTANEOUS CORONARY STENT INTERVENTION (PCI-S);  Surgeon: Kathleene Hazel, MD;  Location: [facility] CATH LAB;  Service: Cardiovascular;  Laterality: N/A;   PH IMPEDANCE STUDY N/A 02/16/2018   Procedure: PH IMPEDANCE STUDY;  Surgeon: Napoleon Form, MD;  Location: [facility]OS[facility];  Service: Endoscopy;  Laterality: N/A;   TOE FUSION Right 10/11   First MTP joint   TOE SURGERY Right 1990's   "toe next to big toe:  dr said I had tumor; cut bone & stuff; another OR dr  stretched tendons, etc" (12/09/2012)   TUBAL LIGATION  1985    SOCIAL HISTORY: Social History   Socioeconomic History   Marital status: Married    Spouse name: [name]   Number of children: 2   Years of education: Not on file   Highest education level: Not on file  Occupational History   Occupation: Retired  [employer]  Tobacco Use   Smoking status: Former    Current packs/day: 0.00    Average packs/day: 0.8 packs/day for 17.0 years (13.6 ttl pk-yrs)    Types: Cigarettes    Start date: 07/20/1976    Quit date: 07/20/1993    Years since quitting: 30.1   Smokeless tobacco: Never  Vaping Use   Vaping status: Never Used  Substance and Sexual Activity   Alcohol use: No    Alcohol/week: 0.0 standard drinks of alcohol   Drug use: No   Sexual activity: Yes  Other Topics Concern   Not on file  Social History Narrative   Not on file   Social Drivers of Freeman  Financial Resource Strain: Not on file  Food Insecurity: Not on file  Transportation Needs: Not on file  Physical Activity: Not on file  Stress: Not on file  Social Connections: Not on file  Intimate Partner Violence: Not on file    FAMILY HISTORY: Family History  Problem Relation Age of Onset   Stroke Mother    Cirrhosis Father    Hypertension Father    Esophageal cancer Sister    Colon polyps Sister        pre-cancerous, part of colon removed   Prostate cancer Brother    Colon cancer Neg Hx    Rectal cancer Neg Hx    Stomach cancer Neg Hx     ALLERGIES:  is allergic to erythromycin base, azithromycin, pollen extract, remeron [mirtazapine], zolpidem tartrate, other, sulfonamide derivatives, and tree extract.  MEDICATIONS:  Current Outpatient Medications  Medication Sig Dispense Refill   albuterol (PROVENTIL HFA;VENTOLIN HFA) 108 (90 Base) MCG/ACT inhaler Inhale 2 puffs into the lungs every 4 (four) hours as needed for shortness of breath (Asthma).  3   ALPRAZolam (XANAX) 1 MG tablet Take 1 mg by mouth at bedtime.   1   atorvastatin (LIPITOR) 80 MG tablet Take 1 tablet (80 mg total) by mouth daily. 90 tablet 3   BIOTIN PO Take 1 tablet by mouth daily.     cetirizine (ZYRTEC) 10 MG tablet Take 10 mg by mouth daily as needed for allergies.     Cholecalciferol (VITAMIN D-3) 125 MCG (5000 UT) TABS Take 10,000 Units by mouth daily.     dicyclomine (BENTYL) 10 MG capsule TAKE 1 CAPSULE (10 MG TOTAL) BY MOUTH 3 (THREE) TIMES DAILY BEFORE MEALS. 270 capsule 1   diltiazem (CARDIZEM CD) 120 MG 24 hr capsule TAKE 1 CAPSULE (120 MG TOTAL) BY MOUTH EVERY MORNING 90 capsule 1   diphenoxylate-atropine (LOMOTIL) 2.5-0.025 MG tablet Take 1 tablet by mouth 3 (three) times daily as needed for diarrhea or loose stools. 30 tablet 0   DULoxetine (CYMBALTA) 60 MG capsule Take 60 mg by mouth daily.     HYDROcodone-acetaminophen (NORCO/VICODIN) 5-325 MG tablet Take 1 tablet by mouth 2 (two) times daily.  0   isosorbide mononitrate (IMDUR) 60 MG 24 hr tablet TAKE 1 TABLET BY MOUTH EVERY DAY 90 tablet 3   losartan-hydrochlorothiazide (HYZAAR) 100-25 MG tablet TAKE 1 TABLET BY MOUTH EVERY DAY 90 tablet 3   metoprolol tartrate (LOPRESSOR) 25 MG tablet TAKE 1 TABLET BY MOUTH TWICE A DAY 180 tablet 3   MISC NATURAL PRODUCTS PO Take 1 tablet by mouth daily at 6 (six) AM. Renew Liver     montelukast (SINGULAIR) 10 MG tablet Take 10 mg by mouth at bedtime.      nitroGLYCERIN (NITROSTAT) 0.4 MG SL tablet Place 1 tablet (0.4 mg total) under the tongue every 5 (five) minutes x 3 doses as needed for chest pain (if no relief after 3rd dose, proceed to ED or call 911). 25 tablet 3   NOVOLOG FLEXPEN 100 UNIT/ML FlexPen Inject 0-30 Units into the skin 3 (three) times daily as needed for high blood sugar (If blood glucose is above 200).     omeprazole (PRILOSEC) 40 MG capsule TAKE 1 CAPSULE BY MOUTH TWICE A DAY 180 capsule 0   ondansetron (ZOFRAN) 4 MG tablet TAKE 1 TABLET BY MOUTH EVERY 8 HOURS AS NEEDED FOR NAUSEA AND VOMITING (Patient taking  differently: Taking 1/2 at bedtime) 45 tablet 2   ONETOUCH VERIO test strip 1  each 3 (three) times daily.     promethazine (PHENERGAN) 25 MG tablet Take 25 mg by mouth every 6 (six) hours as needed for nausea or vomiting.     traZODone (DESYREL) 50 MG tablet Take 150 mg by mouth at bedtime.     TRESIBA FLEXTOUCH 200 UNIT/ML FlexTouch Pen Inject 40 Units into the skin daily. As directed (Patient taking differently: Inject 45 Units into the skin 2 (two) times daily. As directed Between novolog)     No current facility-administered medications for this visit.    REVIEW OF SYSTEMS:   Constitutional: ( - ) fevers, ( - )  chills , ( + ) night sweats Eyes: ( - ) blurriness of vision, ( - ) double vision, ( - ) watery eyes Ears, nose, mouth, throat, and face: ( - ) mucositis, ( - ) sore throat Respiratory: ( - ) cough, ( - ) dyspnea, ( - ) wheezes Cardiovascular: ( - ) palpitation, ( + ) chest discomfort, ( - ) lower extremity swelling Gastrointestinal:  ( + ) nausea, ( - ) heartburn, ( + ) change in bowel habits Skin: ( - ) abnormal skin rashes Lymphatics: ( - ) new lymphadenopathy, ( - ) easy bruising Neurological: ( - ) numbness, ( - ) tingling, ( - ) new weaknesses Behavioral/Psych: ( - ) mood change, ( - ) new changes  All other systems were reviewed with the patient and are negative.  PHYSICAL EXAMINATION: Not performed due to virtual visit.   LABORATORY DATA:  I have reviewed the data as listed    Latest Ref Rng & Units 09/02/2023   10:48 AM 06/21/2023   10:21 AM 04/12/2023   11:13 AM  CBC  WBC 4.0 - 10.5 K/uL 21.4  17.5  17.5   Hemoglobin 12.0 - 15.0 g/dL 24.4  01.0  27.2   Hematocrit 36.0 - 46.0 % 40.1  37.9  34.8   Platelets 150 - 400 K/uL 182  229  256        Latest Ref Rng & Units 09/02/2023   10:48 AM 06/21/2023   10:21 AM 04/12/2023   11:13 AM  CMP  Glucose 70 - 99 mg/dL 536  644  87   BUN 8 - 23 mg/dL 13  12  11    Creatinine 0.44 - 1.00 mg/dL 0.34  7.42  5.95    Sodium 135 - 145 mmol/L 130  135  139   Potassium 3.5 - 5.1 mmol/L 4.5  3.4  3.6   Chloride 98 - 111 mmol/L 95  99  103   CO2 22 - 32 mmol/L 31  30  33   Calcium 8.9 - 10.3 mg/dL 9.8  9.8  9.7   Total Protein 6.5 - 8.1 g/dL 7.1  7.0  6.5   Total Bilirubin 0.0 - 1.2 mg/dL 0.6  0.6  0.7   Alkaline Phos 38 - 126 U/L 115  107  122   AST 15 - 41 U/L 30  18  29    ALT 0 - 44 U/L 41  16  27     RADIOGRAPHIC STUDIES: No results found.  ASSESSMENT & PLAN JISSEL SLAVENS 73 y.o. female with medical history significant for iron deficiency anemia due to GI blood loss and newly diagnosed CLL who presents for a follow up visit.  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 Due to GI intolerance, okay to discontinue --Last  received IV venofer 200 mg x 5 doses from 07/29/2023-08/26/2023. --Labs from 09/02/2023 show no evidence of anemia with Hgb 13.6, MCV 95.0. Iron panel shows no deficiency with iron 113, TIBC 371, saturation 31%, ferritin 210. --No need for additional IV iron at this time --Continue incorporate iron rich foods  into diet.  --Repeat iron panel in 3 months.    #CLL Rai Stage 0: --There is evidence of mild lymphocytosis that has been present since August 2022.  -- flow cytometry from June 2023 confirmed presence of CLL. --Labs from 09/02/2023 showed WBC 21.4, Hgb 13.6, Plt 182, Ab lymph count 15.1.  --Patient does night sweats otherwise no other B symptoms.  --Indications for CLL treatment noted below. --No clear indication to start treatment at this time.  Recommend observation.   --Repeat labs in 3 months.   #C.diff infection: --Currently on vancomycin therapy managed by GI.  --Reports diarrhea with cramping is improving.   #Vitamin B12 deficiency: --Currently taking vitamin B12 supplementation twice a day.  --B12 level checked on 09/02/2023 measured 5,037.  --Advised to decrease supplementation to once daily.    No orders of the defined types were placed in this encounter.  All questions were answered. The patient knows to call the clinic with any problems, questions or concerns.  A total of more than 25 minutes were spent on this encounter with non-face-to-face time, including preparing to see the patient, ordering tests and/or medications, counseling the patient and coordination of care as outlined above.   Georga Kaufmann PA-C Dept of Hematology and Oncology [facility] Phone: [phone]   09/08/2023 11:16 AM  Mathis Fare BD, John Giovanni, Caligaris-Cappio F, Dighiero G, Dhner H, [place], [place], [place] E, Chiorazzi N, Roberts, Rai KR, [place], Eichhorst B, O'Brien S, Robak T, Seymour JF, Landa UJ. iwCLL guidelines for diagnosis, indications for treatment, response assessment, and supportive management of CLL. Blood. 2018 Jun 21;131(25):2745-2760.  Active disease should be clearly documented to initiate therapy. At least 1 of the following criteria should be met.  1) Evidence of progressive marrow failure as manifested by the development of, or worsening of, anemia and/or thrombocytopenia. Cutoff levels of Hb <10 g/dL or platelet counts <811  109/L are generally regarded as indication for treatment. However, in some patients, platelet counts <100  109/L may remain stable over a long period; this situation does not automatically require therapeutic intervention. 2) Massive (ie, >=6 cm below the left costal margin) or progressive or symptomatic splenomegaly. 3) Massive nodes (ie, >=10 cm in longest diameter) or progressive or symptomatic lymphadenopathy. 4) Progressive lymphocytosis with an increase of >=50% over a 45-month period, or lymphocyte doubling time (LDT) <6 months. LDT can be obtained by linear regression extrapolation of absolute lymphocyte counts obtained at intervals of 2 weeks over an observation period of 2 to 3 months;  patients with initial blood lymphocyte counts <30  109/L may require a longer observation period to determine the LDT. Factors contributing to lymphocytosis other than CLL (eg, infections, steroid administration) should be excluded. 5) Autoimmune complications including anemia or thrombocytopenia poorly responsive to corticosteroids. 6) Symptomatic or functional extranodal involvement (eg, skin, kidney, lung, spine). Disease-related symptoms as defined by any of the following: Unintentional weight loss >=10% within the previous 6 months. Significant fatigue (ie, ECOG performance scale 2 or worse; cannot work or unable to perform usual activities). Fevers >=100.40F or 38.0C for 2 or more weeks without evidence of infection. Night sweats  for >=1 month without evidence of infection.

## 2023-09-10 ENCOUNTER — Telehealth: Payer: Self-pay | Admitting: Hematology and Oncology

## 2023-09-18 ENCOUNTER — Other Ambulatory Visit: Payer: Self-pay | Admitting: Gastroenterology

## 2023-09-21 ENCOUNTER — Ambulatory Visit: Payer: Medicare HMO | Admitting: Gastroenterology

## 2023-09-21 ENCOUNTER — Other Ambulatory Visit (INDEPENDENT_AMBULATORY_CARE_PROVIDER_SITE_OTHER)

## 2023-09-21 ENCOUNTER — Encounter: Payer: Self-pay | Admitting: Gastroenterology

## 2023-09-21 VITALS — BP 132/62 | HR 78 | Ht 62.0 in | Wt 161.0 lb

## 2023-09-21 DIAGNOSIS — K582 Mixed irritable bowel syndrome: Secondary | ICD-10-CM

## 2023-09-21 DIAGNOSIS — K7460 Unspecified cirrhosis of liver: Secondary | ICD-10-CM | POA: Diagnosis not present

## 2023-09-21 DIAGNOSIS — A0472 Enterocolitis due to Clostridium difficile, not specified as recurrent: Secondary | ICD-10-CM

## 2023-09-21 DIAGNOSIS — I8510 Secondary esophageal varices without bleeding: Secondary | ICD-10-CM

## 2023-09-21 DIAGNOSIS — R1314 Dysphagia, pharyngoesophageal phase: Secondary | ICD-10-CM

## 2023-09-21 DIAGNOSIS — K648 Other hemorrhoids: Secondary | ICD-10-CM

## 2023-09-21 DIAGNOSIS — R140 Abdominal distension (gaseous): Secondary | ICD-10-CM

## 2023-09-21 DIAGNOSIS — D500 Iron deficiency anemia secondary to blood loss (chronic): Secondary | ICD-10-CM

## 2023-09-21 LAB — TSH: TSH: 1.36 u[IU]/mL (ref 0.35–5.50)

## 2023-09-21 LAB — PROTIME-INR
INR: 1.2 ratio — ABNORMAL HIGH (ref 0.8–1.0)
Prothrombin Time: 12.2 s (ref 9.6–13.1)

## 2023-09-21 NOTE — Patient Instructions (Signed)
 Your provider has requested that you go to the basement level for lab work before leaving today. Press "B" on the elevator. The lab is located at the first door on the left as you exit the elevator.  You have been scheduled for an abdominal ultrasound at [facility] (1st floor of hospital) on     at      . Please arrive 30 minutes prior to your appointment for registration. Make certain not to have anything to eat or drink 6 hours prior to your appointment. Should you need to reschedule your appointment, please contact radiology at [phone]. This test typically takes about 30 minutes to perform.  _______________________________________________________  If your blood pressure at your visit was 140/90 or greater, please contact your primary care physician to follow up on this.  _______________________________________________________  If you are age 75 or older, your body mass index should be between 23-30. Your Body mass index is 29.45 kg/m. If this is out of the aforementioned range listed, please consider follow up with your Primary Care Provider.  If you are age 57 or younger, your body mass index should be between 19-25. Your Body mass index is 29.45 kg/m. If this is out of the aformentioned range listed, please consider follow up with your Primary Care Provider.   ________________________________________________________  The [facility] providers would like to encourage you to use [facility] to communicate with providers for non-urgent requests or questions.  Due to long hold times on the telephone, sending your provider a message by [facility] may be a faster and more efficient way to get a response.  Please allow 48 business hours for a response.  Please remember that this is for non-urgent requests.  _______________________________________________________ It was a pleasure to see you today!  Thank you for trusting me with your gastrointestinal care!

## 2023-09-21 NOTE — Progress Notes (Signed)
 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    Incomplete colonoscopy with Dr. Myrtie Neither due to severe left colonic tortuosity December 2022 Successful colonoscopy with Dr. 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  Followed by Dr. Jeanie Sewer for periodic iron infusions (has one scheduled tomorrow).   Coronary artery disease with last intervention 2014, sees Dr. Diona Browner.  On aspirin 81 mg every other day  Chronic abdominal pain and IBS-like picture primarily constipation with intermittent diarrhea.  Little improvement with dicyclomine.  Rec'd an empiric course of metronidazole for possible SIBO. -Uncertain symptom improvement based on her recollection.  Last in person clinic visit with me November 2023.  Trial of low-dose Linzess and up-to-date MELD labs with HCC screening done.  Follows with hematology for the IDA and her CLL, getting regular iron infusions.  Hematology requested another video capsule study to rule out overt AVM bleeding.  Video capsule study May 2024 with 2 diminutive nonbleeding small bowel AVMs beyond the reach of push enteroscopy. She was evaluated by Dr. Wayne Sever of [facility] by telemedicine July 2024-his opinion was that double-balloon enteroscopy would be greater risk than benefit.  Kaylise contacted [place] early December 2024 with several weeks of worsening crampy abdominal pain and diarrhea. Stool study positive for C. difficile, for which she received a 10-day course of oral vancomycin. Symptoms persisted and first week of January 2024 was prescribed a tapering course of vancomycin as follows:  1 tablet 4 times daily for 14 days, 1 tablet twice daily for 7 days, 1 tablet daily for 7 days, 1 tablet  every other day for 14 days ________________  Steward Drone is glad to report that she is feeling much better after completing the tapering vancomycin course.  She is back to her baseline of IBS symptoms with some intermittent bloating and gassiness with irregular BMs.  Sometimes a feeling of constipation and other times loose stool.  Intermittent hemorrhoidal bleeding. 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  No stricture on last EGD.   ROS: Cardiovascular:  no chest pain Respiratory: no dyspnea Fatigue Remainder systems negative except as above The patient's Past Medical, Family and Social History were reviewed and are on file in the EMR. 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 s/p DES to LAD in 2014 b. cath in 12/2017 showing patent LAD stent with mild to moderate RCA and LCx disease c. cath in 04/2021 showing patent LAD stent with 60-70% RCA stenosis but not significant by FFR   Diverticulosis    Essential hypertension    Fatty liver    Fibromyalgia    GERD (gastroesophageal reflux disease)    H/O hiatal hernia    IBS (irritable bowel syndrome)    Major depression in partial remission (HCC)    Migraines    Mixed hyperlipidemia    Myocardial infarction (HCC)  2014   OSA on CPAP    Osteoporosis    Pancreatitis 2008   Pneumonia 1990's   Schatzki's ring    Sleep apnea    Type 2 diabetes mellitus (HCC)     Past Surgical History:  Procedure Laterality Date   30 HOUR PH STUDY N/A 02/16/2018   Procedure: 24 HOUR PH STUDY;  Surgeon: Napoleon Form, MD;  Location: [facility];  Service: Endoscopy;  Laterality: N/A;   ABDOMINAL HYSTERECTOMY   ?1987   APPENDECTOMY  ?1987   BIOPSY  03/02/2021   Procedure: BIOPSY;  Surgeon: Shellia Cleverly, DO;  Location: [facility];  Service: Gastroenterology;;   BUNIONECTOMY Bilateral 1972   CARPAL TUNNEL RELEASE Bilateral ?1990   CATARACT EXTRACTION W/ INTRAOCULAR LENS  IMPLANT, BILATERAL Bilateral ?2005   CESAREAN SECTION  1985   CHOLECYSTECTOMY  1990'S   COLONOSCOPY  10/13   Dr. Teena Dunk - tubular adenomas and diverticulosis   COLONOSCOPY WITH PROPOFOL N/A 08/28/2021   Procedure: COLONOSCOPY WITH PROPOFOL;  Surgeon: Lemar Lofty., MD;  Location: [facility] ENDOSCOPY;  Service: Gastroenterology;  Laterality: N/A;  ultra slim scope   CORONARY PRESSURE/FFR STUDY N/A 05/12/2021   Procedure: INTRAVASCULAR PRESSURE WIRE/FFR STUDY;  Surgeon: Yvonne Kendall, MD;  Location: [facility];  Service: Cardiovascular;  Laterality: N/A;   DILATION AND CURETTAGE OF UTERUS  1970's   "probably 3" (12/09/2012)   ERCP  2008   ESOPHAGEAL DILATION  ` 2012   ESOPHAGEAL MANOMETRY N/A 10/21/2015   Procedure: ESOPHAGEAL MANOMETRY (EM);  Surgeon: Sherrilyn Rist, MD;  Location: [facility];  Service: Gastroenterology;  Laterality: N/A;   ESOPHAGEAL MANOMETRY N/A 02/16/2018   Procedure: ESOPHAGEAL MANOMETRY (EM);  Surgeon: Napoleon Form, MD;  Location: [facility];  Service: Endoscopy;  Laterality: N/A;   ESOPHAGOGASTRODUODENOSCOPY Left 03/02/2021   Procedure: ESOPHAGOGASTRODUODENOSCOPY (EGD);  Surgeon: Shellia Cleverly, DO;  Location: [facility] ENDOSCOPY;  Service: Gastroenterology;  Laterality: Left;   HUMERUS FRACTURE SURGERY Right 07/19/1969   "horst threw me" (12/09/2012)   LEFT HEART CATH AND CORONARY ANGIOGRAPHY N/A 12/29/2017   Procedure: LEFT HEART CATH AND CORONARY ANGIOGRAPHY;  Surgeon: Iran Ouch, MD;  [facility]: [facility];  Service: Cardiovascular;  Laterality: N/A;   LEFT HEART CATH AND CORONARY ANGIOGRAPHY N/A 05/12/2021   Procedure: LEFT HEART CATH AND CORONARY ANGIOGRAPHY;   Surgeon: Yvonne Kendall, MD;  Location: [facility];  Service: Cardiovascular;  Laterality: N/A;   NASAL SEPTUM SURGERY  1970's   PERCUTANEOUS CORONARY STENT INTERVENTION (PCI-S) N/A 12/09/2012   Procedure: PERCUTANEOUS CORONARY STENT INTERVENTION (PCI-S);  Surgeon: Kathleene Hazel, MD;  Location: [facility] LAB;  Service: Cardiovascular;  Laterality: N/A;   PH IMPEDANCE STUDY N/A 02/16/2018   Procedure: PH IMPEDANCE STUDY;  Surgeon: Napoleon Form, MD;  Location: [facility];  Service: Endoscopy;  Laterality: N/A;   TOE FUSION Right 10/11   First MTP joint   TOE SURGERY Right 1990's   "toe next to big toe:  dr said I had tumor; cut bone & stuff; another OR dr stretched tendons, etc" (12/09/2012)   TUBAL LIGATION  1985     Objective:  Med list reviewed  Current Outpatient Medications:    albuterol (PROVENTIL HFA;VENTOLIN HFA) 108 (90 Base) MCG/ACT inhaler, Inhale 2 puffs into the lungs every 4 (four) hours as needed for shortness of breath (Asthma)., Disp: , Rfl: 3   ALPRAZolam (XANAX) 1 MG tablet, Take 1 mg by mouth at bedtime., Disp: ,  Rfl: 1   atorvastatin (LIPITOR) 80 MG tablet, Take 1 tablet (80 mg total) by mouth daily., Disp: 90 tablet, Rfl: 3   BIOTIN PO, Take 1 tablet by mouth daily., Disp: , Rfl:    cetirizine (ZYRTEC) 10 MG tablet, Take 10 mg by mouth daily as needed for allergies., Disp: , Rfl:    Cholecalciferol (VITAMIN D-3) 125 MCG (5000 UT) TABS, Take 10,000 Units by mouth daily., Disp: , Rfl:    dicyclomine (BENTYL) 10 MG capsule, TAKE 1 CAPSULE (10 MG TOTAL) BY MOUTH 3 (THREE) TIMES DAILY BEFORE MEALS., Disp: 270 capsule, Rfl: 1   diltiazem (CARDIZEM CD) 120 MG 24 hr capsule, TAKE 1 CAPSULE (120 MG TOTAL) BY MOUTH EVERY MORNING, Disp: 90 capsule, Rfl: 1   diphenoxylate-atropine (LOMOTIL) 2.5-0.025 MG tablet, Take 1 tablet by mouth 3 (three) times daily as needed for diarrhea or loose stools., Disp: 30 tablet, Rfl: 0   DULoxetine (CYMBALTA) 60 MG capsule,  Take 60 mg by mouth daily., Disp: , Rfl:    HYDROcodone-acetaminophen (NORCO/VICODIN) 5-325 MG tablet, Take 1 tablet by mouth 2 (two) times daily., Disp: , Rfl: 0   isosorbide mononitrate (IMDUR) 60 MG 24 hr tablet, TAKE 1 TABLET BY MOUTH EVERY DAY, Disp: 90 tablet, Rfl: 3   losartan-hydrochlorothiazide (HYZAAR) 100-25 MG tablet, TAKE 1 TABLET BY MOUTH EVERY DAY, Disp: 90 tablet, Rfl: 3   metoprolol tartrate (LOPRESSOR) 25 MG tablet, TAKE 1 TABLET BY MOUTH TWICE A DAY, Disp: 180 tablet, Rfl: 3   MISC NATURAL PRODUCTS PO, Take 1 tablet by mouth daily at 6 (six) AM. Renew Liver, Disp: , Rfl:    montelukast (SINGULAIR) 10 MG tablet, Take 10 mg by mouth at bedtime. , Disp: , Rfl:    nitroGLYCERIN (NITROSTAT) 0.4 MG SL tablet, Place 1 tablet (0.4 mg total) under the tongue every 5 (five) minutes x 3 doses as needed for chest pain (if no relief after 3rd dose, proceed to ED or call 911)., Disp: 25 tablet, Rfl: 3   NOVOLOG FLEXPEN 100 UNIT/ML FlexPen, Inject 0-30 Units into the skin 3 (three) times daily as needed for high blood sugar (If blood glucose is above 200)., Disp: , Rfl:    omeprazole (PRILOSEC) 40 MG capsule, TAKE 1 CAPSULE BY MOUTH TWICE A DAY, Disp: 90 capsule, Rfl: 0   ondansetron (ZOFRAN) 4 MG tablet, TAKE 1 TABLET BY MOUTH EVERY 8 HOURS AS NEEDED FOR NAUSEA AND VOMITING (Patient taking differently: Taking 1/2 at bedtime), Disp: 45 tablet, Rfl: 2   ONETOUCH VERIO test strip, 1 each 3 (three) times daily., Disp: , Rfl:    promethazine (PHENERGAN) 25 MG tablet, Take 25 mg by mouth every 6 (six) hours as needed for nausea or vomiting., Disp: , Rfl:    traZODone (DESYREL) 50 MG tablet, Take 150 mg by mouth at bedtime., Disp: , Rfl:    TRESIBA FLEXTOUCH 200 UNIT/ML FlexTouch Pen, Inject 40 Units into the skin daily. As directed (Patient taking differently: Inject 45 Units into the skin 2 (two) times daily. As directed Between novolog), Disp: , Rfl:    Vital signs in last 24 hrs: Vitals:    09/21/23 1022  BP: 132/62  Pulse: 78   Wt Readings from Last 3 Encounters:  09/21/23 161 lb (73 kg)  08/26/23 161 lb (73 kg)  08/19/23 160 lb 12.8 oz (72.9 kg)  Weight stable lately Physical Exam  Well-appearing HEENT: sclera anicteric, oral mucosa moist without lesions Neck: supple, no thyromegaly, JVD or lymphadenopathy Cardiac:  Regular,  no peripheral edema Pulm: clear to auscultation bilaterally, normal RR and effort noted Abdomen: soft, tenderness to light diffuse palpation of the abdominal wall, with active bowel sounds.  Difficult to assess for hepatosplenomegaly since she is sensitive to exam. Skin; warm and dry, no jaundice or rash   Labs:     Latest Ref Rng & Units 09/02/2023   10:48 AM 06/21/2023   10:21 AM 04/12/2023   11:13 AM  CBC  WBC 4.0 - 10.5 K/uL 21.4  17.5  17.5   Hemoglobin 12.0 - 15.0 g/dL 16.1  09.6  04.5   Hematocrit 36.0 - 46.0 % 40.1  37.9  34.8   Platelets 150 - 400 K/uL 182  229  256    Iron studies nml 09/02/23     Latest Ref Rng & Units 09/02/2023   10:48 AM 06/21/2023   10:21 AM 04/12/2023   11:13 AM  CMP  Glucose 70 - 99 mg/dL 409  811  87   BUN 8 - 23 mg/dL 13  12  11    Creatinine 0.44 - 1.00 mg/dL 9.14  7.82  9.56   Sodium 135 - 145 mmol/L 130  135  139   Potassium 3.5 - 5.1 mmol/L 4.5  3.4  3.6   Chloride 98 - 111 mmol/L 95  99  103   CO2 22 - 32 mmol/L 31  30  33   Calcium 8.9 - 10.3 mg/dL 9.8  9.8  9.7   Total Protein 6.5 - 8.1 g/dL 7.1  7.0  6.5   Total Bilirubin 0.0 - 1.2 mg/dL 0.6  0.6  0.7   Alkaline Phos 38 - 126 U/L 115  107  122   AST 15 - 41 U/L 30  18  29    ALT 0 - 44 U/L 41  16  27     Last INR 1.1. in Nov 2023 ___________________________________________ Radiologic studies: Last HCC screening ultrasound November 2023  ____________________________________________ Other:   _____________________________________________   Encounter Diagnoses  Name Primary?   Iron deficiency anemia due to chronic blood loss Yes    Secondary esophageal varices without bleeding (HCC)    Cirrhosis of liver without ascites, unspecified hepatic cirrhosis type (HCC)    Irritable bowel syndrome with both constipation and diarrhea    C. difficile diarrhea    Abdominal bloating    Pharyngoesophageal dysphagia    Bleeding internal hemorrhoids    Ongoing chronic blood loss ideation small bowel AVMs not amenable to endoscopic therapy (see above).  Lately under good control with close management from our hematology colleagues and regular IV iron dosing.  Cryptogenic cirrhosis, presumed fatty liver.  Grade 1 esophageal varices discovered in 2022, due for surveillance later this year.  Recent protracted course of C. difficile requiring tapering vancomycin course, now sounds back to her baseline of underlying IBS.  If she has escalation of pain and diarrhea such as was bothering her in late November/early December 2024, she will contact us and we would do repeat stool testing with the C. difficile PCR, antigen and toxin.  Abdominal bloating related to underlying IBS and possibly some maldigestion of certain foodstuffs especially cruciferous vegetables.  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  Chin tuck maneuver recommended in addition to caution while eating.  No history or symptoms of aspiration.  Hemorrhoidal bleeding, would not recommend banding due to underlying portal hypertension and risk for significant postprocedural ulcer bleeding. As needed use Preparation H suppository.  This problem is also  challenging to manage due to her bowel habit irregularity from underlying IBS.  Plan today:  INR to update MELD score and AFP for HCC screening Right upper quadrant ultrasound for HCC screening  See me in about 6 months for follow-up, at which time she will be due for repeat EGD as noted above  40 minutes were spent on this encounter (including chart review, history/exam, counseling/coordination  of care, and documentation) > 50% of that time was spent on counseling and coordination of care.  Medically complex, extensive chart review time the patient required.  Charlie Pitter III

## 2023-09-23 ENCOUNTER — Telehealth: Payer: Self-pay | Admitting: Cardiology

## 2023-09-23 LAB — AFP TUMOR MARKER: AFP-Tumor Marker: 5.3 ng/mL

## 2023-09-23 MED ORDER — NITROGLYCERIN 0.4 MG SL SUBL: 0.4000 mg | SUBLINGUAL_TABLET | SUBLINGUAL | 3 refills | Status: DC | PRN

## 2023-09-23 NOTE — Telephone Encounter (Signed)
*  STAT* If patient is at the pharmacy, call can be transferred to refill team.   1. Which medications need to be refilled? (please list name of each medication and dose if known) nitroGLYCERIN (NITROSTAT) 0.4 MG SL tablet   2. Which pharmacy/location (including street and city if local pharmacy) is medication to be sent to? [id number] - [place], [place] - [address] [phone]   3. Do they need a 30 day or 90 day supply? 90

## 2023-09-23 NOTE — Telephone Encounter (Signed)
 Filled

## 2023-09-29 ENCOUNTER — Ambulatory Visit (HOSPITAL_COMMUNITY)
Admission: RE | Admit: 2023-09-29 | Discharge: 2023-09-29 | Disposition: A | Discharge status: Home / Self Care | Source: Ambulatory Visit | Attending: Gastroenterology | Admitting: Gastroenterology

## 2023-09-29 DIAGNOSIS — K7460 Unspecified cirrhosis of liver: Secondary | ICD-10-CM | POA: Diagnosis present

## 2023-10-05 ENCOUNTER — Telehealth: Payer: Self-pay

## 2023-10-05 NOTE — Telephone Encounter (Signed)
 Pharmacy Patient Advocate Encounter   Received notification from CoverMyMeds that prior authorization for Dicyclomine 10 mg capsules is required/requested.   Insurance verification completed.   The patient is insured through [facility] .   Per test claim: PA required; PA submitted to above mentioned insurance via CoverMyMeds Key/confirmation #/EOC [id number] Status is pending

## 2023-10-07 NOTE — Telephone Encounter (Signed)
 Pharmacy Patient Advocate Encounter  Received notification from [facility] that Prior Authorization for Dicyclomine 10 mg capsules has been APPROVED from 07-21-2023 to 07-19-2024   PA #/Case ID/Reference #: [id number]

## 2023-10-12 ENCOUNTER — Encounter: Payer: Self-pay | Admitting: Gastroenterology

## 2023-10-12 ENCOUNTER — Other Ambulatory Visit: Payer: Self-pay

## 2023-10-12 ENCOUNTER — Telehealth: Payer: Self-pay | Admitting: Gastroenterology

## 2023-10-12 DIAGNOSIS — R1084 Generalized abdominal pain: Secondary | ICD-10-CM

## 2023-10-12 DIAGNOSIS — A0472 Enterocolitis due to Clostridium difficile, not specified as recurrent: Secondary | ICD-10-CM

## 2023-10-12 NOTE — Telephone Encounter (Signed)
 Patient called requesting to speak wit a nurse stated she is having issues with her stomach. Not sure of she should proceed with a stool test.

## 2023-10-12 NOTE — Telephone Encounter (Signed)
 Called the patient to discuss. No answer. Left a voicemail of my call.

## 2023-10-12 NOTE — Telephone Encounter (Signed)
 Patient reports diarrhea and urgency x 1 week. "I spend an hour in the bathroom because I keep having to go." Eating or drinking triggers her need to move her bowels. She complains of "terrible stomach cramps" despite dicyclomine. Please advise

## 2023-10-12 NOTE — Telephone Encounter (Signed)
 Patient aware of the plan and will come to the lab tomorrow morning.

## 2023-10-12 NOTE — Telephone Encounter (Signed)
 This is concerning because she protracted course of C. difficile in December and January requiring a long tapering course of vancomycin.  Please make arrangements for her to come to the lab this week for a C. difficile PCR, GDH antigen and toxin test.  Then be on the look out for those results in case they come back while I am out of the office.  Please contact DOD if there is a positive result and I am out of the office, as the patient would need to be treated with Dificid.  Ellwood Dense MD

## 2023-10-12 NOTE — Telephone Encounter (Signed)
 Yes, attached my Pod's APP's to my inbox.  This only gives him access to check, but will not send the results directly to them.  Ellwood Dense MD

## 2023-10-13 ENCOUNTER — Other Ambulatory Visit

## 2023-10-13 NOTE — Telephone Encounter (Signed)
 Inbound call from patient stating she is still having watery diarrhea and unsure how to proceed. Requesting a call back. Please advise, thank you.

## 2023-10-13 NOTE — Telephone Encounter (Signed)
 Spoke with the patient. She has her specimen containers and will turn these in asap. Encouraged to focus on staying well hydrated.

## 2023-10-13 NOTE — Telephone Encounter (Signed)
 Spoke with the lab and confirmed the specimen will be collected in separate containers because it is going to 2 different labs.

## 2023-10-14 ENCOUNTER — Other Ambulatory Visit

## 2023-10-14 DIAGNOSIS — A0472 Enterocolitis due to Clostridium difficile, not specified as recurrent: Secondary | ICD-10-CM

## 2023-10-14 DIAGNOSIS — R1084 Generalized abdominal pain: Secondary | ICD-10-CM

## 2023-10-15 ENCOUNTER — Other Ambulatory Visit: Payer: Self-pay | Admitting: Gastroenterology

## 2023-10-15 DIAGNOSIS — R197 Diarrhea, unspecified: Secondary | ICD-10-CM

## 2023-10-15 LAB — C. DIFFICILE GDH AND TOXIN A/B
GDH ANTIGEN: NOT DETECTED
MICRO NUMBER:: 16256021
SPECIMEN QUALITY:: ADEQUATE
TOXIN A AND B: NOT DETECTED

## 2023-10-15 MED ORDER — DIPHENOXYLATE-ATROPINE 2.5-0.025 MG PO TABS: 1.0000 | ORAL_TABLET | Freq: Three times a day (TID) | ORAL | 0 refills | Status: DC | PRN

## 2023-10-16 LAB — CLOSTRIDIUM DIFFICILE BY PCR: Toxigenic C. Difficile by PCR: NEGATIVE

## 2023-10-17 ENCOUNTER — Other Ambulatory Visit: Payer: Self-pay | Admitting: Cardiology

## 2023-10-27 ENCOUNTER — Other Ambulatory Visit: Payer: Self-pay | Admitting: Gastroenterology

## 2023-12-01 ENCOUNTER — Other Ambulatory Visit: Payer: Self-pay | Admitting: Physician Assistant

## 2023-12-01 DIAGNOSIS — D500 Iron deficiency anemia secondary to blood loss (chronic): Secondary | ICD-10-CM

## 2023-12-01 DIAGNOSIS — C9110 Chronic lymphocytic leukemia of B-cell type not having achieved remission: Secondary | ICD-10-CM

## 2023-12-02 ENCOUNTER — Inpatient Hospital Stay: Attending: Hematology and Oncology

## 2023-12-02 DIAGNOSIS — D500 Iron deficiency anemia secondary to blood loss (chronic): Secondary | ICD-10-CM | POA: Insufficient documentation

## 2023-12-02 DIAGNOSIS — C9110 Chronic lymphocytic leukemia of B-cell type not having achieved remission: Secondary | ICD-10-CM | POA: Insufficient documentation

## 2023-12-02 DIAGNOSIS — E538 Deficiency of other specified B group vitamins: Secondary | ICD-10-CM | POA: Insufficient documentation

## 2023-12-02 DIAGNOSIS — K2970 Gastritis, unspecified, without bleeding: Secondary | ICD-10-CM | POA: Insufficient documentation

## 2023-12-02 LAB — CMP (CANCER CENTER ONLY)
ALT: 27 U/L (ref 0–44)
AST: 27 U/L (ref 15–41)
Albumin: 4.3 g/dL (ref 3.5–5.0)
Alkaline Phosphatase: 98 U/L (ref 38–126)
Anion gap: 3 — ABNORMAL LOW (ref 5–15)
BUN: 10 mg/dL (ref 8–23)
CO2: 33 mmol/L — ABNORMAL HIGH (ref 22–32)
Calcium: 9.6 mg/dL (ref 8.9–10.3)
Chloride: 99 mmol/L (ref 98–111)
Creatinine: 0.75 mg/dL (ref 0.44–1.00)
GFR, Estimated: >60 mL/min (ref >60)
Glucose, Bld: 163 mg/dL — ABNORMAL HIGH (ref 70–99)
Potassium: 4.1 mmol/L (ref 3.5–5.1)
Sodium: 135 mmol/L (ref 135–145)
Total Bilirubin: 0.7 mg/dL (ref 0.0–1.2)
Total Protein: 7 g/dL (ref 6.5–8.1)

## 2023-12-02 LAB — CBC WITH DIFFERENTIAL (CANCER CENTER ONLY)
Abs Immature Granulocytes: 0.04 10*3/uL (ref 0.00–0.07)
Basophils Absolute: 0.1 10*3/uL (ref 0.0–0.1)
Basophils Relative: 0 %
Eosinophils Absolute: 0.3 10*3/uL (ref 0.0–0.5)
Eosinophils Relative: 1 %
HCT: 39.3 % (ref 36.0–46.0)
Hemoglobin: 13.4 g/dL (ref 12.0–15.0)
Immature Granulocytes: 0 %
Lymphocytes Relative: 75 %
Lymphs Abs: 19.1 10*3/uL — ABNORMAL HIGH (ref 0.7–4.0)
MCH: 32.4 pg (ref 26.0–34.0)
MCHC: 34.1 g/dL (ref 30.0–36.0)
MCV: 95.2 fL (ref 80.0–100.0)
Monocytes Absolute: 1.8 10*3/uL — ABNORMAL HIGH (ref 0.1–1.0)
Monocytes Relative: 7 %
Neutro Abs: 4.4 10*3/uL (ref 1.7–7.7)
Neutrophils Relative %: 17 %
Platelet Count: 172 10*3/uL (ref 150–400)
RBC: 4.13 MIL/uL (ref 3.87–5.11)
RDW: 12.1 % (ref 11.5–15.5)
Smear Review: NORMAL
WBC Count: 25.7 10*3/uL — ABNORMAL HIGH (ref 4.0–10.5)
nRBC: 0 % (ref 0.0–0.2)

## 2023-12-02 LAB — IRON AND IRON BINDING CAPACITY (CC-WL,HP ONLY)
Iron: 127 ug/dL (ref 28–170)
Saturation Ratios: 33 % — ABNORMAL HIGH (ref 10.4–31.8)
TIBC: 389 ug/dL (ref 250–450)
UIBC: 262 ug/dL (ref 148–442)

## 2023-12-02 LAB — FERRITIN: Ferritin: 175 ng/mL (ref 11–307)

## 2023-12-02 LAB — LACTATE DEHYDROGENASE: LDH: 137 U/L (ref 98–192)

## 2023-12-09 ENCOUNTER — Inpatient Hospital Stay: Payer: Medicare HMO | Admitting: Hematology and Oncology

## 2023-12-09 ENCOUNTER — Other Ambulatory Visit: Payer: Medicare HMO

## 2023-12-09 VITALS — BP 132/55 | HR 51 | Temp 97.5°F | Resp 14 | Wt 162.5 lb

## 2023-12-09 DIAGNOSIS — D500 Iron deficiency anemia secondary to blood loss (chronic): Secondary | ICD-10-CM

## 2023-12-09 DIAGNOSIS — C9110 Chronic lymphocytic leukemia of B-cell type not having achieved remission: Secondary | ICD-10-CM

## 2023-12-09 NOTE — Progress Notes (Signed)
 [facility] [phone]   [phone]  PROGRESS NOTE  Patient Care Team: Leesa Pulling, MD as PCP - General (Family Medicine) Gerard Knight, MD as PCP - Cardiology (Cardiology) Gerard Knight, MD as Consulting Physician (Cardiology) Faustina Hood, MD as Consulting Physician (Pulmonary Disease) Dr. 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 Rosaline Coma and [facility] Hearing 12/24/2021: flow cytometry confirms lymphocytosis is CLL.   Interval History:  [name] 73 y.o. female with medical history significant for iron  deficiency anemia due to GI blood loss and CLL who presents for a follow up visit. The patient's last visit was on 09/08/2023. In the interim since the last visit, she has had no major changes in her health.  On exam today Mrs. Kantner reports she continues to have a lot of chronic back pain.  She reports that she also cannot walk very far due to shortness of breath and fatigue.  Since he is quite prone to infections and reports that since she had a pneumonia 2 years ago she still has not yet quite been right.  She has had C. difficile and UTIs since that time.  She reports that if she exerts himself too much she feels "whipped".  She reports that her appetite has been good though the taste of food has been off lately.  She knows she has had no trouble with bleeding, bruising, or dark stools.  She reports that her hemorrhoids are quite painful but not having a lot of  bleeding.  She reports that she is having some abdominal cramps on occasion.  She is not having any enlarged lymph nodes anywhere.  She otherwise denies any fevers, chills, sweats.  A full 10 point ROS is otherwise negative.  MEDICAL HISTORY:  Past Medical History:  Diagnosis Date   Anxiety    Arthritis    Asthma    Barrett esophagus    Chronic bronchitis (HCC)    Chronic diarrhea    Chronic lower back pain    CLL (chronic lymphocytic leukemia) (HCC) 01/11/2022   Coronary atherosclerosis of native coronary artery    a. s/p DES to LAD in 2014 b. cath in 12/2017 showing patent LAD stent with mild to moderate RCA and LCx disease c. cath in 04/2021 showing patent LAD stent with 60-70% RCA stenosis but not significant by FFR   Diverticulosis    Essential hypertension    Fatty liver    Fibromyalgia    GERD (gastroesophageal reflux disease)    H/O hiatal hernia    IBS (irritable bowel syndrome)    Major depression in partial remission (HCC)    Migraines    Mixed hyperlipidemia    Myocardial infarction (HCC)    2014   OSA on CPAP    Osteoporosis    Pancreatitis 2008   Pneumonia 1990's   Schatzki's ring    Sleep apnea    Type 2 diabetes mellitus (HCC)     SURGICAL HISTORY: Past Surgical History:  Procedure Laterality Date  24 HOUR PH STUDY N/A 02/16/2018   Procedure: 24 HOUR PH STUDY;  Surgeon: Sergio Dandy, MD;  Location: [facility];  Service: Endoscopy;  Laterality: N/A;   ABDOMINAL HYSTERECTOMY  ?1987   APPENDECTOMY  ?1987   BIOPSY  03/02/2021   Procedure: BIOPSY;  Surgeon: Annis Kinder, DO;  Location: [facility];  Service: Gastroenterology;;   BUNIONECTOMY Bilateral 1972   CARPAL TUNNEL RELEASE Bilateral ?1990   CATARACT EXTRACTION W/ INTRAOCULAR LENS  IMPLANT, BILATERAL Bilateral ?2005   CESAREAN SECTION  1985   CHOLECYSTECTOMY  1990'S   COLONOSCOPY  10/13   Dr. Alline Ivans - tubular adenomas and diverticulosis   COLONOSCOPY WITH PROPOFOL  N/A 08/28/2021    Procedure: COLONOSCOPY WITH PROPOFOL ;  Surgeon: Normie Becton., MD;  Location: [facility] ENDOSCOPY;  Service: Gastroenterology;  Laterality: N/A;  ultra slim scope   CORONARY PRESSURE/FFR STUDY N/A 05/12/2021   Procedure: INTRAVASCULAR PRESSURE WIRE/FFR STUDY;  Surgeon: Sammy Crisp, MD;  [facility]: [facility];  Service: Cardiovascular;  Laterality: N/A;   DILATION AND CURETTAGE OF UTERUS  1970's   "probably 3" (12/09/2012)   ERCP  2008   ESOPHAGEAL DILATION  ` 2012   ESOPHAGEAL MANOMETRY N/A 10/21/2015   Procedure: ESOPHAGEAL MANOMETRY (EM);  Surgeon: Albertina Hugger, MD;  Location: [facility];  Service: Gastroenterology;  Laterality: N/A;   ESOPHAGEAL MANOMETRY N/A 02/16/2018   Procedure: ESOPHAGEAL MANOMETRY (EM);  Surgeon: Sergio Dandy, MD;  Location: [facility];  Service: Endoscopy;  Laterality: N/A;   ESOPHAGOGASTRODUODENOSCOPY Left 03/02/2021   Procedure: ESOPHAGOGASTRODUODENOSCOPY (EGD);  Surgeon: Annis Kinder, DO;  Location: [facility] ENDOSCOPY;  Service: Gastroenterology;  Laterality: Left;   HUMERUS FRACTURE SURGERY Right 07/19/1969   "horst threw me" (12/09/2012)   LEFT HEART CATH AND CORONARY ANGIOGRAPHY N/A 12/29/2017   Procedure: LEFT HEART CATH AND CORONARY ANGIOGRAPHY;  Surgeon: Wenona Hamilton, MD;  [facility]: [facility];  Service: Cardiovascular;  Laterality: N/A;   LEFT HEART CATH AND CORONARY ANGIOGRAPHY N/A 05/12/2021   Procedure: LEFT HEART CATH AND CORONARY ANGIOGRAPHY;  Surgeon: Sammy Crisp, MD;  [facility]: [facility];  Service: Cardiovascular;  Laterality: N/A;   NASAL SEPTUM SURGERY  1970's   PERCUTANEOUS CORONARY STENT INTERVENTION (PCI-S) N/A 12/09/2012   Procedure: PERCUTANEOUS CORONARY STENT INTERVENTION (PCI-S);  Surgeon: Odie Benne, MD;  Location[facility] C[facility] LAB;  Service: Cardiovascular;  Laterality: N/A;   PH IMPEDANCE STUDY N/A 02/16/2018   Procedure: PH IMPEDANCE STUDY;  Surgeon: Sergio Dandy, MD;   Location: [facility];  Service: Endoscopy;  Laterality: N/A;   TOE FUSION Right 10/11   First MTP joint   TOE SURGERY Right 1990's   "toe next to big toe:  dr said I had tumor; cut bone & stuff; another OR dr stretched tendons, etc" (12/09/2012)   TUBAL LIGATION  1985    SOCIAL HISTORY: Social History   Socioeconomic History   Marital status: Married    Spouse name: [name]   Number of children: 2   Years of education: Not on file   Highest education level: Not on file  Occupational History   Occupation: Retired  [employer]  Tobacco Use   Smoking status: Former    Current packs/day: 0.00    Average packs/day: 0.8 packs/day for 17.0 years (13.6 ttl pk-yrs)    Types: Cigarettes    Start date: 07/20/1976    Quit date: 07/20/1993    Years since quitting: 30.4   Smokeless tobacco: Never  Vaping  Use   Vaping status: Never Used  Substance and Sexual Activity   Alcohol  use: No    Alcohol /week: 0.0 standard drinks of alcohol    Drug use: No   Sexual activity: Yes  Other Topics Concern   Not on file  Social History Narrative   Not on file   Social Drivers of Health   Financial Resource Strain: Not on file  Food Insecurity: Not on file  Transportation Needs: Not on file  Physical Activity: Not on file  Stress: Not on file  Social Connections: Not on file  Intimate Partner Violence: Not on file    FAMILY HISTORY: Family History  Problem Relation Age of Onset   Stroke Mother    Cirrhosis Father    Hypertension Father    Esophageal cancer Sister    Colon polyps Sister        pre-cancerous, part of colon removed   Prostate cancer Brother    Colon cancer Neg Hx    Rectal cancer Neg Hx    Stomach cancer Neg Hx     ALLERGIES:  is allergic to erythromycin base, azithromycin, pollen extract, remeron [mirtazapine], zolpidem tartrate, other, sulfonamide derivatives, and tree extract.  MEDICATIONS:  Current Outpatient Medications  Medication Sig Dispense Refill   albuterol   (PROVENTIL  HFA;VENTOLIN  HFA) 108 (90 Base) MCG/ACT inhaler Inhale 2 puffs into the lungs every 4 (four) hours as needed for shortness of breath (Asthma).  3   ALPRAZolam  (XANAX ) 1 MG tablet Take 1 mg by mouth at bedtime.  1   atorvastatin  (LIPITOR ) 80 MG tablet Take 1 tablet (80 mg total) by mouth daily. 90 tablet 3   BIOTIN PO Take 1 tablet by mouth daily.     cetirizine (ZYRTEC) 10 MG tablet Take 10 mg by mouth daily as needed for allergies.     Cholecalciferol (VITAMIN D -3) 125 MCG (5000 UT) TABS Take 10,000 Units by mouth daily.     dicyclomine  (BENTYL ) 10 MG capsule TAKE 1 CAPSULE (10 MG TOTAL) BY MOUTH 3 (THREE) TIMES DAILY BEFORE MEALS. 270 capsule 1   diltiazem  (CARDIZEM  CD) 120 MG 24 hr capsule TAKE 1 CAPSULE (120 MG TOTAL) BY MOUTH EVERY MORNING 90 capsule 1   diphenoxylate -atropine  (LOMOTIL ) 2.5-0.025 MG tablet Take 1 tablet by mouth 3 (three) times daily as needed for diarrhea or loose stools. 30 tablet 0   DULoxetine  (CYMBALTA ) 60 MG capsule Take 60 mg by mouth daily.     HYDROcodone -acetaminophen  (NORCO/VICODIN) 5-325 MG tablet Take 1 tablet by mouth 2 (two) times daily.  0   isosorbide  mononitrate (IMDUR ) 60 MG 24 hr tablet TAKE 1 TABLET BY MOUTH EVERY DAY 90 tablet 3   losartan -hydrochlorothiazide  (HYZAAR) 100-25 MG tablet TAKE 1 TABLET BY MOUTH EVERY DAY 90 tablet 3   metoprolol  tartrate (LOPRESSOR ) 25 MG tablet TAKE 1 TABLET BY MOUTH TWICE A DAY 180 tablet 3   MISC NATURAL PRODUCTS PO Take 1 tablet by mouth daily at 6 (six) AM. Renew Liver     montelukast  (SINGULAIR ) 10 MG tablet Take 10 mg by mouth at bedtime.      nitroGLYCERIN  (NITROSTAT ) 0.4 MG SL tablet Place 1 tablet (0.4 mg total) under the tongue every 5 (five) minutes x 3 doses as needed for chest pain (if no relief after 3rd dose, proceed to ED or call 911). 25 tablet 3   NOVOLOG  FLEXPEN 100 UNIT/ML FlexPen Inject 0-30 Units into the skin 3 (three) times daily as needed for high blood sugar (If blood glucose  is above  200).     omeprazole  (PRILOSEC) 40 MG capsule TAKE 1 CAPSULE BY MOUTH TWICE A DAY 180 capsule 3   ondansetron  (ZOFRAN ) 4 MG tablet TAKE 1 TABLET BY MOUTH EVERY 8 HOURS AS NEEDED FOR NAUSEA AND VOMITING (Patient taking differently: Taking 1/2 at bedtime) 45 tablet 2   ONETOUCH VERIO test strip 1 each 3 (three) times daily.     promethazine  (PHENERGAN ) 25 MG tablet Take 25 mg by mouth every 6 (six) hours as needed for nausea or vomiting.     traZODone  (DESYREL ) 50 MG tablet Take 150 mg by mouth at bedtime.     TRESIBA  FLEXTOUCH 200 UNIT/ML FlexTouch Pen Inject 40 Units into the skin daily. As directed (Patient taking differently: Inject 45 Units into the skin 2 (two) times daily. As directed Between novolog )     No current facility-administered medications for this visit.    REVIEW OF SYSTEMS:   Constitutional: ( - ) fevers, ( - )  chills , ( + ) night sweats Eyes: ( - ) blurriness of vision, ( - ) double vision, ( - ) watery eyes Ears, nose, mouth, throat, and face: ( - ) mucositis, ( - ) sore throat Respiratory: ( - ) cough, ( - ) dyspnea, ( - ) wheezes Cardiovascular: ( - ) palpitation, ( + ) chest discomfort, ( - ) lower extremity swelling Gastrointestinal:  ( + ) nausea, ( - ) heartburn, ( + ) change in bowel habits Skin: ( - ) abnormal skin rashes Lymphatics: ( - ) new lymphadenopathy, ( - ) easy bruising Neurological: ( - ) numbness, ( - ) tingling, ( - ) new weaknesses Behavioral/Psych: ( - ) mood change, ( - ) new changes  All other systems were reviewed with the patient and are negative.  PHYSICAL EXAMINATION: Not performed due to virtual visit.   LABORATORY DATA:  I have reviewed the data as listed    Latest Ref Rng & Units 12/02/2023   10:44 AM 09/02/2023   10:48 AM 06/21/2023   10:21 AM  CBC  WBC 4.0 - 10.5 K/uL 25.7  21.4  17.5   Hemoglobin 12.0 - 15.0 g/dL 16.1  09.6  04.5   Hematocrit 36.0 - 46.0 % 39.3  40.1  37.9   Platelets 150 - 400 K/uL 172  182  229         Latest Ref Rng & Units 12/02/2023   10:44 AM 09/02/2023   10:48 AM 06/21/2023   10:21 AM  CMP  Glucose 70 - 99 mg/dL 409  811  914   BUN 8 - 23 mg/dL 10  13  12    Creatinine 0.44 - 1.00 mg/dL 7.82  9.56  2.13   Sodium 135 - 145 mmol/L 135  130  135   Potassium 3.5 - 5.1 mmol/L 4.1  4.5  3.4   Chloride 98 - 111 mmol/L 99  95  99   CO2 22 - 32 mmol/L 33  31  30   Calcium  8.9 - 10.3 mg/dL 9.6  9.8  9.8   Total Protein 6.5 - 8.1 g/dL 7.0  7.1  7.0   Total Bilirubin 0.0 - 1.2 mg/dL 0.7  0.6  0.6   Alkaline Phos 38 - 126 U/L 98  115  107   AST 15 - 41 U/L 27  30  18    ALT 0 - 44 U/L 27  41  16     RADIOGRAPHIC STUDIES: No results  found.  ASSESSMENT & PLAN TIWANNA TUCH 73 y.o. female with medical history significant for iron  deficiency anemia due to GI blood loss and newly diagnosed CLL who presents for a follow up visit.  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 Due to GI intolerance, okay to discontinue --Last received IV venofer  200 mg x 5 doses from 07/29/2023-08/26/2023. --Labs from 09/02/2023 show no evidence of anemia with Hgb 13.6, MCV 95.0. Iron  panel shows no deficiency with iron  113, TIBC 371, saturation 31%, ferritin 210. --No need for additional IV iron  at this time --Continue incorporate iron  rich foods  into diet.  --Repeat iron  panel in 3 months.    #CLL Rai Stage 0: --There is evidence of mild lymphocytosis that has been present since August 2022.  -- flow cytometry from June 2023 confirmed presence of CLL. --Labs show white blood cell 25.7, hemoglobin 13.4, MCV 95.2, platelets 172 --Patient does night sweats otherwise no other B symptoms.  --Indications for CLL treatment noted below. --No clear indication to start treatment at this time.  Recommend observation.   --Repeat labs in 3 months.   #C.diff infection: --Currently on vancomycin  therapy managed by GI.  --Reports diarrhea with cramping is  improving.   #Vitamin B12 deficiency: --Currently taking vitamin B12 supplementation twice a day.  --B12 level checked on 09/02/2023 measured 5,037.  --Advised to decrease supplementation to once daily.   No orders of the defined types were placed in this encounter.  All questions were answered. The patient knows to call the clinic with any problems, questions or concerns.  A total of more than 30 minutes were spent on this encounter with non-face-to-face time, including preparing to see the patient, ordering tests and/or medications, counseling the patient and coordination of care as outlined above.   Rogerio Clay, MD Department of Hematology/Oncology [facility] Phone: [phone] Pager: [phone] Email: [email or web address]    12/11/2023 7:23 PM  Armando Lance BD, Catovsky D, Caligaris-Cappio F, Dighiero G, Dhner H, Hillmen P, Keating M, Montserrat E, Chiorazzi N, Stilgenbauer S, Rai KR, [place], Eichhorst B, O'Brien S, Robak T, Seymour JF, Kipps TJ. iwCLL guidelines for diagnosis, indications for treatment, response assessment, and supportive management of CLL. Blood. 2018 Jun 21;131(25):2745-2760.  Active disease should be clearly documented to initiate therapy. At least 1 of the following criteria should be met.  1) Evidence of progressive marrow failure as manifested by the development of, or worsening of, anemia and/or thrombocytopenia. Cutoff levels of Hb <10 g/dL or platelet counts <841  109/L are generally regarded as indication for treatment. However, in some patients, platelet counts <100  109/L may remain stable over a long period; this situation does not automatically require therapeutic intervention. 2) Massive (ie, >=6 cm below the left costal margin) or progressive or symptomatic splenomegaly. 3) Massive nodes (ie, >=10 cm in longest diameter) or progressive or symptomatic lymphadenopathy. 4) Progressive  lymphocytosis with an increase of >=50% over a 87-month period, or lymphocyte doubling time (LDT) <6 months. LDT can be obtained by linear regression extrapolation of absolute lymphocyte counts obtained at intervals of 2 weeks over an observation period of 2 to 3 months; patients with initial blood lymphocyte counts <30  109/L may require a longer observation period to determine the LDT. Factors contributing to lymphocytosis other than CLL (eg, infections, steroid administration) should be excluded. 5) Autoimmune complications including anemia or thrombocytopenia poorly responsive  to corticosteroids. 6) Symptomatic or functional extranodal involvement (eg, skin, kidney, lung, spine). Disease-related symptoms as defined by any of the following: Unintentional weight loss >=10% within the previous 6 months. Significant fatigue (ie, ECOG performance scale 2 or worse; cannot work or unable to perform usual activities). Fevers >=100.21F or 38.0C for 2 or more weeks without evidence of infection. Night sweats for >=1 month without evidence of infection.

## 2023-12-11 ENCOUNTER — Encounter: Payer: Self-pay | Admitting: Physician Assistant

## 2023-12-23 ENCOUNTER — Ambulatory Visit: Payer: Medicare HMO | Attending: Cardiology | Admitting: Cardiology

## 2023-12-23 ENCOUNTER — Encounter: Payer: Self-pay | Admitting: Cardiology

## 2023-12-23 VITALS — BP 132/60 | HR 52 | Ht 61.0 in | Wt 156.0 lb

## 2023-12-23 DIAGNOSIS — I25118 Atherosclerotic heart disease of native coronary artery with other forms of angina pectoris: Secondary | ICD-10-CM

## 2023-12-23 DIAGNOSIS — I10 Essential (primary) hypertension: Secondary | ICD-10-CM | POA: Diagnosis not present

## 2023-12-23 DIAGNOSIS — I358 Other nonrheumatic aortic valve disorders: Secondary | ICD-10-CM | POA: Diagnosis not present

## 2023-12-23 DIAGNOSIS — E782 Mixed hyperlipidemia: Secondary | ICD-10-CM | POA: Diagnosis not present

## 2023-12-23 DIAGNOSIS — I25119 Atherosclerotic heart disease of native coronary artery with unspecified angina pectoris: Secondary | ICD-10-CM

## 2023-12-23 NOTE — Patient Instructions (Addendum)

## 2023-12-23 NOTE — Progress Notes (Signed)
    Cardiology Office Note  Date: 12/23/2023   ID: [name], DOB 04/28/1951, MRN [id number]  History of Present Illness: [name] is a 73 y.o. female last seen in November 2024.  She is here with her husband for a follow-up visit.  Reports stable angina, improved with a single nitroglycerin .  No increasing symptoms or worsening dyspnea on exertion.  She has been having a lot of trouble with lower back and hip pain that limits activity.  States that she has had some steroid injections per PCP.  We went over her medications.  No change from a cardiac perspective.  I reviewed her interval lab work which is noted below.  Blood pressure rechecked by me 132/60.  She does track blood pressure at home.  I reviewed her ECG today which shows sinus bradycardia with incomplete right bundle branch block.  Physical Exam: VS:  BP 132/60 (BP Location: Right Arm)   Pulse (!) 52   Ht 5\' 1"  (1.549 m)   Wt 156 lb (70.8 kg)   SpO2 97%   BMI 29.48 kg/m , BMI Body mass index is 29.48 kg/m.  Wt Readings from Last 3 Encounters:  12/23/23 156 lb (70.8 kg)  12/09/23 162 lb 8 oz (73.7 kg)  09/21/23 161 lb (73 kg)    General: Patient appears comfortable at rest. HEENT: Conjunctiva and lids normal. Neck: Supple, no elevated JVP or carotid bruits. Lungs: Clear to auscultation, nonlabored breathing at rest. Cardiac: Regular rate and rhythm, no S3, 2/6 systolic murmur.  ECG:  An ECG dated 12/04/2022 was personally reviewed today and demonstrated:  Sinus bradycardia.  Labwork: November 2024: Cholesterol 146, triglycerides 128, HDL 51, LDL 69 09/21/2023: TSH 1.36 0/86/5784: ALT 27; AST 27; BUN 10; Creatinine 0.75; Hemoglobin 13.4; Platelet Count 172; Potassium 4.1; Sodium 135   Other Studies Reviewed Today:  No interval cardiac testing for review today.  Assessment and Plan:  1.  CAD status post DES to the LAD in 2014 with moderate mid RCA stenosis that was not hemodynamically significant by RFR  evaluation in October 2022.  LVEF 65 to 70% by echocardiogram in July 2024.  She reports stable angina on current regimen.  ECG reviewed.  No longer on aspirin  in the setting of recurrent iron  deficiency anemia in the setting of CLL and chronic GI blood loss.  Hemoglobin has been stable.  Continue Imdur  60 mg daily, Lopressor  25 mg twice daily, Lipitor  80 mg daily, and as needed nitroglycerin .   2.  Mixed hyperlipidemia.  Last LDL 69 on Lipitor  80 mg daily.   3.  Primary hypertension.  No changes to current regimen as outlined above, she is also on Hyzaar 100/25 mg daily and Cardizem  CD 120 mg daily.   4.  Aortic valve sclerosis without definite stenosis by recent follow-up echocardiogram in July 2024.  Mean AV gradient approximately 6 mmHg.  Disposition:  Follow up 6 months.  Signed, Gerard Knight, M.D., F.A.C.C. [facility]

## 2024-01-02 ENCOUNTER — Other Ambulatory Visit: Payer: Self-pay | Admitting: Gastroenterology

## 2024-01-05 ENCOUNTER — Other Ambulatory Visit: Payer: Medicare HMO

## 2024-01-05 ENCOUNTER — Ambulatory Visit: Payer: Medicare HMO | Admitting: Hematology and Oncology

## 2024-01-09 ENCOUNTER — Other Ambulatory Visit: Payer: Self-pay | Admitting: Cardiology

## 2024-01-16 ENCOUNTER — Other Ambulatory Visit: Payer: Self-pay | Admitting: Cardiology

## 2024-01-25 ENCOUNTER — Other Ambulatory Visit: Payer: Self-pay | Admitting: Gastroenterology

## 2024-01-25 ENCOUNTER — Other Ambulatory Visit: Payer: Self-pay | Admitting: Cardiology

## 2024-02-21 ENCOUNTER — Encounter: Payer: Self-pay | Admitting: Family Medicine

## 2024-03-09 ENCOUNTER — Inpatient Hospital Stay: Attending: Hematology and Oncology

## 2024-03-09 DIAGNOSIS — D500 Iron deficiency anemia secondary to blood loss (chronic): Secondary | ICD-10-CM | POA: Diagnosis present

## 2024-03-09 DIAGNOSIS — C9110 Chronic lymphocytic leukemia of B-cell type not having achieved remission: Secondary | ICD-10-CM | POA: Diagnosis present

## 2024-03-09 LAB — CMP (CANCER CENTER ONLY)
ALT: 19 U/L (ref 0–44)
AST: 23 U/L (ref 15–41)
Albumin: 4 g/dL (ref 3.5–5.0)
Alkaline Phosphatase: 96 U/L (ref 38–126)
Anion gap: 4 — ABNORMAL LOW (ref 5–15)
BUN: 7 mg/dL — ABNORMAL LOW (ref 8–23)
CO2: 31 mmol/L (ref 22–32)
Calcium: 9.3 mg/dL (ref 8.9–10.3)
Chloride: 104 mmol/L (ref 98–111)
Creatinine: 0.61 mg/dL (ref 0.44–1.00)
GFR, Estimated: >60 mL/min (ref >60)
Glucose, Bld: 194 mg/dL — ABNORMAL HIGH (ref 70–99)
Potassium: 3.9 mmol/L (ref 3.5–5.1)
Sodium: 139 mmol/L (ref 135–145)
Total Bilirubin: 0.5 mg/dL (ref 0.0–1.2)
Total Protein: 6.3 g/dL — ABNORMAL LOW (ref 6.5–8.1)

## 2024-03-09 LAB — IRON AND IRON BINDING CAPACITY (CC-WL,HP ONLY)
Iron: 63 ug/dL (ref 28–170)
Saturation Ratios: 15 % (ref 10.4–31.8)
TIBC: 414 ug/dL (ref 250–450)
UIBC: 351 ug/dL (ref 148–442)

## 2024-03-09 LAB — CBC WITH DIFFERENTIAL (CANCER CENTER ONLY)
Abs Immature Granulocytes: 0.04 K/uL (ref 0.00–0.07)
Basophils Absolute: 0.1 K/uL (ref 0.0–0.1)
Basophils Relative: 0 %
Eosinophils Absolute: 0.4 K/uL (ref 0.0–0.5)
Eosinophils Relative: 1 %
HCT: 36.8 % (ref 36.0–46.0)
Hemoglobin: 12.3 g/dL (ref 12.0–15.0)
Immature Granulocytes: 0 %
Lymphocytes Relative: 76 %
Lymphs Abs: 20.1 K/uL — ABNORMAL HIGH (ref 0.7–4.0)
MCH: 33.2 pg (ref 26.0–34.0)
MCHC: 33.4 g/dL (ref 30.0–36.0)
MCV: 99.2 fL (ref 80.0–100.0)
Monocytes Absolute: 1.7 K/uL — ABNORMAL HIGH (ref 0.1–1.0)
Monocytes Relative: 7 %
Neutro Abs: 4.3 K/uL (ref 1.7–7.7)
Neutrophils Relative %: 16 %
Platelet Count: 185 K/uL (ref 150–400)
RBC: 3.71 MIL/uL — ABNORMAL LOW (ref 3.87–5.11)
RDW: 12.2 % (ref 11.5–15.5)
WBC Count: 26.6 K/uL — ABNORMAL HIGH (ref 4.0–10.5)
nRBC: 0 % (ref 0.0–0.2)

## 2024-03-09 LAB — FERRITIN: Ferritin: 42 ng/mL (ref 11–307)

## 2024-03-09 LAB — FOLATE: Folate: 20.1 ng/mL (ref >5.9)

## 2024-03-09 LAB — VITAMIN B12: Vitamin B-12: 1172 pg/mL — ABNORMAL HIGH (ref 180–914)

## 2024-03-14 ENCOUNTER — Ambulatory Visit: Payer: Self-pay

## 2024-05-11 ENCOUNTER — Telehealth: Payer: Self-pay | Admitting: Cardiology

## 2024-05-11 NOTE — Telephone Encounter (Signed)
 Replied via mychart.

## 2024-05-11 NOTE — Telephone Encounter (Signed)
 Patient sent over a message thru My Chart saying that she is having issues with her BP staying high.  I offered her an appointment on Friday. 05-12-24 with Almarie Crate, NP. She is unable to take this appointment because she has an eye doctor appointment.She was unable to give BP readings at the moment. Patient was informed that we would send a message to our Triage nurse for further evaluation.

## 2024-05-16 ENCOUNTER — Encounter: Payer: Self-pay | Admitting: Cardiology

## 2024-05-16 ENCOUNTER — Ambulatory Visit: Attending: Cardiology | Admitting: Cardiology

## 2024-05-16 VITALS — BP 164/72 | HR 55 | Ht 62.0 in | Wt 160.8 lb

## 2024-05-16 DIAGNOSIS — I10 Essential (primary) hypertension: Secondary | ICD-10-CM

## 2024-05-16 DIAGNOSIS — I25119 Atherosclerotic heart disease of native coronary artery with unspecified angina pectoris: Secondary | ICD-10-CM

## 2024-05-16 DIAGNOSIS — E782 Mixed hyperlipidemia: Secondary | ICD-10-CM | POA: Diagnosis not present

## 2024-05-16 MED ORDER — AMLODIPINE BESYLATE 2.5 MG PO TABS: 2.5000 mg | ORAL_TABLET | Freq: Every day | ORAL | 1 refills | Status: DC

## 2024-05-16 NOTE — Progress Notes (Signed)
 Cardiology Office Note  Date: 05/16/2024   ID: [name], DOB Jun 30, 1951, MRN [id number]  History of Present Illness: [name] is a 73 y.o. female last seen in June.  She is here for a follow-up visit.  Reports trouble with hypertension based on home checks and corroborated by today's office visit.  She tells me that she was switched from Hyzaar to losartan  by her PCP back during the summer due to issues with hyponatremia, although has subsequently gone back on Hyzaar 100/25 mg daily due to the development of leg edema which has now resolved.  Follow-up sodium in August was normal at 139.  She does not report any angina at this time.  Heart rates in the 50s on combination of Lopressor  25 mg twice daily and Cardizem  CD 120 mg daily.  I went over her lab work which is noted below.  LDL was 62 in July and she has continued on Lipitor  80 mg daily.  I reviewed her ECG today which shows sinus bradycardia at 51 bpm.  Physical Exam: VS:  BP (!) 164/72 Comment: her monitor  Pulse (!) 55   Ht 5' 2 (1.575 m)   Wt 160 lb 12.8 oz (72.9 kg)   SpO2 96%   BMI 29.41 kg/m , BMI Body mass index is 29.41 kg/m.  Wt Readings from Last 3 Encounters:  05/16/24 160 lb 12.8 oz (72.9 kg)  12/23/23 156 lb (70.8 kg)  12/09/23 162 lb 8 oz (73.7 kg)    General: Patient appears comfortable at rest. HEENT: Conjunctiva and lids normal. Neck: Supple, no elevated JVP or carotid bruits. Lungs: Clear to auscultation, nonlabored breathing at rest. Cardiac: Regular rate and rhythm, no S3, 2/6 systolic murmur. Extremities: No pitting edema.  ECG:  An ECG dated 12/23/2023 was personally reviewed today and demonstrated:  Sinus bradycardia with incomplete right branch block.  Labwork: 09/21/2023: TSH 1.36 03/09/2024: ALT 19; AST 23; BUN 7; Creatinine 0.61; Hemoglobin 12.3; Platelet Count 185; Potassium 3.9; Sodium 139  July 2025: Cholesterol 153, triglycerides 99, HDL 71, LDL 62  Other Studies Reviewed  Today:  No interval cardiac testing for review today.  Assessment and Plan:  1.  Primary hypertension.  Blood pressure not optimally controlled.  Plan to stop Cardizem  CD and replace it with Norvasc  2.5 mg daily to start.  Will likely need up titration but want to make sure she tolerates this in terms of peripheral edema.  Otherwise continue Hyzaar 100/25 mg daily and Lopressor  25 mg twice daily.  Arrange follow-up with Pharm.D. for further titration in the next few weeks.   2.  CAD status post DES to the LAD in 2014 with moderate mid RCA stenosis that was not hemodynamically significant by RFR evaluation in October 2022.  LVEF 65 to 70% by echocardiogram in July 2024.  She does not report any accelerating angina at this time.  No longer on aspirin  in the setting of recurrent iron  deficiency anemia in the setting of CLL and chronic GI blood loss.  Continue Imdur  60 mg daily and Lipitor  80 mg daily.  Norvasc  may also serve as antianginal.   2.  Mixed hyperlipidemia.  LDL 62 and HDL 71 in July.  Continue Lipitor  80 mg daily.   4.  Aortic valve sclerosis without definite stenosis by recent follow-up echocardiogram in July 2024.  Mean AV gradient approximately 6 mmHg.  Disposition:  Follow up 6 months cardiology clinic.  Signed, Jayson JUDITHANN Sierras, M.D., F.A.C.C. [facility]

## 2024-05-16 NOTE — Patient Instructions (Addendum)
 Medication Instructions:  Your physician has recommended you make the following change in your medication:  Stop diltiazem  Start amlodipine  2.5 mg daily Continue all other medications as prescribed  Labwork: none  Testing/Procedures: none  Follow-Up: Your physician recommends that you schedule a follow-up appointment in: 6 months  Any Other Special Instructions Will Be Listed Below (If Applicable). You have been referred to PharmD  If you need a refill on your cardiac medications before your next appointment, please call your pharmacy.

## 2024-05-17 ENCOUNTER — Telehealth: Payer: Self-pay | Admitting: Gastroenterology

## 2024-05-17 DIAGNOSIS — K7460 Unspecified cirrhosis of liver: Secondary | ICD-10-CM

## 2024-05-17 NOTE — Telephone Encounter (Signed)
 Patient inquiring if she should have her biannual ultrasound prior to her OV with you in December.  Please advise.  Thank you.

## 2024-05-17 NOTE — Telephone Encounter (Signed)
 Inbound call from patient stating she is scheduled for an appointment 07/05/24 2 pm and would like to know if she has to wait until that appointment to have her ultrasound or can she have it done before. Requesting a call back  Please advise  Thank you

## 2024-05-17 NOTE — Telephone Encounter (Signed)
 Thanks for the note.  Yes, she can have the ultrasound done any time that is convenient.  RUQ U/S for cirrhosis, HCC screening.  - H. Danis

## 2024-05-22 NOTE — Telephone Encounter (Signed)
 Orders placed and patient notified.  Phone number for central scheduling provided.  Patient knows so make appointment at least one week prior to her OV with Dr Legrand.

## 2024-06-01 ENCOUNTER — Other Ambulatory Visit: Payer: Self-pay | Admitting: Hematology and Oncology

## 2024-06-01 ENCOUNTER — Inpatient Hospital Stay: Attending: Hematology and Oncology

## 2024-06-01 DIAGNOSIS — D500 Iron deficiency anemia secondary to blood loss (chronic): Secondary | ICD-10-CM | POA: Insufficient documentation

## 2024-06-01 DIAGNOSIS — K2970 Gastritis, unspecified, without bleeding: Secondary | ICD-10-CM | POA: Diagnosis not present

## 2024-06-01 DIAGNOSIS — Z87891 Personal history of nicotine dependence: Secondary | ICD-10-CM | POA: Insufficient documentation

## 2024-06-01 DIAGNOSIS — R61 Generalized hyperhidrosis: Secondary | ICD-10-CM | POA: Insufficient documentation

## 2024-06-01 DIAGNOSIS — C9110 Chronic lymphocytic leukemia of B-cell type not having achieved remission: Secondary | ICD-10-CM | POA: Diagnosis present

## 2024-06-01 LAB — CBC WITH DIFFERENTIAL (CANCER CENTER ONLY)
Abs Immature Granulocytes: 0.06 K/uL (ref 0.00–0.07)
Basophils Absolute: 0.1 K/uL (ref 0.0–0.1)
Basophils Relative: 0 %
Eosinophils Absolute: 0.3 K/uL (ref 0.0–0.5)
Eosinophils Relative: 1 %
HCT: 37.8 % (ref 36.0–46.0)
Hemoglobin: 12.8 g/dL (ref 12.0–15.0)
Immature Granulocytes: 0 %
Lymphocytes Relative: 79 %
Lymphs Abs: 27.4 K/uL — ABNORMAL HIGH (ref 0.7–4.0)
MCH: 31.8 pg (ref 26.0–34.0)
MCHC: 33.9 g/dL (ref 30.0–36.0)
MCV: 93.8 fL (ref 80.0–100.0)
Monocytes Absolute: 2.7 K/uL — ABNORMAL HIGH (ref 0.1–1.0)
Monocytes Relative: 8 %
Neutro Abs: 4.1 K/uL (ref 1.7–7.7)
Neutrophils Relative %: 12 %
Platelet Count: 173 K/uL (ref 150–400)
RBC: 4.03 MIL/uL (ref 3.87–5.11)
RDW: 11.9 % (ref 11.5–15.5)
Smear Review: NORMAL
WBC Count: 34.7 K/uL — ABNORMAL HIGH (ref 4.0–10.5)
nRBC: 0 % (ref 0.0–0.2)

## 2024-06-01 LAB — RETIC PANEL
Immature Retic Fract: 7.9 % (ref 2.3–15.9)
RBC.: 4.04 MIL/uL (ref 3.87–5.11)
Retic Count, Absolute: 58.2 K/uL (ref 19.0–186.0)
Retic Ct Pct: 1.4 % (ref 0.4–3.1)
Reticulocyte Hemoglobin: 33.9 pg (ref >27.9)

## 2024-06-01 LAB — IRON AND IRON BINDING CAPACITY (CC-WL,HP ONLY)
Iron: 169 ug/dL (ref 28–170)
Saturation Ratios: 38 % — ABNORMAL HIGH (ref 10.4–31.8)
TIBC: 447 ug/dL (ref 250–450)
UIBC: 278 ug/dL (ref 148–442)

## 2024-06-01 LAB — CMP (CANCER CENTER ONLY)
ALT: 20 U/L (ref 0–44)
AST: 23 U/L (ref 15–41)
Albumin: 4.1 g/dL (ref 3.5–5.0)
Alkaline Phosphatase: 85 U/L (ref 38–126)
Anion gap: 5 (ref 5–15)
BUN: 13 mg/dL (ref 8–23)
CO2: 31 mmol/L (ref 22–32)
Calcium: 9.8 mg/dL (ref 8.9–10.3)
Chloride: 96 mmol/L — ABNORMAL LOW (ref 98–111)
Creatinine: 0.75 mg/dL (ref 0.44–1.00)
GFR, Estimated: >60 mL/min (ref >60)
Glucose, Bld: 178 mg/dL — ABNORMAL HIGH (ref 70–99)
Potassium: 4.1 mmol/L (ref 3.5–5.1)
Sodium: 132 mmol/L — ABNORMAL LOW (ref 135–145)
Total Bilirubin: 0.6 mg/dL (ref 0.0–1.2)
Total Protein: 6.5 g/dL (ref 6.5–8.1)

## 2024-06-01 LAB — FERRITIN: Ferritin: 28 ng/mL (ref 11–307)
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
## 2024-06-09 ENCOUNTER — Inpatient Hospital Stay: Admitting: Physician Assistant

## 2024-06-09 VITALS — BP 128/52 | HR 58 | Temp 97.3°F | Resp 17 | Ht 62.0 in | Wt 159.0 lb

## 2024-06-09 DIAGNOSIS — C9110 Chronic lymphocytic leukemia of B-cell type not having achieved remission: Secondary | ICD-10-CM | POA: Diagnosis not present

## 2024-06-09 DIAGNOSIS — D500 Iron deficiency anemia secondary to blood loss (chronic): Secondary | ICD-10-CM

## 2024-06-16 ENCOUNTER — Other Ambulatory Visit: Payer: Self-pay | Admitting: Cardiology

## 2024-06-21 NOTE — Progress Notes (Unsigned)
 Patient ID: ANASTASYA JEWELL                 DOB: 1950-08-11                      MRN: [id number]      HPI: [name] is a 73 y.o. female referred by Dr. Debera to HTN clinic. PMH is significant for HTN, CAD (s/p DES to LAD in 2014), HLD, T2DM, and IBS.   Patient was last seen by cardiology provider in October 2025 for a HTN follow up visit. At that visit, patient reported elevated home BP readings and medication was switched from Hyzaar to losartan  by PCP due to issues with hyponatremia although she went back on Hyzaar 100/25 mg daily due to leg swelling that has since resolved. Follow up sodium in August was normal at 139. BP at that office visit was uncontrolled as well (164/72). Cardizem  was stopped and replaced with norvasc  2.5 mg daily, Hyzaar 100/25 and lopressor  25 twice daily was continued and she is to follow up with PharmD for further titration.  Patient presents today in good spirits. She is currently taking amlodipine  2.5 mg daily in the morning, losartan -hydrochlorothiazide  100 mg/25 mg daily in the morning, metoprolol  tartrate 25 mg twice daily. She states that she notices that her blood pressure is higher in the afternoons and lower in the mornings.  She presents with new blood pressure monitor which we found to read SBP ~20 points higher than office BP. She states that she is seeing a nutritionist to aid her in eating healthier and cut out extra sugar and salt in diet.   Office BP: 119/65 HR 55, 110/70 HR 58 Home monitor BP: 131/67 HR 57  Current HTN meds: amlodipine  2.5 mg daily, losartan -hydrochlorothiazide  100 mg/25 mg daily, metoprolol  tartrate 25 mg twice daily Previously tried: Cardizem   BP goal: < 130/80 Labs (05/2024): Na 132, K 4.1, Scr 0.75, Crcl 60 ml/min  Family History:  Relation Problem Comments  Mother (Deceased at age 25) Stroke     Father (Deceased at age 19) Cirrhosis   Hypertension     Sister (Deceased) Esophageal cancer     Sister Metallurgist) Colon  polyps pre-cancerous, part of colon removed    Brother Prostate cancer     Neg Hx Colon cancer   Rectal cancer   Stomach cancer      Social History:  Alcohol : none Smoking: none  Home BP readings:  SBP/DBP  HR  163/73 58  140/64 64  150/70 53  122/64 53  121/57 52  140/66 51  144/73 60    Wt Readings from Last 3 Encounters:  06/09/24 159 lb (72.1 kg)  05/16/24 160 lb 12.8 oz (72.9 kg)  12/23/23 156 lb (70.8 kg)   BP Readings from Last 3 Encounters:  06/22/24 110/70  06/09/24 (!) 128/52  05/16/24 (!) 164/72   Pulse Readings from Last 3 Encounters:  06/22/24 (!) 58  06/09/24 (!) 58  05/16/24 (!) 55    Renal function: CrCl cannot be calculated (Patient's most recent lab result is older than the maximum 21 days allowed.).  Past Medical History:  Diagnosis Date   Anxiety    Arthritis    Asthma    Barrett esophagus    Chronic bronchitis (HCC)    Chronic diarrhea    Chronic lower back pain    CLL (chronic lymphocytic leukemia) (HCC) 01/11/2022   Coronary atherosclerosis of native coronary artery  a. s/p DES to LAD in 2014 b. cath in 12/2017 showing patent LAD stent with mild to moderate RCA and LCx disease c. cath in 04/2021 showing patent LAD stent with 60-70% RCA stenosis but not significant by FFR   Diverticulosis    Essential hypertension    Fatty liver    Fibromyalgia    GERD (gastroesophageal reflux disease)    H/O hiatal hernia    IBS (irritable bowel syndrome)    Major depression in partial remission    Migraines    Mixed hyperlipidemia    Myocardial infarction (HCC)    2014   OSA on CPAP    Osteoporosis    Pancreatitis 2008   Pneumonia 1990's   Schatzki's ring    Sleep apnea    Type 2 diabetes mellitus (HCC)     Current Outpatient Medications on File Prior to Visit  Medication Sig Dispense Refill   amLODipine  (NORVASC ) 2.5 MG tablet Take 1 tablet (2.5 mg total) by mouth daily. 90 tablet 1   isosorbide  mononitrate (IMDUR ) 60 MG 24 hr  tablet TAKE 1 TABLET BY MOUTH EVERY DAY 90 tablet 3   albuterol  (PROVENTIL  HFA;VENTOLIN  HFA) 108 (90 Base) MCG/ACT inhaler Inhale 2 puffs into the lungs every 4 (four) hours as needed for shortness of breath (Asthma).  3   ALPRAZolam  (XANAX ) 1 MG tablet Take 1 mg by mouth at bedtime.  1   atorvastatin  (LIPITOR ) 80 MG tablet TAKE 1 TABLET BY MOUTH EVERY DAY 90 tablet 3   BIOTIN PO Take 1 tablet by mouth daily.     cetirizine (ZYRTEC) 10 MG tablet Take 10 mg by mouth daily as needed for allergies.     Cholecalciferol (VITAMIN D -3) 125 MCG (5000 UT) TABS Take 10,000 Units by mouth daily.     dicyclomine  (BENTYL ) 10 MG capsule TAKE 1 CAPSULE (10 MG TOTAL) BY MOUTH 3 (THREE) TIMES DAILY BEFORE MEALS. 270 capsule 0   diphenoxylate -atropine  (LOMOTIL ) 2.5-0.025 MG tablet Take 1 tablet by mouth 3 (three) times daily as needed for diarrhea or loose stools. 30 tablet 0   DULoxetine  (CYMBALTA ) 60 MG capsule Take 60 mg by mouth daily.     HYDROcodone -acetaminophen  (NORCO/VICODIN) 5-325 MG tablet Take 1 tablet by mouth 2 (two) times daily.  0   metoprolol  tartrate (LOPRESSOR ) 25 MG tablet TAKE 1 TABLET BY MOUTH TWICE A DAY 180 tablet 3   MISC NATURAL PRODUCTS PO Take 1 tablet by mouth daily at 6 (six) AM. Renew Liver     montelukast  (SINGULAIR ) 10 MG tablet Take 10 mg by mouth at bedtime.      nitroGLYCERIN  (NITROSTAT ) 0.4 MG SL tablet PLACE 1 TABLET UNDER THE TONGUE EVERY 5 MINUTES X 3 DOSES AS NEEDED FOR CHEST PAIN (IF NO RELIEF AFTER 3RD DOSE, PROCEED TO ED OR CALL 911). 75 tablet 1   NOVOLOG  FLEXPEN 100 UNIT/ML FlexPen Inject 0-30 Units into the skin 3 (three) times daily as needed for high blood sugar (If blood glucose is above 200).     omeprazole  (PRILOSEC) 40 MG capsule TAKE 1 CAPSULE BY MOUTH TWICE A DAY 180 capsule 3   ondansetron  (ZOFRAN ) 4 MG tablet TAKE 1 TABLET BY MOUTH EVERY 8 HOURS AS NEEDED FOR NAUSEA AND VOMITING (Patient taking differently: Taking 1/2 at bedtime) 45 tablet 2   ONETOUCH VERIO  test strip 1 each 3 (three) times daily.     promethazine  (PHENERGAN ) 25 MG tablet Take 25 mg by mouth every 6 (six) hours as needed  for nausea or vomiting.     traZODone  (DESYREL ) 50 MG tablet Take 150 mg by mouth at bedtime.     TRESIBA  FLEXTOUCH 200 UNIT/ML FlexTouch Pen Inject 40 Units into the skin daily. As directed (Patient taking differently: Inject 45 Units into the skin 2 (two) times daily. As directed Between novolog )     No current facility-administered medications on file prior to visit.    Allergies  Allergen Reactions   Erythromycin Base Diarrhea   Azithromycin Other (See Comments)    Upset stomach    Pollen Extract Cough    Runny nose   Remeron [Mirtazapine] Other (See Comments)    Makes her too sleepy   Zolpidem Tartrate Other (See Comments)    amnesia   Other Rash and Other (See Comments)    Grass, dust, green beans, corn, cabbage Reaction: upset stomach   Sulfonamide Derivatives Rash    Mouth sores   Tree Extract Rash    Blood pressure 110/70, pulse (!) 58.   Assessment/Plan:  1. Hypertension -  Essential hypertension, benign Assessment: Blood pressure is controlled in-office with readings of 119/65 mmHg and 110/70 mmHg, both within goal (<130/80 mmHg). Mild hyponatremia noted at 130 mmol/L (05/2024), decreased from 139 mmol/L (02/2024), coinciding with pt resuming hydrochlorothiazide . Home BP monitor shows systolic readings approximately 20 mmHg higher than office measurements - consider purchasing Omron BP cuff Patient reports afternoon BP readings higher than morning values. Medications are well tolerated with no reported side effects. Patient denies shortness of breath, palpitations, headaches, or swelling. Reinforced importance of regular exercise and low-sodium diet; patient is currently working with a nutritionist Discussing proper technique when checking BP  Plan:  Discontinue hydrochlorothiazide  Start losartan  100 mg daily in the morning,  patient has supply at home Start taking amlodipine  2.5 mg daily in the evening Continue metoprolol  25 mg twice daily Rx for blood pressure sent to [facility] pharmacy as requested  Patient should record blood pressure and heart rate readings and report BP and HR values to PharmD in 2 weeks. May consider reducing beta-blocker dose in the future as heart rate remains in the high 50s. Obtain BMP in 2 weeks to monitor electrolytes after discontinuing hydrochlorothiazide .  Thank you  Maxmillian Carsey E. Janisse Ghan, Pharm.D, CPP [place] [facility]. [facility] [address], [place], [place] [address] Phone: [phone]; Fax: [phone]

## 2024-06-22 ENCOUNTER — Ambulatory Visit: Attending: Cardiology

## 2024-06-22 ENCOUNTER — Telehealth: Payer: Self-pay

## 2024-06-22 VITALS — BP 110/70 | HR 58

## 2024-06-22 DIAGNOSIS — I10 Essential (primary) hypertension: Secondary | ICD-10-CM | POA: Diagnosis not present

## 2024-06-22 MED ORDER — LOSARTAN POTASSIUM 100 MG PO TABS: 100.0000 mg | ORAL_TABLET | Freq: Every day | ORAL | 2 refills | Status: DC

## 2024-06-22 MED ORDER — OMRON 3 SERIES BP MONITOR DEVI: 1.0000 | Freq: Every day | 0 refills | Status: DC

## 2024-06-22 NOTE — Telephone Encounter (Signed)
 My chart message sent

## 2024-06-22 NOTE — Patient Instructions (Addendum)
 Changes made by your pharmacist Kimbely Whiteaker E. Modest Draeger, PharmD, CPP at today's visit:    Instructions/Changes  (what do you need to do) Your Notes  (what you did and when you did it)  Begin taking amlodipine  2.5 mg daily in the afternoon with second dose of metoprolol  tartrate   2. Stop hydrochlorothiazide  and just take      losartan  100 mg in the morning    3. Continue taking metoprolol  tartate 25 mg      twice daily   4. Repeat labs (basic metabolic panel) in      ~2 weeks after stopping        hydrochlorothiazide  (obtain these labs      at gastroenterology appointment)   5. Report blood pressure values to PharmD      in 2 weeks via Mychart   6. Prescription for blood pressure monitor       sent [facility]     Aya Geisel E. Jaece Ducharme, Pharm.D, CPP [place] [facility]. [facility] [address], [place], [place] [address] Phone: [phone]; Fax: [phone]     HOW TO TAKE YOUR BLOOD PRESSURE AT HOME  Rest 5 minutes before taking your blood pressure.  Don't smoke or drink caffeinated beverages for at least 30 minutes before. Take your blood pressure before (not after) you eat. Sit comfortably with your back supported and both feet on the floor (don't cross your legs). Elevate your arm to heart level on a table or a desk. Use the proper sized cuff. It should fit smoothly and snugly around your bare upper arm. There should be enough room to slip a fingertip under the cuff. The bottom edge of the cuff should be 1 inch above the crease of the elbow. Ideally, take 3 measurements at one sitting and record the average.  Important lifestyle changes to control high blood pressure  Intervention  Effect on the BP  Lose extra pounds and watch your waistline Weight loss is one of the most effective lifestyle changes for controlling blood pressure. If you're overweight or obese, losing even a small amount of weight can help reduce blood pressure. Blood  pressure might go down by about 1 millimeter of mercury (mm Hg) with each kilogram (about 2.2 pounds) of weight lost.  Exercise regularly As a general goal, aim for at least 30 minutes of moderate physical activity every day. Regular physical activity can lower high blood pressure by about 5 to 8 mm Hg.  Eat a healthy diet Eating a diet rich in whole grains, fruits, vegetables, and low-fat dairy products and low in saturated fat and cholesterol. A healthy diet can lower high blood pressure by up to 11 mm Hg.  Reduce salt (sodium) in your diet Even a small reduction of sodium in the diet can improve heart health and reduce high blood pressure by about 5 to 6 mm Hg.  Limit alcohol  One drink equals 12 ounces of beer, 5 ounces of wine, or 1.5 ounces of 80-proof liquor.  Limiting alcohol  to less than one drink a day for women or two drinks a day for men can help lower blood pressure by about 4 mm Hg.   If you have any questions or concerns please use My Chart to send questions or call the office at [phone]

## 2024-06-22 NOTE — Assessment & Plan Note (Addendum)
 Assessment: Blood pressure is controlled in-office with readings of 119/65 mmHg and 110/70 mmHg, both within goal (<130/80 mmHg). Mild hyponatremia noted at 130 mmol/L (05/2024), decreased from 139 mmol/L (02/2024), coinciding with pt resuming hydrochlorothiazide . Home BP monitor shows systolic readings approximately 20 mmHg higher than office measurements - consider purchasing Omron BP cuff Patient reports afternoon BP readings higher than morning values. Medications are well tolerated with no reported side effects. Patient denies shortness of breath, palpitations, headaches, or swelling. Reinforced importance of regular exercise and low-sodium diet; patient is currently working with a nutritionist Discussing proper technique when checking BP  Plan:  Discontinue hydrochlorothiazide  Start losartan  100 mg daily in the morning, patient has supply at home Start taking amlodipine  2.5 mg daily in the evening Continue metoprolol  25 mg twice daily Rx for blood pressure sent to [facility] pharmacy as requested  Patient should record blood pressure and heart rate readings and report BP and HR values to PharmD in 2 weeks. May consider reducing beta-blocker dose in the future as heart rate remains in the high 50s. Obtain BMP in 2 weeks to monitor electrolytes after discontinuing hydrochlorothiazide .

## 2024-06-26 ENCOUNTER — Ambulatory Visit (HOSPITAL_COMMUNITY)
Admission: RE | Admit: 2024-06-26 | Discharge: 2024-06-26 | Disposition: A | Source: Ambulatory Visit | Attending: Gastroenterology

## 2024-06-26 DIAGNOSIS — K7460 Unspecified cirrhosis of liver: Secondary | ICD-10-CM

## 2024-06-29 ENCOUNTER — Ambulatory Visit: Payer: Self-pay | Admitting: Gastroenterology

## 2024-07-05 ENCOUNTER — Other Ambulatory Visit (INDEPENDENT_AMBULATORY_CARE_PROVIDER_SITE_OTHER)

## 2024-07-05 ENCOUNTER — Ambulatory Visit: Admitting: Gastroenterology

## 2024-07-05 ENCOUNTER — Encounter: Payer: Self-pay | Admitting: Gastroenterology

## 2024-07-05 VITALS — BP 124/68 | HR 63 | Ht 62.0 in | Wt 158.0 lb

## 2024-07-05 DIAGNOSIS — K7460 Unspecified cirrhosis of liver: Secondary | ICD-10-CM

## 2024-07-05 DIAGNOSIS — D500 Iron deficiency anemia secondary to blood loss (chronic): Secondary | ICD-10-CM

## 2024-07-05 DIAGNOSIS — R1314 Dysphagia, pharyngoesophageal phase: Secondary | ICD-10-CM

## 2024-07-05 DIAGNOSIS — R1084 Generalized abdominal pain: Secondary | ICD-10-CM | POA: Diagnosis not present

## 2024-07-05 DIAGNOSIS — I8510 Secondary esophageal varices without bleeding: Secondary | ICD-10-CM

## 2024-07-05 LAB — PROTIME-INR
INR: 1.2 ratio — ABNORMAL HIGH (ref 0.8–1.0)
Prothrombin Time: 12.7 s (ref 9.6–13.1)

## 2024-07-05 NOTE — Patient Instructions (Signed)
 _______________________________________________________  If your blood pressure at your visit was 140/90 or greater, please contact your primary care physician to follow up on this.  _______________________________________________________  If you are age 75 or older, your body mass index should be between 23-30. Your Body mass index is 28.9 kg/m. If this is out of the aforementioned range listed, please consider follow up with your Primary Care Provider.  If you are age 28 or younger, your body mass index should be between 19-25. Your Body mass index is 28.9 kg/m. If this is out of the aformentioned range listed, please consider follow up with your Primary Care Provider.   ________________________________________________________  The [facility] providers would like to encourage you to use MYCHART to communicate with providers for non-urgent requests or questions.  Due to long hold times on the telephone, sending your provider a message by [facility] may be a faster and more efficient way to get a response.  Please allow 48 business hours for a response.  Please remember that this is for non-urgent requests.  _______________________________________________________  [facility] is using a team-based approach to care.  Your team is made up of your doctor and two to three APPS. Our APPS (Nurse Practitioners and Physician Assistants) work with your physician to ensure care continuity for you. They are fully qualified to address your health concerns and develop a treatment plan. They communicate directly with your gastroenterologist to care for you. Seeing the Advanced Practice Practitioners on your physician's team can help you by facilitating care more promptly, often allowing for earlier appointments, access to diagnostic testing, procedures, and other specialty referrals.   Your provider has requested that you go to the basement level for lab work before leaving today. Press B on the  elevator. The lab is located at the first door on the left as you exit the elevator.  You have been scheduled for an endoscopy. Please follow written instructions given to you at your visit today.  If you use inhalers (even only as needed), please bring them with you on the day of your procedure.  If you take any of the following medications, they will need to be adjusted prior to your procedure:   DO NOT TAKE 7 DAYS PRIOR TO TEST- Trulicity (dulaglutide) Ozempic, Wegovy (semaglutide) Mounjaro, Zepbound (tirzepatide) Bydureon Bcise (exanatide extended release)  DO NOT TAKE 1 DAY PRIOR TO YOUR TEST Rybelsus (semaglutide) Adlyxin (lixisenatide) Victoza (liraglutide) Byetta (exanatide) ___________________________________________________________________________  Due to recent changes in healthcare laws, you may see the results of your imaging and laboratory studies on MyChart before your provider has had a chance to review them.  We understand that in some cases there may be results that are confusing or concerning to you. Not all laboratory results come back in the same time frame and the provider may be waiting for multiple results in order to interpret others.  Please give us  48 hours in order for your provider to thoroughly review all the results before contacting the office for clarification of your results.   It was a pleasure to see you today!  Thank you for trusting me with your gastrointestinal care!
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
## 2024-07-07 ENCOUNTER — Telehealth: Payer: Self-pay | Admitting: Cardiology

## 2024-07-07 LAB — AFP TUMOR MARKER: AFP-Tumor Marker: 4.6 ng/mL

## 2024-07-07 NOTE — Telephone Encounter (Signed)
Patient states that she is returning call. Please advise.

## 2024-07-07 NOTE — Telephone Encounter (Signed)
 Please see encounter on 12/4, returned patient's call

## 2024-07-10 ENCOUNTER — Other Ambulatory Visit: Payer: Self-pay

## 2024-07-11 ENCOUNTER — Ambulatory Visit: Payer: Self-pay | Admitting: Gastroenterology

## 2024-07-11 MED ORDER — NITROGLYCERIN 0.4 MG SL SUBL: 0.4000 mg | SUBLINGUAL_TABLET | SUBLINGUAL | 2 refills | Status: DC | PRN

## 2024-07-25 ENCOUNTER — Telehealth: Payer: Self-pay

## 2024-07-25 NOTE — Telephone Encounter (Signed)
 Returned call to patient has an endoscopy scheduled 08/03/24 and if Dr Federico would approve her for this procedure. Advised to call her GI provider, Dr Legrand, MD and have him reach out to Dr Federico for clearance. Pt verbalized understanding and reported she felt comfortable asking for this. Patient also expressed questions regarding iron  infusions. Providers, Dr Federico and Johnston Police, PA-C updated with her concerns.

## 2024-07-26 ENCOUNTER — Telehealth: Payer: Self-pay

## 2024-07-26 ENCOUNTER — Other Ambulatory Visit: Payer: Self-pay | Admitting: Physician Assistant

## 2024-07-26 ENCOUNTER — Encounter: Payer: Self-pay | Admitting: Gastroenterology

## 2024-07-26 ENCOUNTER — Telehealth: Payer: Self-pay | Admitting: Pharmacy Technician

## 2024-07-26 NOTE — Telephone Encounter (Signed)
 Auth Submission: NO AUTH NEEDED Site of care: Site of care: [facility] Payer: [employer] Medication & CPT/J Code(s) submitted: Venofer  (Iron  Sucrose) J1756 Diagnosis Code: d50.9 Route of submission (phone, fax, portal):  Phone # Fax # Auth type: Buy/Bill PB Units/visits requested: 3 doses Reference number:  Approval from: 07/27/23 to 11/16/24

## 2024-07-26 NOTE — Telephone Encounter (Signed)
 Johnston, patient will be scheduled as soon as possible.  Auth Submission: NO AUTH NEEDED Site of care: Site of care: [facility] Payer: [employer] medicare Medication & CPT/J Code(s) submitted: Venofer  (Iron  Sucrose) J1756 Diagnosis Code:  Route of submission (phone, fax, portal):  Phone # Fax # Auth type: Buy/Bill PB Units/visits requested: 200mg  x 3 doses Reference number:  Approval from: 07/26/24 to 07/19/25

## 2024-07-28 ENCOUNTER — Ambulatory Visit

## 2024-07-28 VITALS — BP 171/71 | HR 61 | Temp 98.3°F | Resp 16 | Ht 62.0 in | Wt 156.2 lb

## 2024-07-28 DIAGNOSIS — D500 Iron deficiency anemia secondary to blood loss (chronic): Secondary | ICD-10-CM | POA: Diagnosis not present

## 2024-07-28 MED ORDER — IRON SUCROSE 200 MG IVPB - SIMPLE MED
200.0000 mg | Freq: Once | Status: AC
  Administered 2024-07-28: 200 mg via INTRAVENOUS
  Filled 2024-07-28: qty 110

## 2024-07-28 NOTE — Progress Notes (Signed)
 Diagnosis: , Acute Anemia  Provider:  Lonna Coder MD  Procedure: IV Infusion  IV Type: Peripheral, IV Location: L Forearm  , Venofer  (Iron  Sucrose), Dose: 200 mg  Infusion Start Time: 0931  Infusion Stop Time: 0950  Post Infusion IV Care: Observation period completed and Peripheral IV Discontinued  Discharge: Condition: Good, Destination: Home . AVS Declined  Performed by:  Donny Childes, RN

## 2024-08-03 ENCOUNTER — Ambulatory Visit: Admitting: Gastroenterology

## 2024-08-03 ENCOUNTER — Encounter: Payer: Self-pay | Admitting: Gastroenterology

## 2024-08-03 VITALS — BP 126/66 | HR 63 | Temp 97.7°F | Resp 16

## 2024-08-03 DIAGNOSIS — K449 Diaphragmatic hernia without obstruction or gangrene: Secondary | ICD-10-CM

## 2024-08-03 DIAGNOSIS — I8500 Esophageal varices without bleeding: Secondary | ICD-10-CM | POA: Diagnosis present

## 2024-08-03 DIAGNOSIS — R1314 Dysphagia, pharyngoesophageal phase: Secondary | ICD-10-CM

## 2024-08-03 DIAGNOSIS — I8510 Secondary esophageal varices without bleeding: Secondary | ICD-10-CM

## 2024-08-03 DIAGNOSIS — K7460 Unspecified cirrhosis of liver: Secondary | ICD-10-CM

## 2024-08-03 MED ORDER — SODIUM CHLORIDE 0.9 % IV SOLN: 500.0000 mL | Freq: Once | INTRAVENOUS | Status: DC

## 2024-08-03 NOTE — Progress Notes (Signed)
 No significant changes to clinical history since GI office visit on 07/05/24.  The patient is appropriate for an endoscopic procedure in the ambulatory setting.  - Victory Brand, MD

## 2024-08-03 NOTE — Patient Instructions (Addendum)
 " Continue present medications.                           - Return to my office in 6 months for follow-up of                            cirrhosis                           Report will be sent to patient's cardiologist for                            consideration of switch from metoprolol  to                            carvedilol (if appropriate from a cardiovascular                            standpoint). While this patient's grade 1 varices                            do not currently require nonselective beta-blocker                            prophylaxis, being on a nonselective beta-blocker                            may decrease the chance of the varices progressing                            if the cirrhosis and portal hypertension progress                            in the future.    YOU HAD AN ENDOSCOPIC PROCEDURE TODAY AT THE [facility]:   Refer to the procedure report that was given to you for any specific questions about what was found during the examination.  If the procedure report does not answer your questions, please call your gastroenterologist to clarify.  If you requested that your care partner not be given the details of your procedure findings, then the procedure report has been included in a sealed envelope for you to review at your convenience later.  YOU SHOULD EXPECT: Some feelings of bloating in the abdomen. Passage of more gas than usual.  Walking can help get rid of the air that was put into your GI tract during the procedure and reduce the bloating.  Please Note:  You might notice some irritation and congestion in your nose or some drainage.  This is from the oxygen used during your procedure.  There is no need for concern and it should clear up in a day or so.  SYMPTOMS TO REPORT IMMEDIATELY: Following upper endoscopy (EGD)  Vomiting of blood or coffee ground material  New chest pain or pain under the shoulder blades  Painful or persistently difficult  swallowing  New shortness of breath  Fever of 100F or higher  Black, tarry-looking stools  For urgent or emergent issues, a gastroenterologist can be  reached at any hour by calling [phone]. Do not use MyChart messaging for urgent concerns.    DIET:  We do recommend a small meal at first, but then you may proceed to your regular diet.  Drink plenty of fluids but you should avoid alcoholic beverages for 24 hours.  ACTIVITY:  You should plan to take it easy for the rest of today and you should NOT DRIVE or use heavy machinery until tomorrow (because of the sedation medicines used during the test).    FOLLOW UP: Our staff will call the number listed on your records the next business day following your procedure.  We will call around 7:15- 8:00 am to check on you and address any questions or concerns that you may have regarding the information given to you following your procedure. If we do not reach you, we will leave a message.      SIGNATURES/CONFIDENTIALITY: You and/or your care partner have signed paperwork which will be entered into your electronic medical record.  These signatures attest to the fact that that the information above on your After Visit Summary has been reviewed and is understood.  Full responsibility of the confidentiality of this discharge information lies with you and/or your care-partner.  "

## 2024-08-03 NOTE — Progress Notes (Signed)
 1559 BP 175/80, Labetalol  given IV, MD update, vss

## 2024-08-03 NOTE — Progress Notes (Signed)
 Pt states she is having chest discomfort but has it everyday and no change in severity or location.  States she believes it is anxiety, Dr Legrand updated and cleared patient to have egd performed, vss

## 2024-08-03 NOTE — Progress Notes (Signed)
1555 Robinul 0.1 mg IV given due large amount of secretions upon assessment.  MD made aware, vss  

## 2024-08-03 NOTE — Op Note (Signed)
 [facility] Patient Name: [name] Procedure Date: 08/03/2024 3:43 PM MRN: [id number] Endoscopist: Victory L. Legrand , MD, [id number] Age: 74 Referring MD:  Date of Birth: [birth year] Gender: Female Account #: [id number] Procedure:                Upper GI endoscopy Indications:              Esophageal varices                           Pharyngeal esophageal dysphagia and perceived                            excess mucus production Medicines:                Monitored Anesthesia Care Procedure:                Pre-Anesthesia Assessment:                           - Prior to the procedure, a History and Physical                            was performed, and patient medications and                            allergies were reviewed. The patient's tolerance of                            previous anesthesia was also reviewed. The risks                            and benefits of the procedure and the sedation                            options and risks were discussed with the patient.                            All questions were answered, and informed consent                            was obtained. Prior Anticoagulants: The patient has                            taken no anticoagulant or antiplatelet agents. ASA                            Grade Assessment: III - A patient with severe                            systemic disease. After reviewing the risks and                            benefits, the patient was deemed in satisfactory  condition to undergo the procedure.                           After obtaining informed consent, the endoscope was                            passed under direct vision. Throughout the                            procedure, the patient's blood pressure, pulse, and                            oxygen saturations were monitored continuously. The                            Olympus Scope [id number] was introduced through the                             mouth, and advanced to the second part of duodenum.                            The upper GI endoscopy was accomplished without                            difficulty. The patient tolerated the procedure                            well. Scope In: Scope Out: Findings:                 The larynx was normal.                           Grade I varices were found in the lower third of                            the esophagus. They were small in size.                           A 1 cm hiatal hernia was present.                           The stomach was normal.                           The cardia and gastric fundus were normal on                            retroflexion. (No gastric varices were seen)                           The examined duodenum was normal. Complications:            No immediate complications. Estimated Blood Loss:     Estimated blood loss: none. Impression:               -  Normal larynx.                           - Grade I esophageal varices.                           - 1 cm hiatal hernia.                           - Normal stomach.                           - Normal examined duodenum.                           - No specimens collected.                           No upper GI source seen for this patient's throat                            symptoms Recommendation:           - Patient has a contact number available for                            emergencies. The signs and symptoms of potential                            delayed complications were discussed with the                            patient. Return to normal activities tomorrow.                            Written discharge instructions were provided to the                            patient.                           - Resume previous diet.                           - Continue present medications.                           - Return to my office in 6 months for follow-up of                             cirrhosis                           Report will be sent to patient's cardiologist for                            consideration of switch from metoprolol  to  carvedilol (if appropriate from a cardiovascular                            standpoint). While this patient's grade 1 varices                            do not currently require nonselective beta-blocker                            prophylaxis, being on a nonselective beta-blocker                            may decrease the chance of the varices progressing                            if the cirrhosis and portal hypertension progress                            in the future. Herndon Grill L. Legrand, MD 08/03/2024 4:13:31 PM This report has been signed electronically.

## 2024-08-03 NOTE — Progress Notes (Signed)
 Report given to PACU, vss

## 2024-08-04 ENCOUNTER — Telehealth: Payer: Self-pay

## 2024-08-04 NOTE — Telephone Encounter (Signed)
" °  Follow up Call-     08/03/2024    3:00 PM  Call back number  Post procedure Call Back phone  # [phone]  Permission to leave phone message Yes     Patient questions:  Do you have a fever, pain , or abdominal swelling? No. Pain Score  0 *  Have you tolerated food without any problems? Yes.    Have you been able to return to your normal activities? Yes.    Do you have any questions about your discharge instructions: Diet   No. Medications  No. Follow up visit  No.  Do you have questions or concerns about your Care? No.  Actions: * If pain score is 4 or above: No action needed, pain <4.   "

## 2024-08-07 ENCOUNTER — Encounter: Payer: Self-pay | Admitting: Physician Assistant

## 2024-08-08 ENCOUNTER — Ambulatory Visit

## 2024-08-09 ENCOUNTER — Emergency Department (HOSPITAL_COMMUNITY)

## 2024-08-09 ENCOUNTER — Inpatient Hospital Stay (HOSPITAL_COMMUNITY)

## 2024-08-09 DIAGNOSIS — I252 Old myocardial infarction: Secondary | ICD-10-CM

## 2024-08-09 DIAGNOSIS — Z87891 Personal history of nicotine dependence: Secondary | ICD-10-CM

## 2024-08-09 DIAGNOSIS — R680 Hypothermia, not associated with low environmental temperature: Secondary | ICD-10-CM | POA: Diagnosis present

## 2024-08-09 DIAGNOSIS — G931 Anoxic brain damage, not elsewhere classified: Secondary | ICD-10-CM | POA: Diagnosis present

## 2024-08-09 DIAGNOSIS — J9601 Acute respiratory failure with hypoxia: Secondary | ICD-10-CM | POA: Diagnosis not present

## 2024-08-09 DIAGNOSIS — E871 Hypo-osmolality and hyponatremia: Secondary | ICD-10-CM | POA: Diagnosis present

## 2024-08-09 DIAGNOSIS — C9110 Chronic lymphocytic leukemia of B-cell type not having achieved remission: Secondary | ICD-10-CM | POA: Diagnosis present

## 2024-08-09 DIAGNOSIS — M797 Fibromyalgia: Secondary | ICD-10-CM | POA: Diagnosis present

## 2024-08-09 DIAGNOSIS — K7460 Unspecified cirrhosis of liver: Secondary | ICD-10-CM | POA: Diagnosis present

## 2024-08-09 DIAGNOSIS — G4733 Obstructive sleep apnea (adult) (pediatric): Secondary | ICD-10-CM | POA: Diagnosis present

## 2024-08-09 DIAGNOSIS — E782 Mixed hyperlipidemia: Secondary | ICD-10-CM | POA: Diagnosis present

## 2024-08-09 DIAGNOSIS — R7989 Other specified abnormal findings of blood chemistry: Secondary | ICD-10-CM | POA: Diagnosis not present

## 2024-08-09 DIAGNOSIS — R570 Cardiogenic shock: Secondary | ICD-10-CM | POA: Diagnosis present

## 2024-08-09 DIAGNOSIS — Z79899 Other long term (current) drug therapy: Secondary | ICD-10-CM

## 2024-08-09 DIAGNOSIS — N170 Acute kidney failure with tubular necrosis: Secondary | ICD-10-CM | POA: Diagnosis not present

## 2024-08-09 DIAGNOSIS — R34 Anuria and oliguria: Secondary | ICD-10-CM | POA: Diagnosis not present

## 2024-08-09 DIAGNOSIS — Z881 Allergy status to other antibiotic agents status: Secondary | ICD-10-CM

## 2024-08-09 DIAGNOSIS — G253 Myoclonus: Secondary | ICD-10-CM | POA: Diagnosis not present

## 2024-08-09 DIAGNOSIS — Z515 Encounter for palliative care: Secondary | ICD-10-CM

## 2024-08-09 DIAGNOSIS — Z955 Presence of coronary angioplasty implant and graft: Secondary | ICD-10-CM

## 2024-08-09 DIAGNOSIS — D649 Anemia, unspecified: Secondary | ICD-10-CM | POA: Diagnosis present

## 2024-08-09 DIAGNOSIS — E1165 Type 2 diabetes mellitus with hyperglycemia: Secondary | ICD-10-CM | POA: Diagnosis present

## 2024-08-09 DIAGNOSIS — J09X2 Influenza due to identified novel influenza A virus with other respiratory manifestations: Secondary | ICD-10-CM | POA: Diagnosis not present

## 2024-08-09 DIAGNOSIS — Z961 Presence of intraocular lens: Secondary | ICD-10-CM | POA: Diagnosis present

## 2024-08-09 DIAGNOSIS — Z9049 Acquired absence of other specified parts of digestive tract: Secondary | ICD-10-CM

## 2024-08-09 DIAGNOSIS — E876 Hypokalemia: Secondary | ICD-10-CM | POA: Diagnosis not present

## 2024-08-09 DIAGNOSIS — F419 Anxiety disorder, unspecified: Secondary | ICD-10-CM | POA: Diagnosis present

## 2024-08-09 DIAGNOSIS — I21A1 Myocardial infarction type 2: Secondary | ICD-10-CM | POA: Diagnosis not present

## 2024-08-09 DIAGNOSIS — I469 Cardiac arrest, cause unspecified: Secondary | ICD-10-CM

## 2024-08-09 DIAGNOSIS — E872 Acidosis, unspecified: Secondary | ICD-10-CM | POA: Diagnosis present

## 2024-08-09 DIAGNOSIS — I4510 Unspecified right bundle-branch block: Secondary | ICD-10-CM | POA: Diagnosis present

## 2024-08-09 DIAGNOSIS — Z981 Arthrodesis status: Secondary | ICD-10-CM

## 2024-08-09 DIAGNOSIS — J101 Influenza due to other identified influenza virus with other respiratory manifestations: Principal | ICD-10-CM | POA: Diagnosis present

## 2024-08-09 DIAGNOSIS — Z794 Long term (current) use of insulin: Secondary | ICD-10-CM

## 2024-08-09 DIAGNOSIS — R569 Unspecified convulsions: Secondary | ICD-10-CM | POA: Diagnosis present

## 2024-08-09 DIAGNOSIS — K7581 Nonalcoholic steatohepatitis (NASH): Secondary | ICD-10-CM | POA: Diagnosis present

## 2024-08-09 DIAGNOSIS — I4891 Unspecified atrial fibrillation: Secondary | ICD-10-CM | POA: Diagnosis not present

## 2024-08-09 DIAGNOSIS — J301 Allergic rhinitis due to pollen: Secondary | ICD-10-CM | POA: Diagnosis present

## 2024-08-09 DIAGNOSIS — M810 Age-related osteoporosis without current pathological fracture: Secondary | ICD-10-CM | POA: Diagnosis present

## 2024-08-09 DIAGNOSIS — Z882 Allergy status to sulfonamides status: Secondary | ICD-10-CM

## 2024-08-09 DIAGNOSIS — Z8249 Family history of ischemic heart disease and other diseases of the circulatory system: Secondary | ICD-10-CM

## 2024-08-09 DIAGNOSIS — I10 Essential (primary) hypertension: Secondary | ICD-10-CM | POA: Diagnosis present

## 2024-08-09 DIAGNOSIS — I2510 Atherosclerotic heart disease of native coronary artery without angina pectoris: Secondary | ICD-10-CM | POA: Diagnosis present

## 2024-08-09 DIAGNOSIS — K219 Gastro-esophageal reflux disease without esophagitis: Secondary | ICD-10-CM | POA: Diagnosis present

## 2024-08-09 DIAGNOSIS — Z9851 Tubal ligation status: Secondary | ICD-10-CM

## 2024-08-09 DIAGNOSIS — K5521 Angiodysplasia of colon with hemorrhage: Secondary | ICD-10-CM | POA: Diagnosis present

## 2024-08-09 DIAGNOSIS — Z9841 Cataract extraction status, right eye: Secondary | ICD-10-CM

## 2024-08-09 DIAGNOSIS — Z9842 Cataract extraction status, left eye: Secondary | ICD-10-CM

## 2024-08-09 LAB — CBC WITH DIFFERENTIAL/PLATELET
Abs Immature Granulocytes: 0.39 K/uL — ABNORMAL HIGH (ref 0.00–0.07)
Basophils Absolute: 0.2 K/uL — ABNORMAL HIGH (ref 0.0–0.1)
Basophils Relative: 0 %
Eosinophils Absolute: 0 K/uL (ref 0.0–0.5)
Eosinophils Relative: 0 %
HCT: 40.1 % (ref 36.0–46.0)
Hemoglobin: 12.3 g/dL (ref 12.0–15.0)
Immature Granulocytes: 0 %
Lymphocytes Relative: 75 %
Lymphs Abs: 65.6 K/uL — ABNORMAL HIGH (ref 0.7–4.0)
MCH: 32 pg (ref 26.0–34.0)
MCHC: 30.7 g/dL (ref 30.0–36.0)
MCV: 104.4 fL — ABNORMAL HIGH (ref 80.0–100.0)
Monocytes Absolute: 11.1 K/uL — ABNORMAL HIGH (ref 0.1–1.0)
Monocytes Relative: 13 %
Neutro Abs: 10.1 K/uL — ABNORMAL HIGH (ref 1.7–7.7)
Neutrophils Relative %: 12 %
Platelets: 155 K/uL (ref 150–400)
RBC: 3.84 MIL/uL — ABNORMAL LOW (ref 3.87–5.11)
RDW: 13.6 % (ref 11.5–15.5)
Smear Review: NORMAL
WBC: 87.4 K/uL (ref 4.0–10.5)
nRBC: 0 % (ref 0.0–0.2)

## 2024-08-09 LAB — BASIC METABOLIC PANEL WITH GFR
Anion gap: 13 (ref 5–15)
BUN: 16 mg/dL (ref 8–23)
CO2: 20 mmol/L — ABNORMAL LOW (ref 22–32)
Calcium: 8.1 mg/dL — ABNORMAL LOW (ref 8.9–10.3)
Chloride: 96 mmol/L — ABNORMAL LOW (ref 98–111)
Creatinine, Ser: 0.75 mg/dL (ref 0.44–1.00)
GFR, Estimated: >60 mL/min
Glucose, Bld: 278 mg/dL — ABNORMAL HIGH (ref 70–99)
Potassium: 4.2 mmol/L (ref 3.5–5.1)
Sodium: 129 mmol/L — ABNORMAL LOW (ref 135–145)

## 2024-08-09 LAB — COMPREHENSIVE METABOLIC PANEL WITH GFR
ALT: 35 U/L (ref 0–44)
AST: 68 U/L — ABNORMAL HIGH (ref 15–41)
Albumin: 3.7 g/dL (ref 3.5–5.0)
Alkaline Phosphatase: 89 U/L (ref 38–126)
Anion gap: 20 — ABNORMAL HIGH (ref 5–15)
BUN: 14 mg/dL (ref 8–23)
CO2: 13 mmol/L — ABNORMAL LOW (ref 22–32)
Calcium: 8.2 mg/dL — ABNORMAL LOW (ref 8.9–10.3)
Chloride: 97 mmol/L — ABNORMAL LOW (ref 98–111)
Creatinine, Ser: 0.95 mg/dL (ref 0.44–1.00)
GFR, Estimated: >60 mL/min
Glucose, Bld: 252 mg/dL — ABNORMAL HIGH (ref 70–99)
Potassium: 4.5 mmol/L (ref 3.5–5.1)
Sodium: 131 mmol/L — ABNORMAL LOW (ref 135–145)
Total Bilirubin: 0.7 mg/dL (ref 0.0–1.2)
Total Protein: 6.4 g/dL — ABNORMAL LOW (ref 6.5–8.1)

## 2024-08-09 LAB — I-STAT ARTERIAL BLOOD GAS, ED
Acid-base deficit: 10 mmol/L — ABNORMAL HIGH (ref 0.0–2.0)
Bicarbonate: 18.9 mmol/L — ABNORMAL LOW (ref 20.0–28.0)
Calcium, Ion: 1.17 mmol/L (ref 1.15–1.40)
HCT: 36 % (ref 36.0–46.0)
Hemoglobin: 12.2 g/dL (ref 12.0–15.0)
O2 Saturation: 100 %
Patient temperature: 95.3
Potassium: 3.7 mmol/L (ref 3.5–5.1)
Sodium: 131 mmol/L — ABNORMAL LOW (ref 135–145)
TCO2: 21 mmol/L — ABNORMAL LOW (ref 22–32)
pCO2 arterial: 49 mmHg — ABNORMAL HIGH (ref 32–48)
pH, Arterial: 7.184 — CL (ref 7.35–7.45)
pO2, Arterial: 407 mmHg — ABNORMAL HIGH (ref 83–108)

## 2024-08-09 LAB — POCT I-STAT 7, (LYTES, BLD GAS, ICA,H+H)
Acid-base deficit: 8 mmol/L — ABNORMAL HIGH (ref 0.0–2.0)
Acid-base deficit: 8 mmol/L — ABNORMAL HIGH (ref 0.0–2.0)
Bicarbonate: 19.8 mmol/L — ABNORMAL LOW (ref 20.0–28.0)
Bicarbonate: 18.7 mmol/L — ABNORMAL LOW (ref 20.0–28.0)
Calcium, Ion: 1.17 mmol/L (ref 1.15–1.40)
Calcium, Ion: 1.19 mmol/L (ref 1.15–1.40)
HCT: 34 % — ABNORMAL LOW (ref 36.0–46.0)
HCT: 33 % — ABNORMAL LOW (ref 36.0–46.0)
Hemoglobin: 11.6 g/dL — ABNORMAL LOW (ref 12.0–15.0)
Hemoglobin: 11.2 g/dL — ABNORMAL LOW (ref 12.0–15.0)
O2 Saturation: 100 %
O2 Saturation: 100 %
Patient temperature: 35.2
Patient temperature: 35.8
Potassium: 4 mmol/L (ref 3.5–5.1)
Potassium: 3.8 mmol/L (ref 3.5–5.1)
Sodium: 131 mmol/L — ABNORMAL LOW (ref 135–145)
Sodium: 131 mmol/L — ABNORMAL LOW (ref 135–145)
TCO2: 21 mmol/L — ABNORMAL LOW (ref 22–32)
TCO2: 20 mmol/L — ABNORMAL LOW (ref 22–32)
pCO2 arterial: 43.7 mmHg (ref 32–48)
pCO2 arterial: 38.8 mmHg (ref 32–48)
pH, Arterial: 7.254 — ABNORMAL LOW (ref 7.35–7.45)
pH, Arterial: 7.284 — ABNORMAL LOW (ref 7.35–7.45)
pO2, Arterial: 396 mmHg — ABNORMAL HIGH (ref 83–108)
pO2, Arterial: 316 mmHg — ABNORMAL HIGH (ref 83–108)

## 2024-08-09 LAB — ECHOCARDIOGRAM COMPLETE
Area-P 1/2: 3.21 cm2
MV M vel: 3.18 m/s
MV Peak grad: 40.4 mmHg
S' Lateral: 2.5 cm
Single Plane A4C EF: 57.5 %

## 2024-08-09 LAB — I-STAT CHEM 8, ED
BUN: 15 mg/dL (ref 8–23)
Calcium, Ion: 1.13 mmol/L — ABNORMAL LOW (ref 1.15–1.40)
Chloride: 100 mmol/L (ref 98–111)
Creatinine, Ser: 0.8 mg/dL (ref 0.44–1.00)
Glucose, Bld: 249 mg/dL — ABNORMAL HIGH (ref 70–99)
HCT: 41 % (ref 36.0–46.0)
Hemoglobin: 13.9 g/dL (ref 12.0–15.0)
Potassium: 4 mmol/L (ref 3.5–5.1)
Sodium: 133 mmol/L — ABNORMAL LOW (ref 135–145)
TCO2: 19 mmol/L — ABNORMAL LOW (ref 22–32)

## 2024-08-09 LAB — URINALYSIS, W/ REFLEX TO CULTURE (INFECTION SUSPECTED)
Bilirubin Urine: NEGATIVE
Glucose, UA: 150 mg/dL — AB
Ketones, ur: NEGATIVE mg/dL
Leukocytes,Ua: NEGATIVE
Nitrite: NEGATIVE
Protein, ur: 100 mg/dL — AB
Specific Gravity, Urine: 1.012 (ref 1.005–1.030)
pH: 5 (ref 5.0–8.0)

## 2024-08-09 LAB — RESP PANEL BY RT-PCR (RSV, FLU A&B, COVID)  RVPGX2
Influenza A by PCR: POSITIVE — AB
Influenza B by PCR: NEGATIVE
Resp Syncytial Virus by PCR: NEGATIVE
SARS Coronavirus 2 by RT PCR: NEGATIVE

## 2024-08-09 LAB — I-STAT CG4 LACTIC ACID, ED: Lactic Acid, Venous: 8.5 mmol/L (ref 0.5–1.9)

## 2024-08-09 LAB — TSH: TSH: 1.7 u[IU]/mL (ref 0.350–4.500)

## 2024-08-09 LAB — MRSA NEXT GEN BY PCR, NASAL: MRSA by PCR Next Gen: NOT DETECTED

## 2024-08-09 LAB — CBG MONITORING, ED: Glucose-Capillary: 242 mg/dL — ABNORMAL HIGH (ref 70–99)

## 2024-08-09 LAB — TROPONIN T, HIGH SENSITIVITY
Troponin T High Sensitivity: 20 ng/L — ABNORMAL HIGH (ref 0–19)
Troponin T High Sensitivity: 52 ng/L — ABNORMAL HIGH (ref 0–19)

## 2024-08-09 LAB — HEMOGLOBIN A1C
Hgb A1c MFr Bld: 6.9 % — ABNORMAL HIGH (ref 4.8–5.6)
Mean Plasma Glucose: 151.33 mg/dL

## 2024-08-09 LAB — GLUCOSE, CAPILLARY: Glucose-Capillary: 267 mg/dL — ABNORMAL HIGH (ref 70–99)

## 2024-08-09 LAB — PRO BRAIN NATRIURETIC PEPTIDE: Pro Brain Natriuretic Peptide: 1150 pg/mL — ABNORMAL HIGH

## 2024-08-09 LAB — MAGNESIUM: Magnesium: 1.8 mg/dL (ref 1.7–2.4)

## 2024-08-09 LAB — LACTIC ACID, PLASMA: Lactic Acid, Venous: 3.5 mmol/L (ref 0.5–1.9)

## 2024-08-09 MED ORDER — VALPROATE SODIUM 100 MG/ML IV SOLN
500.0000 mg | Freq: Two times a day (BID) | INTRAVENOUS | Status: DC
  Administered 2024-08-09 – 2024-08-11 (×4): 500 mg via INTRAVENOUS
  Filled 2024-08-09 (×5): qty 5
  Filled 2024-08-09 (×2): qty 500

## 2024-08-09 MED ORDER — LEVETIRACETAM (KEPPRA) 500 MG/5 ML ADULT IV PUSH: 1000.0000 mg | Freq: Two times a day (BID) | INTRAVENOUS | Status: DC

## 2024-08-09 MED ORDER — PROPOFOL 1000 MG/100ML IV EMUL
0.0000 ug/kg/min | INTRAVENOUS | Status: DC
  Administered 2024-08-10 (×2): 20 ug/kg/min via INTRAVENOUS
  Administered 2024-08-11: 10 ug/kg/min via INTRAVENOUS
  Administered 2024-08-11: 20 ug/kg/min via INTRAVENOUS
  Administered 2024-08-12: 10 ug/kg/min via INTRAVENOUS
  Administered 2024-08-12: 80 ug/kg/min via INTRAVENOUS
  Administered 2024-08-13: 10 ug/kg/min via INTRAVENOUS
  Administered 2024-08-14: 20 ug/kg/min via INTRAVENOUS
  Filled 2024-08-09 (×7): qty 100
  Filled 2024-08-09: qty 200

## 2024-08-09 MED ORDER — VANCOMYCIN HCL 1500 MG/300ML IV SOLN
1500.0000 mg | Freq: Once | INTRAVENOUS | Status: AC
  Administered 2024-08-09: 1500 mg via INTRAVENOUS
  Filled 2024-08-09: qty 300

## 2024-08-09 MED ORDER — LACTATED RINGERS IV SOLN: INTRAVENOUS | Status: AC

## 2024-08-09 MED ORDER — VANCOMYCIN VARIABLE DOSE PER UNSTABLE RENAL FUNCTION (PHARMACIST DOSING): Status: DC

## 2024-08-09 MED ORDER — FAMOTIDINE 20 MG PO TABS
20.0000 mg | ORAL_TABLET | Freq: Two times a day (BID) | ORAL | Status: DC
  Administered 2024-08-09 – 2024-08-10 (×2): 20 mg
  Filled 2024-08-09 (×2): qty 1

## 2024-08-09 MED ORDER — OSELTAMIVIR PHOSPHATE 6 MG/ML PO SUSR
30.0000 mg | Freq: Two times a day (BID) | ORAL | Status: DC
  Administered 2024-08-09 – 2024-08-11 (×4): 30 mg
  Filled 2024-08-09: qty 5
  Filled 2024-08-09 (×2): qty 12.5
  Filled 2024-08-09: qty 5
  Filled 2024-08-09: qty 12.5

## 2024-08-09 MED ORDER — INSULIN ASPART 100 UNIT/ML IJ SOLN
0.0000 [IU] | INTRAMUSCULAR | Status: DC
  Administered 2024-08-09: 5 [IU] via SUBCUTANEOUS
  Administered 2024-08-10 (×2): 2 [IU] via SUBCUTANEOUS
  Administered 2024-08-10 (×2): 3 [IU] via SUBCUTANEOUS
  Filled 2024-08-09: qty 2
  Filled 2024-08-09 (×2): qty 5
  Filled 2024-08-09 (×2): qty 3

## 2024-08-09 MED ORDER — SENNA 8.6 MG PO TABS: 1.0000 | ORAL_TABLET | Freq: Two times a day (BID) | ORAL | Status: DC | PRN

## 2024-08-09 MED ORDER — SODIUM BICARBONATE 8.4 % IV SOLN
100.0000 meq | Freq: Once | INTRAVENOUS | Status: DC
  Filled 2024-08-09: qty 50

## 2024-08-09 MED ORDER — POLYETHYLENE GLYCOL 3350 17 G PO PACK: 17.0000 g | PACK | Freq: Every day | ORAL | Status: DC | PRN

## 2024-08-09 MED ORDER — PROPOFOL 1000 MG/100ML IV EMUL
INTRAVENOUS | Status: AC
  Administered 2024-08-09: 10 ug/kg/min via INTRAVENOUS
  Filled 2024-08-09: qty 100

## 2024-08-09 MED ORDER — IOHEXOL 350 MG/ML SOLN
75.0000 mL | Freq: Once | INTRAVENOUS | Status: AC | PRN
  Administered 2024-08-09: 75 mL via INTRAVENOUS

## 2024-08-09 MED ORDER — SODIUM BICARBONATE 8.4 % IV SOLN
50.0000 meq | Freq: Once | INTRAVENOUS | Status: AC
  Administered 2024-08-09: 50 meq via INTRAVENOUS

## 2024-08-09 MED ORDER — PIPERACILLIN-TAZOBACTAM 3.375 G IVPB
3.3750 g | Freq: Three times a day (TID) | INTRAVENOUS | Status: DC
  Administered 2024-08-10 – 2024-08-13 (×11): 3.375 g via INTRAVENOUS
  Filled 2024-08-09 (×11): qty 50

## 2024-08-09 MED ORDER — LEVETIRACETAM (KEPPRA) 500 MG/5 ML ADULT IV PUSH
1000.0000 mg | Freq: Two times a day (BID) | INTRAVENOUS | Status: DC
  Administered 2024-08-09 – 2024-08-11 (×4): 1000 mg via INTRAVENOUS
  Filled 2024-08-09 (×4): qty 10

## 2024-08-09 MED ORDER — FENTANYL 2500MCG IN NS 250ML (10MCG/ML) PREMIX INFUSION
0.0000 ug/h | INTRAVENOUS | Status: DC
  Administered 2024-08-09: 50 ug/h via INTRAVENOUS
  Filled 2024-08-09: qty 250

## 2024-08-09 MED ORDER — CHLORHEXIDINE GLUCONATE CLOTH 2 % EX PADS
6.0000 | MEDICATED_PAD | Freq: Every day | CUTANEOUS | Status: DC
  Administered 2024-08-10 – 2024-08-12 (×3): 6 via TOPICAL

## 2024-08-09 MED ORDER — SODIUM CHLORIDE 0.9 % IV SOLN
250.0000 mL | INTRAVENOUS | Status: AC
  Administered 2024-08-09: 250 mL via INTRAVENOUS

## 2024-08-09 MED ORDER — HEPARIN SODIUM (PORCINE) 5000 UNIT/ML IJ SOLN
5000.0000 [IU] | Freq: Three times a day (TID) | INTRAMUSCULAR | Status: DC
  Administered 2024-08-09 – 2024-08-13 (×11): 5000 [IU] via SUBCUTANEOUS
  Filled 2024-08-09 (×11): qty 1

## 2024-08-09 MED ORDER — PIPERACILLIN-TAZOBACTAM 3.375 G IVPB 30 MIN: 3.3750 g | Freq: Once | INTRAVENOUS | Status: DC

## 2024-08-09 MED ORDER — NOREPINEPHRINE 4 MG/250ML-% IV SOLN
0.0000 ug/min | INTRAVENOUS | Status: DC
  Administered 2024-08-09: 5 ug/min via INTRAVENOUS

## 2024-08-09 NOTE — Progress Notes (Signed)
 eLink Physician-Brief Progress Note Patient Name: MAGALENE MCLEAR DOB: 02-10-1951 MRN: [id number]   Date of Service  08/09/2024  HPI/Events of Note  ABG reviewed.  eICU Interventions  Ventilator changes ordered.        Aideen Fenster U Arnika Larzelere 08/09/2024, 8:52 PM
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
## 2024-08-09 NOTE — ED Provider Notes (Signed)
 " [place] [facility] Provider Note   CSN: [id number] Arrival date & time: 08/09/24  1544     Patient presents with: No chief complaint on file.   ALEASE Freeman is a 74 y.o. female.   The history is provided by the EMS personnel. The history is limited by the condition of the patient.  Cardiac Arrest Witnessed by:  Lyanne and friend Incident location:  Home Time since incident:  30 minutes Time before BLS initiated:  3-5 minutes Condition upon EMS arrival:  Unresponsive Pulse:  Absent Initial cardiac rhythm per EMS:  PEA Treatments prior to arrival:  ACLS protocol Airway:  Intubation in ED (king airway in field) Rhythm on admission to ED:  Normal sinus     Prior to Admission medications  Medication Sig Start Date End Date Taking? Authorizing Provider  albuterol  (PROVENTIL  HFA;VENTOLIN  HFA) 108 (90 Base) MCG/ACT inhaler Inhale 2 puffs into the lungs every 4 (four) hours as needed for shortness of breath (Asthma). 12/15/17   [provider]  ALPRAZolam  (XANAX ) 1 MG tablet Take 1 mg by mouth at bedtime. 10/22/15   [provider]  amLODipine  (NORVASC ) 2.5 MG tablet Take 1 tablet (2.5 mg total) by mouth daily. 05/16/24   Debera Jayson MATSU, MD  atorvastatin  (LIPITOR ) 80 MG tablet TAKE 1 TABLET BY MOUTH EVERY DAY 01/10/24   Debera Jayson MATSU, MD  BIOTIN PO Take 1 tablet by mouth daily.    [provider]  Blood Pressure Monitoring (OMRON 3 SERIES BP MONITOR) DEVI 1 Device by Does not apply route daily. 06/22/24   Debera Jayson MATSU, MD  Cholecalciferol (VITAMIN D -3) 125 MCG (5000 UT) TABS Take 10,000 Units by mouth daily.    [provider]  dicyclomine  (BENTYL ) 10 MG capsule TAKE 1 CAPSULE (10 MG TOTAL) BY MOUTH 3 (THREE) TIMES DAILY BEFORE MEALS. 01/03/24   Legrand Victory LITTIE DOUGLAS, MD  diphenoxylate -atropine  (LOMOTIL ) 2.5-0.025 MG tablet Take 1 tablet by mouth 3 (three) times daily as needed for diarrhea or loose  stools. 10/15/23   Legrand Victory LITTIE DOUGLAS, MD  DULoxetine  (CYMBALTA ) 60 MG capsule Take 60 mg by mouth daily. 08/15/15   [provider]  HYDROcodone -acetaminophen  (NORCO/VICODIN) 5-325 MG tablet Take 1 tablet by mouth 2 (two) times daily. 10/22/15   [provider]  isosorbide  mononitrate (IMDUR ) 60 MG 24 hr tablet TAKE 1 TABLET BY MOUTH EVERY DAY 06/21/24   Debera Jayson MATSU, MD  loratadine  (CLARITIN ) 10 MG tablet Take 10 mg by mouth daily.    [provider]  losartan  (COZAAR ) 100 MG tablet Take 1 tablet (100 mg total) by mouth daily. 06/22/24 09/20/24  Debera Jayson MATSU, MD  metoprolol  tartrate (LOPRESSOR ) 25 MG tablet TAKE 1 TABLET BY MOUTH TWICE A DAY 01/27/24   Debera Jayson MATSU, MD  MISC NATURAL PRODUCTS PO Take 1 tablet by mouth daily at 6 (six) AM. Renew Liver    [provider]  montelukast  (SINGULAIR ) 10 MG tablet Take 10 mg by mouth at bedtime.  08/15/15   [provider]  nitroGLYCERIN  (NITROSTAT ) 0.4 MG SL tablet Place 1 tablet (0.4 mg total) under the tongue every 5 (five) minutes as needed for chest pain. 07/11/24   Debera Jayson MATSU, MD  NOVOLOG  FLEXPEN 100 UNIT/ML FlexPen Inject 0-30 Units into the skin 3 (three) times daily as needed for high blood sugar (If blood glucose is above 200). 12/31/20   [provider]  omeprazole  (PRILOSEC) 40 MG  capsule TAKE 1 CAPSULE BY MOUTH TWICE A DAY 10/28/23   Legrand Victory CROME III, MD  ondansetron  (ZOFRAN ) 4 MG tablet TAKE 1 TABLET BY MOUTH EVERY 8 HOURS AS NEEDED FOR NAUSEA AND VOMITING Patient taking differently: Taking 1/2 at bedtime 09/01/21   Legrand Victory CROME DOUGLAS, MD  [facility] VERIO test strip 1 each 3 (three) times daily. 12/04/22   [provider]  promethazine  (PHENERGAN ) 25 MG tablet Take 25 mg by mouth every 6 (six) hours as needed for nausea or vomiting.    [provider]  traZODone  (DESYREL ) 50 MG tablet Take 150 mg by mouth at bedtime. 08/15/15   [provider]   TRESIBA  FLEXTOUCH 200 UNIT/ML FlexTouch Pen Inject 40 Units into the skin daily. As directed Patient taking differently: Inject 45 Units into the skin 2 (two) times daily. As directed Between novolog  03/08/21   Fairy Frames, MD    Allergies: Erythromycin base, Zolpidem tartrate, Azithromycin, Other, Pollen extract, Remeron [mirtazapine], Sulfonamide derivatives, and Tree extract    Review of Systems  Unable to perform ROS: Patient unresponsive (Unresponsive and intubated with a GCS of 3T)    Updated Vital Signs BP (!) 84/36   Pulse 76   Temp (!) 97.3 F (36.3 C)   Resp (!) 28   SpO2 95%   Physical Exam Constitutional:      General: She is in acute distress.     Appearance: She is ill-appearing. She is not toxic-appearing or diaphoretic.  HENT:     Head: Normocephalic and atraumatic.     Nose: No congestion or rhinorrhea.     Mouth/Throat:     Mouth: Mucous membranes are moist.     Pharynx: No oropharyngeal exudate or posterior oropharyngeal erythema.  Eyes:     Conjunctiva/sclera: Conjunctivae normal.  Cardiovascular:     Rate and Rhythm: Normal rate.  Pulmonary:     Breath sounds: No stridor. Rhonchi present.  Chest:     Chest wall: No tenderness.  Abdominal:     Tenderness: There is no abdominal tenderness.  Musculoskeletal:        General: No tenderness.     Cervical back: No tenderness.  Skin:    Findings: Bruising (on chest from cpr) present. No erythema.  Neurological:     Mental Status: She is unresponsive.     GCS: GCS eye subscore is 1. GCS verbal subscore is 1. GCS motor subscore is 1.     (all labs ordered are listed, but only abnormal results are displayed) Labs Reviewed  RESP PANEL BY RT-PCR (RSV, FLU A&B, COVID)  RVPGX2 - Abnormal; Notable for the following components:      Result Value   Influenza A by PCR POSITIVE (*)    All other components within normal limits  CBC WITH DIFFERENTIAL/PLATELET - Abnormal; Notable for the following  components:   WBC 87.4 (*)    RBC 3.84 (*)    MCV 104.4 (*)    Neutro Abs 10.1 (*)    Lymphs Abs 65.6 (*)    Monocytes Absolute 11.1 (*)    Basophils Absolute 0.2 (*)    Abs Immature Granulocytes 0.39 (*)    All other components within normal limits  COMPREHENSIVE METABOLIC PANEL WITH GFR - Abnormal; Notable for the following components:   Sodium 131 (*)    Chloride 97 (*)    CO2 13 (*)    Glucose, Bld 252 (*)    Calcium  8.2 (*)    Total Protein  6.4 (*)    AST 68 (*)    Anion gap 20 (*)    All other components within normal limits  PRO BRAIN NATRIURETIC PEPTIDE - Abnormal; Notable for the following components:   Pro Brain Natriuretic Peptide 1,150.0 (*)    All other components within normal limits  URINALYSIS, W/ REFLEX TO CULTURE (INFECTION SUSPECTED) - Abnormal; Notable for the following components:   APPearance HAZY (*)    Glucose, UA 150 (*)    Hgb urine dipstick SMALL (*)    Protein, ur 100 (*)    Bacteria, UA RARE (*)    All other components within normal limits  HEMOGLOBIN A1C - Abnormal; Notable for the following components:   Hgb A1c MFr Bld 6.9 (*)    All other components within normal limits  LACTIC ACID, PLASMA - Abnormal; Notable for the following components:   Lactic Acid, Venous 3.5 (*)    All other components within normal limits  BASIC METABOLIC PANEL WITH GFR - Abnormal; Notable for the following components:   Sodium 129 (*)    Chloride 96 (*)    CO2 20 (*)    Glucose, Bld 278 (*)    Calcium  8.1 (*)    All other components within normal limits  GLUCOSE, CAPILLARY - Abnormal; Notable for the following components:   Glucose-Capillary 267 (*)    All other components within normal limits  I-STAT CG4 LACTIC ACID, ED - Abnormal; Notable for the following components:   Lactic Acid, Venous 8.5 (*)    All other components within normal limits  I-STAT CHEM 8, ED - Abnormal; Notable for the following components:   Sodium 133 (*)    Glucose, Bld 249 (*)     Calcium , Ion 1.13 (*)    TCO2 19 (*)    All other components within normal limits  CBG MONITORING, ED - Abnormal; Notable for the following components:   Glucose-Capillary 242 (*)    All other components within normal limits  I-STAT ARTERIAL BLOOD GAS, ED - Abnormal; Notable for the following components:   pH, Arterial 7.184 (*)    pCO2 arterial 49.0 (*)    pO2, Arterial 407 (*)    Bicarbonate 18.9 (*)    TCO2 21 (*)    Acid-base deficit 10.0 (*)    Sodium 131 (*)    All other components within normal limits  POCT I-STAT 7, (LYTES, BLD GAS, ICA,H+H) - Abnormal; Notable for the following components:   pH, Arterial 7.254 (*)    pO2, Arterial 396 (*)    Bicarbonate 19.8 (*)    TCO2 21 (*)    Acid-base deficit 8.0 (*)    Sodium 131 (*)    HCT 34.0 (*)    Hemoglobin 11.6 (*)    All other components within normal limits  POCT I-STAT 7, (LYTES, BLD GAS, ICA,H+H) - Abnormal; Notable for the following components:   pH, Arterial 7.284 (*)    pO2, Arterial 316 (*)    Bicarbonate 18.7 (*)    TCO2 20 (*)    Acid-base deficit 8.0 (*)    Sodium 131 (*)    HCT 33.0 (*)    Hemoglobin 11.2 (*)    All other components within normal limits  TROPONIN T, HIGH SENSITIVITY - Abnormal; Notable for the following components:   Troponin T High Sensitivity 20 (*)    All other components within normal limits  TROPONIN T, HIGH SENSITIVITY - Abnormal; Notable for the following components:  Troponin T High Sensitivity 52 (*)    All other components within normal limits  MRSA NEXT GEN BY PCR, NASAL  CULTURE, BLOOD (ROUTINE X 2)  CULTURE, BLOOD (ROUTINE X 2)  CULTURE, RESPIRATORY W GRAM STAIN  MAGNESIUM   TSH  TRIGLYCERIDES  CBC  COMPREHENSIVE METABOLIC PANEL WITH GFR  MAGNESIUM   PHOSPHORUS  LACTIC ACID, PLASMA  BLOOD GAS, ARTERIAL  BLOOD GAS, ARTERIAL  LACTIC ACID, PLASMA  PATHOLOGIST SMEAR REVIEW  I-STAT CG4 LACTIC ACID, ED    EKG: EKG Interpretation Date/Time:  Wednesday August 09 2024  16:21:48 EST Ventricular Rate:  77 PR Interval:  148 QRS Duration:  159 QT Interval:  482 QTC Calculation: 546 R Axis:   70  Text Interpretation: Sinus rhythm Right bundle branch block when compared to prior, similar appearance No STEMI Confirmed by Ginger Barefoot ([id number]) on 08/09/2024 4:31:55 PM  Radiology: ARCOLA Abd Portable 1V Result Date: 08/09/2024 EXAM: 1 VIEW XRAY OF THE ABDOMEN 08/09/2024 09:55:16 PM COMPARISON: None available. CLINICAL HISTORY: Encounter for imaging study to confirm orogastric (OG) tube placement. FINDINGS: LINES, TUBES AND DEVICES: OG tube tip in the mid stomach. BOWEL: Nonobstructive bowel gas pattern. SOFT TISSUES: No abnormal calcifications. BONES: No acute fracture. IMPRESSION: 1. OG tube tip in the mid stomach. Electronically signed by: Franky Crease MD 08/09/2024 10:05 PM EST RP Workstation: [id number]   DG Chest Port 1 View Result Date: 08/09/2024 EXAM: 1 VIEW(S) XRAY OF THE CHEST 08/09/2024 07:14:00 PM COMPARISON: 08/09/2024 CLINICAL HISTORY: Encounter for central line placement FINDINGS: LINES, TUBES AND DEVICES: Endotracheal tube in place with tip 3.3 cm above the carina. Left IJ central venous catheter in place with tip at the superior vena cava. LUNGS AND PLEURA: Patchy opacities in the left mid and lower lung zones. Patchy opacities also present in the right lung base. No pleural effusion. No pneumothorax. HEART AND MEDIASTINUM: Aortic atherosclerosis. No acute abnormality of the cardiac and mediastinal silhouettes. BONES AND SOFT TISSUES: No acute osseous abnormality. IMPRESSION: 1. Left IJ central venous catheter tip at the superior vena cava. No pneumothorax. 2. Endotracheal tube tip 3.3 cm above the carina. 3. No significant interval change in the lungs. Electronically signed by: Rogelia Myers MD 08/09/2024 07:21 PM EST RP Workstation: [id number]   ECHOCARDIOGRAM COMPLETE Result Date: 08/09/2024    ECHOCARDIOGRAM REPORT   Patient Name:   [name] Date of  Exam: 08/09/2024 Medical Rec #:  [id number]     Height:       62.0 in Accession #:    [id number]    Weight:       156.2 lb Date of Birth:  01-08-1951      BSA:          1.721 m Patient Age:    73 years      BP:           120/54 mmHg Patient Gender: F             HR:           69 bpm. Exam Location:  Inpatient Procedure: 2D Echo, Cardiac Doppler and Color Doppler (Both Spectral and Color            Flow Doppler were utilized during procedure). STAT ECHO Indications:    Cardiac Arrest  History:        Patient has prior history of Echocardiogram examinations, most                 recent 01/22/2023. Cardiac Arrest, CAD;  Risk Factors:Dyslipidemia,                 Sleep Apnea and Hypertension.  Sonographer:    Koleen Popper RDCS Referring Phys: [id number] [facility]  Sonographer Comments: Echo performed with patient supine and on artificial respirator. IMPRESSIONS  1. Left ventricular ejection fraction, by estimation, is 55 to 60%. The left ventricle has normal function. The left ventricle has no regional wall motion abnormalities. There is mild concentric left ventricular hypertrophy. Left ventricular diastolic parameters are consistent with Grade II diastolic dysfunction (pseudonormalization). Elevated left ventricular end-diastolic pressure.  2. Right ventricular systolic function is normal. The right ventricular size is normal. Tricuspid regurgitation signal is inadequate for assessing PA pressure.  3. A small pericardial effusion is present. The pericardial effusion is anterior to the right ventricle.  4. The mitral valve is normal in structure. Mild mitral valve regurgitation. No evidence of mitral stenosis.  5. The aortic valve is tricuspid. Aortic valve regurgitation is not visualized. No aortic stenosis is present.  6. The inferior vena cava is dilated in size with <50% respiratory variability, suggesting right atrial pressure of 15 mmHg. FINDINGS  Left Ventricle: Left ventricular ejection fraction, by estimation,  is 55 to 60%. The left ventricle has normal function. The left ventricle has no regional wall motion abnormalities. The left ventricular internal cavity size was normal in size. There is  mild concentric left ventricular hypertrophy. Left ventricular diastolic parameters are consistent with Grade II diastolic dysfunction (pseudonormalization). Elevated left ventricular end-diastolic pressure. Right Ventricle: The right ventricular size is normal. No increase in right ventricular wall thickness. Right ventricular systolic function is normal. Tricuspid regurgitation signal is inadequate for assessing PA pressure. Left Atrium: Left atrial size was normal in size. Right Atrium: Right atrial size was normal in size. Pericardium: A small pericardial effusion is present. The pericardial effusion is anterior to the right ventricle. Presence of epicardial fat layer. Mitral Valve: The mitral valve is normal in structure. Mild mitral valve regurgitation. No evidence of mitral valve stenosis. Tricuspid Valve: The tricuspid valve is normal in structure. Tricuspid valve regurgitation is not demonstrated. No evidence of tricuspid stenosis. Aortic Valve: The aortic valve is tricuspid. Aortic valve regurgitation is not visualized. No aortic stenosis is present. Pulmonic Valve: The pulmonic valve was not well visualized. Pulmonic valve regurgitation is not visualized. No evidence of pulmonic stenosis. Aorta: The aortic root is normal in size and structure. Venous: The inferior vena cava is dilated in size with less than 50% respiratory variability, suggesting right atrial pressure of 15 mmHg. IAS/Shunts: No atrial level shunt detected by color flow Doppler.  LEFT VENTRICLE PLAX 2D LVIDd:         4.10 cm     Diastology LVIDs:         2.50 cm     LV e' medial:    5.19 cm/s LV PW:         1.10 cm     LV E/e' medial:  20.2 LV IVS:        1.50 cm     LV e' lateral:   6.00 cm/s LVOT diam:     2.00 cm     LV E/e' lateral: 17.5 LV SV:          102 LV SV Index:   60 LVOT Area:     3.14 cm  LV Volumes (MOD) LV vol d, MOD A4C: 79.0 ml LV vol s, MOD A4C: 33.6 ml LV SV MOD A4C:  79.0 ml RIGHT VENTRICLE            IVC RV Basal diam:  3.60 cm    IVC diam: 2.70 cm RV S prime:     9.41 cm/s TAPSE (M-mode): 2.2 cm LEFT ATRIUM             Index        RIGHT ATRIUM           Index LA diam:        3.20 cm 1.86 cm/m   RA Area:     11.30 cm LA Vol (A2C):   28.6 ml 16.62 ml/m  RA Volume:   26.40 ml  15.34 ml/m LA Vol (A4C):   38.4 ml 22.31 ml/m LA Biplane Vol: 34.1 ml 19.81 ml/m  AORTIC VALVE LVOT Vmax:   131.00 cm/s LVOT Vmean:  90.100 cm/s LVOT VTI:    0.326 m  AORTA Ao Root diam: 2.90 cm Ao Asc diam:  3.20 cm MITRAL VALVE MV Area (PHT): 3.21 cm     SHUNTS MV Decel Time: 236 msec     Systemic VTI:  0.33 m MR Peak grad: 40.4 mmHg     Systemic Diam: 2.00 cm MR Vmax:      318.00 cm/s MV E velocity: 105.00 cm/s MV A velocity: 112.00 cm/s MV E/A ratio:  0.94 Kardie Tobb DO Electronically signed by Dub Huntsman DO Signature Date/Time: 08/09/2024/6:37:44 PM    Final    CT Head Wo Contrast Result Date: 08/09/2024 EXAM: CT HEAD WITHOUT CONTRAST 08/09/2024 06:17:34 PM TECHNIQUE: CT of the head was performed without the administration of intravenous contrast. Automated exposure control, iterative reconstruction, and/or weight based adjustment of the mA/kV was utilized to reduce the radiation dose to as low as reasonably achievable. COMPARISON: None available. CLINICAL HISTORY: post arrrest FINDINGS: BRAIN AND VENTRICLES: No acute hemorrhage. No evidence of acute infarct. No hydrocephalus. No extra-axial collection. No mass effect or midline shift. Atrophy and chronic small vessel disease throughout the deep white matter. ORBITS: No acute abnormality. SINUSES: No acute abnormality. SOFT TISSUES AND SKULL: No acute soft tissue abnormality. No skull fracture. IMPRESSION: 1. No acute intracranial abnormality. Electronically signed by: Franky Crease MD 08/09/2024 06:26 PM  EST RP Workstation: [id number]   CT ABDOMEN PELVIS W CONTRAST Result Date: 08/09/2024 EXAM: CT ABDOMEN AND PELVIS WITH CONTRAST 08/09/2024 06:17:34 PM TECHNIQUE: CT of the abdomen and pelvis was performed with the administration of 75 mL of iohexol  (OMNIPAQUE ) 350 MG/ML injection. Multiplanar reformatted images are provided for review. Automated exposure control, iterative reconstruction, and/or weight-based adjustment of the mA/kV was utilized to reduce the radiation dose to as low as reasonably achievable. COMPARISON: 03/01/2021 CLINICAL HISTORY: Sepsis FINDINGS: LOWER CHEST: No acute abnormality. LIVER: The liver is unremarkable. GALLBLADDER AND BILE DUCTS: Prior cholecystectomy. No biliary ductal dilatation. SPLEEN: No acute abnormality. PANCREAS: No acute abnormality. ADRENAL GLANDS: No acute abnormality. KIDNEYS, URETERS AND BLADDER: No stones in the kidneys or ureters. No hydronephrosis. No perinephric or periureteral stranding. Foley catheter within the bladder. GI AND BOWEL: Stomach demonstrates no acute abnormality. Sigmoid diverticulosis. No active diverticulitis. There is no bowel obstruction. PERITONEUM AND RETROPERITONEUM: No ascites. No free air. VASCULATURE: Aorta is normal in caliber. Aortic atherosclerosis. LYMPH NODES: No lymphadenopathy. REPRODUCTIVE ORGANS: Prior hysterectomy. Left ovarian cyst measures 3.6 x 1.5 cm compared to 2.5 x 1.3 cm previously. BONES AND SOFT TISSUES: No acute osseous abnormality. No focal soft tissue abnormality. IMPRESSION: 1. No acute findings in the abdomen or pelvis.  2. Left ovarian cyst measures 3.6 x 1.5 cm, increased from 2.5 x 1.3 cm previously; recommend follow-up pelvic ultrasound in 6-12 months. Electronically signed by: Franky Crease MD 08/09/2024 06:26 PM EST RP Workstation: [id number]   CT Angio Chest Pulmonary Embolism (PE) W or WO Contrast Result Date: 08/09/2024 EXAM: CTA of the Chest with contrast for PE 08/09/2024 06:17:34 PM TECHNIQUE: CTA of the  chest was performed after the administration of 75 mL of iohexol  (OMNIPAQUE ) 350 MG/ML injection. Multiplanar reformatted images are provided for review. MIP images are provided for review. Automated exposure control, iterative reconstruction, and/or weight based adjustment of the mA/kV was utilized to reduce the radiation dose to as low as reasonably achievable. COMPARISON: 12/28/2017 CLINICAL HISTORY: Pulmonary embolism (PE) suspected, high prob. FINDINGS: PULMONARY ARTERIES: Pulmonary arteries are adequately opacified for evaluation. No pulmonary embolism. Main pulmonary artery is normal in caliber. MEDIASTINUM: The heart and pericardium demonstrate coronary artery atherosclerosis. There is aortic atherosclerosis. 1.9 cm nodule in the anterior mediastinum is not significantly changed since the prior study and is compatible with a benign nodule. LYMPH NODES: No mediastinal, hilar or axillary lymphadenopathy. LUNGS AND PLEURA: Endotracheal tube tip is in the mid trachea. Layering fluid within the trachea and central airways bilaterally. Bilateral airspace opacities concerning for pneumonia. No pleural effusions. No pneumothorax. UPPER ABDOMEN: Limited images of the upper abdomen are unremarkable. SOFT TISSUES AND BONES: No acute bone or soft tissue abnormality. IMPRESSION: 1. No evidence of pulmonary embolism. 2. Bilateral airspace opacities concerning for pneumonia. 3. Endotracheal tube tip in the mid trachea. Layering fluid within the trachea and central airways bilaterally. Electronically signed by: Franky Crease MD 08/09/2024 06:23 PM EST RP Workstation: [id number]   DG Chest Portable 1 View Result Date: 08/09/2024 CLINICAL DATA:  Unresponsive CPR post arrest EXAM: PORTABLE CHEST 1 VIEW COMPARISON:  07/17/2022 FINDINGS: Endotracheal tube tip is about 4.1 cm superior to the carina. Aortic atherosclerosis. The heart is probably slightly enlarged. Central vascular congestion. No pleural effusion or pneumothorax.  IMPRESSION: 1. Endotracheal tube tip about 4.1 cm superior to the carina. 2. Suspected cardiomegaly with central vascular congestion. Electronically Signed   By: Luke Bun M.D.   On: 08/09/2024 16:14     Procedure Name: Intubation Date/Time: 08/09/2024 4:32 PM  Performed by: Ginger Lonni PARAS, MDPre-anesthesia Checklist: Patient identified, Emergency Drugs available, Suction available, Timeout performed and Patient being monitored Oxygen Delivery Method: Ambu bag Preoxygenation: Pre-oxygenation with 100% oxygen Induction Type: Rapid sequence Laryngoscope Size: Glidescope Grade View: Grade I Tube size: 7.5 mm Number of attempts: 1 Placement Confirmation: ETT inserted through vocal cords under direct vision, Positive ETCO2, CO2 detector and Breath sounds checked- equal and bilateral Secured at: 22 cm Tube secured with: ETT holder Dental Injury: Teeth and Oropharynx as per pre-operative assessment       CRITICAL CARE Performed by: Lonni PARAS Shanae Luo Total critical care time: 30 minutes Critical care time was exclusive of separately billable procedures and treating other patients. Critical care was necessary to treat or prevent imminent or life-threatening deterioration. Critical care was time spent personally by me on the following activities: development of treatment plan with patient and/or surrogate as well as nursing, discussions with consultants, evaluation of patient's response to treatment, examination of patient, obtaining history from patient or surrogate, ordering and performing treatments and interventions, ordering and review of laboratory studies, ordering and review of radiographic studies, pulse oximetry and re-evaluation of patient's condition.   Medications Ordered in the ED  propofol  (DIPRIVAN ) 1000 MG/100ML  infusion (has no administration in time range)  0.9 %  sodium chloride  infusion (has no administration in time range)  norepinephrine  (LEVOPHED ) 4mg  in  (0.016 mg/mL) premix infusion (5 mcg/min Intravenous New Bag/Given 08/09/24 1619)  propofol  (DIPRIVAN ) 1000 MG/100ML infusion (has no administration in time range)                                    Medical Decision Making Amount and/or Complexity of Data Reviewed Labs: ordered. Radiology: ordered.  Risk Prescription drug management. Decision regarding hospitalization.    [name] is a 74 y.o. female With a past medical history significant for hypertension, hyperlipidemia, sleep apnea, CAD, diabetes, previous GI bleed, CLL, cirrhosis, CKD, and osteoporosis who presents as a cardiac arrest.  According to EMS report, patient was at home and had a witnessed arrest outside the house but there was no reported trauma.  She reportedly collapsed and neighbor was a engineer, civil (consulting) and started CPR.  She had about 5 to 7 minutes of pre-EMS CPR and then about 10 minutes of CPR with EMS.  They say no medicines were given and they achieved ROSC in about 10 minutes.  No shocks needed.   They placed a King airway and patient was brought in for evaluation.  On arrival, GCS is 3.  Patient had coarse breath sounds bilaterally but was not protecting her airway with mental status.  Decision made to [employer] airway for an ET tube.  Patient intubated with RSI without difficulty.  Patient was not responding to painful stimuli but did not have significant trauma, she does not bruising on her chest from the CPR.  Otherwise she had intact pulses.  EMS reported that she may recently have had the flu and pneumonia but they are not certain.  They report her glucose was in the 200s.  After intubation, critical care was called to come see for admission.  Will get screening labs started, get postintubation x-ray, order CT of the head, and she will need admission by critical care due to the intubation.  Family has not yet arrived but will discuss with them when they get here as EMS reports they are on the  way.  4:21 PM I was informed patient is now hypotensive.  Will start pressors.     Final diagnoses:  Cardiac arrest [facility])      Clinical Impression: 1. Cardiac arrest [facility])     Disposition: Admit  This note was prepared with assistance of Dragon voice recognition software. Occasional wrong-word or sound-a-like substitutions may have occurred due to the inherent limitations of voice recognition software.      Buckley Bradly, Lonni PARAS, MD 08/09/24 2312  "

## 2024-08-09 NOTE — ED Triage Notes (Signed)
 PT bib EMS from home. Family pt witnessed unresponsive. Non traumatic. No injury. CPR started by family x 5 mins and EMS continued CPR for 10 mins, ROSC obtained. King airway in placed and bagging on arrival.   Dr. Ginger in room on arrival. [place] airway removed and pt intubated by [place]. Using glide scope 7.5 placed secured 22 at the lip. Positive color change and equal breath sounds heard by Dr. Ginger  Time out at 1547 Etomidate 20mg  at 15:48 Rocuronium 80mg  @1548 

## 2024-08-09 NOTE — ED Notes (Signed)
 Dr. Gatha speaking with family in consult room

## 2024-08-09 NOTE — Plan of Care (Signed)

## 2024-08-09 NOTE — ED Notes (Signed)
 Report given to [facility]

## 2024-08-09 NOTE — ED Notes (Signed)
 Critical Care providers at bedside for assessment and central line placement.

## 2024-08-09 NOTE — Progress Notes (Signed)
" °  Echocardiogram 2D Echocardiogram has been performed.  Koleen KANDICE Popper, RDCS 08/09/2024, 5:54 PM "

## 2024-08-09 NOTE — Progress Notes (Signed)
 eLink Physician-Brief Progress Note Patient Name: [name] DOB: 02-10-1951 MRN: [id number]   Date of Service  08/09/2024  HPI/Events of Note  Patient with myoclonic seizure post-cardiac arrest.  eICU Interventions  Keppra  + Depakote, cEEG, Consult neurology.        Seaton Hofmann U Tonya Wantz 08/09/2024, 9:17 PM
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
## 2024-08-09 NOTE — Procedures (Signed)
 Arterial Catheter Insertion Procedure Note  [name]  [id number]  02-03-1951  Date:08/09/24  Time:7:19 PM    Provider Performing: Keven Fila    Procedure: Insertion of Arterial Line (63379) with US  guidance (23062)   Indication(s) Blood pressure monitoring and/or need for frequent ABGs  Consent Unable to obtain consent due to emergent nature of procedure.  Anesthesia Fentanyl  and propofol    Time Out Verified patient identification, verified procedure, site/side was marked, verified correct patient position, special equipment/implants available, medications/allergies/relevant history reviewed, required imaging and test results available.   Sterile Technique Maximal sterile technique including full sterile barrier drape, hand hygiene, sterile gown, sterile gloves, mask, hair covering, sterile ultrasound probe cover (if used).   Procedure Description Area of catheter insertion was cleaned with chlorhexidine  and draped in sterile fashion. With real-time ultrasound guidance an arterial catheter was placed into the left radial artery.  Appropriate arterial tracings confirmed on monitor.     Complications/Tolerance None; patient tolerated the procedure well.   EBL Minimal   Specimen(s) None   Keven Fila, PA-C  Pulmonary & Critical Care Medicine For pager details, please see AMION or use Epic chat  After 1900, please call [facility] for cross coverage needs 08/09/2024, 7:20 PM

## 2024-08-09 NOTE — Progress Notes (Signed)
 Pharmacy Antibiotic Note  [name] is a 74 y.o. female for which pharmacy has been consulted for vancomycin  and zosyn  dosing for sepsis.  Patient with a history of HTN, HLD, CAD, DM, GIB, CLL, cirrhosis, CKD. Patient presenting as a cardiac arrest. ROSC achieved prior to arrival. Pt is intubated on vasopressors.  SCr 0.95 - baseline ~0.75 WBC 87.4; T 95.3; HR 71; RR 23 COVID neg / flu pos  Plan: Zosyn  3.375g IV q8h (4 hour infusion) Vancomycin  1500 mg once, subsequent dosing as indicated per random vancomycin  level until renal function stable and/or improved, at which time scheduled dosing can be considered Monitor WBC, fever, renal function, cultures De-escalate when able     Temp (24hrs), Avg:95.7 F (35.4 C), Min:95.3 F (35.2 C), Max:96 F (35.6 C)  Recent Labs  Lab 08/09/24 1545  WBC 87.4*  CREATININE 0.95    Estimated Creatinine Clearance: 47.2 mL/min (by C-G formula based on SCr of 0.95 mg/dL).    Allergies[1]  Microbiology results: Pending  Thank you for allowing pharmacy to be a part of this patients care.  Dorn Buttner, PharmD, BCPS 08/09/2024 5:24 PM ED Clinical Pharmacist -  [phone]      [1]  Allergies Allergen Reactions   Erythromycin Base Diarrhea   Zolpidem Tartrate Other (See Comments)    amnesia   Azithromycin Other (See Comments)    Upset stomach    Other Rash and Other (See Comments)    Grass, dust, green beans, corn, cabbage Reaction: upset stomach   Pollen Extract Cough    Runny nose   Remeron [Mirtazapine] Other (See Comments)    Makes her too sleepy   Sulfonamide Derivatives Rash    Mouth sores   Tree Extract Rash

## 2024-08-09 NOTE — Progress Notes (Signed)
 ABG results below. RR increased from 22 to 30. FIO2 decreased from 100% to 40%.   Latest Reference Range & Units 08/09/24 17:41  Sample type  ARTERIAL  pH, Arterial 7.35 - 7.45  7.184 (LL)  pCO2 arterial 32 - 48 mmHg 49.0 (H)  pO2, Arterial 83 - 108 mmHg 407 (H)  TCO2 22 - 32 mmol/L 21 (L)  Acid-base deficit 0.0 - 2.0 mmol/L 10.0 (H)  Bicarbonate 20.0 - 28.0 mmol/L 18.9 (L)  O2 Saturation % 100  Patient temperature  95.3 F  Collection site  RADIAL, ALLEN'S TEST ACCEPTABLE  (LL): Data is critically low (H): Data is abnormally high (L): Data is abnormally low

## 2024-08-09 NOTE — Procedures (Signed)
 Central Venous Catheter Insertion Procedure Note  [name]  [id number]  [birth year]  Date:08/09/24  Time:5:27 PM   Provider Performing:Chikita Dogan Ruthine   Procedure: Insertion of Non-tunneled Central Venous Catheter(36556) with US  guidance (23062)   Indication(s) Medication administration  Consent Unable to obtain consent due to emergent nature of procedure.  Anesthesia Propofol , fentanyl   Timeout Verified patient identification, verified procedure, site/side was marked, verified correct patient position, special equipment/implants available, medications/allergies/relevant history reviewed, required imaging and test results available.  Sterile Technique Maximal sterile technique including full sterile barrier drape, hand hygiene, sterile gown, sterile gloves, mask, hair covering, sterile ultrasound probe cover (if used).  Procedure Description Area of catheter insertion was cleaned with chlorhexidine  and draped in sterile fashion.  With real-time ultrasound guidance a central venous catheter was placed into the left internal jugular vein. Nonpulsatile blood flow and easy flushing noted in all ports.  The catheter was sutured in place and sterile dressing applied.    Complications/Tolerance None; patient tolerated the procedure well. Chest X-ray is ordered to verify placement for internal jugular or subclavian cannulation.   Chest x-ray is not ordered for femoral cannulation.  EBL Minimal  Specimen(s) None  Keven Ruthine, PA-C [facility] For pager details, please see AMION or use Epic chat  After 1900, please call [facility] for cross coverage needs 08/09/2024, 5:28 PM

## 2024-08-10 ENCOUNTER — Inpatient Hospital Stay (HOSPITAL_COMMUNITY)

## 2024-08-10 DIAGNOSIS — R569 Unspecified convulsions: Secondary | ICD-10-CM

## 2024-08-10 DIAGNOSIS — G253 Myoclonus: Secondary | ICD-10-CM | POA: Diagnosis not present

## 2024-08-10 DIAGNOSIS — I469 Cardiac arrest, cause unspecified: Secondary | ICD-10-CM | POA: Diagnosis not present

## 2024-08-10 LAB — POCT I-STAT 7, (LYTES, BLD GAS, ICA,H+H)
Acid-base deficit: 6 mmol/L — ABNORMAL HIGH (ref 0.0–2.0)
Bicarbonate: 20.2 mmol/L (ref 20.0–28.0)
Calcium, Ion: 1.11 mmol/L — ABNORMAL LOW (ref 1.15–1.40)
HCT: 30 % — ABNORMAL LOW (ref 36.0–46.0)
Hemoglobin: 10.2 g/dL — ABNORMAL LOW (ref 12.0–15.0)
O2 Saturation: 100 %
Patient temperature: 98.2
Potassium: 3.7 mmol/L (ref 3.5–5.1)
Sodium: 131 mmol/L — ABNORMAL LOW (ref 135–145)
TCO2: 21 mmol/L — ABNORMAL LOW (ref 22–32)
pCO2 arterial: 41 mmHg (ref 32–48)
pH, Arterial: 7.3 — ABNORMAL LOW (ref 7.35–7.45)
pO2, Arterial: 233 mmHg — ABNORMAL HIGH (ref 83–108)

## 2024-08-10 LAB — COMPREHENSIVE METABOLIC PANEL WITH GFR
ALT: 29 U/L (ref 0–44)
AST: 52 U/L — ABNORMAL HIGH (ref 15–41)
Albumin: 3.1 g/dL — ABNORMAL LOW (ref 3.5–5.0)
Alkaline Phosphatase: 64 U/L (ref 38–126)
Anion gap: 16 — ABNORMAL HIGH (ref 5–15)
BUN: 18 mg/dL (ref 8–23)
CO2: 19 mmol/L — ABNORMAL LOW (ref 22–32)
Calcium: 8.1 mg/dL — ABNORMAL LOW (ref 8.9–10.3)
Chloride: 99 mmol/L (ref 98–111)
Creatinine, Ser: 1.06 mg/dL — ABNORMAL HIGH (ref 0.44–1.00)
GFR, Estimated: 55 mL/min — ABNORMAL LOW
Glucose, Bld: 196 mg/dL — ABNORMAL HIGH (ref 70–99)
Potassium: 3.4 mmol/L — ABNORMAL LOW (ref 3.5–5.1)
Sodium: 133 mmol/L — ABNORMAL LOW (ref 135–145)
Total Bilirubin: 0.7 mg/dL (ref 0.0–1.2)
Total Protein: 5.3 g/dL — ABNORMAL LOW (ref 6.5–8.1)

## 2024-08-10 LAB — MAGNESIUM: Magnesium: 1.4 mg/dL — ABNORMAL LOW (ref 1.7–2.4)

## 2024-08-10 LAB — GLUCOSE, CAPILLARY
Glucose-Capillary: 170 mg/dL — ABNORMAL HIGH (ref 70–99)
Glucose-Capillary: 185 mg/dL — ABNORMAL HIGH (ref 70–99)
Glucose-Capillary: 186 mg/dL — ABNORMAL HIGH (ref 70–99)
Glucose-Capillary: 194 mg/dL — ABNORMAL HIGH (ref 70–99)
Glucose-Capillary: 203 mg/dL — ABNORMAL HIGH (ref 70–99)
Glucose-Capillary: 207 mg/dL — ABNORMAL HIGH (ref 70–99)
Glucose-Capillary: 221 mg/dL — ABNORMAL HIGH (ref 70–99)
Glucose-Capillary: 239 mg/dL — ABNORMAL HIGH (ref 70–99)

## 2024-08-10 LAB — PHOSPHORUS: Phosphorus: 1.9 mg/dL — ABNORMAL LOW (ref 2.5–4.6)

## 2024-08-10 LAB — CBC
HCT: 30.6 % — ABNORMAL LOW (ref 36.0–46.0)
Hemoglobin: 10.4 g/dL — ABNORMAL LOW (ref 12.0–15.0)
MCH: 32.9 pg (ref 26.0–34.0)
MCHC: 34 g/dL (ref 30.0–36.0)
MCV: 96.8 fL (ref 80.0–100.0)
Platelets: 106 K/uL — ABNORMAL LOW (ref 150–400)
RBC: 3.16 MIL/uL — ABNORMAL LOW (ref 3.87–5.11)
RDW: 13.9 % (ref 11.5–15.5)
WBC: 12.4 K/uL — ABNORMAL HIGH (ref 4.0–10.5)
nRBC: 0 % (ref 0.0–0.2)

## 2024-08-10 LAB — TRIGLYCERIDES: Triglycerides: 174 mg/dL — ABNORMAL HIGH

## 2024-08-10 LAB — TROPONIN T, HIGH SENSITIVITY: Troponin T High Sensitivity: 62 ng/L — ABNORMAL HIGH (ref 0–19)

## 2024-08-10 LAB — LACTIC ACID, PLASMA: Lactic Acid, Venous: 4.2 mmol/L (ref 0.5–1.9)

## 2024-08-10 LAB — PATHOLOGIST SMEAR REVIEW

## 2024-08-10 MED ORDER — FENTANYL BOLUS VIA INFUSION
25.0000 ug | INTRAVENOUS | Status: DC | PRN
  Administered 2024-08-10 (×2): 25 ug via INTRAVENOUS
  Administered 2024-08-10: 50 ug via INTRAVENOUS

## 2024-08-10 MED ORDER — FAMOTIDINE 20 MG PO TABS
10.0000 mg | ORAL_TABLET | Freq: Every day | ORAL | Status: AC
  Administered 2024-08-11 – 2024-08-13 (×3): 10 mg
  Filled 2024-08-10 (×3): qty 1

## 2024-08-10 MED ORDER — ACETAMINOPHEN 160 MG/5ML PO SOLN
500.0000 mg | ORAL | Status: DC | PRN
  Administered 2024-08-10 – 2024-08-12 (×3): 500 mg
  Filled 2024-08-10 (×3): qty 20.3

## 2024-08-10 MED ORDER — THIAMINE MONONITRATE 100 MG PO TABS
100.0000 mg | ORAL_TABLET | Freq: Every day | ORAL | Status: AC
  Administered 2024-08-10 – 2024-08-13 (×4): 100 mg
  Filled 2024-08-10 (×4): qty 1

## 2024-08-10 MED ORDER — MIDODRINE HCL 5 MG PO TABS
5.0000 mg | ORAL_TABLET | Freq: Three times a day (TID) | ORAL | Status: DC
  Administered 2024-08-10: 5 mg
  Filled 2024-08-10: qty 1

## 2024-08-10 MED ORDER — POTASSIUM PHOSPHATES 15 MMOLE/5ML IV SOLN
30.0000 mmol | Freq: Once | INTRAVENOUS | Status: AC
  Administered 2024-08-10: 30 mmol via INTRAVENOUS
  Filled 2024-08-10 (×2): qty 10

## 2024-08-10 MED ORDER — MAGNESIUM SULFATE 4 GM/100ML IV SOLN
4.0000 g | Freq: Once | INTRAVENOUS | Status: AC
  Administered 2024-08-10: 4 g via INTRAVENOUS
  Filled 2024-08-10: qty 100

## 2024-08-10 MED ORDER — ORAL CARE MOUTH RINSE
15.0000 mL | OROMUCOSAL | Status: DC
  Administered 2024-08-10 – 2024-08-13 (×35): 15 mL via OROMUCOSAL

## 2024-08-10 MED ORDER — VITAL HP 1.0 CAL PO LIQD: 1000.0000 mL | ORAL | Status: DC

## 2024-08-10 MED ORDER — ORAL CARE MOUTH RINSE
15.0000 mL | OROMUCOSAL | Status: DC | PRN
  Administered 2024-08-12: 15 mL via OROMUCOSAL

## 2024-08-10 MED ORDER — LABETALOL HCL 5 MG/ML IV SOLN
10.0000 mg | Freq: Once | INTRAVENOUS | Status: AC
  Administered 2024-08-10: 10 mg via INTRAVENOUS
  Filled 2024-08-10: qty 4

## 2024-08-10 MED ORDER — HYDRALAZINE HCL 20 MG/ML IJ SOLN
10.0000 mg | INTRAMUSCULAR | Status: DC | PRN
  Administered 2024-08-10 – 2024-08-13 (×4): 10 mg via INTRAVENOUS
  Filled 2024-08-10 (×4): qty 1

## 2024-08-10 MED ORDER — LACTATED RINGERS IV BOLUS
500.0000 mL | Freq: Once | INTRAVENOUS | Status: AC
  Administered 2024-08-10: 500 mL via INTRAVENOUS

## 2024-08-10 MED ORDER — VITAL 1.5 CAL PO LIQD
1000.0000 mL | ORAL | Status: DC
  Administered 2024-08-10 – 2024-08-12 (×3): 1000 mL

## 2024-08-10 MED ORDER — INSULIN ASPART 100 UNIT/ML IJ SOLN
0.0000 [IU] | INTRAMUSCULAR | Status: AC
  Administered 2024-08-10: 5 [IU] via SUBCUTANEOUS
  Administered 2024-08-10 (×2): 3 [IU] via SUBCUTANEOUS
  Administered 2024-08-11: 5 [IU] via SUBCUTANEOUS
  Administered 2024-08-11 (×2): 8 [IU] via SUBCUTANEOUS
  Administered 2024-08-11 (×2): 5 [IU] via SUBCUTANEOUS
  Administered 2024-08-12: 8 [IU] via SUBCUTANEOUS
  Administered 2024-08-12: 11 [IU] via SUBCUTANEOUS
  Administered 2024-08-12 (×2): 8 [IU] via SUBCUTANEOUS
  Administered 2024-08-12: 5 [IU] via SUBCUTANEOUS
  Administered 2024-08-12 – 2024-08-13 (×5): 8 [IU] via SUBCUTANEOUS
  Filled 2024-08-10 (×2): qty 8
  Filled 2024-08-10: qty 5
  Filled 2024-08-10: qty 8
  Filled 2024-08-10: qty 3
  Filled 2024-08-10: qty 8
  Filled 2024-08-10: qty 11
  Filled 2024-08-10: qty 5
  Filled 2024-08-10 (×5): qty 8
  Filled 2024-08-10: qty 5
  Filled 2024-08-10: qty 8
  Filled 2024-08-10: qty 3
  Filled 2024-08-10: qty 8

## 2024-08-10 MED ORDER — HYDRALAZINE HCL 20 MG/ML IJ SOLN
10.0000 mg | INTRAMUSCULAR | Status: DC | PRN
  Administered 2024-08-10: 10 mg via INTRAVENOUS
  Filled 2024-08-10: qty 1

## 2024-08-10 MED ORDER — NOREPINEPHRINE 4 MG/250ML-% IV SOLN
0.0000 ug/min | INTRAVENOUS | Status: DC
  Administered 2024-08-10: 4 ug/min via INTRAVENOUS

## 2024-08-10 NOTE — Progress Notes (Signed)
 Targeted temperature management orders placed. Patient's fever is going down. Her current temperature is 36.9 (98.4). Dr. Norrine said to manage fever with ice packs before beginning TTM.

## 2024-08-10 NOTE — Progress Notes (Signed)
 NEUROLOGY CONSULT FOLLOW UP NOTE   Date of service: August 10, 2024 Patient Name: [name] MRN:  [id number] DOB:  10-24-1950  Interval Hx/subjective   Patient is seen in her room with no family at the bedside.  She remains on norepinephrine  to maintain blood pressure within parameters and has been febrile with Tmax of 102.6.  Neurological exam continues to be poor. Vitals   Vitals:   08/10/24 0645 08/10/24 0700 08/10/24 0800 08/10/24 0812  BP:      Pulse: 68 68 66 65  Resp: (!) 28 (!) 28 (!) 28 (!) 28  Temp: (!) 100.6 F (38.1 C) (!) 100.6 F (38.1 C) 99.7 F (37.6 C) 99.5 F (37.5 C)  TempSrc:    Bladder  SpO2: 98% 98% 95% 92%  Weight:         Body mass index is 30.65 kg/m.  Physical Exam   Constitutional: Intubated elderly patient in no acute distress Eyes: No scleral injection.  HENT: Endotracheal tube in place Head: Normocephalic.  Cardiovascular: Normal rate and regular rhythm.  Respiratory: Respirations synchronous with ventilator Skin: WDI.   Neurologic Examination   (Sedation with low-dose propofol  paused) pupils 2 mm, nonreactive to light, corneal reflexes absent, oculocephalic reflex absent, cough and gag absent, no response to noxious stimuli, does not respond to name or follow commands  Medications Current Medications[1]  Labs and Diagnostic Imaging   CBC:  Recent Labs  Lab 08/09/24 1545 08/09/24 1612 08/09/24 2157 08/10/24 0400  WBC 87.4*  --   --  12.4*  NEUTROABS 10.1*  --   --   --   HGB 12.3   < > 11.2* 10.4*  HCT 40.1   < > 33.0* 30.6*  MCV 104.4*  --   --  96.8  PLT 155  --   --  106*   < > = values in this interval not displayed.    Basic Metabolic Panel:  Lab Results  Component Value Date   NA 133 (L) 08/10/2024   K 3.4 (L) 08/10/2024   CO2 19 (L) 08/10/2024   GLUCOSE 196 (H) 08/10/2024   BUN 18 08/10/2024   CREATININE 1.06 (H) 08/10/2024   CALCIUM  8.1 (L) 08/10/2024   GFRNONAA 55 (L) 08/10/2024   GFRAA >60 12/29/2017    Lipid Panel:  Lab Results  Component Value Date   LDLCALC 82 12/29/2017   HgbA1c:  Lab Results  Component Value Date   HGBA1C 6.9 (H) 08/09/2024   INR  Lab Results  Component Value Date   INR 1.2 (H) 07/05/2024   APTT  Lab Results  Component Value Date   APTT 28 12/28/2017   CT Head without contrast(Personally reviewed): No acute abnormality  Repeat head CT Pending  MRI Brain(Personally reviewed): Pending  Continuous EEG 1/21 to 1/22:  - Burst suppression with highly epileptiform bursts, generalized - Burst attenuation, generalized This limited ceribell EEG initially showed evidence of epileptogenicity with generalized onset and increased risk of seizures.  Due to lack of video, unclear if patient had any jerking/seizures associated with epileptiform bursts. Additionally EEG was suggestive of severe to profound diffuse encephalopathy.   Assessment   [name] is a 74 y.o. female with a history of diabetes, hypertension and MI who presented after cardiac arrest with bystander CPR.  Total downtime was about 20 minutes.  She was noted to have myoclonus afterwards and was placed on Keppra  and valproic  acid.  [facility] EEG was connected, and she will be transition  to long-term EEG this morning.  EEG read demonstrates burst suppression with epileptiform bursts.  On exam with propofol  paused, she had no brainstem reflexes and no response to noxious stimuli.  Given prolonged downtime and poor exam, will obtain CT today to assess for cerebral edema.  Prognosis appears poor, so will need to have goals of care conversation with family following head CT.  Recommendations  -LTM EEG - Continue Keppra  1000 mg every 12 hours and Depakote 500 mg every 12 hours - Titrate propofol  to cessation of myoclonus - Repeat head CT today - Neurology will continue to follow ______________________________________________________________________ Patient seen by NP with MD, MD to edit note as  needed.  Signed, Cortney E Everitt Clint Kill, NP [facility]    Attending Neurohospitalist Addendum Patient seen and examined with APP/Resident. Agree with the history and physical as documented above. Agree with the plan as documented, which I helped formulate. I have independently reviewed the chart, obtained history, review of systems and examined the patient.I have personally reviewed pertinent head/neck/spine imaging (CT/MRI). Please feel free to call with any questions.  -- Eligio Lav, MD Neurologist [facility] Pager: [phone]  CRITICAL CARE ATTESTATION Performed by: Eligio Lav, MD Total critical care time: 31 minutes Critical care time was exclusive of separately billable procedures and treating other patients and/or supervising APPs/Residents/Students Critical care was necessary to treat or prevent imminent or life-threatening deterioration. This patient is critically ill and at significant risk for neurological worsening and/or death and care requires constant monitoring. Critical care was time spent personally by me on the following activities: development of treatment plan with patient and/or surrogate as well as nursing, discussions with consultants, evaluation of patient's response to treatment, examination of patient, obtaining history from patient or surrogate, ordering and performing treatments and interventions, ordering and review of laboratory studies, ordering and review of radiographic studies, pulse oximetry, re-evaluation of patient's condition, participation in multidisciplinary rounds and medical decision making of high complexity in the care of this patient.     [1]  Current Facility-Administered Medications:    0.9 %  sodium chloride  infusion, 250 mL, Intravenous, Continuous, Tegeler, Lonni PARAS, MD, Paused at 08/10/24 [id number]   acetaminophen  (TYLENOL ) 160 MG/5ML solution 500 mg, 500 mg, Per Tube, Q4H PRN, Ogan, Okoronkwo U, MD, 500  mg at 08/10/24 9685   Chlorhexidine  Gluconate Cloth 2 % PADS 6 each, 6 each, Topical, Daily, Mohammed, Shahid, MD   famotidine  (PEPCID ) tablet 20 mg, 20 mg, Per Tube, BID, Mohammed, Shahid, MD, 20 mg at 08/09/24 2142   fentaNYL  (SUBLIMAZE ) bolus via infusion 25-100 mcg, 25-100 mcg, Intravenous, Q1H PRN, Ogan, Okoronkwo U, MD, 50 mcg at 08/10/24 0136   fentaNYL  in NS (44mcg/ml) infusion-PREMIX, 0-400 mcg/hr, Intravenous, Continuous, Mohammed, Shahid, MD, Last Rate: 10 mL/hr at 08/10/24 0800, 100 mcg/hr at 08/10/24 0800   heparin  injection 5,000 Units, 5,000 Units, Subcutaneous, Q8H, Mohammed, Shahid, MD, 5,000 Units at 08/10/24 9476   hydrALAZINE  (APRESOLINE ) injection 10 mg, 10 mg, Intravenous, Q4H PRN, Ogan, Okoronkwo U, MD   insulin  aspart (novoLOG ) injection 0-9 Units, 0-9 Units, Subcutaneous, Q4H, Mohammed, Shahid, MD, 2 Units at 08/10/24 0809   lactated ringers  infusion, , Intravenous, Continuous, Mohammed, Shahid, MD, Last Rate: 125 mL/hr at 08/10/24 0800, Infusion Verify at 08/10/24 0800   levETIRAcetam  (KEPPRA ) undiluted injection 1,000 mg, 1,000 mg, Intravenous, Q12H, Ogan, Okoronkwo U, MD, 1,000 mg at 08/09/24 2141   midodrine  (PROAMATINE ) tablet 5 mg, 5 mg, Per Tube, TID WC, Ogan, Okoronkwo U,  MD, 5 mg at 08/10/24 0809   norepinephrine  (LEVOPHED ) 4mg  in (0.016 mg/mL) premix infusion, 0-10 mcg/min, Intravenous, Titrated, Ogan, Okoronkwo U, MD, Last Rate: 15 mL/hr at 08/10/24 0800, 4 mcg/min at 08/10/24 0800   oseltamivir  (TAMIFLU ) 6 MG/ML suspension 30 mg, 30 mg, Per Tube, BID, Mohammed, Shahid, MD, 30 mg at 08/09/24 2142   piperacillin -tazobactam (ZOSYN ) IVPB 3.375 g, 3.375 g, Intravenous, Once **FOLLOWED BY** piperacillin -tazobactam (ZOSYN ) IVPB 3.375 g, 3.375 g, Intravenous, Q8H, Mohammed, Shahid, MD, Stopped at 08/10/24 0406   polyethylene glycol (MIRALAX  / GLYCOLAX ) packet 17 g, 17 g, Per Tube, Daily PRN, Mohammed, Shahid, MD   potassium PHOSPHATE  30 mmol in dextrose   5 % 500 mL infusion, 30 mmol, Intravenous, Once, Ogan, Okoronkwo U, MD   propofol  (DIPRIVAN ) 1000 MG/100ML infusion, 0-80 mcg/kg/min (Order-Specific), Intravenous, Continuous, Tegeler, Lonni PARAS, MD, Last Rate: 14.4 mL/hr at 08/10/24 0800, 30 mcg/kg/min at 08/10/24 0800   senna (SENOKOT) tablet 8.6 mg, 1 tablet, Per Tube, BID PRN, Gatha, Shahid, MD   sodium bicarbonate  injection 100 mEq, 100 mEq, Intravenous, Once, Mohammed, Shahid, MD   valproate (DEPACON ) 500 mg in dextrose  5 % 50 mL IVPB, 500 mg, Intravenous, Q12H, Ogan, Okoronkwo U, MD, Stopped at 08/09/24 2256   vancomycin  variable dose per unstable renal function (pharmacist dosing), , Does not apply, See admin instructions, Gatha Pence, MD

## 2024-08-10 NOTE — Progress Notes (Signed)
 [facility] ADULT ICU REPLACEMENT PROTOCOL   The patient does apply for the [facility] Adult ICU Electrolyte Replacment Protocol based on the criteria listed below:   1.Exclusion criteria: TCTS, ECMO, Dialysis, and Myasthenia Gravis patients 2. Is GFR >/= 30 ml/min? Yes.    Patient's GFR today is 55 3. Is SCr </= 2? Yes.   Patient's SCr is 1.06 mg/dL 4. Did SCr increase >/= 0.5 in 24 hours? No. 5.Pt's weight >40kg  Yes.   6. Abnormal electrolyte(s):   K 3.4, Phos 1.9, Mg 1.4  7. Electrolytes replaced per protocol 8.  Call MD STAT for K+ </= 2.5, Phos </= 1, or Mag </= 1 Physician:  CLEMENTEEN Epimenio Blackbird R Shams Fill 08/10/2024 5:54 AM

## 2024-08-10 NOTE — Progress Notes (Signed)
 Pt transported by RN and RT from 2M13 to CT and back w/o complications.

## 2024-08-10 NOTE — Progress Notes (Signed)
 eLink Physician-Brief Progress Note Patient Name: MALANEY MCBEAN DOB: [birth year] MRN: [id number]   Date of Service  08/10/2024  HPI/Events of Note  BP 107/28, MAP 44, HR 70  eICU Interventions  Midodrine  added to augment diastolic blood pressure. Goal for Levophed  gtt changed to SBP > 100.        Tollie Canada U Harvis Mabus 08/10/2024, 6:14 AM

## 2024-08-10 NOTE — Inpatient Diabetes Management (Signed)
 Inpatient Diabetes Program Recommendations  AACE/ADA: New Consensus Statement on Inpatient Glycemic Control   Target Ranges:  Prepandial:   less than 140 mg/dL      Peak postprandial:   less than 180 mg/dL (1-2 hours)      Critically ill patients:  140 - 180 mg/dL   Lab Results  Component Value Date   GLUCAP 207 (H) 08/10/2024   HGBA1C 6.9 (H) 08/09/2024    Latest Reference Range & Units 08/09/24 20:07 08/09/24 23:59 08/10/24 03:51 08/10/24 07:56 08/10/24 11:57  Glucose-Capillary 70 - 99 mg/dL 732 (H) 796 (H) 814 (H) 170 (H) 207 (H)   Review of Glycemic Control  Diabetes history: DM2  Outpatient Diabetes medications:  Tresiba  45 units BID  Novolog  0-30 units TID   Current orders for Inpatient glycemic control:  Novolog  0-9 units Q4HRS   Inpatient Diabetes Program Recommendations:   Please consider starting Semglee  10 units daily   Thanks,  Lavanda Search, RN, MSN, [facility]  Inpatient Diabetes Coordinator  Pager [phone] (8a-5p)

## 2024-08-10 NOTE — Procedures (Addendum)
 Patient Name: [name]  MRN: [id number]  Epilepsy Attending: Arlin MALVA Krebs  Referring Physician/Provider: Ogan, Okoronkwo U, MD   Duration: 08/09/2024 9055 to 08/10/2024 9093  Patient history: 74 y.o. female with myoclonus after cardiac arrest. EEG to evaluate for seizure   Level of alertness: comatose  AEDs during EEG study: LEV, VPA, Propofol   Technical aspects: This EEG was obtained using a 10 lead EEG system positioned circumferentially without any parasagittal coverage (rapid EEG). Computer selected EEG is reviewed as  well as background features and all clinically significant events.  Description: At the beginning of the study, EEG showed burst suppression pattern with highly epileptiform bursts lasting 1 to 2 seconds alternating with 20 to 30 seconds of generalized EEG suppression. Gradually as medications were adjusted, EEG evolved into continuous generalized background attenuation.  Hyperventilation and photic stimulation were not performed.     ABNORMALITY - Burst suppression with highly epileptiform bursts, generalized - Burst attenuation, generalized  IMPRESSION: This limited ceribell EEG initially showed evidence of epileptogenicity with generalized onset and increased risk of seizures.  Due to lack of video, unclear if patient had any jerking/seizures associated with epileptiform bursts.  Additionally EEG was suggestive of severe to profound diffuse encephalopathy.  Please consider long-term EEG with video and full montage for further evaluation if needed  Camiah Humm O Sreekar Broyhill

## 2024-08-10 NOTE — Plan of Care (Signed)
  Problem: Education: Goal: Ability to describe self-care measures that may prevent or decrease complications (Diabetes Survival Skills Education) will improve Outcome: Not Progressing Goal: Individualized Educational Video(s) Outcome: Not Progressing   Problem: Coping: Goal: Ability to adjust to condition or change in health will improve Outcome: Not Progressing   Problem: Fluid Volume: Goal: Ability to maintain a balanced intake and output will improve Outcome: Not Progressing   Problem: Health Behavior/Discharge Planning: Goal: Ability to identify and utilize available resources and services will improve Outcome: Not Progressing Goal: Ability to manage health-related needs will improve Outcome: Not Progressing   Problem: Metabolic: Goal: Ability to maintain appropriate glucose levels will improve Outcome: Not Progressing   Problem: Nutritional: Goal: Maintenance of adequate nutrition will improve Outcome: Not Progressing Goal: Progress toward achieving an optimal weight will improve Outcome: Not Progressing   Problem: Skin Integrity: Goal: Risk for impaired skin integrity will decrease Outcome: Not Progressing   Problem: Tissue Perfusion: Goal: Adequacy of tissue perfusion will improve Outcome: Not Progressing   Problem: Education: Goal: Knowledge of General Education information will improve Description: Including pain rating scale, medication(s)/side effects and non-pharmacologic comfort measures Outcome: Not Progressing   Problem: Health Behavior/Discharge Planning: Goal: Ability to manage health-related needs will improve Outcome: Not Progressing   Problem: Clinical Measurements: Goal: Ability to maintain clinical measurements within normal limits will improve Outcome: Not Progressing Goal: Will remain free from infection Outcome: Not Progressing Goal: Diagnostic test results will improve Outcome: Not Progressing Goal: Respiratory complications will  improve Outcome: Not Progressing Goal: Cardiovascular complication will be avoided Outcome: Not Progressing   Problem: Activity: Goal: Risk for activity intolerance will decrease Outcome: Not Progressing   Problem: Nutrition: Goal: Adequate nutrition will be maintained Outcome: Not Progressing   Problem: Coping: Goal: Level of anxiety will decrease Outcome: Not Progressing   Problem: Elimination: Goal: Will not experience complications related to bowel motility Outcome: Not Progressing Goal: Will not experience complications related to urinary retention Outcome: Not Progressing   Problem: Pain Managment: Goal: General experience of comfort will improve and/or be controlled Outcome: Not Progressing   Problem: Safety: Goal: Ability to remain free from injury will improve Outcome: Not Progressing   Problem: Skin Integrity: Goal: Risk for impaired skin integrity will decrease Outcome: Not Progressing   Problem: Education: Goal: Ability to manage disease process will improve Outcome: Not Progressing   Problem: Cardiac: Goal: Ability to achieve and maintain adequate cardiopulmonary perfusion will improve Outcome: Not Progressing   Problem: Neurologic: Goal: Promote progressive neurologic recovery Outcome: Not Progressing   Problem: Skin Integrity: Goal: Risk for impaired skin integrity will be minimized. Outcome: Not Progressing

## 2024-08-10 NOTE — Progress Notes (Addendum)
 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  According to the husband patient apparently has been feeling unwell and has been having flulike symptoms for the last few days.  She was feeling generally weak she usually uses a walker but has been close to falling.  She was at home and husband heard a third went to see her.  She was not responding with eyes rolling back.  Her husband called her neighbor who is an [employer] who did not find a pulse and immediately started CPR.  After 5 minutes of bystander CPR EMS arrived and they did another 10 minutes of CPR.  The rhythm throughout was not a shockable rhythm it was presumed to be a PEA.  ROSC was achieved and patient was brought into the hospital with a King's airway.  This was exchanged to a ET tube in the emergency room.  In the ER patient was in sinus rhythm EKG showed right bundle branch block no ST T changes.  ABG showed metabolic acidosis and she was hypotensive needing 10 mcg/min of Levophed  when I went to see her.  Rest of the labs and imaging are pending.  Troponin is negative  Bedside POCUS done by me with the four-chamber apical view showed dilated LV with normal RV LV ratio.  There is no pericardial effusion.  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 s/p DES to LAD in 2014 b. cath in 12/2017 showing patent LAD stent with mild to moderate RCA and LCx disease c. cath in 04/2021 showing patent LAD stent with 60-70% RCA stenosis but not significant by FFR   Diverticulosis    Essential hypertension    Fatty liver    Fibromyalgia    GERD (gastroesophageal reflux disease)    H/O hiatal hernia    Heart murmur    IBS (irritable bowel syndrome)    Major depression in partial remission    Migraines    Mixed hyperlipidemia    Myocardial infarction (HCC)    2014   OSA on CPAP    Osteoporosis    Pancreatitis 2008   Pneumonia 1990's   Schatzki's ring    Sleep apnea    Type 2 diabetes mellitus (HCC)      Significant Hospital Events: Including procedures, antibiotic start and stop dates in addition to other pertinent events   08/09/2024.  Out-of-hospital cardiac arrest presented to the ER  Interim History / Subjective:  Propofol  increased for myoclonus Remains unresponsive on sedation Tmax 102.6 Pressors for SBP > 100  Objective    Blood pressure (!) 203/57, pulse 68, temperature (!) 100.6 F (38.1 C), resp. rate ROLLEN)  28, weight 76 kg, SpO2 98%.    Vent Mode: PRVC FiO2 (%):  [40 %-100 %] 40 % Set Rate:  [22 bmp-30 bmp] 28 bmp Vt Set:  [400 mL] 400 mL PEEP:  [5 cmH20] 5 cmH20 Plateau Pressure:  [21 cmH20-32 cmH20] 21 cmH20   Intake/Output Summary (Last 24 hours) at 08/10/2024 0800 Last data filed at 08/10/2024 0600 Gross per 24 hour  Intake 2059.98 ml  Output 900 ml  Net 1159.98 ml  900 uop x 12 hrs  Filed Weights   08/10/24 0500  Weight: 76 kg    Examination:   Physical exam: General: Crtitically ill-appearing female, orally intubated HEENT: [place]/AT, eyes anicteric.  ETT and cortrak in place Neuro: Sedated, not following commands.  Eyes are closed.  Both pupils are small, equal and sluggish reaction to light. No cough or corneal reflex on  sedation. 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 -normothermia -Neurology following -EEG, preliminary monitoring with ceribell suggests generalized onset epileptogenicity -propofol  for myoclonus suppression -repeat CT head - subtle signs of anoxic brain injury noted by radiologist -MRI 08/12/24 for further prognostication  PAD -fentanyl /propofol   Respiratory Acute respiratory failure with hypoxemia -Continue full vent support -LTVV with tidal volumes of 4-8 cc/kg ideal body weight -Goal plateau pressures of 30 and driving pressures of 15 -Wean PEEP/FiO2 for SpO2 92 -Follow intermittent CXR and ABG PRN -VAP bundle  Infectious Pneumonia Influenza A -tamiflu  -zosyn  empirically -discontinue vancomycin   Cardiovascular S/p PEA arrest Circulatory shock Type 2 MI Query hypoxic event leading to PEA arrest. Likely cardiogenic shock post-arrest. Preserved LVEF. Shock state improving throughout day. Wide pulse pressure suggests vasodilatory component, query sepsis contributing. 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 -transfuse < 7  Renal/metabolic AKI In setting of circulatory shock. Pressor support for systolic > 100. -in/out monitoring -renally dose meds -avoid nephrotoxins -continue LR 125 cc/h through today  Hypokalemia Hypophosphatemia Hypomagnesemia -monitor and replace as needed  Lactic acidosis In setting of shock -a.m. 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

## 2024-08-10 NOTE — Progress Notes (Signed)
 Initial Nutrition Assessment  DOCUMENTATION CODES:  Not applicable  INTERVENTION:  Initiate tube feeding via OGT. Start at trickle of 20 and hold until ok to advance. Goal rate is as follows: Vital 1.5 at 45 ml/h (1080 ml per day) When able to advance, increase to 35 and advance by 10mL every 12 hours to reach goal Prosource TF20 60 ml 1x/d Provides 1700 kcal, 93 gm protein, 825 ml free water daily Start thiamine  100mg  x 7 days  NUTRITION DIAGNOSIS:  Inadequate oral intake related to inability to eat as evidenced by NPO status.  GOAL:  Patient will meet greater than or equal to 90% of their needs  MONITOR:  TF tolerance, Vent status, Labs, I & O's  REASON FOR ASSESSMENT:  Ventilator, Consult Enteral/tube feeding initiation and management  ASSESSMENT:  Pt with hx of CLL, cirrhosis, HLD, HTN, GERD, DM type 2, IBS, CAD, CKD, and hx MI presented to ED after a out-of-hospital cardiac arrest.  Patient is currently intubated on ventilator support. MD talking with family at the time of assessment. Will defer interview and physical exam. Pt discussed during ICU rounds and with RN and MD. Pt being connected to EEG and underwent head CT this AM which was worrisome for anoxic brain injury.   No plans to extubate today, will start trickle feeds and monitor further imaging and workup.  Pt with low electrolytes already and being replaced. Adding thiamine  as feeds are being initiated.   MV: 10.8 L/min Temp (24hrs), Avg:98.7 F (37.1 C), Min:95.3 F (35.2 C), Max:102.6 F (39.2 C) MAP (art line): 55-67 mmHg  Propofol : 9.6 ml/hr (253 kcal/d)  Admit / Current weight: 76 kg    Intake/Output Summary (Last 24 hours) at 08/10/2024 1622 Last data filed at 08/10/2024 1300 Gross per 24 hour  Intake 3333.95 ml  Output 900 ml  Net 2433.95 ml  Net IO Since Admission: 2,433.95 mL [08/10/24 1622]  Drains/Lines: CVC Triple Lumen, left IJ Art Line, left radial  UOP x 24 hours OGT  (gastric)  Nutritionally Relevant Medications: Scheduled Meds:  famotidine   10 mg Per Tube Daily   VITAL HIGH PROTEIN  1,000 mL Per Tube Q24H   insulin  aspart  0-9 Units Subcutaneous Q4H   levETIRAcetam   1,000 mg Intravenous Q12H   sodium bicarbonate   100 mEq Intravenous Once   Continuous Infusions:  lactated ringers  125 mL/hr at 08/10/24 1300   piperacillin -tazobactam     potassium PHOSPHATE  IVPB (in mmol) 85 mL/hr at 08/10/24 1300   propofol  (DIPRIVAN ) infusion 20 mcg/kg/min (08/10/24 1300)   PRN Meds: polyethylene glycol, senna  Labs Reviewed: Na 133 Potassium 3.4 Creatinine 1.06 Phosphorus 1.9 Magnesium  1.4 CBG ranges from 170-267 mg/dL over the last 24 hours HgbA1c 6.9%  NUTRITION - FOCUSED PHYSICAL EXAM: Defer to in-person assessment  Diet Order:   Diet Order             Diet NPO time specified  Diet effective now                   EDUCATION NEEDS:  Not appropriate for education at this time  Skin:  Skin Assessment: Skin Integrity Issues: Abrasion to the face (2 x 1 cm)  Last BM:  unsure  Height:  Ht Readings from Last 1 Encounters:  08/10/24 5' 2 (1.575 m)    Weight:  Wt Readings from Last 1 Encounters:  08/10/24 76 kg    Ideal Body Weight:  50 kg  BMI:  Body mass index  is 30.65 kg/m.  Estimated Nutritional Needs:  Kcal:  1600-1800 kcal/d Protein:  80-95 g/d Fluid:  1.8L/d    Vernell Lukes, RD, LDN, CNSC Registered Dietitian II Please reach out via secure chat

## 2024-08-10 NOTE — Progress Notes (Signed)
 LTM EEG hooked up and running - no initial skin breakdown - push button tested - [facility] monitoring.

## 2024-08-10 NOTE — Progress Notes (Signed)
 Patient is not available for EEG lead placement at the moment due to going to imaging. Tech will try back as schedule allows.

## 2024-08-11 ENCOUNTER — Inpatient Hospital Stay (HOSPITAL_COMMUNITY)

## 2024-08-11 DIAGNOSIS — J1000 Influenza due to other identified influenza virus with unspecified type of pneumonia: Secondary | ICD-10-CM

## 2024-08-11 DIAGNOSIS — G931 Anoxic brain damage, not elsewhere classified: Secondary | ICD-10-CM | POA: Diagnosis not present

## 2024-08-11 DIAGNOSIS — G40909 Epilepsy, unspecified, not intractable, without status epilepticus: Secondary | ICD-10-CM | POA: Diagnosis not present

## 2024-08-11 DIAGNOSIS — S062X1A Diffuse traumatic brain injury with loss of consciousness of 30 minutes or less, initial encounter: Secondary | ICD-10-CM | POA: Diagnosis not present

## 2024-08-11 DIAGNOSIS — I21A1 Myocardial infarction type 2: Secondary | ICD-10-CM

## 2024-08-11 DIAGNOSIS — E1151 Type 2 diabetes mellitus with diabetic peripheral angiopathy without gangrene: Secondary | ICD-10-CM | POA: Diagnosis not present

## 2024-08-11 DIAGNOSIS — G253 Myoclonus: Secondary | ICD-10-CM | POA: Diagnosis not present

## 2024-08-11 DIAGNOSIS — R569 Unspecified convulsions: Secondary | ICD-10-CM | POA: Diagnosis not present

## 2024-08-11 DIAGNOSIS — R570 Cardiogenic shock: Secondary | ICD-10-CM

## 2024-08-11 DIAGNOSIS — I2510 Atherosclerotic heart disease of native coronary artery without angina pectoris: Secondary | ICD-10-CM | POA: Diagnosis not present

## 2024-08-11 DIAGNOSIS — D649 Anemia, unspecified: Secondary | ICD-10-CM

## 2024-08-11 DIAGNOSIS — I469 Cardiac arrest, cause unspecified: Secondary | ICD-10-CM | POA: Diagnosis not present

## 2024-08-11 DIAGNOSIS — N179 Acute kidney failure, unspecified: Secondary | ICD-10-CM

## 2024-08-11 DIAGNOSIS — J9601 Acute respiratory failure with hypoxia: Secondary | ICD-10-CM | POA: Diagnosis not present

## 2024-08-11 LAB — COMPREHENSIVE METABOLIC PANEL WITH GFR
ALT: 32 U/L (ref 0–44)
AST: 60 U/L — ABNORMAL HIGH (ref 15–41)
Albumin: 3 g/dL — ABNORMAL LOW (ref 3.5–5.0)
Alkaline Phosphatase: 86 U/L (ref 38–126)
Anion gap: 14 (ref 5–15)
BUN: 29 mg/dL — ABNORMAL HIGH (ref 8–23)
CO2: 19 mmol/L — ABNORMAL LOW (ref 22–32)
Calcium: 8.2 mg/dL — ABNORMAL LOW (ref 8.9–10.3)
Chloride: 97 mmol/L — ABNORMAL LOW (ref 98–111)
Creatinine, Ser: 1.69 mg/dL — ABNORMAL HIGH (ref 0.44–1.00)
GFR, Estimated: 31 mL/min — ABNORMAL LOW
Glucose, Bld: 230 mg/dL — ABNORMAL HIGH (ref 70–99)
Potassium: 3.8 mmol/L (ref 3.5–5.1)
Sodium: 130 mmol/L — ABNORMAL LOW (ref 135–145)
Total Bilirubin: 0.8 mg/dL (ref 0.0–1.2)
Total Protein: 5.2 g/dL — ABNORMAL LOW (ref 6.5–8.1)

## 2024-08-11 LAB — CBC
HCT: 29.1 % — ABNORMAL LOW (ref 36.0–46.0)
Hemoglobin: 9.8 g/dL — ABNORMAL LOW (ref 12.0–15.0)
MCH: 32.3 pg (ref 26.0–34.0)
MCHC: 33.7 g/dL (ref 30.0–36.0)
MCV: 96 fL (ref 80.0–100.0)
Platelets: 112 K/uL — ABNORMAL LOW (ref 150–400)
RBC: 3.03 MIL/uL — ABNORMAL LOW (ref 3.87–5.11)
RDW: 14.1 % (ref 11.5–15.5)
WBC: 22.7 K/uL — ABNORMAL HIGH (ref 4.0–10.5)
nRBC: 0 % (ref 0.0–0.2)

## 2024-08-11 LAB — PHOSPHORUS: Phosphorus: 3.8 mg/dL (ref 2.5–4.6)

## 2024-08-11 LAB — LACTIC ACID, PLASMA
Lactic Acid, Venous: 3.5 mmol/L (ref 0.5–1.9)
Lactic Acid, Venous: 2.9 mmol/L (ref 0.5–1.9)

## 2024-08-11 LAB — GLUCOSE, CAPILLARY
Glucose-Capillary: 209 mg/dL — ABNORMAL HIGH (ref 70–99)
Glucose-Capillary: 205 mg/dL — ABNORMAL HIGH (ref 70–99)
Glucose-Capillary: 258 mg/dL — ABNORMAL HIGH (ref 70–99)
Glucose-Capillary: 250 mg/dL — ABNORMAL HIGH (ref 70–99)
Glucose-Capillary: 256 mg/dL — ABNORMAL HIGH (ref 70–99)

## 2024-08-11 LAB — VALPROIC ACID LEVEL: Valproic Acid Lvl: 30 ug/mL — ABNORMAL LOW (ref 50–100)

## 2024-08-11 LAB — MAGNESIUM: Magnesium: 2.4 mg/dL (ref 1.7–2.4)

## 2024-08-11 MED ORDER — SENNA 8.6 MG PO TABS
1.0000 | ORAL_TABLET | Freq: Two times a day (BID) | ORAL | Status: DC
  Filled 2024-08-11: qty 1

## 2024-08-11 MED ORDER — VALPROATE SODIUM 100 MG/ML IV SOLN
500.0000 mg | Freq: Three times a day (TID) | INTRAVENOUS | Status: DC
  Administered 2024-08-11 – 2024-08-13 (×5): 500 mg via INTRAVENOUS
  Filled 2024-08-11 (×7): qty 5
  Filled 2024-08-11: qty 500

## 2024-08-11 MED ORDER — INSULIN GLARGINE-YFGN 100 UNIT/ML ~~LOC~~ SOLN
10.0000 [IU] | Freq: Every day | SUBCUTANEOUS | Status: DC
  Administered 2024-08-11: 10 [IU] via SUBCUTANEOUS
  Filled 2024-08-11 (×2): qty 0.1

## 2024-08-11 MED ORDER — LEVETIRACETAM (KEPPRA) 500 MG/5 ML ADULT IV PUSH
500.0000 mg | Freq: Two times a day (BID) | INTRAVENOUS | Status: DC
  Administered 2024-08-11 – 2024-08-12 (×3): 500 mg via INTRAVENOUS
  Filled 2024-08-11 (×3): qty 5

## 2024-08-11 MED ORDER — LACTATED RINGERS IV SOLN: INTRAVENOUS | Status: DC

## 2024-08-11 MED ORDER — POLYETHYLENE GLYCOL 3350 17 G PO PACK: 17.0000 g | PACK | Freq: Every day | ORAL | Status: DC

## 2024-08-11 MED ORDER — OSELTAMIVIR PHOSPHATE 6 MG/ML PO SUSR
30.0000 mg | Freq: Every day | ORAL | Status: DC
  Administered 2024-08-12: 30 mg
  Filled 2024-08-11: qty 12.5

## 2024-08-11 MED ORDER — VALPROATE SODIUM 100 MG/ML IV SOLN
500.0000 mg | Freq: Once | INTRAVENOUS | Status: AC
  Administered 2024-08-11: 500 mg via INTRAVENOUS
  Filled 2024-08-11: qty 500

## 2024-08-11 NOTE — Progress Notes (Signed)
 LTM maint complete - no skin breakdown under:  WU9,W1,X9

## 2024-08-11 NOTE — Plan of Care (Signed)
  Problem: Education: Goal: Ability to describe self-care measures that may prevent or decrease complications (Diabetes Survival Skills Education) will improve Outcome: Not Progressing Goal: Individualized Educational Video(s) Outcome: Not Progressing   Problem: Coping: Goal: Ability to adjust to condition or change in health will improve Outcome: Not Progressing   Problem: Fluid Volume: Goal: Ability to maintain a balanced intake and output will improve Outcome: Not Progressing   Problem: Health Behavior/Discharge Planning: Goal: Ability to identify and utilize available resources and services will improve Outcome: Not Progressing Goal: Ability to manage health-related needs will improve Outcome: Not Progressing   Problem: Metabolic: Goal: Ability to maintain appropriate glucose levels will improve Outcome: Not Progressing   Problem: Nutritional: Goal: Maintenance of adequate nutrition will improve Outcome: Not Progressing Goal: Progress toward achieving an optimal weight will improve Outcome: Not Progressing   Problem: Skin Integrity: Goal: Risk for impaired skin integrity will decrease Outcome: Not Progressing   Problem: Tissue Perfusion: Goal: Adequacy of tissue perfusion will improve Outcome: Not Progressing   Problem: Education: Goal: Knowledge of General Education information will improve Description: Including pain rating scale, medication(s)/side effects and non-pharmacologic comfort measures Outcome: Not Progressing   Problem: Health Behavior/Discharge Planning: Goal: Ability to manage health-related needs will improve Outcome: Not Progressing   Problem: Clinical Measurements: Goal: Ability to maintain clinical measurements within normal limits will improve Outcome: Not Progressing Goal: Will remain free from infection Outcome: Not Progressing Goal: Diagnostic test results will improve Outcome: Not Progressing Goal: Respiratory complications will  improve Outcome: Not Progressing Goal: Cardiovascular complication will be avoided Outcome: Not Progressing   Problem: Activity: Goal: Risk for activity intolerance will decrease Outcome: Not Progressing   Problem: Nutrition: Goal: Adequate nutrition will be maintained Outcome: Not Progressing   Problem: Coping: Goal: Level of anxiety will decrease Outcome: Not Progressing   Problem: Elimination: Goal: Will not experience complications related to bowel motility Outcome: Not Progressing Goal: Will not experience complications related to urinary retention Outcome: Not Progressing   Problem: Pain Managment: Goal: General experience of comfort will improve and/or be controlled Outcome: Not Progressing   Problem: Safety: Goal: Ability to remain free from injury will improve Outcome: Not Progressing   Problem: Skin Integrity: Goal: Risk for impaired skin integrity will decrease Outcome: Not Progressing   Problem: Education: Goal: Ability to manage disease process will improve Outcome: Not Progressing   Problem: Cardiac: Goal: Ability to achieve and maintain adequate cardiopulmonary perfusion will improve Outcome: Not Progressing   Problem: Neurologic: Goal: Promote progressive neurologic recovery Outcome: Not Progressing   Problem: Skin Integrity: Goal: Risk for impaired skin integrity will be minimized. Outcome: Not Progressing   Problem: Activity: Goal: Ability to tolerate increased activity will improve Outcome: Not Progressing   Problem: Respiratory: Goal: Ability to maintain a clear airway and adequate ventilation will improve Outcome: Not Progressing   Problem: Role Relationship: Goal: Method of communication will improve Outcome: Not Progressing

## 2024-08-11 NOTE — Procedures (Addendum)
 Patient Name: [name]  MRN: [id number]  Epilepsy Attending: Arlin MALVA Krebs  Referring Physician/Provider: Ogan, Okoronkwo U, MD  Duration: 08/10/2024 1214 to 08/11/2024 1214  Patient history: 74 y.o. female with myoclonus after cardiac arrest. EEG to evaluate for seizure    Level of alertness: comatose  AEDs during EEG study: LEV, VPA, Propofol    Technical aspects: This EEG study was done with scalp electrodes positioned according to the 10-20 International system of electrode placement. Electrical activity was reviewed with band pass filter of 1-70Hz , sensitivity of 7 uV/mm, display speed of 46mm/sec with a 60Hz  notched filter applied as appropriate. EEG data were recorded continuously and digitally stored.  Video monitoring was available and reviewed as appropriate.  Description:  EEG showed burst suppression pattern with high amplitude sharply contoured 3 to 6 Hz theta-delta slowing lasting 1 to 2 seconds alternating with 20 to 90 seconds of generalized EEG suppression. Hyperventilation and photic stimulation were not performed.     ABNORMALITY - Burst suppression, generalized  IMPRESSION: This study was suggestive of profound diffuse encephalopathy.  No seizures were noted during the study.  Nealie Mchatton O [name]

## 2024-08-11 NOTE — Progress Notes (Signed)
 "  NAME:  [name], MRN:  [id number], DOB:  [birth year], LOS: 2 ADMISSION DATE:  08/09/2024, CONSULTATION DATE:  08/09/2024 CHIEF COMPLAINT:  Cardiac Arrest   History of Present Illness:  This 74 year old woman has a history of CLL(not on treatment), coronary artery disease with a stent placed a decade ago, type 2 diabetes mellitus, cirrhosis of liver-NASH, iron  deficiency anemia presents to us  from home post out-of-hospital cardiac arrest.  According to the husband patient apparently has been feeling unwell and has been having flulike symptoms for the last few days.  She was feeling generally weak she usually uses a walker but has been close to falling.  She was at home and husband heard a third went to see her.  She was not responding with eyes rolling back.  Her husband called her neighbor who is an [employer] who did not find a pulse and immediately started CPR.  After 5 minutes of bystander CPR EMS arrived and they did another 10 minutes of CPR.  The rhythm throughout was not a shockable rhythm it was presumed to be a PEA.  ROSC was achieved and patient was brought into the hospital with a King's airway.  This was exchanged to a ET tube in the emergency room.  In the ER patient was in sinus rhythm EKG showed right bundle branch block no ST T changes.  ABG showed metabolic acidosis and she was hypotensive needing 10 mcg/min of Levophed  when I went to see her.  Rest of the labs and imaging are pending.  Troponin is negative  Bedside POCUS done by me with the four-chamber apical view showed dilated LV with normal RV LV ratio.  There is no pericardial effusion.  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 s/p DES to LAD in 2014 b. cath in 12/2017 showing patent LAD stent with mild to moderate RCA and LCx disease c. cath in 04/2021 showing patent LAD stent with 60-70% RCA stenosis but not significant by FFR   Diverticulosis    Essential hypertension    Fatty liver    Fibromyalgia    GERD (gastroesophageal reflux disease)    H/O hiatal hernia    Heart murmur    IBS (irritable bowel syndrome)    Major depression in partial remission    Migraines    Mixed hyperlipidemia    Myocardial infarction (HCC)    2014   OSA on CPAP    Osteoporosis    Pancreatitis 2008   Pneumonia 1990's   Schatzki's ring    Sleep apnea    Type 2 diabetes mellitus (HCC)      Significant Hospital Events: Including procedures, antibiotic start and stop dates in addition to other pertinent events   08/09/2024.  Out-of-hospital cardiac arrest presented to the ER  Interim History / Subjective:  Decreasing uop. Remains unresponsive.  Objective    Blood pressure (!) 116/34, pulse (!) 101, temperature 99.5 F (37.5 C), resp. rate (!) 27, height 5' 2 (1.575 m), weight 75.4 kg, SpO2 98%.  Vent Mode: PRVC FiO2 (%):  [40 %-80 %] 40 % Set Rate:  [28 bmp] 28 bmp Vt Set:  [400 mL] 400 mL PEEP:  [5 cmH20] 5 cmH20 Plateau Pressure:  [20 cmH20-26 cmH20] 20 cmH20   Intake/Output Summary (Last 24 hours) at 08/11/2024 9160 Last data filed at 08/11/2024 0800 Gross per 24 hour  Intake 3532.81 ml  Output 455 ml  Net 3077.81 ml  425 uop x 24 hrs  Filed Weights   08/10/24 0500 08/11/24 0500  Weight: 76 kg 75.4 kg    Examination: General: Crtitically ill-appearing female, orally intubated HEENT: [place]/AT, eyes anicteric.  ETT and cortrak in place Neuro: Sedated, not following commands.  Eyes are closed.  Both pupils are small, equal and sluggish reaction to light. No corneal reflex on sedation. Chest: Coarse breath sounds, no wheezes or  rhonchi Heart: Regular rate and rhythm, no murmurs or gallops Abdomen: Soft, nondistended, bowel sounds present  Resolved problem list   Assessment and Plan   Neuro Acute encephalopathy Concern for anoxic brain injury Myoclonus Epileptogenicity  -normothermia -Neurology following -EEG, preliminary monitoring with ceribell suggests generalized onset epileptogenicity -propofol  for myoclonus suppression -depakote and keppra  -MRI tomorrow (approximately 72 hrs)  PAD -fentanyl /propofol   Respiratory Acute respiratory failure with hypoxemia -Continue full vent support -LTVV with tidal volumes of 4-8 cc/kg ideal body weight -Goal plateau pressures of 30 and driving pressures of 15 -Wean PEEP/FiO2 for SpO2 92 -Follow intermittent CXR and ABG PRN -VAP bundle  Infectious Pneumonia Influenza A -tamiflu  -zosyn  empirically -discontinue vancomycin   Cardiovascular S/p PEA arrest Circulatory shock Type 2 MI Query hypoxic event leading to PEA arrest. Likely cardiogenic shock post-arrest. Preserved LVEF. Shock state improving throughout day. Wide pulse pressure suggests vasodilatory component. Lactate improving. 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 -transfuse < 7  Renal/metabolic AKI In setting of circulatory shock with ATN. Oliguric. Pressor support for systolic > 100. -in/out monitoring -renally dose meds -avoid nephrotoxins -continue LR 125 cc/h  Hypokalemia Hypophosphatemia Hypomagnesemia -monitor and replace as needed  Lactic acidosis In setting of shock. Improving. -a.m. 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 MRI to evaluate for anoxic brain injury tomorrow. Family hopeful but are well aware of high likelihood of in-hospital death.  Best practice (daily eval):  Diet/type: tubefeeds DVT prophylaxis: prophylactic heparin   GI prophylaxis: H2B Lines: Central line Foley:  Yes, and it is still needed Code Status:  full code Last date of multidisciplinary goals of care discussion [n/a]  Ozell Kung MD 08/11/2024, 8:39 AM  Pager: [phone]      "

## 2024-08-11 NOTE — Progress Notes (Signed)
 NEUROLOGY CONSULT FOLLOW UP NOTE   Date of service: August 11, 2024 Patient Name: [name] MRN:  [id number] DOB:  [birth year]  Interval Hx/subjective   Patient is seen in her room with no family at the bedside. Norepinephrine  off, remains on low dose propofol .  Neurological exam continues to be poor.  Vitals   Vitals:   08/11/24 0745 08/11/24 0800 08/11/24 0803 08/11/24 0815  BP:  (!) 122/35  (!) 116/34  Pulse: 97 100  (!) 101  Resp: (!) 29 (!) 29  (!) 27  Temp: 99.3 F (37.4 C) 99.5 F (37.5 C)  99.5 F (37.5 C)  TempSrc:   Bladder   SpO2: 100% 100%  98%  Weight:      Height:         Body mass index is 30.4 kg/m.  Physical Exam   Constitutional: Intubated elderly patient in no acute distress Eyes: No scleral injection.  HENT: Endotracheal tube in place Head: Normocephalic.  Cardiovascular: Normal rate and regular rhythm.  Respiratory: Respirations synchronous with ventilator Skin: WDI.   Neurologic Examination   (Sedation with low-dose propofol  paused) pupils 2 mm, reactive to light, corneal reflexes absent, oculocephalic reflex absent, cough and gag absent, no response to noxious stimuli, does not respond to name or follow commands  Medications Current Medications[1]  Labs and Diagnostic Imaging   CBC:  Recent Labs  Lab 08/09/24 1545 08/09/24 1612 08/10/24 0400 08/10/24 1708 08/11/24 0345  WBC 87.4*  --  12.4*  --  22.7*  NEUTROABS 10.1*  --   --   --   --   HGB 12.3   < > 10.4* 10.2* 9.8*  HCT 40.1   < > 30.6* 30.0* 29.1*  MCV 104.4*  --  96.8  --  96.0  PLT 155  --  106*  --  112*   < > = values in this interval not displayed.    Basic Metabolic Panel:  Lab Results  Component Value Date   NA 130 (L) 08/11/2024   K 3.8 08/11/2024   CO2 19 (L) 08/11/2024   GLUCOSE 230 (H) 08/11/2024   BUN 29 (H) 08/11/2024   CREATININE 1.69 (H) 08/11/2024   CALCIUM  8.2 (L) 08/11/2024   GFRNONAA 31 (L) 08/11/2024   GFRAA >60 12/29/2017   Lipid Panel:   Lab Results  Component Value Date   LDLCALC 82 12/29/2017   HgbA1c:  Lab Results  Component Value Date   HGBA1C 6.9 (H) 08/09/2024   INR  Lab Results  Component Value Date   INR 1.2 (H) 07/05/2024   APTT  Lab Results  Component Value Date   APTT 28 12/28/2017   CT Head without contrast(Personally reviewed): No acute abnormality  Repeat head CT Subtle loss of gray-white differentiation in the bilateral deep gray nuclei suspicious for anoxic injury-otherwise stable from the prior day  MRI Brain(Personally reviewed): Pending  Continuous EEG 1/21 to 1/22:  - Burst suppression with highly epileptiform bursts, generalized - Burst attenuation, generalized This limited ceribell EEG initially showed evidence of epileptogenicity with generalized onset and increased risk of seizures.  Due to lack of video, unclear if patient had any jerking/seizures associated with epileptiform bursts. Additionally EEG was suggestive of severe to profound diffuse encephalopathy.   Assessment   [name] is a 74 y.o. female with a history of diabetes, hypertension and MI who presented after cardiac arrest with bystander CPR.  Total downtime was about 20 minutes.  She was noted to  have myoclonus afterwards and was placed on Keppra  and valproic  acid.  [facility] EEG was connected, and she will be transition to long-term EEG this morning.  EEG read demonstrates burst suppression with epileptiform bursts.  On exam with propofol  paused, she had no brainstem reflexes and no response to noxious stimuli.  Given prolonged downtime and poor exam, CT was done yesterday that showed subtle gray-white differentiation loss and deep gray matter nuclei.  Prognosis appears poor, so will need to continue goals of care conversation with family. Repeat exam in 72 hours along with possible MRI if needed.  Recommendations  -LTM EEG - Continue Keppra  1000 mg every 12 hours  - May need to lower dose depending on CrCl after  addition of LR - Continue Depakote 500 mg every 12 hours  - Check level today and titrate appropriately - Add Klonopin if necessary for myoclonus - Titrate propofol  to cessation of myoclonus clinically if myoclonus recurs - MRI after 72 hours post arrest if needed. - Neurology will continue to follow  Plan discussed with Dr. Norrine ______________________________________________________________________ Patient seen and examined by NP/APP with MD. MD to update note as needed.   Jorene Last, DNP, FNP-BC [facility] Pager: [phone]  Attending Neurohospitalist Addendum Patient seen and examined with APP/Resident. Agree with the history and physical as documented above. Agree with the plan as documented, which I helped formulate. I have independently reviewed the chart, obtained history, review of systems and examined the patient.I have personally reviewed pertinent head/neck/spine imaging (CT/MRI). Please feel free to call with any questions.  -- Eligio Lav, MD Neurologist [facility] Pager: [phone]  CRITICAL CARE ATTESTATION Performed by: Eligio Lav, MD Total critical care time: 30 minutes Critical care time was exclusive of separately billable procedures and treating other patients and/or supervising APPs/Residents/Students Critical care was necessary to treat or prevent imminent or life-threatening deterioration. This patient is critically ill and at significant risk for neurological worsening and/or death and care requires constant monitoring. Critical care was time spent personally by me on the following activities: development of treatment plan with patient and/or surrogate as well as nursing, discussions with consultants, evaluation of patient's response to treatment, examination of patient, obtaining history from patient or surrogate, ordering and performing treatments and interventions, ordering and review of laboratory studies, ordering  and review of radiographic studies, pulse oximetry, re-evaluation of patient's condition, participation in multidisciplinary rounds and medical decision making of high complexity in the care of this patient.     [1]  Current Facility-Administered Medications:    acetaminophen  (TYLENOL ) 160 MG/5ML solution 500 mg, 500 mg, Per Tube, Q4H PRN, Ogan, Okoronkwo U, MD, 500 mg at 08/10/24 0314   Chlorhexidine  Gluconate Cloth 2 % PADS 6 each, 6 each, Topical, Daily, Mohammed, Shahid, MD, 6 each at 08/10/24 1038   famotidine  (PEPCID ) tablet 10 mg, 10 mg, Per Tube, Daily, McLendon, Michael, MD   feeding supplement (VITAL 1.5 CAL) liquid 1,000 mL, 1,000 mL, Per Tube, Continuous, Norrine Sharper, MD, Last Rate: 20 mL/hr at 08/11/24 0700, Infusion Verify at 08/11/24 0700   fentaNYL  (SUBLIMAZE ) bolus via infusion 25-100 mcg, 25-100 mcg, Intravenous, Q1H PRN, Ogan, Okoronkwo U, MD, 50 mcg at 08/10/24 0136   fentaNYL  in NS (72mcg/ml) infusion-PREMIX, 0-400 mcg/hr, Intravenous, Continuous, Mohammed, Shahid, MD, Stopped at 08/10/24 1047   heparin  injection 5,000 Units, 5,000 Units, Subcutaneous, Q8H, Mohammed, Shahid, MD, 5,000 Units at 08/11/24 0540   hydrALAZINE  (APRESOLINE ) injection 10 mg, 10 mg, Intravenous, Q4H PRN, Ogan, Okoronkwo  U, MD, 10 mg at 08/11/24 0542   insulin  aspart (novoLOG ) injection 0-15 Units, 0-15 Units, Subcutaneous, Q4H, McLendon, Michael, MD, 5 Units at 08/11/24 0758   lactated ringers  infusion, , Intravenous, Continuous, Norrine Sharper, MD   levETIRAcetam  (KEPPRA ) undiluted injection 1,000 mg, 1,000 mg, Intravenous, Q12H, Ogan, Okoronkwo U, MD, 1,000 mg at 08/10/24 2129   norepinephrine  (LEVOPHED ) 4mg  in (0.016 mg/mL) premix infusion, 0-10 mcg/min, Intravenous, Titrated, Ogan, Okoronkwo U, MD, Stopped at 08/10/24 9061   Oral care mouth rinse, 15 mL, Mouth Rinse, Q2H, Mohammed, Shahid, MD, 15 mL at 08/11/24 0732   Oral care mouth rinse, 15 mL, Mouth Rinse, PRN,  Mohammed, Shahid, MD   oseltamivir  (TAMIFLU ) 6 MG/ML suspension 30 mg, 30 mg, Per Tube, BID, Mohammed, Shahid, MD, 30 mg at 08/10/24 2133   piperacillin -tazobactam (ZOSYN ) IVPB 3.375 g, 3.375 g, Intravenous, Once **FOLLOWED BY** piperacillin -tazobactam (ZOSYN ) IVPB 3.375 g, 3.375 g, Intravenous, Q8H, Mohammed, Shahid, MD, Stopped at 08/11/24 9487   polyethylene glycol (MIRALAX  / GLYCOLAX ) packet 17 g, 17 g, Per Tube, Daily PRN, Mohammed, Shahid, MD   propofol  (DIPRIVAN ) 1000 MG/100ML infusion, 0-80 mcg/kg/min (Order-Specific), Intravenous, Continuous, Tegeler, Lonni PARAS, MD, Last Rate: 9.6 mL/hr at 08/11/24 0813, 20 mcg/kg/min at 08/11/24 0813   senna (SENOKOT) tablet 8.6 mg, 1 tablet, Per Tube, BID PRN, Gatha Pence, MD   sodium bicarbonate  injection 100 mEq, 100 mEq, Intravenous, Once, Mohammed, Shahid, MD   thiamine  (VITAMIN B1) tablet 100 mg, 100 mg, Per Tube, Daily, McLendon, Michael, MD, 100 mg at 08/10/24 1514   valproate (DEPACON ) 500 mg in dextrose  5 % 50 mL IVPB, 500 mg, Intravenous, Q12H, Ogan, Okoronkwo U, MD, Stopped at 08/10/24 2245

## 2024-08-11 NOTE — Progress Notes (Cosign Needed)
 Pt. Wedding ring was taken off during bath and given to husband

## 2024-08-12 ENCOUNTER — Inpatient Hospital Stay (HOSPITAL_COMMUNITY)

## 2024-08-12 DIAGNOSIS — E1151 Type 2 diabetes mellitus with diabetic peripheral angiopathy without gangrene: Secondary | ICD-10-CM | POA: Diagnosis not present

## 2024-08-12 DIAGNOSIS — N179 Acute kidney failure, unspecified: Secondary | ICD-10-CM | POA: Diagnosis not present

## 2024-08-12 DIAGNOSIS — R569 Unspecified convulsions: Secondary | ICD-10-CM

## 2024-08-12 DIAGNOSIS — I2510 Atherosclerotic heart disease of native coronary artery without angina pectoris: Secondary | ICD-10-CM | POA: Diagnosis not present

## 2024-08-12 DIAGNOSIS — G40909 Epilepsy, unspecified, not intractable, without status epilepticus: Secondary | ICD-10-CM | POA: Diagnosis not present

## 2024-08-12 DIAGNOSIS — I469 Cardiac arrest, cause unspecified: Secondary | ICD-10-CM | POA: Diagnosis not present

## 2024-08-12 DIAGNOSIS — J9601 Acute respiratory failure with hypoxia: Secondary | ICD-10-CM | POA: Diagnosis not present

## 2024-08-12 DIAGNOSIS — J1000 Influenza due to other identified influenza virus with unspecified type of pneumonia: Secondary | ICD-10-CM | POA: Diagnosis not present

## 2024-08-12 DIAGNOSIS — I21A1 Myocardial infarction type 2: Secondary | ICD-10-CM | POA: Diagnosis not present

## 2024-08-12 DIAGNOSIS — R570 Cardiogenic shock: Secondary | ICD-10-CM | POA: Diagnosis not present

## 2024-08-12 DIAGNOSIS — G931 Anoxic brain damage, not elsewhere classified: Secondary | ICD-10-CM | POA: Diagnosis not present

## 2024-08-12 DIAGNOSIS — G253 Myoclonus: Secondary | ICD-10-CM | POA: Diagnosis not present

## 2024-08-12 LAB — CBC
HCT: 28.5 % — ABNORMAL LOW (ref 36.0–46.0)
Hemoglobin: 9.7 g/dL — ABNORMAL LOW (ref 12.0–15.0)
MCH: 32.6 pg (ref 26.0–34.0)
MCHC: 34 g/dL (ref 30.0–36.0)
MCV: 95.6 fL (ref 80.0–100.0)
Platelets: 88 10*3/uL — ABNORMAL LOW (ref 150–400)
RBC: 2.98 MIL/uL — ABNORMAL LOW (ref 3.87–5.11)
RDW: 14.3 % (ref 11.5–15.5)
WBC: 17.5 10*3/uL — ABNORMAL HIGH (ref 4.0–10.5)
nRBC: 0 % (ref 0.0–0.2)

## 2024-08-12 LAB — COMPREHENSIVE METABOLIC PANEL WITH GFR
ALT: 38 U/L (ref 0–44)
AST: 65 U/L — ABNORMAL HIGH (ref 15–41)
Albumin: 2.7 g/dL — ABNORMAL LOW (ref 3.5–5.0)
Alkaline Phosphatase: 89 U/L (ref 38–126)
Anion gap: 12 (ref 5–15)
BUN: 35 mg/dL — ABNORMAL HIGH (ref 8–23)
CO2: 22 mmol/L (ref 22–32)
Calcium: 8.3 mg/dL — ABNORMAL LOW (ref 8.9–10.3)
Chloride: 99 mmol/L (ref 98–111)
Creatinine, Ser: 1.38 mg/dL — ABNORMAL HIGH (ref 0.44–1.00)
GFR, Estimated: 40 mL/min — ABNORMAL LOW
Glucose, Bld: 310 mg/dL — ABNORMAL HIGH (ref 70–99)
Potassium: 2.9 mmol/L — ABNORMAL LOW (ref 3.5–5.1)
Sodium: 133 mmol/L — ABNORMAL LOW (ref 135–145)
Total Bilirubin: 0.7 mg/dL (ref 0.0–1.2)
Total Protein: 5 g/dL — ABNORMAL LOW (ref 6.5–8.1)

## 2024-08-12 LAB — GLUCOSE, CAPILLARY
Glucose-Capillary: 248 mg/dL — ABNORMAL HIGH (ref 70–99)
Glucose-Capillary: 260 mg/dL — ABNORMAL HIGH (ref 70–99)
Glucose-Capillary: 262 mg/dL — ABNORMAL HIGH (ref 70–99)
Glucose-Capillary: 267 mg/dL — ABNORMAL HIGH (ref 70–99)
Glucose-Capillary: 273 mg/dL — ABNORMAL HIGH (ref 70–99)
Glucose-Capillary: 298 mg/dL — ABNORMAL HIGH (ref 70–99)
Glucose-Capillary: 307 mg/dL — ABNORMAL HIGH (ref 70–99)

## 2024-08-12 LAB — PHOSPHORUS: Phosphorus: 2.4 mg/dL — ABNORMAL LOW (ref 2.5–4.6)

## 2024-08-12 LAB — MAGNESIUM: Magnesium: 2.6 mg/dL — ABNORMAL HIGH (ref 1.7–2.4)

## 2024-08-12 MED ORDER — POTASSIUM CHLORIDE 20 MEQ PO PACK
40.0000 meq | PACK | Freq: Once | ORAL | Status: AC
  Administered 2024-08-12: 40 meq
  Filled 2024-08-12: qty 2

## 2024-08-12 MED ORDER — POTASSIUM CHLORIDE 10 MEQ/50ML IV SOLN
10.0000 meq | INTRAVENOUS | Status: AC
  Administered 2024-08-12 (×4): 10 meq via INTRAVENOUS
  Filled 2024-08-12 (×4): qty 50

## 2024-08-12 MED ORDER — POTASSIUM PHOSPHATES 15 MMOLE/5ML IV SOLN
15.0000 mmol | Freq: Once | INTRAVENOUS | Status: AC
  Administered 2024-08-12: 15 mmol via INTRAVENOUS
  Filled 2024-08-12: qty 5

## 2024-08-12 MED ORDER — FENTANYL BOLUS VIA INFUSION
25.0000 ug | INTRAVENOUS | Status: DC | PRN
  Administered 2024-08-12: 100 ug via INTRAVENOUS

## 2024-08-12 MED ORDER — FUROSEMIDE 10 MG/ML IJ SOLN
40.0000 mg | Freq: Once | INTRAMUSCULAR | Status: AC
  Administered 2024-08-12: 40 mg via INTRAVENOUS

## 2024-08-12 MED ORDER — FENTANYL 2500MCG IN NS 250ML (10MCG/ML) PREMIX INFUSION
0.0000 ug/h | INTRAVENOUS | Status: DC
  Administered 2024-08-12: 50 ug/h via INTRAVENOUS
  Filled 2024-08-12: qty 250

## 2024-08-12 MED ORDER — INSULIN GLARGINE-YFGN 100 UNIT/ML ~~LOC~~ SOLN
20.0000 [IU] | Freq: Every day | SUBCUTANEOUS | Status: DC
  Administered 2024-08-12: 20 [IU] via SUBCUTANEOUS
  Filled 2024-08-12 (×2): qty 0.2

## 2024-08-12 MED ORDER — OSELTAMIVIR PHOSPHATE 6 MG/ML PO SUSR
30.0000 mg | Freq: Two times a day (BID) | ORAL | Status: DC
  Administered 2024-08-12 – 2024-08-13 (×2): 30 mg
  Filled 2024-08-12: qty 12.5
  Filled 2024-08-12: qty 5
  Filled 2024-08-12: qty 12.5

## 2024-08-12 MED ORDER — METOPROLOL TARTRATE 5 MG/5ML IV SOLN: 5.0000 mg | Freq: Once | INTRAVENOUS | Status: DC

## 2024-08-12 MED ORDER — FUROSEMIDE 10 MG/ML IJ SOLN
INTRAMUSCULAR | Status: AC
  Filled 2024-08-12: qty 4

## 2024-08-12 NOTE — Plan of Care (Signed)
 MRI of the brain-completed-personally reviewed.  Formal read pending.  Interpretation of MRI suggestive of global anoxic brain injury. Will recommend having GOC conversations for comfort measures only. Plan relayed to Dr. Geronimo and Dr. Norrine from [facility].   Eligio Lav, MD Neurology

## 2024-08-12 NOTE — Progress Notes (Signed)
 Preliminary impression of MRI is for diffuse anoxic brain injury. Formal radiology impression pending. Unfortunately meaningful recovery is implausible and I conveyed this to husband at bedside. Recommended comfort care measures. Will wait for sons to arrive to bedside so they can be involved in conversation with physician. I'm unsure if they're split about what to do at this stage. I pended comfort care orders in case they withdraw life support measures overnight.

## 2024-08-12 NOTE — Procedures (Signed)
 Patient Name: [name]  MRN: [id number]  Epilepsy Attending: Arlin MALVA Krebs  Referring Physician/Provider: Ogan, Okoronkwo U, MD  Duration: 08/12/2024 1214 to 08/13/2024 1214   Patient history: 74 y.o. female with myoclonus after cardiac arrest. EEG to evaluate for seizure    Level of alertness: comatose   AEDs during EEG study: LEV, VPA, Propofol     Technical aspects: This EEG study was done with scalp electrodes positioned according to the 10-20 International system of electrode placement. Electrical activity was reviewed with band pass filter of 1-70Hz , sensitivity of 7 uV/mm, display speed of 62mm/sec with a 60Hz  notched filter applied as appropriate. EEG data were recorded continuously and digitally stored.  Video monitoring was available and reviewed as appropriate.   Description:  EEG initially showed burst suppression pattern burst of 3-5hz  theta-delta slowing lasting 0.5 seconds. Inter-burst interval increased to about 30 seconds. EEG was disconnected between 08/12/2024 1417 to  1933 for MRI brain. Subsequently EEG showed continuous generalized background suppression. Hyperventilation and photic stimulation were not performed.  ABNORMALITY - Burst suppression, generalized - Background suppression, generalized   IMPRESSION: This study was suggestive of profound diffuse encephalopathy.  No seizures were noted during the study.  EEG appears to be worsening compared to previous day.   Karesha Trzcinski O Mikalah Skyles

## 2024-08-12 NOTE — Progress Notes (Signed)
 eLink Physician-Brief Progress Note Patient Name: [name] DOB: 1950/11/27 MRN: [id number]   Date of Service  08/12/2024  HPI/Events of Note  Afib with rvr.  Sys bp 150's  eICU Interventions  Lopressor  5 mg IV x 1     Intervention Category Major Interventions: Arrhythmia - evaluation and management  CLAUDENE AGENT, P 08/12/2024, 2:13 AM

## 2024-08-12 NOTE — Progress Notes (Signed)
 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  According to the husband patient apparently has been feeling unwell and has been having flulike symptoms for the last few days.  She was feeling generally weak she usually uses a walker but has been close to falling.  She was at home and husband heard a third went to see her.  She was not responding with eyes rolling back.  Her husband called her neighbor who is an [employer] who did not find a pulse and immediately started CPR.  After 5 minutes of bystander CPR EMS arrived and they did another 10 minutes of CPR.  The rhythm throughout was not a shockable rhythm it was presumed to be a PEA.  ROSC was achieved and patient was brought into the hospital with a King's airway.  This was exchanged to a ET tube in the emergency room.  In the ER patient was in sinus rhythm EKG showed right bundle branch block no ST T changes.  ABG showed metabolic acidosis and she was hypotensive needing 10 mcg/min of Levophed  when I went to see her.  Rest of the labs and imaging are pending.  Troponin is negative  Bedside POCUS done by me with the four-chamber apical view showed dilated LV with normal RV LV ratio.  There is no pericardial effusion.  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 s/p DES to LAD in 2014 b. cath in 12/2017 showing patent LAD stent with mild to moderate RCA and LCx disease c. cath in 04/2021 showing patent LAD stent with 60-70% RCA stenosis but not significant by FFR   Diverticulosis    Essential hypertension    Fatty liver    Fibromyalgia    GERD (gastroesophageal reflux disease)    H/O hiatal hernia    Heart murmur    IBS (irritable bowel syndrome)    Major depression in partial remission    Migraines    Mixed hyperlipidemia    Myocardial infarction (HCC)    2014   OSA on CPAP    Osteoporosis    Pancreatitis 2008   Pneumonia 1990's   Schatzki's ring    Sleep apnea    Type 2 diabetes mellitus (HCC)      Significant Hospital Events: Including procedures, antibiotic start and stop dates in addition to other pertinent events   08/09/2024.  Out-of-hospital cardiac arrest presented to the ER  Interim History / Subjective:  A-fib RVR to ~130, metoprolol  5 mg IV x 1 Remains unresponsive  Objective    Blood pressure (!) 141/39, pulse 98, temperature 99 F (37.2 C), resp. rate (!) 26, height 5' 2 (  1.575 m), weight 76.2 kg, SpO2 100%.    Vent Mode: PRVC FiO2 (%):  [40 %] 40 % Set Rate:  [28 bmp] 28 bmp Vt Set:  [400 mL] 400 mL PEEP:  [5 cmH20] 5 cmH20 Plateau Pressure:  [15 cmH20-25 cmH20] 15 cmH20   Intake/Output Summary (Last 24 hours) at 08/12/2024 0832 Last data filed at 08/12/2024 0650 Gross per 24 hour  Intake 3564.16 ml  Output 891 ml  Net 2673.16 ml   Filed Weights   08/10/24 0500 08/11/24 0500 08/12/24 0420  Weight: 76 kg 75.4 kg 76.2 kg    Examination: General: Crtitically ill-appearing female, orally intubated HEENT: [place]/AT, eyes anicteric.  ETT and cortrak in place Neuro: Sedated, not following commands.  Eyes are closed.  Both pupils are small, equal and sluggish reaction to light. No corneal reflex or cough. Chest: unlabored work of breathing on  ventilator Heart: regular tachycardia Abdomen: Soft, nondistended, bowel sounds present  Resolved problem list   Assessment and Plan   Neuro Acute encephalopathy Concern for anoxic brain injury Myoclonus Epileptogenicity  -normothermia -Neurology following -EEG with burst suppression -propofol  for myoclonus suppression -depakote and keppra  -MRI today  PAD -fentanyl /propofol   Respiratory Acute respiratory failure with hypoxemia -Continue full vent support -LTVV with tidal volumes of 4-8 cc/kg ideal body weight -Goal plateau pressures of 30 and driving pressures of 15 -Wean PEEP/FiO2 for SpO2 92 -Follow intermittent CXR and ABG PRN -VAP bundle  Infectious Pneumonia Influenza A -tamiflu  -zosyn  empirically  Cardiovascular S/p PEA arrest Circulatory shock Type 2 MI Query hypoxic event leading to PEA arrest. Likely cardiogenic shock post-arrest. Preserved LVEF. Shock state improving throughout day. Wide pulse pressure suggests vasodilatory component. Lactate improving. 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 -transfuse < 7  Renal/metabolic AKI In setting of circulatory shock with ATN. Oliguric. Pressor support for systolic > 100. -in/out monitoring -renally dose meds -avoid nephrotoxins -discontinue LR 125 cc/h  Hyponatremia Hypokalemia Hypophosphatemia Hypomagnesemia -monitor and replace as needed  Lactic acidosis In setting of shock. Improving.  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 MRI to evaluate for anoxic brain injury.  Family hopeful but are well aware of high likelihood of in-hospital death.  Best practice (daily eval):  Diet/type: tubefeeds DVT prophylaxis: prophylactic heparin   GI prophylaxis: H2B Lines: Central line Foley:  Yes, and it is still needed Code Status:  full code Last date of multidisciplinary goals of care discussion [n/a]  Ozell Kung MD 08/12/2024, 8:32 AM  Pager: [phone]      "

## 2024-08-12 NOTE — Progress Notes (Signed)
Patient transported to MRI and back without complications. RN at bedside. 

## 2024-08-12 NOTE — Procedures (Addendum)
 Patient Name: CAMIYAH FRIBERG  MRN: [id number]  Epilepsy Attending: Arlin MALVA Krebs  Referring Physician/Provider: Ogan, Okoronkwo U, MD  Duration: 08/11/2024 1214 to 08/12/2024 1214   Patient history: 74 y.o. female with myoclonus after cardiac arrest. EEG to evaluate for seizure    Level of alertness: comatose   AEDs during EEG study: LEV, VPA, Propofol     Technical aspects: This EEG study was done with scalp electrodes positioned according to the 10-20 International system of electrode placement. Electrical activity was reviewed with band pass filter of 1-70Hz , sensitivity of 7 uV/mm, display speed of 68mm/sec with a 60Hz  notched filter applied as appropriate. EEG data were recorded continuously and digitally stored.  Video monitoring was available and reviewed as appropriate.   Description:  EEG initially showed burst suppression pattern with high amplitude sharply contoured 3 to 6 Hz theta-delta slowing lasting 0.5 to 1 second alternating with 5-7 seconds of generalized EEG suppression. Gradually the morphology of bursts evolved and became lower in amplitude and less sharply contoured lasting 0.5 seconds. Inter-burst interval increased to about 30 seconds. Hyperventilation and photic stimulation were not performed.      ABNORMALITY - Burst suppression, generalized   IMPRESSION: This study was suggestive of profound diffuse encephalopathy.  No seizures were noted during the study.  EEG appears to be worsening compared to previous day.    Vetta Couzens O Icyss Skog

## 2024-08-12 NOTE — Progress Notes (Addendum)
 Remains hypertensive despite Hydralazine  and increased dosing of Propofol , respirations labored, widened pulse pressure. MD called to bedside. Ordered Fentanyl /Lasix .

## 2024-08-12 NOTE — IPAL (Signed)
" °  Interdisciplinary Goals of Care Family Meeting   Date carried out: 08/12/2024  Location of the meeting: Bedside  Member's involved: Physician, Family Member or next of kin, and Other: Husband and the 2 sons [name] and [employer] Power of Insurance risk surveyor: Husband  Discussion: We discussed goals of care for [name] .  Expressed MRI results of hypoxemia.  Also discussed with [place] before this meeting.  Grim prognosis for neurologic recovery.  This was conveyed.  Code status:   Code Status: Full Code   Disposition: Continue current acute care + they want to talk to neurology tomorrow + they are not ready for even no CPR. + They want to reflect on everything  Time spent for the meeting: 15 min    Dorethia Cave, MD  08/12/2024, 7:11 PM   "

## 2024-08-12 NOTE — Progress Notes (Signed)
 [facility] ADULT ICU REPLACEMENT PROTOCOL   The patient does apply for the [facility] Adult ICU Electrolyte Replacment Protocol based on the criteria listed below:   1.Exclusion criteria: TCTS, ECMO, Dialysis, and Myasthenia Gravis patients 2. Is GFR >/= 30 ml/min? Yes.    Patient's GFR today is 40 3. Is SCr </= 2? Yes.   Patient's SCr is 1.38 mg/dL 4. Did SCr increase >/= 0.5 in 24 hours? No. 5.Pt's weight >40kg  Yes.   6. Abnormal electrolyte(s):   K 2.9  7. Electrolytes replaced per protocol 8.  Call MD STAT for K+ </= 2.5, Phos </= 1, or Mag </= 1 Physician:  DOROTHA Claudene Blackbird R Kirah Stice 08/12/2024 5:54 AM

## 2024-08-12 NOTE — Progress Notes (Signed)
 LTM EEG back on and running post MRI. [facility] aware. Skin breakdown was seen under O2 - electrode moved accordingly.

## 2024-08-12 NOTE — Plan of Care (Signed)
" °  Problem: Fluid Volume: Goal: Ability to maintain a balanced intake and output will improve Outcome: Progressing   Problem: Metabolic: Goal: Ability to maintain appropriate glucose levels will improve Outcome: Progressing   Problem: Nutritional: Goal: Maintenance of adequate nutrition will improve Outcome: Progressing Goal: Progress toward achieving an optimal weight will improve Outcome: Progressing   Problem: Clinical Measurements: Goal: Will remain free from infection Outcome: Progressing Goal: Diagnostic test results will improve Outcome: Progressing Goal: Respiratory complications will improve Outcome: Progressing   Problem: Elimination: Goal: Will not experience complications related to urinary retention Outcome: Progressing   Problem: Pain Managment: Goal: General experience of comfort will improve and/or be controlled Outcome: Progressing   Problem: Safety: Goal: Ability to remain free from injury will improve Outcome: Progressing   Problem: Respiratory: Goal: Ability to maintain a clear airway and adequate ventilation will improve Outcome: Progressing   "

## 2024-08-12 NOTE — Progress Notes (Signed)
 NEUROLOGY CONSULT FOLLOW UP NOTE   Date of service: August 12, 2024 Patient Name: [name] MRN:  [id number] DOB:  1950-07-30  Interval Hx/subjective   Patient is seen in her room with no family at the bedside. Norepinephrine  off, remains on low dose propofol .  Neurological exam continues to be poor. Plan for MRI today around 3pm.   Vitals   Vitals:   08/12/24 0400 08/12/24 0420 08/12/24 0500 08/12/24 0600  BP: (!) 143/45  (!) 155/44 (!) 141/39  Pulse: (!) 108  (!) 107 98  Resp: (!) 26  (!) 26 (!) 26  Temp: 99.1 F (37.3 C)  99.1 F (37.3 C) 99 F (37.2 C)  TempSrc: Bladder     SpO2: 100%  100% 100%  Weight:  76.2 kg    Height:         Body mass index is 30.73 kg/m.  Physical Exam   Constitutional: Intubated elderly patient in no acute distress Eyes: No scleral injection.  HENT: Endotracheal tube in place Head: Normocephalic.  Cardiovascular: Normal rate and regular rhythm.  Respiratory: Respirations synchronous with ventilator Skin: WDI.   Neurologic Examination   (Sedation with low-dose propofol  paused) pupils 2 mm, reactive to light, corneal reflexes absent, oculocephalic reflex absent, cough and gag absent, no response to noxious stimuli, does not respond to name or follow commands. Appears to have some triple flexion in bilateral lower extremities   Medications Current Medications[1]  Labs and Diagnostic Imaging   CBC:  Recent Labs  Lab 08/09/24 1545 08/09/24 1612 08/11/24 0345 08/12/24 0425  WBC 87.4*   < > 22.7* 17.5*  NEUTROABS 10.1*  --   --   --   HGB 12.3   < > 9.8* 9.7*  HCT 40.1   < > 29.1* 28.5*  MCV 104.4*   < > 96.0 95.6  PLT 155   < > 112* 88*   < > = values in this interval not displayed.    Basic Metabolic Panel:  Lab Results  Component Value Date   NA 133 (L) 08/12/2024   K 2.9 (L) 08/12/2024   CO2 22 08/12/2024   GLUCOSE 310 (H) 08/12/2024   BUN 35 (H) 08/12/2024   CREATININE 1.38 (H) 08/12/2024   CALCIUM  8.3 (L)  08/12/2024   GFRNONAA 40 (L) 08/12/2024   GFRAA >60 12/29/2017   Lipid Panel:  Lab Results  Component Value Date   LDLCALC 82 12/29/2017   HgbA1c:  Lab Results  Component Value Date   HGBA1C 6.9 (H) 08/09/2024   INR  Lab Results  Component Value Date   INR 1.2 (H) 07/05/2024   APTT  Lab Results  Component Value Date   APTT 28 12/28/2017   CT Head without contrast(Personally reviewed): No acute abnormality  Repeat head CT Subtle loss of gray-white differentiation in the bilateral deep gray nuclei suspicious for anoxic injury-otherwise stable from the prior day  MRI Brain(Personally reviewed): Pending  Continuous EEG 1/21 to 1/22:  - Burst suppression with highly epileptiform bursts, generalized - Burst attenuation, generalized This limited ceribell EEG initially showed evidence of epileptogenicity with generalized onset and increased risk of seizures.  Due to lack of video, unclear if patient had any jerking/seizures associated with epileptiform bursts. Additionally EEG was suggestive of severe to profound diffuse encephalopathy.   Assessment   DONNIKA KUCHER is a 74 y.o. female with a history of diabetes, hypertension and MI who presented after cardiac arrest with bystander CPR.  Total downtime was  about 20 minutes.  She was noted to have myoclonus afterwards and was placed on Keppra  and valproic  acid.  [facility] EEG was connected, and she will be transition to long-term EEG this morning.  EEG read demonstrates burst suppression with epileptiform bursts.  On exam with propofol  paused, she had no brainstem reflexes and no response to noxious stimuli.  Given prolonged downtime and poor exam, CT was done yesterday that showed subtle gray-white differentiation loss and deep gray matter nuclei.  Prognosis appears poor, so will need to continue goals of care conversation with family. Repeat exam at 72 hours post arrest along with MRI  Recommendations  -LTM EEG - Keppra  at 500mg   BID  - Continue Depakote 500 mg every 8 hours - Add Klonopin if necessary for myoclonus - Titrate propofol  to cessation of myoclonus clinically if myoclonus recurs - MRI after 72 hours post arrest if needed. - Neurology will continue to follow  Plan discussed with Dr. Norrine ______________________________________________________________________ Patient seen and examined by NP/APP with MD. MD to update note as needed.   Jorene Last, DNP, FNP-BC [facility] Pager: [phone]  Attending Neurohospitalist Addendum Patient seen and examined with APP/Resident. Agree with the history and physical as documented above. Agree with the plan as documented, which I helped formulate. I have independently reviewed the chart, obtained history, review of systems and examined the patient.I have personally reviewed pertinent head/neck/spine imaging (CT/MRI).  Plan for MRI today-exam remains poor.  Please feel free to call with any questions.  -- Eligio Lav, MD Neurologist [facility] Pager: [phone]       [1]  Current Facility-Administered Medications:    acetaminophen  (TYLENOL ) 160 MG/5ML solution 500 mg, 500 mg, Per Tube, Q4H PRN, Ogan, Okoronkwo U, MD, 500 mg at 08/11/24 1930   Chlorhexidine  Gluconate Cloth 2 % PADS 6 each, 6 each, Topical, Daily, Mohammed, Shahid, MD, 6 each at 08/11/24 1100   famotidine  (PEPCID ) tablet 10 mg, 10 mg, Per Tube, Daily, McLendon, Michael, MD, 10 mg at 08/11/24 0915   feeding supplement (VITAL 1.5 CAL) liquid 1,000 mL, 1,000 mL, Per Tube, Continuous, Norrine Sharper, MD, Last Rate: 20 mL/hr at 08/12/24 0650, Infusion Verify at 08/12/24 0650   fentaNYL  in NS (36mcg/ml) infusion-PREMIX, 0-400 mcg/hr, Intravenous, Continuous, Mohammed, Shahid, MD, Stopped at 08/10/24 1047   heparin  injection 5,000 Units, 5,000 Units, Subcutaneous, Q8H, Mohammed, Shahid, MD, 5,000 Units at 08/12/24 0535   hydrALAZINE   (APRESOLINE ) injection 10 mg, 10 mg, Intravenous, Q4H PRN, Ogan, Okoronkwo U, MD, 10 mg at 08/11/24 0542   insulin  aspart (novoLOG ) injection 0-15 Units, 0-15 Units, Subcutaneous, Q4H, McLendon, Michael, MD, 8 Units at 08/12/24 0423   insulin  glargine-yfgn (SEMGLEE ) injection 10 Units, 10 Units, Subcutaneous, Daily, McLendon, Michael, MD, 10 Units at 08/11/24 2213   lactated ringers  infusion, , Intravenous, Continuous, Norrine Sharper, MD, Last Rate: 125 mL/hr at 08/12/24 0650, Infusion Verify at 08/12/24 0650   levETIRAcetam  (KEPPRA ) undiluted injection 500 mg, 500 mg, Intravenous, Q12H, Shafer, Devon, NP, 500 mg at 08/11/24 2212   metoprolol  tartrate (LOPRESSOR ) injection 5 mg, 5 mg, Intravenous, Once, Claudene Agent, MD   norepinephrine  (LEVOPHED ) 4mg  in (0.016 mg/mL) premix infusion, 0-10 mcg/min, Intravenous, Titrated, Ogan, Okoronkwo U, MD, Stopped at 08/10/24 9061   Oral care mouth rinse, 15 mL, Mouth Rinse, Q2H, Mohammed, Shahid, MD, 15 mL at 08/12/24 0536   Oral care mouth rinse, 15 mL, Mouth Rinse, PRN, Mohammed, Shahid, MD   oseltamivir  (TAMIFLU ) 6 MG/ML suspension 30 mg,  30 mg, Per Tube, Daily, McLendon, Michael, MD   piperacillin -tazobactam (ZOSYN ) IVPB 3.375 g, 3.375 g, Intravenous, Once **FOLLOWED BY** piperacillin -tazobactam (ZOSYN ) IVPB 3.375 g, 3.375 g, Intravenous, Q8H, Mohammed, Shahid, MD, Last Rate: 12.5 mL/hr at 08/12/24 0818, 3.375 g at 08/12/24 0818   polyethylene glycol (MIRALAX  / GLYCOLAX ) packet 17 g, 17 g, Per Tube, Daily, McLendon, Michael, MD   potassium chloride  10 mEq in 50 mL *CENTRAL LINE* IVPB, 10 mEq, Intravenous, Q1 Hr x 4, Claudene Agent, MD, Last Rate: 50 mL/hr at 08/12/24 0816, 10 mEq at 08/12/24 0816   propofol  (DIPRIVAN ) 1000 MG/100ML infusion, 0-80 mcg/kg/min (Order-Specific), Intravenous, Continuous, Tegeler, Lonni PARAS, MD, Last Rate: 4.8 mL/hr at 08/12/24 0650, 10 mcg/kg/min at 08/12/24 0650   senna (SENOKOT) tablet 8.6 mg, 1 tablet, Per Tube, BID,  Norrine Sharper, MD   sodium bicarbonate  injection 100 mEq, 100 mEq, Intravenous, Once, Mohammed, Shahid, MD   thiamine  (VITAMIN B1) tablet 100 mg, 100 mg, Per Tube, Daily, McLendon, Michael, MD, 100 mg at 08/11/24 0915   valproate (DEPACON ) 500 mg in dextrose  5 % 50 mL IVPB, 500 mg, Intravenous, Q8H, Remi Pippin, NP, Stopped at 08/12/24 [phone]

## 2024-08-13 ENCOUNTER — Inpatient Hospital Stay (HOSPITAL_COMMUNITY)

## 2024-08-13 DIAGNOSIS — J9601 Acute respiratory failure with hypoxia: Secondary | ICD-10-CM | POA: Diagnosis not present

## 2024-08-13 DIAGNOSIS — S062X1A Diffuse traumatic brain injury with loss of consciousness of 30 minutes or less, initial encounter: Secondary | ICD-10-CM | POA: Diagnosis not present

## 2024-08-13 DIAGNOSIS — R569 Unspecified convulsions: Secondary | ICD-10-CM | POA: Diagnosis not present

## 2024-08-13 DIAGNOSIS — I469 Cardiac arrest, cause unspecified: Secondary | ICD-10-CM | POA: Diagnosis not present

## 2024-08-13 DIAGNOSIS — G931 Anoxic brain damage, not elsewhere classified: Secondary | ICD-10-CM | POA: Diagnosis not present

## 2024-08-13 DIAGNOSIS — J1000 Influenza due to other identified influenza virus with unspecified type of pneumonia: Secondary | ICD-10-CM | POA: Diagnosis not present

## 2024-08-13 LAB — COMPREHENSIVE METABOLIC PANEL WITH GFR
ALT: 32 U/L (ref 0–44)
AST: 61 U/L — ABNORMAL HIGH (ref 15–41)
Albumin: 2.7 g/dL — ABNORMAL LOW (ref 3.5–5.0)
Alkaline Phosphatase: 111 U/L (ref 38–126)
Anion gap: 11 (ref 5–15)
BUN: 34 mg/dL — ABNORMAL HIGH (ref 8–23)
CO2: 21 mmol/L — ABNORMAL LOW (ref 22–32)
Calcium: 8.8 mg/dL — ABNORMAL LOW (ref 8.9–10.3)
Chloride: 103 mmol/L (ref 98–111)
Creatinine, Ser: 1.06 mg/dL — ABNORMAL HIGH (ref 0.44–1.00)
GFR, Estimated: 55 mL/min — ABNORMAL LOW
Glucose, Bld: 341 mg/dL — ABNORMAL HIGH (ref 70–99)
Potassium: 4.1 mmol/L (ref 3.5–5.1)
Sodium: 135 mmol/L (ref 135–145)
Total Bilirubin: 0.7 mg/dL (ref 0.0–1.2)
Total Protein: 5.3 g/dL — ABNORMAL LOW (ref 6.5–8.1)

## 2024-08-13 LAB — PHOSPHORUS: Phosphorus: 3.4 mg/dL (ref 2.5–4.6)

## 2024-08-13 LAB — GLUCOSE, CAPILLARY
Glucose-Capillary: 267 mg/dL — ABNORMAL HIGH (ref 70–99)
Glucose-Capillary: 275 mg/dL — ABNORMAL HIGH (ref 70–99)
Glucose-Capillary: 270 mg/dL — ABNORMAL HIGH (ref 70–99)
Glucose-Capillary: 111 mg/dL — ABNORMAL HIGH (ref 70–99)

## 2024-08-13 LAB — CBC
HCT: 30 % — ABNORMAL LOW (ref 36.0–46.0)
Hemoglobin: 10.2 g/dL — ABNORMAL LOW (ref 12.0–15.0)
MCH: 32.4 pg (ref 26.0–34.0)
MCHC: 34 g/dL (ref 30.0–36.0)
MCV: 95.2 fL (ref 80.0–100.0)
Platelets: 103 10*3/uL — ABNORMAL LOW (ref 150–400)
RBC: 3.15 MIL/uL — ABNORMAL LOW (ref 3.87–5.11)
RDW: 14.7 % (ref 11.5–15.5)
WBC: 24.8 10*3/uL — ABNORMAL HIGH (ref 4.0–10.5)
nRBC: 0.2 % (ref 0.0–0.2)

## 2024-08-13 LAB — TRIGLYCERIDES: Triglycerides: 228 mg/dL — ABNORMAL HIGH

## 2024-08-13 LAB — MAGNESIUM: Magnesium: 2.7 mg/dL — ABNORMAL HIGH (ref 1.7–2.4)

## 2024-08-13 MED ORDER — ACETAMINOPHEN 500 MG PO TABS
1000.0000 mg | ORAL_TABLET | Freq: Four times a day (QID) | ORAL | Status: DC | PRN
  Administered 2024-08-13: 1000 mg
  Filled 2024-08-13: qty 2

## 2024-08-13 MED ORDER — MORPHINE SULFATE (PF) 2 MG/ML IV SOLN
2.0000 mg | INTRAVENOUS | Status: DC | PRN
  Administered 2024-08-13 (×3): 2 mg via INTRAVENOUS
  Filled 2024-08-13: qty 2

## 2024-08-13 MED ORDER — MORPHINE 100MG IN NS 100ML (1MG/ML) PREMIX INFUSION
0.0000 mg/h | INTRAVENOUS | Status: DC
  Administered 2024-08-13: 2 mg/h via INTRAVENOUS
  Administered 2024-08-14: 6 mg/h via INTRAVENOUS
  Filled 2024-08-13 (×2): qty 100

## 2024-08-13 MED ORDER — SODIUM CHLORIDE 0.9 % IV SOLN: INTRAVENOUS | Status: DC

## 2024-08-13 MED ORDER — GLYCOPYRROLATE 1 MG PO TABS: 1.0000 mg | ORAL_TABLET | ORAL | Status: DC | PRN

## 2024-08-13 MED ORDER — LABETALOL HCL 5 MG/ML IV SOLN
5.0000 mg | INTRAVENOUS | Status: DC | PRN
  Administered 2024-08-13: 20 mg via INTRAVENOUS
  Filled 2024-08-13 (×2): qty 4

## 2024-08-13 MED ORDER — MIDAZOLAM HCL (PF) 2 MG/2ML IJ SOLN
2.0000 mg | INTRAMUSCULAR | Status: DC | PRN
  Filled 2024-08-13: qty 4

## 2024-08-13 MED ORDER — ZINC OXIDE 40 % EX OINT
1.0000 | TOPICAL_OINTMENT | Freq: Two times a day (BID) | CUTANEOUS | Status: DC
  Administered 2024-08-13: 1 via TOPICAL
  Filled 2024-08-13: qty 57

## 2024-08-13 MED ORDER — POLYVINYL ALCOHOL 1.4 % OP SOLN: 1.0000 [drp] | Freq: Four times a day (QID) | OPHTHALMIC | Status: DC | PRN

## 2024-08-13 MED ORDER — MIDAZOLAM HCL (PF) 2 MG/2ML IJ SOLN
1.0000 mg | INTRAMUSCULAR | Status: DC | PRN
  Administered 2024-08-13: 2 mg via INTRAVENOUS
  Filled 2024-08-13: qty 4

## 2024-08-13 MED ORDER — ACETAMINOPHEN 650 MG RE SUPP: 650.0000 mg | Freq: Four times a day (QID) | RECTAL | Status: DC | PRN

## 2024-08-13 MED ORDER — ACETAMINOPHEN 325 MG PO TABS: 650.0000 mg | ORAL_TABLET | Freq: Four times a day (QID) | ORAL | Status: DC | PRN

## 2024-08-13 MED ORDER — GLYCOPYRROLATE 0.2 MG/ML IJ SOLN: 0.2000 mg | INTRAMUSCULAR | Status: DC | PRN

## 2024-08-13 MED ORDER — GLYCOPYRROLATE 0.2 MG/ML IJ SOLN
0.2000 mg | INTRAMUSCULAR | Status: DC | PRN
  Administered 2024-08-13 (×2): 0.2 mg via INTRAVENOUS
  Filled 2024-08-13: qty 1

## 2024-08-13 NOTE — Procedures (Signed)
 Patient Name: [name]  MRN: [id number]  Epilepsy Attending: Arlin MALVA Krebs  Referring Physician/Provider: Ogan, Okoronkwo U, MD  Duration: 08/13/2024 1214 to 08/13/2024 1518   Patient history: 74 y.o. female with myoclonus after cardiac arrest. EEG to evaluate for seizure    Level of alertness: comatose   AEDs during EEG study: None   Technical aspects: This EEG study was done with scalp electrodes positioned according to the 10-20 International system of electrode placement. Electrical activity was reviewed with band pass filter of 1-70Hz , sensitivity of 7 uV/mm, display speed of 13mm/sec with a 60Hz  notched filter applied as appropriate. EEG data were recorded continuously and digitally stored.  Video monitoring was available and reviewed as appropriate.   Description:  EEG showed continuous generalized background suppression. Hyperventilation and photic stimulation were not performed.   ABNORMALITY - Background suppression, generalized   IMPRESSION: This study was suggestive of profound diffuse encephalopathy.  No seizures were noted during the study.    Elbia Paro O Jerrel Tiberio

## 2024-08-13 NOTE — Progress Notes (Signed)
 NEUROLOGY CONSULT FOLLOW UP NOTE   Date of service: August 13, 2024 Patient Name: [name] MRN:  [id number] DOB:  [birth year]  Interval Hx/subjective   Seen and examined MRI completed yesterday   Vitals   Vitals:   08/13/24 0520 08/13/24 0700 08/13/24 0730 08/13/24 0755  BP: (!) 151/45 (!) 154/48 (!) 151/55   Pulse: (!) 103 (!) 102 (!) 104 (!) 104  Resp: (!) 28 (!) 28 (!) 26 (!) 33  Temp: 99.7 F (37.6 C) 99.7 F (37.6 C) 99.7 F (37.6 C) 99.7 F (37.6 C)  TempSrc:      SpO2: 100% 100% 100% 100%  Weight:      Height:         Body mass index is 31.29 kg/m.  Physical Exam   Constitutional: Intubated elderly patient in no acute distress Eyes: No scleral injection.  HENT: Endotracheal tube in place Head: Normocephalic.  Cardiovascular: Normal rate and regular rhythm.  Respiratory: Respirations synchronous with ventilator Skin: WDI.   Neurologic Examination   On no sedation.  No spontaneous movement.  Pupils sluggishly reactive bilaterally.  Corneal reflex is absent.  Cough and gag present.  Breathing over the ventilator.  To noxious stimulation, no movement.  Medications Current Medications[1]  Labs and Diagnostic Imaging   CBC:  Recent Labs  Lab 08/09/24 1545 08/09/24 1612 08/12/24 0425 08/13/24 0445  WBC 87.4*   < > 17.5* 24.8*  NEUTROABS 10.1*  --   --   --   HGB 12.3   < > 9.7* 10.2*  HCT 40.1   < > 28.5* 30.0*  MCV 104.4*   < > 95.6 95.2  PLT 155   < > 88* 103*   < > = values in this interval not displayed.    Basic Metabolic Panel:  Lab Results  Component Value Date   NA 135 08/13/2024   K 4.1 08/13/2024   CO2 21 (L) 08/13/2024   GLUCOSE 341 (H) 08/13/2024   BUN 34 (H) 08/13/2024   CREATININE 1.06 (H) 08/13/2024   CALCIUM  8.8 (L) 08/13/2024   GFRNONAA 55 (L) 08/13/2024   GFRAA >60 12/29/2017   Lipid Panel:  Lab Results  Component Value Date   LDLCALC 82 12/29/2017   HgbA1c:  Lab Results  Component Value Date   HGBA1C 6.9  (H) 08/09/2024   INR  Lab Results  Component Value Date   INR 1.2 (H) 07/05/2024   APTT  Lab Results  Component Value Date   APTT 28 12/28/2017   CT Head without contrast(Personally reviewed): No acute abnormality  Repeat head CT Subtle loss of gray-white differentiation in the bilateral deep gray nuclei suspicious for anoxic injury-otherwise stable from the prior day  MRI Brain(Personally reviewed): Pending  Continuous EEG 1/24 to 08/13/2024 Burst suppression generalized and generalized background suppression-profound diffuse encephalopathy.  No seizures.  EEG worsened than prior day   Assessment   [name] is a 74 y.o. female with a history of diabetes, hypertension and MI who presented after cardiac arrest with bystander CPR.  Total downtime was about 20 minutes.  She was noted to have myoclonus afterwards and was placed on Keppra  and valproic  acid.  [facility] EEG was connected, and she will be transition to long-term EEG.  EEG read demonstrates burst suppression with epileptiform bursts.   Has had no purposeful responses on exam now for at least the 3 to 4 days that she has been followed by the neurology team. EEG revealed burst suppression  but now also has generalized background suppression and appears to be worse than before. Had an MRI done overnight which I personally reviewed-consistent with diffuse anoxic injury.  Impression: Anoxic brain injury  Recommendations  Given the combined findings of her history, clinical examination, EEG and also MRI-this is compatible with anoxic brain injury. Chances of neurologically meaningful recovery are not grim to none at this time. I have discussed this with Dr. Geronimo. He reports that the patient's family has been kept abreast of the developments and they are looking to speak with me. I will speak with them and update this note with an addendum at that time.    ADDENDUM I called the patient's husband over the phone.  He  reported that he and his sons are coming to the hospital today and would like to speak in person.  I will speak with them and update my discussions    -- Eligio Lav, MD Neurologist [facility] Pager: [phone]   CRITICAL CARE ATTESTATION Performed by: Eligio Lav, MD Total critical care time: 39 minutes Critical care time was exclusive of separately billable procedures and treating other patients and/or supervising APPs/Residents/Students Critical care was necessary to treat or prevent imminent or life-threatening deterioration. This patient is critically ill and at significant risk for neurological worsening and/or death and care requires constant monitoring. Critical care was time spent personally by me on the following activities: development of treatment plan with patient and/or surrogate as well as nursing, discussions with consultants, evaluation of patient's response to treatment, examination of patient, obtaining history from patient or surrogate, ordering and performing treatments and interventions, ordering and review of laboratory studies, ordering and review of radiographic studies, pulse oximetry, re-evaluation of patient's condition, participation in multidisciplinary rounds and medical decision making of high complexity in the care of this patient.     [1]  Current Facility-Administered Medications:    Chlorhexidine  Gluconate Cloth 2 % PADS 6 each, 6 each, Topical, Daily, Mohammed, Shahid, MD, 6 each at 08/12/24 1123   famotidine  (PEPCID ) tablet 10 mg, 10 mg, Per Tube, Daily, McLendon, Michael, MD, 10 mg at 08/12/24 1123   feeding supplement (VITAL 1.5 CAL) liquid 1,000 mL, 1,000 mL, Per Tube, Continuous, McLendon, Michael, MD, Last Rate: 20 mL/hr at 08/13/24 0600, Infusion Verify at 08/13/24 0600   fentaNYL  (SUBLIMAZE ) bolus via infusion 25-100 mcg, 25-100 mcg, Intravenous, Q15 min PRN, McLendon, Michael, MD, 100 mcg at 08/12/24 0920   fentaNYL  in  NS (11mcg/ml) infusion-PREMIX, 0-400 mcg/hr, Intravenous, Continuous, Norrine Sharper, MD, Stopped at 08/12/24 1452   heparin  injection 5,000 Units, 5,000 Units, Subcutaneous, Q8H, Mohammed, Shahid, MD, 5,000 Units at 08/13/24 9487   hydrALAZINE  (APRESOLINE ) injection 10 mg, 10 mg, Intravenous, Q4H PRN, Ogan, Okoronkwo U, MD, 10 mg at 08/13/24 0906   insulin  aspart (novoLOG ) injection 0-15 Units, 0-15 Units, Subcutaneous, Q4H, McLendon, Michael, MD, 8 Units at 08/13/24 9094   insulin  glargine-yfgn (SEMGLEE ) injection 20 Units, 20 Units, Subcutaneous, Daily, Ramaswamy, Dorethia, MD, 20 Units at 08/12/24 2225   labetalol  (NORMODYNE ) injection 5-20 mg, 5-20 mg, Intravenous, Q2H PRN, Geronimo Dorethia, MD   liver oil-zinc  oxide (DESITIN) 40 % ointment 1 Application, 1 Application, Topical, BID, Ramaswamy, Murali, MD   Oral care mouth rinse, 15 mL, Mouth Rinse, Q2H, Mohammed, Shahid, MD, 15 mL at 08/13/24 0901   oseltamivir  (TAMIFLU ) 6 MG/ML suspension 30 mg, 30 mg, Per Tube, BID, Ramaswamy, Murali, MD, 30 mg at 08/12/24 2225   piperacillin -tazobactam (ZOSYN ) IVPB 3.375 g, 3.375 g,  Intravenous, Once **FOLLOWED BY** piperacillin -tazobactam (ZOSYN ) IVPB 3.375 g, 3.375 g, Intravenous, Q8H, Mohammed, Shahid, MD, Last Rate: 12.5 mL/hr at 08/13/24 0909, 3.375 g at 08/13/24 9090   polyethylene glycol (MIRALAX  / GLYCOLAX ) packet 17 g, 17 g, Per Tube, Daily, McLendon, Michael, MD   propofol  (DIPRIVAN ) 1000 MG/100ML infusion, 0-80 mcg/kg/min (Order-Specific), Intravenous, Continuous, Tegeler, Lonni PARAS, MD, Last Rate: 4.8 mL/hr at 08/13/24 0600, 10 mcg/kg/min at 08/13/24 0600   senna (SENOKOT) tablet 8.6 mg, 1 tablet, Per Tube, BID, McLendon, Michael, MD   thiamine  (VITAMIN B1) tablet 100 mg, 100 mg, Per Tube, Daily, McLendon, Michael, MD, 100 mg at 08/12/24 1124

## 2024-08-13 NOTE — Consult Note (Signed)
" °  CLINICAL SUPPORT TEAM - WOUND OSTOMY AND CONTINENCE TEAM  CONSULTATION SERVICES   WOC Nurse-Inpatient Note   Reason for Consult:deep tissue pressure injury   Wound type: Deep tissue Pressure Injury sacrum  Pressure Injury POA: no, admitted 1/21 no photo, placed on flowsheet 1/24  Measurement: see nursing flowsheet  Wound bed: purple maroon discoloration drainage (amount, consistency, odor)  Periwound: erythema noted to buttocks  Dressing procedure/placement/frequency: Cleanse sacral wound with NS, apply silver hydrofiber ([id number]) to wound bed daily and secure with silicone foam.   Will write for a thin layer of Desitin to buttocks 2 times daily and prn soiling.   POC discussed with bedside nurse. WOC team will follow every 7 to 10 days to assess area and change POC as needed.   Thank you,    Asiel Chrostowski MSN, RN-BC, CWOCN     "

## 2024-08-13 NOTE — Progress Notes (Addendum)
" ° °  ASked b RN to eval gurgling  This is death rattle  - no speciic Rx  Prognosis < 24h -family updated      SIGNATURE    Dr. Dorethia Cave, M.D., F.C.C.P,  Pulmonary and Critical Care Medicine Staff Physician, [facility] Director - Interstitial Lung Disease  Program  [facility] at [facility] [place], [place], [address]   Pager: [phone], If no answer  -> Check AMION or Try [phone] Telephone (clinical office): [phone] Telephone (research): [phone]  7:07 PM 08/13/2024  "

## 2024-08-13 NOTE — Progress Notes (Signed)
 "  NAME:  [name], MRN:  [id number], DOB:  [birth year], LOS: 4 ADMISSION DATE:  08/09/2024, CONSULTATION DATE:  08/09/2024 CHIEF COMPLAINT:  Cardiac Arrest   History of Present Illness:  This 74 year old woman has a history of CLL(not on treatment), coronary artery disease with a stent placed a decade ago, type 2 diabetes mellitus, cirrhosis of liver-NASH, iron  deficiency anemia presents to us  from home post out-of-hospital cardiac arrest.  According to the husband patient apparently has been feeling unwell and has been having flulike symptoms for the last few days.  She was feeling generally weak she usually uses a walker but has been close to falling.  She was at home and husband heard a third went to see her.  She was not responding with eyes rolling back.  Her husband called her neighbor who is an [employer] who did not find a pulse and immediately started CPR.  After 5 minutes of bystander CPR EMS arrived and they did another 10 minutes of CPR.  The rhythm throughout was not a shockable rhythm it was presumed to be a PEA.  ROSC was achieved and patient was brought into the hospital with a King's airway.  This was exchanged to a ET tube in the emergency room.  In the ER patient was in sinus rhythm EKG showed right bundle branch block no ST T changes.  ABG showed metabolic acidosis and she was hypotensive needing 10 mcg/min of Levophed  when I went to see her.  Rest of the labs and imaging are pending.  Troponin is negative  Bedside POCUS done by me with the four-chamber apical view showed dilated LV with normal RV LV ratio.  There is no pericardial effusion.  Pertinent  Medical History    has a past medical history of Anemia, Anxiety, Arthritis, Asthma, Barrett esophagus, Blood transfusion without reported diagnosis, Cataract, Chronic bronchitis (HCC), Chronic diarrhea, Chronic kidney disease, Chronic lower back pain, CLL (chronic lymphocytic leukemia) (HCC) (01/11/2022), Coronary atherosclerosis of  native coronary artery, Diverticulosis, Essential hypertension, Fatty liver, Fibromyalgia, GERD (gastroesophageal reflux disease), H/O hiatal hernia, Heart murmur, IBS (irritable bowel syndrome), Major depression in partial remission, Migraines, Mixed hyperlipidemia, Myocardial infarction (HCC), OSA on CPAP, Osteoporosis, Pancreatitis (2008), Pneumonia (1990's), Schatzki's ring, Sleep apnea, and Type 2 diabetes mellitus (HCC).   has a past surgical history that includes Toe Fusion (Right, 04/19/2010); Colonoscopy (04/19/2012); Cholecystectomy (03/20/1989); Appendectomy (?1987); Abdominal hysterectomy (?1987); Carpal tunnel release (Bilateral, ?1990); Cataract extraction w/ intraocular lens  implant, bilateral (Bilateral, ?2005); Humerus fracture surgery (Right, 07/19/1969); Bunionectomy (Bilateral, 07/20/1970); Toe Surgery (Right, 03/20/1989); Nasal septum surgery (03/20/1969); Dilation and curettage of uterus (03/20/1969); Tubal ligation (07/21/1983); Cesarean section (07/21/1983); ERCP (07/20/2006); Esophageal dilation (` 2012); percutaneous coronary stent intervention (pci-s) (N/A, 12/09/2012); Esophageal manometry (N/A, 10/21/2015); LEFT HEART CATH AND CORONARY ANGIOGRAPHY (N/A, 12/29/2017); Esophageal manometry (N/A, 02/16/2018); PH impedance study (N/A, 02/16/2018); 24 hour ph study (N/A, 02/16/2018); Esophagogastroduodenoscopy (Left, 03/02/2021); biopsy (03/02/2021); LEFT HEART CATH AND CORONARY ANGIOGRAPHY (N/A, 05/12/2021); CORONARY PRESSURE/FFR STUDY (N/A, 05/12/2021); Colonoscopy with propofol  (N/A, 08/28/2021); and Upper gastrointestinal endoscopy.   Significant Hospital Events: Including procedures, antibiotic start and stop dates in addition to other pertinent events   08/09/2024.  Out-of-hospital cardiac arrest presented to the ER A-fib RVR to ~130, metoprolol  5 mg IV x 1. Remains unresponsive. MR with anoxia  Interim History / Subjective:    08/27/2024: ComatoseGets tacypnice and hypetensive  without diprivan  gtt. On TF. Comatose, And Unrespnsive.  PEr DR Shelton: EEG is flat. MRIw with diffuse  hypoxemia.  . Goals of care done by njeuro -> Hsuband and sons later told PCCM -  they wish to provide with comfort care. PEr RN: currently no myoclonus off diprivan  but gets very tachypneic and hypertensive  Objective    Blood pressure (!) 123/38, pulse 79, temperature 99.7 F (37.6 C), resp. rate (!) 26, height 5' 2 (1.575 m), weight 77.6 kg, SpO2 99%.    Vent Mode: PRVC FiO2 (%):  [40 %] 40 % Set Rate:  [28 bmp] 28 bmp Vt Set:  [400 mL] 400 mL PEEP:  [5 cmH20] 5 cmH20 Plateau Pressure:  [22 cmH20-28 cmH20] 23 cmH20   Intake/Output Summary (Last 24 hours) at 08/13/2024 1354 Last data filed at 08/13/2024 1301 Gross per 24 hour  Intake 1249.02 ml  Output 950 ml  Net 299.02 ml   Filed Weights   08/11/24 0500 08/12/24 0420 08/13/24 0500  Weight: 75.4 kg 76.2 kg 77.6 kg    Examination: General Appearance:  Looks criticall ill OBESE - yes Head:  Normocephalic, without obvious abnormality, atraumatic Eyes:  PERRL - non reactive, conjunctiva/corneas - mudd     Ears:  Normal external ear canals, both ears Nose:  G tube - yes Throat:  ETT TUBE - yes , OG tube - no Neck:  Supple,  No enlargement/tenderness/nodules Lungs: Clear to auscultation bilaterally, Ventilat sybnc _+ YES on diprivan  gtt Heart:  S1 and S2 normal, no murmur, CVP - no.  Pressors - no Abdomen:  Soft, no masses, no organomegaly Genitalia / Rectal:  Not done Extremities:  Extremities- intact Skin:  ntact in exposed areas . Sacral area - nog examined Neurologic:  Sedation  on very low dose diprivan  gtt -> RASS - -5 . Moves all 4s - no. CAM-ICU - no . Orientation - NOT   LABS    PULMONARY Recent Labs  Lab 08/09/24 1612 08/09/24 1741 08/09/24 1920 08/09/24 2157 08/10/24 1708  PHART  --  7.184* 7.254* 7.284* 7.300*  PCO2ART  --  49.0* 43.7 38.8 41.0  PO2ART  --  407* 396* 316* 233*  HCO3  --  18.9* 19.8*  18.7* 20.2  TCO2 19* 21* 21* 20* 21*  O2SAT  --  100 100 100 100    CBC Recent Labs  Lab 08/11/24 0345 08/12/24 0425 08/13/24 0445  HGB 9.8* 9.7* 10.2*  HCT 29.1* 28.5* 30.0*  WBC 22.7* 17.5* 24.8*  PLT 112* 88* 103*    COAGULATION No results for input(s): INR in the last 168 hours.  CARDIAC  No results for input(s): TROPONINI in the last 168 hours. Recent Labs  Lab 08/09/24 1545  PROBNP 1,150.0*     CHEMISTRY Recent Labs  Lab 08/09/24 1545 08/09/24 1612 08/09/24 2055 08/09/24 2157 08/10/24 0400 08/10/24 1708 08/11/24 0345 08/12/24 0425 08/13/24 0445  NA 131*   < > 129*   < > 133* 131* 130* 133* 135  K 4.5   < > 4.2   < > 3.4* 3.7 3.8 2.9* 4.1  CL 97*   < > 96*  --  99  --  97* 99 103  CO2 13*  --  20*  --  19*  --  19* 22 21*  GLUCOSE 252*   < > 278*  --  196*  --  230* 310* 341*  BUN 14   < > 16  --  18  --  29* 35* 34*  CREATININE 0.95   < > 0.75  --  1.06*  --  1.69*  1.38* 1.06*  CALCIUM  8.2*  --  8.1*  --  8.1*  --  8.2* 8.3* 8.8*  MG 1.8  --   --   --  1.4*  --  2.4 2.6* 2.7*  PHOS  --   --   --   --  1.9*  --  3.8 2.4* 3.4   < > = values in this interval not displayed.   Estimated Creatinine Clearance: 44.2 mL/min (A) (by C-G formula based on SCr of 1.06 mg/dL (H)).   LIVER Recent Labs  Lab 08/09/24 1545 08/10/24 0400 08/11/24 0345 08/12/24 0425 08/13/24 0445  AST 68* 52* 60* 65* 61*  ALT 35 29 32 38 32  ALKPHOS 89 64 86 89 111  BILITOT 0.7 0.7 0.8 0.7 0.7  PROT 6.4* 5.3* 5.2* 5.0* 5.3*  ALBUMIN 3.7 3.1* 3.0* 2.7* 2.7*     INFECTIOUS Recent Labs  Lab 08/10/24 0400 08/11/24 0345 08/11/24 0753  LATICACIDVEN 4.2* 3.5* 2.9*     ENDOCRINE CBG (last 3)  Recent Labs    08/13/24 0328 08/13/24 0800 08/13/24 1117  GLUCAP 267* 275* 270*         IMAGING x48h  - image(s) personally visualized  -   highlighted in bold MR BRAIN WO CONTRAST Result Date: 08/12/2024 EXAM: MRI BRAIN WITHOUT CONTRAST 08/12/2024 05:26:48 PM  TECHNIQUE: Multiplanar multisequence MRI of the head/brain was performed without the administration of intravenous contrast. COMPARISON: CT head 06/20/2025. CLINICAL HISTORY: Anoxic brain damage. FINDINGS: BRAIN AND VENTRICLES: Diffuse diffusion signal abnormality and restricted diffusion throughout the cortex of the cerebral hemispheres, most pronounced in the occipital lobes and parietal lobes as well as the posterior temporal lobes. There are additional areas of restricted diffusion involving the deep gray matter, particularly the caudate and lentiform nuclei. Areas of diffusion signal abnormality are associated with mild edema. Additional diffusion signal abnormality in the bilateral cerebellum. T2 and FLAIR hyperintensity in the periventricular and subcortical white matter likely reflecting chronic microvascular ischemic changes. No intracranial hemorrhage. No mass. No midline shift. No hydrocephalus. The sella is unremarkable. Normal flow voids. ORBITS: Bilateral lens replacement. SINUSES AND MASTOIDS: Mucosal thickening throughout the paranasal sinuses. There are air fluid levels in the bilateral sphenoid sinuses. Large bilateral mastoid effusions. BONES AND SOFT TISSUES: Normal marrow signal. Partially visualized endotracheal tube and enteric tube. IMPRESSION: 1. Diffuse signal abnormality in the cerebral cortex and deep gray matter consistent with diffuse hypoxic-ischemic brain injury. Mild cerebral edema. No midline shift. Electronically signed by: Donnice Mania MD 08/12/2024 07:42 PM EST RP Workstation: [id number]   DG CHEST PORT 1 VIEW Result Date: 08/12/2024 EXAM: 1 VIEW XRAY OF THE CHEST 08/12/2024 09:27:07 AM COMPARISON: 08/09/2024 CLINICAL HISTORY: Acute hypoxic respiratory failure. FINDINGS: LINES, TUBES AND DEVICES: Endotracheal tube in place with tip 3.5 cm above carina. Gastric tube extends at least as far as the stomach, tip not seen. Left IJ central line to the distal SVC. LUNGS AND PLEURA:  Worsening interstitial opacity with worsening left greater than right airspace opacities especially in the perihilar regions, cannot exclude pneumonia or aspiration pneumonitis. Asymmetric edema is a differential diagnostic consideration. No pleural effusion. No pneumothorax. HEART AND MEDIASTINUM: Left heart border is obscured. Aortic atherosclerosis. BONES AND SOFT TISSUES: No acute osseous abnormality. IMPRESSION: 1. Worsening interstitial opacity with left greater than right perihilar airspace opacities, with considerations including pneumonia, aspiration pneumonitis, or asymmetric pulmonary edema; left heart border is obscured. 2. Endotracheal tube tip 3.5 cm above the carina; gastric tube courses to at least  the stomach with tip not visualized; left internal jugular central venous catheter tip in the distal superior vena cava. 3. Aortic atherosclerosis. Electronically signed by: Ryan Salvage MD 08/12/2024 03:54 PM EST RP Workstation: [id number]      Resolved problem list   Assessment and Plan  Acute respiratory failure with hypoxemia secondary to influenza pneumonia  PEA arrest circulatory shock at admission history of coronary artery disease, hyperlipidemia, CLL  NASH cirrhosis with grade 1 esophageal varices  Acute encephalopathy c/w  anoxic brain injury Myoclonus Epileptogenicity   08/13/2024: Deterioration in neurologic status.  MRI with significant hypoxemia.  According to neurology EEG is now flatline.  She does get tachypneic and hypertensive off propofol  (15 mcg]propofol  but not having the myoclonus anymore.  Plan - Continue propofol  max dose 20mcg -Start morphine  infusion to control respiratory distress -Aim for terminal wean  = Goals of care  TERMINAL WEAN DISCUSSION  Explained concept of terminal wean  Explained how terminal wean works at the bedside Explained MD, RN, and RT role in this Explained that we are only stopping medicines and therapies  that are not  effective and are only prolonging her suffering Explained that vent and pressors are not consistent with her goals Explained that we are still caring for patient by providing care aimed at comfort, agony, pain, distress  Explained post terminal wean we allow nature to take course    Explain predicting death post terminal is unpredictable but in patient [name] with [birth year] and [address] [place] [address] . However, likely to happen in minutes   Explained they can time the event based on personal needs and family/friends to gather  Explained that medicines used for comfort are morphine  and benzo and explained the doctrine of double effect  They are appreciative and will time terminal wean later on 08/13/2024  when family gather  Get chaplain first for prayer .  Start morphine  infusion due to the visible respiratory distress and hypotension - Continue propofol  low-dose [discussed with Dr. Jenelle Manor of [facility])       ATTESTATION & SIGNATURE   The patient [name] is critically ill with multiple organ systems failure and requires high complexity decision making for assessment and support, frequent evaluation and titration of therapies, application of advanced monitoring technologies and extensive interpretation of multiple databases and discussion with other appropriate health care personnel such as bedside nurses, social workers, case production designer, theatre/television/film, consultants, respiratory therapists, nutritionists, secretaries etc.,  Critical care time includes but is not restricted to just documentation time. Documentation can happen in parallel or sequential to care time depending on case mix urgency and priorities for the shift. So, overall critical Care Time devoted to patient care services described in this note is  30  Minutes.   This time reflects time of care of this signee Dr Dorethia Cave which includ does not reflect procedure time, or teaching time or supervisory time of  PA/NP/Med student/Med Resident etc but could involve care discussion time     Dr. Dorethia Cave, M.D., [facility]C.P Pulmonary and Critical Care Medicine Staff Physician,  System [place] [facility] Pager: [phone], If no answer or between  15:00h - 7:00h: call [phone]  08/13/2024 2:05 PM     "

## 2024-08-13 NOTE — Progress Notes (Signed)
vLTM discontinued  [facility] notified.  No skin breakdown noted at FP1  FP2  A1  A2

## 2024-08-13 NOTE — Progress Notes (Signed)
 Pt resting comfortable family at bedside and on Elink camera

## 2024-08-13 NOTE — Progress Notes (Signed)
 Extubated for comfort care with family present.

## 2024-08-13 NOTE — Progress Notes (Signed)
 vLTM maintenance  All impedances below 10k  No skin breakdown noted at A1 A2 T3 T5

## 2024-08-13 NOTE — Progress Notes (Signed)
 SPIRITUAL CARE AND COUNSELING CONSULT NOTE   VISIT SUMMARY Chaplain responded to family request as pt [name] transitions into comfort care. Husband Madeleine and sons Camellia and Warden bedside. Chaplain provided presence and prayer as requested, and then coordinated with RN. Chaplains continue to remain available.  SPIRITUAL ENCOUNTER                                                                                                                                                                      Type of Visit: Initial Care provided to:: Pt and family Conversation partners present during encounter: Nurse Referral source: Family Reason for visit: End-of-life OnCall Visit: Yes  INTERVENTIONS   Spiritual Care Interventions Made: Established relationship of care and support, Compassionate presence, Prayer, Supported grief process   If immediate needs arise, please contact [place] 24 hour on call [phone]   Donnice JINNY Shuck, Chaplain  08/13/2024 2:54 PM

## 2024-08-14 LAB — CULTURE, BLOOD (ROUTINE X 2)
Culture: NO GROWTH
Culture: NO GROWTH

## 2024-08-15 ENCOUNTER — Ambulatory Visit

## 2024-08-17 LAB — NEURON-SPECIFIC ENOLASE(NSE), BLOOD: Neuron-specific Enolase, Serum: 59.4 ng/mL — ABNORMAL HIGH (ref 0.0–17.6)

## 2024-08-20 NOTE — Progress Notes (Signed)
 Nutrition Brief Note  Chart reviewed. Pt now transitioning to comfort care.  No further nutrition interventions planned at this time.  Please re-consult as needed.   Vernell Lukes, RD, LDN, CNSC Registered Dietitian II Please reach out via secure chat

## 2024-08-20 NOTE — Progress Notes (Signed)
 SPIRITUAL CARE AND COUNSELING CONSULT NOTE   VISIT SUMMARY Chaplain attempted visit with patient and family. Hadar had already passed away around 7:30am. No family present. Chaplain presence and support offered to medical staff. Chaplain services available by page and consult as needed.   SPIRITUAL ENCOUNTER                                                                                                                                                                      Type of Visit: Follow up Referral source: Chaplain team Reason for visit: End-of-life OnCall Visit: No    If immediate needs arise, please contact [place] 24 hour on call [phone]   Roxanne Slade  19-Aug-2024 11:43 AM

## 2024-08-20 NOTE — Discharge Summary (Signed)
 "        DISCHARGE SUMMARY    Date of admit: 08/09/2024  3:44 PM Date of discharge: 09/07/2024 10:49 AM Length of Stay: 5 days  PCP is Toribio Jerel MATSU, MD  CAUSE(S) OF DEATH  Influenza A   PROBLEM LIST Principal Problem:   Cardiac arrest [facility]) Active Problems:   Seizure [facility])    Present on Admission Present on Admission:  Cardiac arrest [facility])   Active Problems Principal Problem:   Cardiac arrest [facility]) Active Problems:   Seizure [facility])   Resolved Problems Active Hospital Problems   Diagnosis Date Noted   Cardiac arrest (HCC) 08/09/2024   Seizure (HCC) 08/12/2024    Resolved Hospital Problems  No resolved problems to display.     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 patient with    has a past medical history of Anemia, Anxiety, Arthritis, Asthma, Barrett esophagus, Blood transfusion without reported diagnosis, Cataract, Chronic bronchitis (HCC), Chronic  diarrhea, Chronic kidney disease, Chronic lower back pain, CLL (chronic lymphocytic leukemia) (HCC) (01/11/2022), Coronary atherosclerosis of native coronary artery, Diverticulosis, Essential hypertension, Fatty liver, Fibromyalgia, GERD (gastroesophageal reflux disease), H/O hiatal hernia, Heart murmur, IBS (irritable bowel syndrome), Major depression in partial remission, Migraines, Mixed hyperlipidemia, Myocardial infarction (HCC), OSA on CPAP, Osteoporosis, Pancreatitis (2008), Pneumonia (1990's), Schatzki's ring, Sleep apnea, and Type 2 diabetes mellitus (HCC).   has a past surgical history that includes Toe Fusion (Right, 04/19/2010); Colonoscopy (04/19/2012); Cholecystectomy (03/20/1989); Appendectomy (?1987); Abdominal hysterectomy (?1987); Carpal tunnel release (Bilateral, ?1990); Cataract extraction w/ intraocular lens  implant, bilateral (Bilateral, ?2005); Humerus fracture surgery (Right, 07/19/1969); Bunionectomy (Bilateral, 07/20/1970); Toe Surgery (Right, 03/20/1989); Nasal septum surgery (03/20/1969); Dilation and curettage of uterus (03/20/1969); Tubal ligation (07/21/1983); Cesarean section (07/21/1983); ERCP (07/20/2006); Esophageal dilation (` 2012); percutaneous coronary stent intervention (pci-s) (N/A, 12/09/2012); Esophageal manometry (N/A, 10/21/2015); LEFT HEART CATH AND CORONARY ANGIOGRAPHY (N/A, 12/29/2017); Esophageal manometry (N/A, 02/16/2018); PH impedance study (N/A, 02/16/2018); 24 hour ph study (N/A, 02/16/2018); Esophagogastroduodenoscopy (Left, 03/02/2021); biopsy (03/02/2021); LEFT HEART CATH AND CORONARY ANGIOGRAPHY (N/A, 05/12/2021); CORONARY PRESSURE/FFR STUDY (N/A, 05/12/2021); Colonoscopy with propofol  (N/A, 08/28/2021); and Upper gastrointestinal endoscopy.   Admitted on 08/09/2024 with   This 74 year old woman has a history of CLL(not on treatment), coronary artery disease with a stent placed a decade ago, type 2 diabetes mellitus, cirrhosis of liver-NASH, iron   deficiency anemia presents to us  from home post out-of-hospital cardiac arrest.  According to the husband patient apparently has been feeling unwell and has been having flulike symptoms for the last few days.  She was feeling generally weak she usually uses a walker but has been close to falling.  She was at home and husband heard a third went to see her.  She was not responding with eyes rolling back.  Her husband called her neighbor who is an [employer] who did not find a pulse and immediately started CPR.  After 5 minutes of bystander CPR EMS arrived and they did another 10 minutes of CPR.  The rhythm throughout was not a shockable rhythm it was presumed to be a PEA.  ROSC was achieved and patient was brought into the hospital with a King's airway.  This was exchanged to a ET tube in the emergency room.   In the ER patient was in sinus rhythm EKG showed right bundle branch block no ST T changes.  ABG showed metabolic acidosis and she was hypotensive needing 10 mcg/min of Levophed  when I went to see her.  Rest of the labs and imaging are pending.  Troponin is negative   Bedside POCUS done by me with the four-chamber apical view showed dilated LV with normal RV LV ratio.  There is no pericardial effusion.   COURSE   08/09/2024.  Out-of-hospital cardiac arrest presented to the ER -> influenza A positive. A-fib RVR to ~130, metoprolol  5 mg IV x 1. Remains unresponsive. MR with anoxia 08/11/2024: Remains unresponsive.  EEG with profound diffuse encephalopathy  08/12/24: Preliminary impression of MRI is for diffuse anoxic brain injury. Formal radiology impression pending. Unfortunately meaningful recovery is implausible and I conveyed this to husband at bedside. Recommended comfort care measures. Will wait for sons to arrive to bedside so they can be involved in conversation with physician. I'm unsure if they're split about what to do at this stage. I pended comfort care orders in case they withdraw life support  measures overnight.  08/13/24: : ComatoseGets tacypnice and hypetensive without diprivan  gtt. On TF. Comatose, And Unrespnsive.  PEr DR Shelton: EEG is flat. MRIw with diffuse hypoxemia.  . Goals of care done by njeuro -> Hsuband and sons later told PCCM -  they wish to provide with comfort care. PEr RN: currently no myoclonus off diprivan  but gets very tachypneic and hypertensive  Goals of care held by critical care and by neurology.  Family decided to make patient comfortable.  Patient was extubated for palliative wean.  And patient then passed away.  Cause of death is influenza A          SIGNED Dr. Dorethia Cave, M.D., [facility].C.P Pulmonary and Critical Care Medicine Staff Physician [facility] [place] [facility] Pager: [phone], If no answer or between  15:00h - 7:00h: call [phone]  08/15/2024 11:22 AM     "

## 2024-08-20 DEATH — deceased

## 2024-08-22 ENCOUNTER — Ambulatory Visit

## 2024-09-08 ENCOUNTER — Inpatient Hospital Stay: Admitting: Hematology and Oncology

## 2024-09-08 ENCOUNTER — Inpatient Hospital Stay
